# Patient Record
Sex: Female | Born: 1945 | Race: White | Hispanic: No | Smoking: Former smoker
Health system: Southern US, Community
[De-identification: ages and names within clinical notes are randomized; demographics above are authoritative.]

## PROBLEM LIST (undated history)

## (undated) DIAGNOSIS — I10 Essential (primary) hypertension: Secondary | ICD-10-CM

## (undated) DIAGNOSIS — E7800 Pure hypercholesterolemia, unspecified: Secondary | ICD-10-CM

## (undated) DIAGNOSIS — F329 Major depressive disorder, single episode, unspecified: Secondary | ICD-10-CM

## (undated) DIAGNOSIS — I482 Chronic atrial fibrillation, unspecified: Secondary | ICD-10-CM

## (undated) DIAGNOSIS — I639 Cerebral infarction, unspecified: Secondary | ICD-10-CM

## (undated) DIAGNOSIS — F32A Depression, unspecified: Secondary | ICD-10-CM

## (undated) HISTORY — PX: CHOLECYSTECTOMY: SHX55

## (undated) HISTORY — PX: OTHER SURGICAL HISTORY: SHX169

## (undated) HISTORY — PX: ABDOMINAL HYSTERECTOMY: SHX81

## (undated) HISTORY — PX: GASTRIC BYPASS: SHX52

---

## 2017-03-05 ENCOUNTER — Encounter (HOSPITAL_BASED_OUTPATIENT_CLINIC_OR_DEPARTMENT_OTHER): Payer: Self-pay | Admitting: *Deleted

## 2017-03-05 ENCOUNTER — Emergency Department (HOSPITAL_BASED_OUTPATIENT_CLINIC_OR_DEPARTMENT_OTHER): Payer: Medicare Other

## 2017-03-05 ENCOUNTER — Emergency Department (HOSPITAL_BASED_OUTPATIENT_CLINIC_OR_DEPARTMENT_OTHER)
Admission: EM | Admit: 2017-03-05 | Discharge: 2017-03-05 | Disposition: A | Discharge status: Home / Self Care | Payer: Medicare Other | Attending: Emergency Medicine | Admitting: Emergency Medicine

## 2017-03-05 DIAGNOSIS — S0232XA Fracture of orbital floor, left side, initial encounter for closed fracture: Secondary | ICD-10-CM | POA: Diagnosis not present

## 2017-03-05 DIAGNOSIS — W01198A Fall on same level from slipping, tripping and stumbling with subsequent striking against other object, initial encounter: Secondary | ICD-10-CM | POA: Diagnosis not present

## 2017-03-05 DIAGNOSIS — Y939 Activity, unspecified: Secondary | ICD-10-CM | POA: Diagnosis not present

## 2017-03-05 DIAGNOSIS — Y929 Unspecified place or not applicable: Secondary | ICD-10-CM | POA: Diagnosis not present

## 2017-03-05 DIAGNOSIS — I10 Essential (primary) hypertension: Secondary | ICD-10-CM | POA: Insufficient documentation

## 2017-03-05 DIAGNOSIS — Y999 Unspecified external cause status: Secondary | ICD-10-CM | POA: Diagnosis not present

## 2017-03-05 DIAGNOSIS — Z87891 Personal history of nicotine dependence: Secondary | ICD-10-CM | POA: Diagnosis not present

## 2017-03-05 DIAGNOSIS — Z79899 Other long term (current) drug therapy: Secondary | ICD-10-CM | POA: Diagnosis not present

## 2017-03-05 DIAGNOSIS — S0990XA Unspecified injury of head, initial encounter: Secondary | ICD-10-CM | POA: Diagnosis present

## 2017-03-05 HISTORY — DX: Pure hypercholesterolemia, unspecified: E78.00

## 2017-03-05 HISTORY — DX: Essential (primary) hypertension: I10

## 2017-03-05 HISTORY — DX: Major depressive disorder, single episode, unspecified: F32.9

## 2017-03-05 HISTORY — DX: Depression, unspecified: F32.A

## 2017-03-05 MED ORDER — CEPHALEXIN 500 MG PO CAPS: 500.0000 mg | ORAL_CAPSULE | Freq: Two times a day (BID) | ORAL | 0 refills | Status: DC

## 2017-03-05 NOTE — ED Notes (Signed)
CT called to have a disc ready for patient to follow up outside our MD's

## 2017-03-05 NOTE — ED Provider Notes (Signed)
[facility] Provider Note   CSN: [id number] Arrival date & time: 03/05/17  1840 By signing my name below, I, Elsie Stain, attest that this documentation has been prepared under the direction and in the presence of Gwyneth Sprout, MD . Electronically Signed: Elsie Stain, ED Scribe. 03/05/2017. 7:00 PM.  History   Chief Complaint Chief Complaint  Patient presents with  . Fall    HPI Adrienne Stone is a 71 y.o. female who presents to the Emergency Department complaining sudden onset, constant pain to left eye and left lateral forehead s/p mechanical fall day at 5 pm. Pt reports she fell face first 2 hours ago after tripping on threshold, striking her face on the linoleum floor with minimal resistance of her hands. She reports associated abrasion on L knee and swelling and ecchymosis to her L eye.  Pt treated swelling with frozen peas with no significant relief. Pt states she was able to ambulate after the fall without difficulty. Pt is not currently on anticoagulants. She dislocated her left shoulder four months ago but denies any pain in this area. Pt denies LOC vision changes, hip pain, ankle pain, neck pain, jaw pain or nose pain.   The history is provided by the patient. A language interpreter was used.   Past Medical History:  Diagnosis Date  . Depression   . Hypercholesteremia   . Hypertension     There are no active problems to display for this patient.   Past Surgical History:  Procedure Laterality Date  . ABDOMINAL HYSTERECTOMY    . CHOLECYSTECTOMY    . GASTRIC BYPASS    . thumb surgery      OB History    No data available       Home Medications    Prior to Admission medications   Not on File    Family History No family history on file.  Social History Social History  Substance Use Topics  . Smoking status: Former Games developer  . Smokeless tobacco: Never Used  . Alcohol use Yes     Comment: occ     Allergies   Demerol [meperidine];  Penicillins; Prozac [fluoxetine hcl]; Serzone [nefazodone]; and Sulfa antibiotics   Review of Systems Review of Systems All systems reviewed and are negative for acute change except as noted in the HPI.  Physical Exam Updated Vital Signs BP (!) 177/71 (BP Location: Right Arm)   Pulse 75   Temp 98.7 F (37.1 C) (Oral)   Resp 18   Ht 5\' 3"  (1.6 m)   Wt 257 lb (116.6 kg)   SpO2 98%   BMI 45.53 kg/m   Physical Exam  Constitutional: She is oriented to person, place, and time. She appears well-developed and well-nourished. No distress.  HENT:  Head: Normocephalic.  Large hematoma, ecchymosis and swelling over left forehead and supraorbital area. Pupils are 3mm and reactive bilaterally with extraoccular movements intact. No jaw pain. Bilateral TM's are normal.  Eyes: Conjunctivae are normal.  Cardiovascular: Normal rate, regular rhythm, normal heart sounds and intact distal pulses.   No murmur heard. Pulmonary/Chest: Effort normal and breath sounds normal. No respiratory distress. She has no wheezes. She has no rales.  Abdominal: She exhibits no distension.  Musculoskeletal:  No C-Spine tenderness. Pt is able to range bilateral knees and hips without pain. Ecchymosis on L knee.   Neurological: She is alert and oriented to person, place, and time.  Skin: Skin is warm and dry.  Nursing note and vitals reviewed.  ED Treatments / Results  DIAGNOSTIC STUDIES:  Oxygen Saturation is 98% on RA, normal by my interpretation.    COORDINATION OF CARE:  7:07 PM Will order CR head and CT maxillofacial. Discussed treatment plan with pt at bedside and pt agreed to plan.  Labs (all labs ordered are listed, but only abnormal results are displayed) Labs Reviewed - No data to display  EKG  EKG Interpretation None       Radiology No results found.  Procedures Procedures (including critical care time)  Medications Ordered in ED Medications - No data to display   Initial  Impression / Assessment and Plan / ED Course  I have reviewed the triage vital signs and the nursing notes.  Pertinent labs & imaging results that were available during my care of the patient were reviewed by me and considered in my medical decision making (see chart for details).    Patient is a 71 year old female with a mechanical fall today and significant swelling and bruising over the left side of the face and eye. She has no extraocular entrapment in vision is normal bilaterally. Imaging is consistent with a left orbital floor fracture with inferior displacement but no retrobulbar hematoma.  She has a negative head CT and no C-spine tenderness. Patient was given Keflex as her fracture extends into the maxillary sinus. She was given ENT follow-up.  No other injuries at this time.  Final Clinical Impressions(s) / ED Diagnoses   Final diagnoses:  Closed fracture of left orbital floor, initial encounter [facility](HCC)    New Prescriptions New Prescriptions   CEPHALEXIN (KEFLEX) 500 MG CAPSULE    Take 1 capsule (500 mg total) by mouth 2 (two) times daily.   I personally performed the services described in this documentation, which was scribed in my presence.  The recorded information has been reviewed and considered.     Gwyneth SproutPlunkett, Boen Sterbenz, MD 03/05/17 2056

## 2017-03-05 NOTE — ED Notes (Signed)
Patient returned from CT

## 2017-03-05 NOTE — ED Triage Notes (Signed)
Pt reports she tripped on the threshold of her doorway and fell onto her face. States incident occurred around 1700. Pt has hematoma to L eye and forehead and minimal abrasions to bil knees. Denies LOC, n/v. Denies taking blood thinner.

## 2017-03-05 NOTE — ED Notes (Signed)
Discharge instructions and prescription reviewed - voiced understanding.  Patient received disc of CT's

## 2017-03-05 NOTE — ED Notes (Signed)
ED Provider at bedside. 

## 2018-01-26 ENCOUNTER — Other Ambulatory Visit: Payer: Self-pay

## 2018-01-26 ENCOUNTER — Encounter (HOSPITAL_BASED_OUTPATIENT_CLINIC_OR_DEPARTMENT_OTHER): Payer: Self-pay | Admitting: *Deleted

## 2018-01-26 ENCOUNTER — Emergency Department (HOSPITAL_BASED_OUTPATIENT_CLINIC_OR_DEPARTMENT_OTHER)
Admission: EM | Admit: 2018-01-26 | Discharge: 2018-01-26 | Disposition: A | Discharge status: Home / Self Care | Payer: Medicare HMO | Attending: Emergency Medicine | Admitting: Emergency Medicine

## 2018-01-26 DIAGNOSIS — Z87891 Personal history of nicotine dependence: Secondary | ICD-10-CM | POA: Insufficient documentation

## 2018-01-26 DIAGNOSIS — Z79899 Other long term (current) drug therapy: Secondary | ICD-10-CM | POA: Diagnosis not present

## 2018-01-26 DIAGNOSIS — I10 Essential (primary) hypertension: Secondary | ICD-10-CM | POA: Diagnosis present

## 2018-01-26 DIAGNOSIS — I160 Hypertensive urgency: Secondary | ICD-10-CM | POA: Insufficient documentation

## 2018-01-26 NOTE — Discharge Instructions (Addendum)
Return to the ER or call 911 immediately if you develop severe headache, weakness or numbness on one side your body, chest pain, shortness of breath, vomiting, dizziness, blurry vision, or any other new/concerning symptoms.  Otherwise call your doctor tomorrow and follow-up as soon as possible in the next 1-2 days.  Go home and take your evening medicines immediately

## 2018-01-26 NOTE — ED Notes (Signed)
Pt. Reports she went to the dentist today at 1330 and had a tooth removed.  Pt. Reports her b/p was high before her tooth was removed and then after the extraction her B/P was even higher then it went down again and then the B/P was up and down.

## 2018-01-26 NOTE — ED Provider Notes (Signed)
[facility] EMERGENCY DEPARTMENT Provider Note   CSN: [id number] Arrival date & time: 01/26/18  1942     History   Chief Complaint Chief Complaint  Patient presents with  . Hypertension    HPI Adrienne Stone is a 72 y.o. female.  HPI  72 year old female with a history of hypertension, hypercholesterolemia and depression presents with hypertension.  She denies any symptoms including no headache, chest pain, blurry vision, dizziness, shortness of breath, weakness or numbness, or vomiting.  She states that she had a dental extraction today.  When she went to the dentist office her blood pressure was fluctuating between 180 and 210 systolic.  They went ahead with the procedure and afterwards her blood pressure was in the 170s and they sent her home.  However she checked it at home and it was still fluctuating between 170 and 210 systolic and ultimately was 192/92 so she came to the ER for evaluation.  She took her morning meds but has not taken her evening meds so far.  She states normally her blood pressure at her clinic visits are between 130-140 systolic.  She otherwise has not been ill. No bleeding or pain from the extraction.  Past Medical History:  Diagnosis Date  . Depression   . Hypercholesteremia   . Hypertension     There are no active problems to display for this patient.   Past Surgical History:  Procedure Laterality Date  . ABDOMINAL HYSTERECTOMY    . CHOLECYSTECTOMY    . GASTRIC BYPASS    . thumb surgery       OB History   None      Home Medications    Prior to Admission medications   Medication Sig Start Date End Date Taking? Authorizing Provider  venlafaxine (EFFEXOR) 50 MG tablet Take 50 mg by mouth 2 (two) times daily.   Yes [provider]  Ascorbic Acid (VITAMIN C) 1000 MG tablet Take 1,000 mg by mouth daily.    [provider]  Calcium Carbonate-Vit D-Min (CALCIUM 1200 PO) Take by mouth.    [provider]    cephALEXin (KEFLEX) 500 MG capsule Take 1 capsule (500 mg total) by mouth 2 (two) times daily. 03/05/17   Gwyneth SproutPlunkett, Whitney, MD  escitalopram (LEXAPRO) 10 MG tablet Take 10 mg by mouth daily.    [provider]  ferrous sulfate 325 (65 FE) MG tablet Take 325 mg by mouth daily with breakfast.    [provider]  hydrochlorothiazide (MICROZIDE) 12.5 MG capsule Take 12.5 mg by mouth daily.    [provider]  lisinopril (PRINIVIL,ZESTRIL) 40 MG tablet Take 40 mg by mouth daily.    [provider]  loratadine (CLARITIN) 10 MG tablet Take 10 mg by mouth daily.    [provider]  Magnesium 250 MG TABS Take by mouth.    [provider]  metoprolol tartrate (LOPRESSOR) 25 MG tablet Take 25 mg by mouth 2 (two) times daily.    [provider]  mirtazapine (REMERON) 45 MG tablet Take 45 mg by mouth at bedtime.    [provider]  Multiple Vitamin (MULTIVITAMIN) capsule Take 1 capsule by mouth daily.    [provider]  pantoprazole (PROTONIX) 40 MG tablet Take 40 mg by mouth daily.    [provider]  temazepam (RESTORIL) 30 MG capsule Take 30 mg by mouth at bedtime as needed for sleep.    [provider]    Family History  No family history on file.  Social History Social History   Tobacco Use  . Smoking status: Former Games developer  . Smokeless tobacco: Never Used  Substance Use Topics  . Alcohol use: Yes    Comment: occ  . Drug use: No     Allergies   Demerol [meperidine]; Penicillins; Prozac [fluoxetine hcl]; Serzone [nefazodone]; and Sulfa antibiotics   Review of Systems Review of Systems  HENT: Positive for dental problem.   Eyes: Negative for visual disturbance.  Respiratory: Negative for shortness of breath.   Cardiovascular: Negative for chest pain.  Gastrointestinal: Negative for abdominal pain, nausea and vomiting.  Genitourinary: Negative for decreased urine volume and difficulty  urinating.  Neurological: Negative for dizziness, weakness, numbness and headaches.  All other systems reviewed and are negative.    Physical Exam Updated Vital Signs BP (!) 212/99   Pulse 83   Temp 98.2 F (36.8 C) (Oral)   Resp 13   Ht 5\' 3"  (1.6 m)   Wt 118.4 kg (261 lb)   SpO2 96%   BMI 46.23 kg/m   Physical Exam  Constitutional: She is oriented to person, place, and time. She appears well-developed and well-nourished. No distress.  obese  HENT:  Head: Normocephalic and atraumatic.  Right Ear: External ear normal.  Left Ear: External ear normal.  Nose: Nose normal.  Mouth/Throat:    Eyes: Pupils are equal, round, and reactive to light. EOM are normal. Right eye exhibits no discharge. Left eye exhibits no discharge.  Neck: Neck supple.  Cardiovascular: Normal rate, regular rhythm and normal heart sounds.  Pulmonary/Chest: Effort normal and breath sounds normal.  Abdominal: Soft. There is no tenderness.  Neurological: She is alert and oriented to person, place, and time.  CN 3-12 grossly intact. 5/5 strength in all 4 extremities. LUE limited by prior shoulder injury. Grossly normal sensation. Normal finger to nose.   Skin: Skin is warm and dry.  Nursing note and vitals reviewed.    ED Treatments / Results  Labs (all labs ordered are listed, but only abnormal results are displayed) Labs Reviewed - No data to display  EKG EKG Interpretation  Date/Time:  Monday January 26 2018 20:22:41 EDT Ventricular Rate:  81 PR Interval:    QRS Duration: 93 QT Interval:  380 QTC Calculation: 442 R Axis:   25 Text Interpretation:  Sinus rhythm Anteroseptal infarct, old no acute ST/T changes No old tracing to compare Confirmed by Pricilla Loveless [phone]) on 01/26/2018 8:59:33 PM   Radiology No results found.  Procedures Procedures (including critical care time)  Medications Ordered in ED Medications - No data to display   Initial Impression / Assessment and Plan / ED  Course  I have reviewed the triage vital signs and the nursing notes.  Pertinent labs & imaging results that were available during my care of the patient were reviewed by me and considered in my medical decision making (see chart for details).     Patient presents with asymptomatic hypertensive urgency.  At this point, her blood pressure is significantly elevated but since she is asymptomatic I do not think dramatic lowering would be helpful.  I have encouraged her to take her home BP meds that she has not taken so far tonight.  She states she will wait at home instead of getting her meds here.  I have discussed strict return precautions but at this point she needs outpatient follow-up with better long-term blood pressure control.  We discussed potential downsides of immediate  blood pressure lowering and agreed to hold off and see PCP in the next day or 2.  Otherwise we discussed strict return precautions.  Final Clinical Impressions(s) / ED Diagnoses   Final diagnoses:  Hypertensive urgency    ED Discharge Orders    None       Pricilla Loveless, MD 01/27/18 0020

## 2018-01-26 NOTE — ED Triage Notes (Signed)
She was seen by her dentist today and had a tooth pulled 5 hours ago. She was HTN prior to having the tooth pulled and HTN afterward. She was told to come here. Asymptomatic.

## 2018-11-04 ENCOUNTER — Other Ambulatory Visit: Payer: Self-pay

## 2018-11-04 ENCOUNTER — Inpatient Hospital Stay (HOSPITAL_BASED_OUTPATIENT_CLINIC_OR_DEPARTMENT_OTHER)
Admission: EM | Admit: 2018-11-04 | Discharge: 2018-11-19 | DRG: 871 | Disposition: A | Discharge status: Skilled Nursing Facility | Payer: Medicare HMO | Attending: Internal Medicine | Admitting: Internal Medicine

## 2018-11-04 ENCOUNTER — Emergency Department (HOSPITAL_BASED_OUTPATIENT_CLINIC_OR_DEPARTMENT_OTHER): Payer: Medicare HMO

## 2018-11-04 ENCOUNTER — Other Ambulatory Visit (HOSPITAL_BASED_OUTPATIENT_CLINIC_OR_DEPARTMENT_OTHER): Payer: Self-pay

## 2018-11-04 ENCOUNTER — Encounter (HOSPITAL_BASED_OUTPATIENT_CLINIC_OR_DEPARTMENT_OTHER): Payer: Self-pay

## 2018-11-04 DIAGNOSIS — F419 Anxiety disorder, unspecified: Secondary | ICD-10-CM | POA: Diagnosis present

## 2018-11-04 DIAGNOSIS — E871 Hypo-osmolality and hyponatremia: Secondary | ICD-10-CM | POA: Diagnosis not present

## 2018-11-04 DIAGNOSIS — R0682 Tachypnea, not elsewhere classified: Secondary | ICD-10-CM | POA: Diagnosis not present

## 2018-11-04 DIAGNOSIS — F329 Major depressive disorder, single episode, unspecified: Secondary | ICD-10-CM | POA: Diagnosis not present

## 2018-11-04 DIAGNOSIS — A4189 Other specified sepsis: Principal | ICD-10-CM | POA: Diagnosis present

## 2018-11-04 DIAGNOSIS — R42 Dizziness and giddiness: Secondary | ICD-10-CM | POA: Diagnosis not present

## 2018-11-04 DIAGNOSIS — J101 Influenza due to other identified influenza virus with other respiratory manifestations: Secondary | ICD-10-CM | POA: Diagnosis present

## 2018-11-04 DIAGNOSIS — N289 Disorder of kidney and ureter, unspecified: Secondary | ICD-10-CM

## 2018-11-04 DIAGNOSIS — R0603 Acute respiratory distress: Secondary | ICD-10-CM

## 2018-11-04 DIAGNOSIS — I160 Hypertensive urgency: Secondary | ICD-10-CM | POA: Diagnosis not present

## 2018-11-04 DIAGNOSIS — E876 Hypokalemia: Secondary | ICD-10-CM | POA: Diagnosis not present

## 2018-11-04 DIAGNOSIS — Z6841 Body Mass Index (BMI) 40.0 and over, adult: Secondary | ICD-10-CM | POA: Diagnosis not present

## 2018-11-04 DIAGNOSIS — J9601 Acute respiratory failure with hypoxia: Secondary | ICD-10-CM | POA: Diagnosis present

## 2018-11-04 DIAGNOSIS — Z9884 Bariatric surgery status: Secondary | ICD-10-CM | POA: Diagnosis not present

## 2018-11-04 DIAGNOSIS — I10 Essential (primary) hypertension: Secondary | ICD-10-CM | POA: Diagnosis not present

## 2018-11-04 DIAGNOSIS — I480 Paroxysmal atrial fibrillation: Secondary | ICD-10-CM | POA: Diagnosis not present

## 2018-11-04 DIAGNOSIS — R69 Illness, unspecified: Secondary | ICD-10-CM

## 2018-11-04 DIAGNOSIS — Z888 Allergy status to other drugs, medicaments and biological substances status: Secondary | ICD-10-CM

## 2018-11-04 DIAGNOSIS — Z882 Allergy status to sulfonamides status: Secondary | ICD-10-CM

## 2018-11-04 DIAGNOSIS — E7800 Pure hypercholesterolemia, unspecified: Secondary | ICD-10-CM | POA: Diagnosis not present

## 2018-11-04 DIAGNOSIS — Z87891 Personal history of nicotine dependence: Secondary | ICD-10-CM | POA: Diagnosis not present

## 2018-11-04 DIAGNOSIS — E785 Hyperlipidemia, unspecified: Secondary | ICD-10-CM | POA: Diagnosis not present

## 2018-11-04 DIAGNOSIS — K254 Chronic or unspecified gastric ulcer with hemorrhage: Secondary | ICD-10-CM | POA: Diagnosis not present

## 2018-11-04 DIAGNOSIS — Z66 Do not resuscitate: Secondary | ICD-10-CM | POA: Diagnosis present

## 2018-11-04 DIAGNOSIS — B9789 Other viral agents as the cause of diseases classified elsewhere: Secondary | ICD-10-CM | POA: Diagnosis present

## 2018-11-04 DIAGNOSIS — Z880 Allergy status to penicillin: Secondary | ICD-10-CM

## 2018-11-04 DIAGNOSIS — Z79899 Other long term (current) drug therapy: Secondary | ICD-10-CM

## 2018-11-04 DIAGNOSIS — K921 Melena: Secondary | ICD-10-CM

## 2018-11-04 DIAGNOSIS — R059 Cough, unspecified: Secondary | ICD-10-CM

## 2018-11-04 DIAGNOSIS — D62 Acute posthemorrhagic anemia: Secondary | ICD-10-CM | POA: Diagnosis not present

## 2018-11-04 DIAGNOSIS — D649 Anemia, unspecified: Secondary | ICD-10-CM

## 2018-11-04 DIAGNOSIS — I4891 Unspecified atrial fibrillation: Secondary | ICD-10-CM

## 2018-11-04 DIAGNOSIS — R05 Cough: Secondary | ICD-10-CM

## 2018-11-04 DIAGNOSIS — J111 Influenza due to unidentified influenza virus with other respiratory manifestations: Secondary | ICD-10-CM

## 2018-11-04 DIAGNOSIS — J09X2 Influenza due to identified novel influenza A virus with other respiratory manifestations: Secondary | ICD-10-CM | POA: Diagnosis not present

## 2018-11-04 LAB — CBC WITH DIFFERENTIAL/PLATELET
ABS IMMATURE GRANULOCYTES: 0.01 10*3/uL (ref 0.00–0.07)
Basophils Absolute: 0 10*3/uL (ref 0.0–0.1)
Basophils Relative: 0 %
Eosinophils Absolute: 0.1 10*3/uL (ref 0.0–0.5)
Eosinophils Relative: 2 %
HCT: 34.2 % — ABNORMAL LOW (ref 36.0–46.0)
Hemoglobin: 10.6 g/dL — ABNORMAL LOW (ref 12.0–15.0)
IMMATURE GRANULOCYTES: 0 %
Lymphocytes Relative: 13 %
Lymphs Abs: 0.7 10*3/uL (ref 0.7–4.0)
MCH: 29.2 pg (ref 26.0–34.0)
MCHC: 31 g/dL (ref 30.0–36.0)
MCV: 94.2 fL (ref 80.0–100.0)
Monocytes Absolute: 0.7 10*3/uL (ref 0.1–1.0)
Monocytes Relative: 14 %
NEUTROS ABS: 3.7 10*3/uL (ref 1.7–7.7)
NEUTROS PCT: 71 %
NRBC: 0 % (ref 0.0–0.2)
PLATELETS: 200 10*3/uL (ref 150–400)
RBC: 3.63 MIL/uL — AB (ref 3.87–5.11)
RDW: 13.5 % (ref 11.5–15.5)
WBC: 5.2 10*3/uL (ref 4.0–10.5)

## 2018-11-04 LAB — COMPREHENSIVE METABOLIC PANEL
ALT: 19 U/L (ref 0–44)
AST: 30 U/L (ref 15–41)
Albumin: 4 g/dL (ref 3.5–5.0)
Alkaline Phosphatase: 60 U/L (ref 38–126)
Anion gap: 11 (ref 5–15)
BUN: 22 mg/dL (ref 8–23)
CHLORIDE: 107 mmol/L (ref 98–111)
CO2: 19 mmol/L — AB (ref 22–32)
Calcium: 8.8 mg/dL — ABNORMAL LOW (ref 8.9–10.3)
Creatinine, Ser: 1.03 mg/dL — ABNORMAL HIGH (ref 0.44–1.00)
GFR calc non Af Amer: 54 mL/min — ABNORMAL LOW (ref >60)
Glucose, Bld: 115 mg/dL — ABNORMAL HIGH (ref 70–99)
Potassium: 3.6 mmol/L (ref 3.5–5.1)
SODIUM: 137 mmol/L (ref 135–145)
Total Bilirubin: 0.5 mg/dL (ref 0.3–1.2)
Total Protein: 7.2 g/dL (ref 6.5–8.1)

## 2018-11-04 LAB — POCT I-STAT 7, (LYTES, BLD GAS, ICA,H+H)
Acid-base deficit: 3 mmol/L — ABNORMAL HIGH (ref 0.0–2.0)
Bicarbonate: 21.9 mmol/L (ref 20.0–28.0)
Calcium, Ion: 1.25 mmol/L (ref 1.15–1.40)
HCT: 30 % — ABNORMAL LOW (ref 36.0–46.0)
Hemoglobin: 10.2 g/dL — ABNORMAL LOW (ref 12.0–15.0)
O2 Saturation: 99 %
Patient temperature: 99.4
Potassium: 4.2 mmol/L (ref 3.5–5.1)
Sodium: 139 mmol/L (ref 135–145)
TCO2: 23 mmol/L (ref 22–32)
pCO2 arterial: 39.4 mmHg (ref 32.0–48.0)
pH, Arterial: 7.355 (ref 7.350–7.450)
pO2, Arterial: 141 mmHg — ABNORMAL HIGH (ref 83.0–108.0)

## 2018-11-04 LAB — URINALYSIS, ROUTINE W REFLEX MICROSCOPIC
Bilirubin Urine: NEGATIVE
Glucose, UA: NEGATIVE mg/dL
Hgb urine dipstick: NEGATIVE
Ketones, ur: NEGATIVE mg/dL
Leukocytes, UA: NEGATIVE
Nitrite: NEGATIVE
PH: 5 (ref 5.0–8.0)
Protein, ur: NEGATIVE mg/dL
Specific Gravity, Urine: 1.015 (ref 1.005–1.030)

## 2018-11-04 LAB — POCT I-STAT EG7
Acid-base deficit: 6 mmol/L — ABNORMAL HIGH (ref 0.0–2.0)
Bicarbonate: 20.5 mmol/L (ref 20.0–28.0)
Calcium, Ion: 1.24 mmol/L (ref 1.15–1.40)
HCT: 33 % — ABNORMAL LOW (ref 36.0–46.0)
Hemoglobin: 11.2 g/dL — ABNORMAL LOW (ref 12.0–15.0)
O2 Saturation: 71 %
Patient temperature: 100
Potassium: 3.9 mmol/L (ref 3.5–5.1)
SODIUM: 141 mmol/L (ref 135–145)
TCO2: 22 mmol/L (ref 22–32)
pCO2, Ven: 43.2 mmHg — ABNORMAL LOW (ref 44.0–60.0)
pH, Ven: 7.287 (ref 7.250–7.430)
pO2, Ven: 43 mmHg (ref 32.0–45.0)

## 2018-11-04 LAB — INFLUENZA PANEL BY PCR (TYPE A & B)
Influenza A By PCR: POSITIVE — AB
Influenza B By PCR: NEGATIVE

## 2018-11-04 LAB — LACTIC ACID, PLASMA: Lactic Acid, Venous: 1.5 mmol/L (ref 0.5–1.9)

## 2018-11-04 LAB — TROPONIN I: Troponin I: <0.03 ng/mL (ref <0.03)

## 2018-11-04 MED ORDER — IPRATROPIUM-ALBUTEROL 0.5-2.5 (3) MG/3ML IN SOLN
RESPIRATORY_TRACT | Status: AC
  Filled 2018-11-04: qty 3

## 2018-11-04 MED ORDER — IPRATROPIUM-ALBUTEROL 0.5-2.5 (3) MG/3ML IN SOLN
3.0000 mL | Freq: Once | RESPIRATORY_TRACT | Status: AC
  Administered 2018-11-04: 3 mL via RESPIRATORY_TRACT

## 2018-11-04 MED ORDER — LIDOCAINE VISCOUS HCL 2 % MT SOLN
15.0000 mL | Freq: Once | OROMUCOSAL | Status: AC
  Administered 2018-11-04: 15 mL via OROMUCOSAL
  Filled 2018-11-04: qty 15

## 2018-11-04 MED ORDER — PREDNISONE 50 MG PO TABS
60.0000 mg | ORAL_TABLET | Freq: Once | ORAL | Status: AC
  Administered 2018-11-04: 60 mg via ORAL
  Filled 2018-11-04: qty 1

## 2018-11-04 MED ORDER — IOPAMIDOL (ISOVUE-370) INJECTION 76%
100.0000 mL | Freq: Once | INTRAVENOUS | Status: AC | PRN
  Administered 2018-11-04: 100 mL via INTRAVENOUS

## 2018-11-04 MED ORDER — ALBUTEROL SULFATE (2.5 MG/3ML) 0.083% IN NEBU
5.0000 mg | INHALATION_SOLUTION | Freq: Once | RESPIRATORY_TRACT | Status: AC
  Administered 2018-11-04: 5 mg via RESPIRATORY_TRACT
  Filled 2018-11-04: qty 6

## 2018-11-04 MED ORDER — CEFTRIAXONE SODIUM 1 G IJ SOLR: 50.0000 mg/kg | Freq: Once | INTRAMUSCULAR | Status: DC

## 2018-11-04 MED ORDER — SODIUM CHLORIDE 0.9 % IV BOLUS (SEPSIS)
500.0000 mL | Freq: Once | INTRAVENOUS | Status: AC
  Administered 2018-11-04: 500 mL via INTRAVENOUS

## 2018-11-04 MED ORDER — AZITHROMYCIN 250 MG PO TABS
500.0000 mg | ORAL_TABLET | Freq: Once | ORAL | Status: AC
  Administered 2018-11-04: 500 mg via ORAL
  Filled 2018-11-04: qty 2

## 2018-11-04 MED ORDER — ACETAMINOPHEN 325 MG PO TABS
650.0000 mg | ORAL_TABLET | Freq: Once | ORAL | Status: AC
  Administered 2018-11-04: 650 mg via ORAL
  Filled 2018-11-04: qty 2

## 2018-11-04 MED ORDER — SODIUM CHLORIDE 0.9 % IV SOLN
1.0000 g | Freq: Once | INTRAVENOUS | Status: AC
  Administered 2018-11-04: 1 g via INTRAVENOUS
  Filled 2018-11-04: qty 10

## 2018-11-04 MED ORDER — ALBUTEROL SULFATE (2.5 MG/3ML) 0.083% IN NEBU
INHALATION_SOLUTION | RESPIRATORY_TRACT | Status: AC
  Administered 2018-11-04: 2.5 mg
  Filled 2018-11-04: qty 3

## 2018-11-04 MED ORDER — METOPROLOL TARTRATE 5 MG/5ML IV SOLN
10.0000 mg | Freq: Once | INTRAVENOUS | Status: AC
  Administered 2018-11-04: 10 mg via INTRAVENOUS
  Filled 2018-11-04: qty 10

## 2018-11-04 NOTE — ED Provider Notes (Addendum)
[facility] EMERGENCY DEPARTMENT Provider Note   CSN: [id number] Arrival date & time: 11/04/18  1504   History   Chief Complaint Chief Complaint  Patient presents with  . Cough    HPI Adrienne Stone is a 73 y.o. female.  Patient reports that last night she started having cough with trouble breathing.  States that she began to be extremely short of breath.  Her sister brought her in today because she seemed to be working hard to breathe and was coughing a lot more.  Patient denies any fevers, but does report some chills.  Patient does not have any history of asthma/ COPD or CHF.  Patient reports that she at baseline has some leg swelling as she is immobile in her home.  Patient does endorse that she has had a headache today.  Denies any body aches or dizziness.  States she noticed that her back started hurting but thought this was due to her cough.    Past Medical History:  Diagnosis Date  . Depression   . Hypercholesteremia   . Hypertension     There are no active problems to display for this patient.   Past Surgical History:  Procedure Laterality Date  . ABDOMINAL HYSTERECTOMY    . CHOLECYSTECTOMY    . GASTRIC BYPASS    . thumb surgery       OB History   No obstetric history on file.     Home Medications    Prior to Admission medications   Medication Sig Start Date End Date Taking? Authorizing Provider  Ascorbic Acid (VITAMIN C) 1000 MG tablet Take 1,000 mg by mouth daily.    [provider]  Calcium Carbonate-Vit D-Min (CALCIUM 1200 PO) Take by mouth.    [provider]  cephALEXin (KEFLEX) 500 MG capsule Take 1 capsule (500 mg total) by mouth 2 (two) times daily. 03/05/17   Gwyneth Sprout, MD  escitalopram (LEXAPRO) 10 MG tablet Take 10 mg by mouth daily.    [provider]  ferrous sulfate 325 (65 FE) MG tablet Take 325 mg by mouth daily with breakfast.    [provider]  hydrochlorothiazide (MICROZIDE) 12.5  MG capsule Take 12.5 mg by mouth daily.    [provider]  lisinopril (PRINIVIL,ZESTRIL) 40 MG tablet Take 40 mg by mouth daily.    [provider]  loratadine (CLARITIN) 10 MG tablet Take 10 mg by mouth daily.    [provider]  Magnesium 250 MG TABS Take by mouth.    [provider]  metoprolol tartrate (LOPRESSOR) 25 MG tablet Take 25 mg by mouth 2 (two) times daily.    [provider]  mirtazapine (REMERON) 45 MG tablet Take 45 mg by mouth at bedtime.    [provider]  Multiple Vitamin (MULTIVITAMIN) capsule Take 1 capsule by mouth daily.    [provider]  pantoprazole (PROTONIX) 40 MG tablet Take 40 mg by mouth daily.    [provider]  temazepam (RESTORIL) 30 MG capsule Take 30 mg by mouth at bedtime as needed for sleep.    [provider]  venlafaxine (EFFEXOR) 50 MG tablet Take 50 mg by mouth 2 (two) times daily.    [provider]    Family History No family history on file.  Social History Social History   Tobacco Use  . Smoking status: Former Games developer  . Smokeless tobacco: Never Used  Substance Use Topics  . Alcohol use: Yes  Comment: daily  . Drug use: No    Allergies   Demerol [meperidine]; Penicillins; Prozac [fluoxetine hcl]; Serzone [nefazodone]; and Sulfa antibiotics   Review of Systems Review of Systems  Constitutional: Positive for chills. Negative for fever.  HENT: Positive for rhinorrhea. Negative for congestion.   Respiratory: Positive for cough and shortness of breath. Negative for chest tightness and wheezing.   Cardiovascular: Positive for leg swelling. Negative for chest pain.  Gastrointestinal: Negative for diarrhea, rectal pain and vomiting.  Musculoskeletal: Positive for back pain. Negative for arthralgias.  Neurological: Positive for headaches. Negative for dizziness.  All other systems reviewed and are negative.   Physical Exam Updated Vital  Signs BP (!) 188/87   Pulse 100   Temp 100 F (37.8 C) (Rectal)   Resp (!) 26   SpO2 100%   Physical Exam Vitals signs and nursing note reviewed.  Constitutional:      Appearance: Normal appearance. She is obese. She is ill-appearing.  HENT:     Head: Normocephalic and atraumatic.     Nose: Nose normal. No rhinorrhea.     Mouth/Throat:     Mouth: Mucous membranes are moist.     Pharynx: Oropharynx is clear. No posterior oropharyngeal erythema.  Eyes:     Extraocular Movements: Extraocular movements intact.     Conjunctiva/sclera: Conjunctivae normal.     Pupils: Pupils are equal, round, and reactive to light.  Neck:     Musculoskeletal: Normal range of motion and neck supple.  Cardiovascular:     Rate and Rhythm: Normal rate and regular rhythm.     Heart sounds: Normal heart sounds.  Pulmonary:     Effort: Tachypnea, accessory muscle usage and prolonged expiration present.     Breath sounds: Decreased air movement present. Wheezing present.  Abdominal:     General: Bowel sounds are normal. There is no distension.     Palpations: Abdomen is soft.     Tenderness: There is no abdominal tenderness.  Musculoskeletal:     Right lower leg: 1+ Edema present.     Left lower leg: 2+ Edema present.  Lymphadenopathy:     Cervical: No cervical adenopathy.  Skin:    General: Skin is warm.     Capillary Refill: Capillary refill takes 2 to 3 seconds.  Neurological:     General: No focal deficit present.     Mental Status: She is alert. Mental status is at baseline.  Psychiatric:        Mood and Affect: Mood normal.        Behavior: Behavior normal.        Thought Content: Thought content normal.        Judgment: Judgment normal.    ED Treatments / Results  Labs (all labs ordered are listed, but only abnormal results are displayed) Labs Reviewed  CBC WITH DIFFERENTIAL/PLATELET - Abnormal; Notable for the following components:      Result Value   RBC 3.63 (*)    Hemoglobin  10.6 (*)    HCT 34.2 (*)    All other components within normal limits  COMPREHENSIVE METABOLIC PANEL - Abnormal; Notable for the following components:   CO2 19 (*)    Glucose, Bld 115 (*)    Creatinine, Ser 1.03 (*)    Calcium 8.8 (*)    GFR calc non Af Amer 54 (*)    All other components within normal limits  POCT I-STAT EG7 - Abnormal; Notable for the following components:  pCO2, Ven 43.2 (*)    Acid-base deficit 6.0 (*)    HCT 33.0 (*)    Hemoglobin 11.2 (*)    All other components within normal limits  POCT I-STAT 7, (LYTES, BLD GAS, ICA,H+H) - Abnormal; Notable for the following components:   pO2, Arterial 141.0 (*)    Acid-base deficit 3.0 (*)    HCT 30.0 (*)    Hemoglobin 10.2 (*)    All other components within normal limits  CULTURE, BLOOD (ROUTINE X 2)  CULTURE, BLOOD (ROUTINE X 2)  TROPONIN I  LACTIC ACID, PLASMA  URINALYSIS, ROUTINE W REFLEX MICROSCOPIC  LACTIC ACID, PLASMA  INFLUENZA PANEL BY PCR (TYPE A & B)  I-STAT VENOUS BLOOD GAS, ED  I-STAT ARTERIAL BLOOD GAS, ED    EKG None  Radiology Dg Chest 2 View  Result Date: 11/04/2018 CLINICAL DATA:  Site cough and flu-like symptoms for 24 hours. EXAM: CHEST - 2 VIEW COMPARISON:  None. FINDINGS: The heart size and mediastinal contours are within normal limits. Coarse prominence of interstitial lung markings is consistent with chronic interstitial disease. No evidence of acute infiltrate or pleural effusion. Eventration of the anterior right hemidiaphragm is noted. Thoracic spine degenerative changes are noted as well as wedge compression fracture deformity of the L1 vertebral body. IMPRESSION: No active cardiopulmonary disease. Electronically Signed   By: Myles RosenthalJohn  Stahl M.D.   On: 11/04/2018 15:41   Ct Angio Chest Pe W/cm &/or Wo Cm  Result Date: 11/04/2018 CLINICAL DATA:  Shortness of breath EXAM: CT ANGIOGRAPHY CHEST WITH CONTRAST TECHNIQUE: Multidetector CT imaging of the chest was performed using the standard  protocol during bolus administration of intravenous contrast. Multiplanar CT image reconstructions and MIPs were obtained to evaluate the vascular anatomy. CONTRAST:  100mL ISOVUE-370 IOPAMIDOL (ISOVUE-370) INJECTION 76% COMPARISON:  None. FINDINGS: Cardiovascular: Heart is borderline in size. Aorta is normal caliber. No filling defects in the pulmonary arteries to suggest pulmonary emboli. Scattered coronary artery and aortic calcifications. Mediastinum/Nodes: Borderline sized mediastinal lymph nodes. No hilar or axillary adenopathy. Lungs/Pleura: Scattered ground-glass airspace opacities within the lungs. Favor inflammatory or infectious pneumonitis. No effusions. Upper Abdomen: Imaging into the upper abdomen shows no acute findings. Musculoskeletal: There are collateral venous channels throughout the left chest wall suggesting central venous stenosis, not visible by CT. Chest wall soft tissues otherwise unremarkable. No acute bony abnormality. Review of the MIP images confirms the above findings. IMPRESSION: No evidence of pulmonary embolus. Patchy bilateral ground-glass opacities in the lungs, most pronounced in the upper lobes. Favor infectious or inflammatory pneumonitis. Borderline heart size. Borderline scattered mediastinal lymph nodes, likely reactive. Coronary artery disease. Aortic Atherosclerosis (ICD10-I70.0). Electronically Signed   By: Charlett NoseKevin  Dover M.D.   On: 11/04/2018 21:37    Procedures Procedures (including critical care time)  Medications Ordered in ED Medications  albuterol (PROVENTIL) (2.5 MG/3ML) 0.083% nebulizer solution 5 mg (5 mg Nebulization Given 11/04/18 1622)  sodium chloride 0.9 % bolus 500 mL (0 mLs Intravenous Stopped 11/04/18 1947)  acetaminophen (TYLENOL) tablet 650 mg (650 mg Oral Given 11/04/18 1729)  lidocaine (XYLOCAINE) 2 % viscous mouth solution 15 mL (15 mLs Mouth/Throat Given 11/04/18 1804)  ipratropium-albuterol (DUONEB) 0.5-2.5 (3) MG/3ML nebulizer solution 3 mL  (3 mLs Nebulization Given 11/04/18 1832)  predniSONE (DELTASONE) tablet 60 mg (60 mg Oral Given 11/04/18 1834)  albuterol (PROVENTIL) (2.5 MG/3ML) 0.083% nebulizer solution (2.5 mg  Given 11/04/18 1832)  azithromycin (ZITHROMAX) tablet 500 mg (500 mg Oral Given 11/04/18 1905)  cefTRIAXone (ROCEPHIN) 1  g in sodium chloride 0.9 % 100 mL IVPB (0 g Intravenous Stopped 11/04/18 1950)  iopamidol (ISOVUE-370) 76 % injection 100 mL (100 mLs Intravenous Contrast Given 11/04/18 2130)  metoprolol tartrate (LOPRESSOR) injection 10 mg (10 mg Intravenous Given 11/04/18 1947)    ED ECG REPORT  Date: 11/04/2018  Rate: 124  Rhythm: sinus tachycardia  QRS Axis: normal  Intervals: normal  ST/T Wave abnormalities: normal  Conduction Disutrbances:none  Narrative Interpretation:   Old EKG Reviewed: unchanged I have personally reviewed the EKG tracing and agree with the computerized printout as noted. Initial Impression / Assessment and Plan / ED Course  I have reviewed the triage vital signs and the nursing notes.  Pertinent labs & imaging results that were available during my care of the patient were reviewed by me and considered in my medical decision making (see chart for details).   73 year old female with PMH of hypertension, hyperlipidemia, anxiety and morbid obesity who is coming into the ED today for increased work of breathing and cough.  Could be secondary to pneumonia vs influenza vs new asthma exacerbation vs new CHF exacerbation. Initially on exam patient using accessory muscles and working very hard to breathe and is only able to speak in 2-3 word sentences.  Expiratory wheeze noted on exam with decreased air movement.  CXR revealed no signs of pneumonia.  Given albuterol neb x1 with no improvement.   Patient to receive DuoNeb and be placed on supplemental oxygen for increased work of breathing.  Attending in room and given patient's presentation code sepsis was called.  Patient only given 500 mL's of  fluid given that she has lower extremity edema and no recent echo on file.  Patient given Tylenol for comfort.  Lactic acid, blood culture x2, UA, CMP, CBC with differential, troponin all obtained. Patient may not benefit from BNP given body habitus and no previous BNP on file.   Lactic acid 1.5, UA with no signs of infection, CMP within normal limits, CBC with differential with no elevated white count and troponin within normal limits.  Patient does not meet sepsis criteria, but given how patient appears will start CTX and azithro.  Influenza PCR obtained given that patient symptoms acutely started yesterday it is likely related to viral illness such as influenza.  On re-evaluation patient continues to use accessory muscles and is only able to speak in 2-3 word sentences and is requiring to sit up in bed in order to help with breathing.  Patient now tachycardic but this is likely due to albuterol treatments. However patient has yet to be placed on supplemental oxygen or receive DuoNeb treatment.  Patient to receive DuoNeb treatment as well as prednisone 60 mg, but given patient's continued increased work of breathing, patient will need admission to hospital given comorbidities.  Have paged [facility] for admission to hospital.  CTA ordered to rule out PE given sudden onset of symptoms, however less likely given presentation and wheezing noted on exam.  Patient with continued increased work of breathing and started on BiPAP for work of breathing.  While in room noted that patient appears to now be in atrial fibrillation with RVR.  Patient reports that she has no history of this.  Will give Lopressor 10 mg IV. Patient states that she is DNR/DNI.   Patient no longer in atrial fibrillation after Lopressor and rate improved to 102 as well as blood pressure has improved.  CTA chest is negative for PE patchy bilateral ground-glass opacities in  upper lobes suspicious for infectious or inflammatory  pneumonitis.  Patient remains improved on BiPAP.  Spoke with Dr. Loney Loh who recommended ABG prior to admission at [facility] evaluate patient's status after being on BiPAP.  ABG shows 7.362/38.7/138/21.9.  Spoke with Dr. Loney Loh over the phone who will place bed request.  Patient remains stable on BiPAP and is safe for transport to stepdown at [facility].  [place] Declin Rajan, DO PGY-2, [facility]   Final Clinical Impressions(s) / ED Diagnoses   Final diagnoses:  Cough  Influenza-like illness  Tachypnea    ED Discharge Orders    None       Ruford Dudzinski, [place], DO 11/04/18 2211    Yossef Gilkison, [place], DO 11/04/18 2231    Gwyneth Sprout, MD 11/04/18 2330

## 2018-11-04 NOTE — ED Triage Notes (Signed)
C/o flu like sx x 24 hours-pt to triage in w/c-NAD

## 2018-11-04 NOTE — ED Notes (Signed)
ED Provider at bedside. 

## 2018-11-04 NOTE — Progress Notes (Signed)
Patient's WOB and SOB has decreased while on BiPAP.  Patient tolerating well.

## 2018-11-04 NOTE — Progress Notes (Signed)
ABG done while patient was on BiPAP.

## 2018-11-04 NOTE — ED Notes (Signed)
EDP made aware that patient has no antibiotic ordered for code sepsis.  EDP is waiting for all the specimen to be collected before she will order ABT.

## 2018-11-04 NOTE — ED Notes (Signed)
Patient transported to CT with RT and RN  

## 2018-11-04 NOTE — ED Notes (Signed)
[facility] notified (Kim) - hospitalist consult 

## 2018-11-05 ENCOUNTER — Other Ambulatory Visit: Payer: Self-pay

## 2018-11-05 ENCOUNTER — Encounter (HOSPITAL_COMMUNITY): Payer: Self-pay | Admitting: Family Medicine

## 2018-11-05 DIAGNOSIS — R0603 Acute respiratory distress: Secondary | ICD-10-CM | POA: Diagnosis present

## 2018-11-05 DIAGNOSIS — K921 Melena: Secondary | ICD-10-CM | POA: Diagnosis not present

## 2018-11-05 DIAGNOSIS — Z882 Allergy status to sulfonamides status: Secondary | ICD-10-CM | POA: Diagnosis not present

## 2018-11-05 DIAGNOSIS — R0682 Tachypnea, not elsewhere classified: Secondary | ICD-10-CM | POA: Diagnosis present

## 2018-11-05 DIAGNOSIS — E871 Hypo-osmolality and hyponatremia: Secondary | ICD-10-CM | POA: Diagnosis not present

## 2018-11-05 DIAGNOSIS — Z9884 Bariatric surgery status: Secondary | ICD-10-CM | POA: Diagnosis not present

## 2018-11-05 DIAGNOSIS — Z87891 Personal history of nicotine dependence: Secondary | ICD-10-CM | POA: Diagnosis not present

## 2018-11-05 DIAGNOSIS — Z880 Allergy status to penicillin: Secondary | ICD-10-CM | POA: Diagnosis not present

## 2018-11-05 DIAGNOSIS — E7800 Pure hypercholesterolemia, unspecified: Secondary | ICD-10-CM | POA: Diagnosis present

## 2018-11-05 DIAGNOSIS — D62 Acute posthemorrhagic anemia: Secondary | ICD-10-CM | POA: Diagnosis not present

## 2018-11-05 DIAGNOSIS — D649 Anemia, unspecified: Secondary | ICD-10-CM

## 2018-11-05 DIAGNOSIS — I160 Hypertensive urgency: Secondary | ICD-10-CM | POA: Diagnosis present

## 2018-11-05 DIAGNOSIS — J9601 Acute respiratory failure with hypoxia: Secondary | ICD-10-CM | POA: Diagnosis present

## 2018-11-05 DIAGNOSIS — F329 Major depressive disorder, single episode, unspecified: Secondary | ICD-10-CM | POA: Diagnosis present

## 2018-11-05 DIAGNOSIS — Z888 Allergy status to other drugs, medicaments and biological substances status: Secondary | ICD-10-CM | POA: Diagnosis not present

## 2018-11-05 DIAGNOSIS — A4189 Other specified sepsis: Secondary | ICD-10-CM | POA: Diagnosis present

## 2018-11-05 DIAGNOSIS — J101 Influenza due to other identified influenza virus with other respiratory manifestations: Secondary | ICD-10-CM | POA: Diagnosis present

## 2018-11-05 DIAGNOSIS — F419 Anxiety disorder, unspecified: Secondary | ICD-10-CM | POA: Diagnosis present

## 2018-11-05 DIAGNOSIS — N289 Disorder of kidney and ureter, unspecified: Secondary | ICD-10-CM | POA: Diagnosis not present

## 2018-11-05 DIAGNOSIS — E785 Hyperlipidemia, unspecified: Secondary | ICD-10-CM | POA: Diagnosis present

## 2018-11-05 DIAGNOSIS — J09X2 Influenza due to identified novel influenza A virus with other respiratory manifestations: Secondary | ICD-10-CM | POA: Diagnosis present

## 2018-11-05 DIAGNOSIS — Z66 Do not resuscitate: Secondary | ICD-10-CM | POA: Diagnosis present

## 2018-11-05 DIAGNOSIS — K254 Chronic or unspecified gastric ulcer with hemorrhage: Secondary | ICD-10-CM | POA: Diagnosis present

## 2018-11-05 DIAGNOSIS — I10 Essential (primary) hypertension: Secondary | ICD-10-CM | POA: Diagnosis present

## 2018-11-05 DIAGNOSIS — Z6841 Body Mass Index (BMI) 40.0 and over, adult: Secondary | ICD-10-CM | POA: Diagnosis not present

## 2018-11-05 DIAGNOSIS — I4891 Unspecified atrial fibrillation: Secondary | ICD-10-CM | POA: Diagnosis not present

## 2018-11-05 DIAGNOSIS — E876 Hypokalemia: Secondary | ICD-10-CM | POA: Diagnosis not present

## 2018-11-05 DIAGNOSIS — K250 Acute gastric ulcer with hemorrhage: Secondary | ICD-10-CM | POA: Diagnosis not present

## 2018-11-05 DIAGNOSIS — B9789 Other viral agents as the cause of diseases classified elsewhere: Secondary | ICD-10-CM | POA: Diagnosis present

## 2018-11-05 DIAGNOSIS — I480 Paroxysmal atrial fibrillation: Secondary | ICD-10-CM | POA: Diagnosis present

## 2018-11-05 DIAGNOSIS — Z79899 Other long term (current) drug therapy: Secondary | ICD-10-CM | POA: Diagnosis not present

## 2018-11-05 LAB — CBC WITH DIFFERENTIAL/PLATELET
ABS IMMATURE GRANULOCYTES: 0 10*3/uL (ref 0.00–0.07)
Basophils Absolute: 0 10*3/uL (ref 0.0–0.1)
Basophils Relative: 0 %
Eosinophils Absolute: 0 10*3/uL (ref 0.0–0.5)
Eosinophils Relative: 0 %
HCT: 32.9 % — ABNORMAL LOW (ref 36.0–46.0)
Hemoglobin: 10.5 g/dL — ABNORMAL LOW (ref 12.0–15.0)
Lymphocytes Relative: 2 %
Lymphs Abs: 0.1 10*3/uL — ABNORMAL LOW (ref 0.7–4.0)
MCH: 28.8 pg (ref 26.0–34.0)
MCHC: 31.9 g/dL (ref 30.0–36.0)
MCV: 90.4 fL (ref 80.0–100.0)
Monocytes Absolute: 0.1 10*3/uL (ref 0.1–1.0)
Monocytes Relative: 2 %
Neutro Abs: 5.5 10*3/uL (ref 1.7–7.7)
Neutrophils Relative %: 96 %
PLATELETS: 208 10*3/uL (ref 150–400)
RBC: 3.64 MIL/uL — ABNORMAL LOW (ref 3.87–5.11)
RDW: 13.3 % (ref 11.5–15.5)
WBC: 5.7 10*3/uL (ref 4.0–10.5)
nRBC: 0 % (ref 0.0–0.2)
nRBC: 0 /100 WBC

## 2018-11-05 LAB — PROCALCITONIN: Procalcitonin: 6.13 ng/mL

## 2018-11-05 LAB — BLOOD CULTURE ID PANEL (REFLEXED)
Acinetobacter baumannii: NOT DETECTED
CANDIDA TROPICALIS: NOT DETECTED
Candida albicans: NOT DETECTED
Candida glabrata: NOT DETECTED
Candida krusei: NOT DETECTED
Candida parapsilosis: NOT DETECTED
Enterobacter cloacae complex: NOT DETECTED
Enterobacteriaceae species: NOT DETECTED
Enterococcus species: NOT DETECTED
Escherichia coli: NOT DETECTED
Haemophilus influenzae: NOT DETECTED
KLEBSIELLA PNEUMONIAE: NOT DETECTED
Klebsiella oxytoca: NOT DETECTED
Listeria monocytogenes: NOT DETECTED
Methicillin resistance: NOT DETECTED
Neisseria meningitidis: NOT DETECTED
Proteus species: NOT DETECTED
Pseudomonas aeruginosa: NOT DETECTED
STAPHYLOCOCCUS SPECIES: DETECTED — AB
Serratia marcescens: NOT DETECTED
Staphylococcus aureus (BCID): NOT DETECTED
Streptococcus agalactiae: NOT DETECTED
Streptococcus pneumoniae: NOT DETECTED
Streptococcus pyogenes: NOT DETECTED
Streptococcus species: NOT DETECTED

## 2018-11-05 LAB — FERRITIN: Ferritin: 136 ng/mL (ref 11–307)

## 2018-11-05 LAB — BASIC METABOLIC PANEL
Anion gap: 10 (ref 5–15)
BUN: 14 mg/dL (ref 8–23)
CO2: 20 mmol/L — ABNORMAL LOW (ref 22–32)
Calcium: 8.8 mg/dL — ABNORMAL LOW (ref 8.9–10.3)
Chloride: 109 mmol/L (ref 98–111)
Creatinine, Ser: 1.01 mg/dL — ABNORMAL HIGH (ref 0.44–1.00)
GFR calc Af Amer: >60 mL/min (ref >60)
GFR calc non Af Amer: 55 mL/min — ABNORMAL LOW (ref >60)
GLUCOSE: 150 mg/dL — AB (ref 70–99)
Potassium: 4.4 mmol/L (ref 3.5–5.1)
Sodium: 139 mmol/L (ref 135–145)

## 2018-11-05 LAB — GLUCOSE, CAPILLARY
GLUCOSE-CAPILLARY: 97 mg/dL (ref 70–99)
Glucose-Capillary: 112 mg/dL — ABNORMAL HIGH (ref 70–99)
Glucose-Capillary: 101 mg/dL — ABNORMAL HIGH (ref 70–99)
Glucose-Capillary: 109 mg/dL — ABNORMAL HIGH (ref 70–99)

## 2018-11-05 LAB — FOLATE: Folate: 33.6 ng/mL (ref >5.9)

## 2018-11-05 LAB — VITAMIN B12: Vitamin B-12: 1953 pg/mL — ABNORMAL HIGH (ref 180–914)

## 2018-11-05 LAB — IRON AND TIBC
IRON: 12 ug/dL — AB (ref 28–170)
Saturation Ratios: 4 % — ABNORMAL LOW (ref 10.4–31.8)
TIBC: 311 ug/dL (ref 250–450)
UIBC: 299 ug/dL

## 2018-11-05 LAB — RETICULOCYTES
Immature Retic Fract: 4.7 % (ref 2.3–15.9)
RBC.: 3.63 MIL/uL — ABNORMAL LOW (ref 3.87–5.11)
RETIC COUNT ABSOLUTE: 32.7 10*3/uL (ref 19.0–186.0)
Retic Ct Pct: 0.9 % (ref 0.4–3.1)

## 2018-11-05 MED ORDER — ACETAMINOPHEN 650 MG RE SUPP: 650.0000 mg | Freq: Four times a day (QID) | RECTAL | Status: DC | PRN

## 2018-11-05 MED ORDER — GUAIFENESIN-DM 100-10 MG/5ML PO SYRP
5.0000 mL | ORAL_SOLUTION | ORAL | Status: DC | PRN
  Administered 2018-11-05: 5 mL via ORAL
  Filled 2018-11-05: qty 5

## 2018-11-05 MED ORDER — TRAMADOL HCL 50 MG PO TABS
50.0000 mg | ORAL_TABLET | Freq: Four times a day (QID) | ORAL | Status: DC | PRN
  Administered 2018-11-05 – 2018-11-18 (×24): 50 mg via ORAL
  Filled 2018-11-05 (×25): qty 1

## 2018-11-05 MED ORDER — SODIUM CHLORIDE 0.9% FLUSH
3.0000 mL | Freq: Two times a day (BID) | INTRAVENOUS | Status: DC
  Administered 2018-11-05 – 2018-11-19 (×16): 3 mL via INTRAVENOUS

## 2018-11-05 MED ORDER — HYDRALAZINE HCL 20 MG/ML IJ SOLN
10.0000 mg | INTRAMUSCULAR | Status: DC | PRN
  Administered 2018-11-06 – 2018-11-11 (×5): 10 mg via INTRAVENOUS
  Filled 2018-11-05 (×5): qty 1

## 2018-11-05 MED ORDER — SODIUM CHLORIDE 0.9 % IV SOLN
250.0000 mL | INTRAVENOUS | Status: DC | PRN
  Administered 2018-11-17: 09:00:00 via INTRAVENOUS

## 2018-11-05 MED ORDER — ONDANSETRON HCL 4 MG/2ML IJ SOLN
4.0000 mg | Freq: Four times a day (QID) | INTRAMUSCULAR | Status: DC | PRN
  Administered 2018-11-06: 4 mg via INTRAVENOUS
  Filled 2018-11-05: qty 2

## 2018-11-05 MED ORDER — ONDANSETRON HCL 4 MG PO TABS: 4.0000 mg | ORAL_TABLET | Freq: Four times a day (QID) | ORAL | Status: DC | PRN

## 2018-11-05 MED ORDER — IPRATROPIUM-ALBUTEROL 0.5-2.5 (3) MG/3ML IN SOLN
3.0000 mL | Freq: Four times a day (QID) | RESPIRATORY_TRACT | Status: DC
  Administered 2018-11-05 – 2018-11-06 (×3): 3 mL via RESPIRATORY_TRACT
  Filled 2018-11-05 (×3): qty 3

## 2018-11-05 MED ORDER — IPRATROPIUM-ALBUTEROL 0.5-2.5 (3) MG/3ML IN SOLN: 3.0000 mL | RESPIRATORY_TRACT | Status: DC | PRN

## 2018-11-05 MED ORDER — GUAIFENESIN-DM 100-10 MG/5ML PO SYRP
10.0000 mL | ORAL_SOLUTION | Freq: Four times a day (QID) | ORAL | Status: DC
  Administered 2018-11-05 – 2018-11-15 (×35): 10 mL via ORAL
  Filled 2018-11-05 (×36): qty 10

## 2018-11-05 MED ORDER — BENZONATATE 100 MG PO CAPS
200.0000 mg | ORAL_CAPSULE | Freq: Three times a day (TID) | ORAL | Status: DC | PRN
  Administered 2018-11-05 – 2018-11-15 (×10): 200 mg via ORAL
  Filled 2018-11-05 (×11): qty 2

## 2018-11-05 MED ORDER — POLYETHYLENE GLYCOL 3350 17 G PO PACK
17.0000 g | PACK | Freq: Every day | ORAL | Status: DC | PRN
  Administered 2018-11-08 – 2018-11-13 (×6): 17 g via ORAL
  Filled 2018-11-05 (×6): qty 1

## 2018-11-05 MED ORDER — HYDRALAZINE HCL 20 MG/ML IJ SOLN
10.0000 mg | Freq: Once | INTRAMUSCULAR | Status: AC
  Administered 2018-11-05: 10 mg via INTRAVENOUS
  Filled 2018-11-05: qty 1

## 2018-11-05 MED ORDER — ACETAMINOPHEN 325 MG PO TABS
650.0000 mg | ORAL_TABLET | Freq: Four times a day (QID) | ORAL | Status: DC | PRN
  Administered 2018-11-05 – 2018-11-17 (×10): 650 mg via ORAL
  Filled 2018-11-05 (×9): qty 2

## 2018-11-05 MED ORDER — METOPROLOL TARTRATE 25 MG PO TABS
25.0000 mg | ORAL_TABLET | Freq: Two times a day (BID) | ORAL | Status: DC
  Administered 2018-11-05 (×2): 25 mg via ORAL
  Filled 2018-11-05 (×2): qty 1

## 2018-11-05 MED ORDER — SODIUM CHLORIDE 0.9% FLUSH
3.0000 mL | Freq: Two times a day (BID) | INTRAVENOUS | Status: DC
  Administered 2018-11-05 – 2018-11-19 (×23): 3 mL via INTRAVENOUS

## 2018-11-05 MED ORDER — MORPHINE SULFATE (PF) 4 MG/ML IV SOLN: 3.0000 mg | INTRAVENOUS | Status: DC | PRN

## 2018-11-05 MED ORDER — ALBUTEROL SULFATE (2.5 MG/3ML) 0.083% IN NEBU: 2.5000 mg | INHALATION_SOLUTION | RESPIRATORY_TRACT | Status: DC | PRN

## 2018-11-05 MED ORDER — OSELTAMIVIR PHOSPHATE 30 MG PO CAPS
30.0000 mg | ORAL_CAPSULE | Freq: Two times a day (BID) | ORAL | Status: AC
  Administered 2018-11-05 – 2018-11-09 (×9): 30 mg via ORAL
  Filled 2018-11-05 (×10): qty 1

## 2018-11-05 MED ORDER — SODIUM CHLORIDE 0.9% FLUSH
3.0000 mL | INTRAVENOUS | Status: DC | PRN
  Administered 2018-11-16: 3 mL via INTRAVENOUS
  Filled 2018-11-05: qty 3

## 2018-11-05 NOTE — H&P (Signed)
History and Physical    Adrienne Stone CWC:[id number] DOB: May 21, 1946 DOA: 11/04/2018  PCP: [name], PA-C   Patient coming from: Home   Chief Complaint: flu-like illness with aches, chills, cough, headache, fatigue, nausea, vomiting  HPI: Adrienne Stone is a 73 y.o. female with medical history significant for obesity (BMI 44), hypertension, and depression, now presenting to the emergency department for evaluation of flulike illness.  The patient reports that she been in her usual state of health until the evening of 11/03/2018 when she developed acute onset of chills with fatigue, aches including headache, cough, nausea with a few episodes of nonbloody vomiting, and shortness of breath.  She received the influenza vaccine this year and does not know of any sick contacts.  She denies chest pain.  She denied any lower extremity tenderness.  Her chronic upper back and neck pain has worsened over the past day or so which the patient attributes to her frequent coughing.  She denies abdominal pain.  [facility] ED Course: Upon arrival to the ED, patient is found to be febrile to 38.1 C, saturating mid 90s on room air, tachypneic in the 30s, tachycardic in the 120s, and with blood pressure 237/100.  EKG features a sinus tachycardia with rate 124 and PACs.  Chest x-ray is negative for acute cardiopulmonary disease.  Chemistry panel is notable for a bicarbonate of 19 and creatinine of 1.03, up from 0.79 in November.  CBC features a normocytic anemia with hemoglobin 10.6, down from 12.7 in November.  Influenza a is positive.  Lactic acid was normal and troponin undetectable.  Patient underwent CTA chest that was negative for PE, but notable for patchy bilateral groundglass opacities in the upper lobes that likely reflects an infectious or inflammatory pneumonitis.  Patient was given 500 cc normal saline, blood cultures were collected, and she was treated with acetaminophen, albuterol,  Rocephin, azithromycin, hydralazine, prednisone, DuoNeb, and Lopressor.  She was placed on BiPAP.  Tachycardia resolved, blood pressure improved, work of breathing has improved, and she has been transferred to [facility] for further evaluation and management of influenza A with respiratory distress.  Review of Systems:  All other systems reviewed and apart from HPI, are negative.  Past Medical History:  Diagnosis Date  . Depression   . Hypercholesteremia   . Hypertension     Past Surgical History:  Procedure Laterality Date  . ABDOMINAL HYSTERECTOMY    . CHOLECYSTECTOMY    . GASTRIC BYPASS    . thumb surgery       reports that she has quit smoking. She has never used smokeless tobacco. She reports current alcohol use. She reports that she does not use drugs.  Allergies  Allergen Reactions  . Demerol [Meperidine] Rash  . Penicillins Rash  . Prozac [Fluoxetine Hcl] Palpitations  . Serzone [Nefazodone] Rash  . Sulfa Antibiotics Rash    History reviewed. No pertinent family history.   Prior to Admission medications   Medication Sig Start Date End Date Taking? Authorizing Provider  Ascorbic Acid (VITAMIN C) 1000 MG tablet Take 1,000 mg by mouth daily.    [provider]  Calcium Carbonate-Vit D-Min (CALCIUM 1200 PO) Take by mouth.    [provider]  escitalopram (LEXAPRO) 10 MG tablet Take 10 mg by mouth daily.    [provider]  ferrous sulfate 325 (65 FE) MG tablet Take 325 mg by mouth daily with breakfast.    [provider]  hydrochlorothiazide (MICROZIDE) 12.5  MG capsule Take 12.5 mg by mouth daily.    [provider]  lisinopril (PRINIVIL,ZESTRIL) 40 MG tablet Take 40 mg by mouth daily.    [provider]  loratadine (CLARITIN) 10 MG tablet Take 10 mg by mouth daily.    [provider]  Magnesium 250 MG TABS Take by mouth.    [provider]  metoprolol tartrate (LOPRESSOR) 25 MG tablet Take 25 mg by mouth  2 (two) times daily.    [provider]  mirtazapine (REMERON) 45 MG tablet Take 45 mg by mouth at bedtime.    [provider]  Multiple Vitamin (MULTIVITAMIN) capsule Take 1 capsule by mouth daily.    [provider]  pantoprazole (PROTONIX) 40 MG tablet Take 40 mg by mouth daily.    [provider]  temazepam (RESTORIL) 30 MG capsule Take 30 mg by mouth at bedtime as needed for sleep.    [provider]  venlafaxine (EFFEXOR) 50 MG tablet Take 50 mg by mouth 2 (two) times daily.    [provider]    Physical Exam: Vitals:   11/05/18 0013 11/05/18 0033 11/05/18 0139 11/05/18 0206  BP: (!) 186/98 (!) 182/81  (!) 191/91  Pulse: 96 99 (!) 101 95  Resp: (!) 30 (!) 29 (!) 32 (!) 24  Temp: 98.4 F (36.9 C)   (!) 100.6 F (38.1 C)  TempSrc: Axillary   Axillary  SpO2: 97% 99% 99% 100%  Weight:    112.9 kg  Height:    5\' 3"  (1.6 m)    Constitutional: Not in acute distress, calm  Eyes: PERTLA, lids and conjunctivae normal ENMT: Mucous membranes are moist. Posterior pharynx clear of any exudate or lesions.   Neck: normal, supple, no masses, no thyromegaly Respiratory: no wheezing, no crackles. Mild tachypnea. Speaking full sentences. No accessory muscle use.  Cardiovascular: S1 & S2 heard, regular rate and rhythm. No significant JVD. Abdomen: No distension, no tenderness, soft. Bowel sounds active.  Musculoskeletal: no clubbing / cyanosis. No joint deformity upper and lower extremities.   Skin: no significant rashes, lesions, ulcers. Warm, dry, well-perfused. Neurologic: no facial asymmetry. Sensation intact. Moving all extremities.  Psychiatric: Alert and oriented to person, place, and situation. Calm, cooperative.    Labs on Admission: I have personally reviewed following labs and imaging studies  CBC: Recent Labs  Lab 11/04/18 1701 11/04/18 1728 11/04/18 2148  WBC 5.2  --   --   NEUTROABS 3.7  --   --   HGB 10.6* 11.2*  10.2*  HCT 34.2* 33.0* 30.0*  MCV 94.2  --   --   PLT 200  --   --    Basic Metabolic Panel: Recent Labs  Lab 11/04/18 1701 11/04/18 1728 11/04/18 2148  NA 137 141 139  K 3.6 3.9 4.2  CL 107  --   --   CO2 19*  --   --   GLUCOSE 115*  --   --   BUN 22  --   --   CREATININE 1.03*  --   --   CALCIUM 8.8*  --   --    GFR: Estimated Creatinine Clearance: 57.9 mL/min (A) (by C-G formula based on SCr of 1.03 mg/dL (H)). Liver Function Tests: Recent Labs  Lab 11/04/18 1701  AST 30  ALT 19  ALKPHOS 60  BILITOT 0.5  PROT 7.2  ALBUMIN 4.0   No results for input(s): LIPASE, AMYLASE in the last 168 hours. No  results for input(s): AMMONIA in the last 168 hours. Coagulation Profile: No results for input(s): INR, PROTIME in the last 168 hours. Cardiac Enzymes: Recent Labs  Lab 11/04/18 1701  TROPONINI <0.03   BNP (last 3 results) No results for input(s): PROBNP in the last 8760 hours. HbA1C: No results for input(s): HGBA1C in the last 72 hours. CBG: No results for input(s): GLUCAP in the last 168 hours. Lipid Profile: No results for input(s): CHOL, HDL, LDLCALC, TRIG, CHOLHDL, LDLDIRECT in the last 72 hours. Thyroid Function Tests: No results for input(s): TSH, T4TOTAL, FREET4, T3FREE, THYROIDAB in the last 72 hours. Anemia Panel: No results for input(s): VITAMINB12, FOLATE, FERRITIN, TIBC, IRON, RETICCTPCT in the last 72 hours. Urine analysis:    Component Value Date/Time   COLORURINE YELLOW 11/04/2018 1701   APPEARANCEUR CLEAR 11/04/2018 1701   LABSPEC 1.015 11/04/2018 1701   PHURINE 5.0 11/04/2018 1701   GLUCOSEU NEGATIVE 11/04/2018 1701   HGBUR NEGATIVE 11/04/2018 1701   BILIRUBINUR NEGATIVE 11/04/2018 1701   KETONESUR NEGATIVE 11/04/2018 1701   PROTEINUR NEGATIVE 11/04/2018 1701   NITRITE NEGATIVE 11/04/2018 1701   LEUKOCYTESUR NEGATIVE 11/04/2018 1701   Sepsis Labs: @LABRCNTIP (procalcitonin:4,lacticidven:4) )No results found for this or any previous  visit (from the past 240 hour(s)).   Radiological Exams on Admission: Dg Chest 2 View  Result Date: 11/04/2018 CLINICAL DATA:  Site cough and flu-like symptoms for 24 hours. EXAM: CHEST - 2 VIEW COMPARISON:  None. FINDINGS: The heart size and mediastinal contours are within normal limits. Coarse prominence of interstitial lung markings is consistent with chronic interstitial disease. No evidence of acute infiltrate or pleural effusion. Eventration of the anterior right hemidiaphragm is noted. Thoracic spine degenerative changes are noted as well as wedge compression fracture deformity of the L1 vertebral body. IMPRESSION: No active cardiopulmonary disease. Electronically Signed   By: Myles RosenthalJohn  Stahl M.D.   On: 11/04/2018 15:41   Ct Angio Chest Pe W/cm &/or Wo Cm  Result Date: 11/04/2018 CLINICAL DATA:  Shortness of breath EXAM: CT ANGIOGRAPHY CHEST WITH CONTRAST TECHNIQUE: Multidetector CT imaging of the chest was performed using the standard protocol during bolus administration of intravenous contrast. Multiplanar CT image reconstructions and MIPs were obtained to evaluate the vascular anatomy. CONTRAST:  100mL ISOVUE-370 IOPAMIDOL (ISOVUE-370) INJECTION 76% COMPARISON:  None. FINDINGS: Cardiovascular: Heart is borderline in size. Aorta is normal caliber. No filling defects in the pulmonary arteries to suggest pulmonary emboli. Scattered coronary artery and aortic calcifications. Mediastinum/Nodes: Borderline sized mediastinal lymph nodes. No hilar or axillary adenopathy. Lungs/Pleura: Scattered ground-glass airspace opacities within the lungs. Favor inflammatory or infectious pneumonitis. No effusions. Upper Abdomen: Imaging into the upper abdomen shows no acute findings. Musculoskeletal: There are collateral venous channels throughout the left chest wall suggesting central venous stenosis, not visible by CT. Chest wall soft tissues otherwise unremarkable. No acute bony abnormality. Review of the MIP images  confirms the above findings. IMPRESSION: No evidence of pulmonary embolus. Patchy bilateral ground-glass opacities in the lungs, most pronounced in the upper lobes. Favor infectious or inflammatory pneumonitis. Borderline heart size. Borderline scattered mediastinal lymph nodes, likely reactive. Coronary artery disease. Aortic Atherosclerosis (ICD10-I70.0). Electronically Signed   By: Charlett NoseKevin  Dover M.D.   On: 11/04/2018 21:37    EKG: Independently reviewed. Sinus tachycardia (rate 124), PAC's.   Assessment/Plan   1. Influenza A  - Presents with 24-hours of aches, fatigue, cough, SOB, N/V, and chills  - She was in respiratory distress on arrival and found to be febrile, tachycardic, and  tachypneic with clear CXR and patchy ground-glass opacities in upper lobes on CTA chest  - Influenza A is positive  - Blood cultures were collected, she was treated with IVF, nebs, prednisone, and empiric abx, and was placed on BiPAP  - Start Tamiflu, continue supplemental O2 as-needed, continue as-needed albuterol nebs    2. Hypertension with hypertensive urgency  - SBP as high as 237 in ED  - She was treated with IV hydralazine and IV Lopressor in ED  - Continue metoprolol, hold lisinopril and HCTZ in light of increased creatinine   3. Depression  - Stable  - Pharmacy medication reconcilliation pending  4. Normocytic anemia  - Hgb is 10.6 on admission, down from 12.7 in November 2019  - No bleeding, she has hx of IDA previously on iron, will check anemia panel     DVT prophylaxis: SCD's  Code Status: DNR  Family Communication: Discussed with patient  Consults called: None Admission status: Observation     Briscoe Deutscherimothy S Kyrsten Deleeuw, MD [facility] Pager [phone]  If 7PM-7AM, please contact night-coverage [email or web address] Password [email or web address]  11/05/2018, 3:17 AM

## 2018-11-05 NOTE — Progress Notes (Signed)
PROGRESS NOTE    Adrienne Stone  BUY:[id number] DOB: 05-27-1946 DOA: 11/04/2018 PCP: [name], PA-C    Brief Narrative:  73 year old female who presented with chills, cough, fatigue, nausea vomiting.  She does have significant past medical history for hypertension, depression and obesity.  Reported about 48 hours of fevers, fatigue, headaches, cough, nausea and vomiting.  She was placed on non-invasive mechanical ventilation and on her initial physical examination she was febrile 38.1 C, oxygen saturation was 90% on room air, respiratory rate was 30 breaths/min and heart rate 120 bpm, with a blood pressure of 237/100.  Moist mucous membranes, lungs with no wheezing, rales or rhonchi, no accessory muscle use, heart S1-S2 present rhythmic, abdomen soft nontender, no extremity edema.  Her chest x-ray had a small right pleural effusion with signs of hiatal hernia.  Chest CT negative for pleural embolism, positive bilateral groundglass opacities.  Her initial EKG shows sinus tachycardia, follow-up EKG atrial fibrillation.  Patient was admitted to the hospital with working diagnosis of acute hypoxic respiratory failure due to influenza pneumonia, complicated by sepsis, and atrial fibrillation.    Assessment & Plan:   Principal Problem:   Influenza A Active Problems:   Acute respiratory failure with hypoxia (HCC)   Acute respiratory distress   Mild renal insufficiency   Hypertensive urgency   Normocytic anemia   1. Acute hypoxic respiratory failure due to Influenza A pneumonia. Present on admission. Will continue bronchodilator therapy, oxymetry monitoring and supplemental 02 per Sigurd to keep 02 saturation above 92%. Continue oseltamivir. Added scheduled cough suppressive therapy. Out of bed as tolerated and physical therapy evaluation. Patient has been of bipap today.   2. Uncontrolled HTN, urgency. Will continue blood pressure control with metoprolol and as needed hydralazine, blood  pressure systolic 157 to 160 mmHg.   3. Depression. Continue   4. Chronic multifactorial anemia. Stable hb and hct, no indication for prbc transfusion.      DVT prophylaxis: enoxaparin   Code Status:  full Family Communication: I spoke with patient's sister at the bedside and all questions were addressed.  Disposition Plan/ discharge barriers:  Pending clinical improvement.   Body mass index is 44.11 kg/m. Malnutrition Type:      Malnutrition Characteristics:      Nutrition Interventions:     RN Pressure Injury Documentation:     Consultants:     Procedures:     Antimicrobials:   Oseltamivir    Subjective: Patient continue to have dyspnea, cough and wheezing, no nausea or vomiting, no chest pain. Has not used bipap during the day today. At home not on cpap. Remote history of tobacco abuse.   Objective: Vitals:   11/05/18 0456 11/05/18 0734 11/05/18 0835 11/05/18 0927  BP: (!) 200/84  (!) 188/85 (!) 160/77  Pulse: 95 89 (!) 102 86  Resp: (!) 29 (!) 23  (!) 26  Temp: 98.7 F (37.1 C)   98.7 F (37.1 C)  TempSrc: Axillary   Oral  SpO2: 100% 98% 97% 96%  Weight:      Height:        Intake/Output Summary (Last 24 hours) at 11/05/2018 1155 Last data filed at 11/04/2018 1950 Gross per 24 hour  Intake 599.26 ml  Output -  Net 599.26 ml   Filed Weights   11/05/18 0206  Weight: 112.9 kg    Examination:   General: deconditioned and ill looking appearing  Neurology: Awake and alert, non focal  E ENT: positive pallor, no icterus,  oral mucosa moist Cardiovascular: No JVD. S1-S2 present, rhythmic, no gallops, rubs, or murmurs. No lower extremity edema. Pulmonary: decreased breath sounds bilaterally, poor air movement, positive expiratory wheezing,with scattered  rhonchi and rales. Gastrointestinal. Abdomen protuberant with no organomegaly, non tender, no rebound or guarding Skin. No rashes Musculoskeletal: no joint deformities     Data  Reviewed: I have personally reviewed following labs and imaging studies  CBC: Recent Labs  Lab 11/04/18 1701 11/04/18 1728 11/04/18 2148 11/05/18 0337  WBC 5.2  --   --  5.7  NEUTROABS 3.7  --   --  5.5  HGB 10.6* 11.2* 10.2* 10.5*  HCT 34.2* 33.0* 30.0* 32.9*  MCV 94.2  --   --  90.4  PLT 200  --   --  208   Basic Metabolic Panel: Recent Labs  Lab 11/04/18 1701 11/04/18 1728 11/04/18 2148 11/05/18 0337  NA 137 141 139 139  K 3.6 3.9 4.2 4.4  CL 107  --   --  109  CO2 19*  --   --  20*  GLUCOSE 115*  --   --  150*  BUN 22  --   --  14  CREATININE 1.03*  --   --  1.01*  CALCIUM 8.8*  --   --  8.8*   GFR: Estimated Creatinine Clearance: 59.1 mL/min (A) (by C-G formula based on SCr of 1.01 mg/dL (H)). Liver Function Tests: Recent Labs  Lab 11/04/18 1701  AST 30  ALT 19  ALKPHOS 60  BILITOT 0.5  PROT 7.2  ALBUMIN 4.0   No results for input(s): LIPASE, AMYLASE in the last 168 hours. No results for input(s): AMMONIA in the last 168 hours. Coagulation Profile: No results for input(s): INR, PROTIME in the last 168 hours. Cardiac Enzymes: Recent Labs  Lab 11/04/18 1701  TROPONINI <0.03   BNP (last 3 results) No results for input(s): PROBNP in the last 8760 hours. HbA1C: No results for input(s): HGBA1C in the last 72 hours. CBG: Recent Labs  Lab 11/05/18 0808 11/05/18 1136  GLUCAP 112* 101*   Lipid Profile: No results for input(s): CHOL, HDL, LDLCALC, TRIG, CHOLHDL, LDLDIRECT in the last 72 hours. Thyroid Function Tests: No results for input(s): TSH, T4TOTAL, FREET4, T3FREE, THYROIDAB in the last 72 hours. Anemia Panel: Recent Labs    11/05/18 0400  VITAMINB12 1,953*  FOLATE 33.6  FERRITIN 136  TIBC 311  IRON 12*  RETICCTPCT 0.9      Radiology Studies: I have reviewed all of the imaging during this hospital visit personally     Scheduled Meds: . metoprolol tartrate  25 mg Oral BID  . oseltamivir  30 mg Oral BID  . sodium chloride  flush  3 mL Intravenous Q12H  . sodium chloride flush  3 mL Intravenous Q12H   Continuous Infusions: . sodium chloride       LOS: 0 days        Mosie Angus Annett Gulaaniel Juna Caban, MD [facility] Pager [phone]

## 2018-11-05 NOTE — Plan of Care (Signed)
  Problem: Education: Goal: Knowledge of General Education information will improve Description Including pain rating scale, medication(s)/side effects and non-pharmacologic comfort measures Outcome: Progressing   

## 2018-11-05 NOTE — Progress Notes (Signed)
RT note: Removed patient from BIPAP and placed on 2L nasal canula. Patients vital signs stable at this time.

## 2018-11-05 NOTE — Plan of Care (Signed)
  Problem: Clinical Measurements: Goal: Ability to maintain clinical measurements within normal limits will improve Outcome: Progressing   Problem: Clinical Measurements: Goal: Respiratory complications will improve Outcome: Progressing   

## 2018-11-05 NOTE — Progress Notes (Signed)
PHARMACY - PHYSICIAN COMMUNICATION CRITICAL VALUE ALERT - BLOOD CULTURE IDENTIFICATION (BCID)  KEALOHILANI HERMANNS is an 73 y.o. female who presented to [facility] on 11/04/2018 with a chief complaint of flu-like illness with aches, chills, cough, headache, fatigue, nausea, vomiting.  Assessment: Patient +influenza A on admit. Anaerobic bottle of 1 set of BCx drawn at [facility] resulted as CONS (MecA negative).  Name of physician (or Provider) Contacted: Dr. Ella Jubilee  Current antibiotics: Currently on Tamiflu. BCx likely contaminant - no changes needed.  Changes to prescribed antibiotics recommended:  none  Results for orders placed or performed during the hospital encounter of 11/04/18  Blood Culture ID Panel (Reflexed) (Collected: 11/04/2018  4:58 PM)  Result Value Ref Range   Enterococcus species NOT DETECTED NOT DETECTED   Listeria monocytogenes NOT DETECTED NOT DETECTED   Staphylococcus species DETECTED (A) NOT DETECTED   Staphylococcus aureus (BCID) NOT DETECTED NOT DETECTED   Methicillin resistance NOT DETECTED NOT DETECTED   Streptococcus species NOT DETECTED NOT DETECTED   Streptococcus agalactiae NOT DETECTED NOT DETECTED   Streptococcus pneumoniae NOT DETECTED NOT DETECTED   Streptococcus pyogenes NOT DETECTED NOT DETECTED   Acinetobacter baumannii NOT DETECTED NOT DETECTED   Enterobacteriaceae species NOT DETECTED NOT DETECTED   Enterobacter cloacae complex NOT DETECTED NOT DETECTED   Escherichia coli NOT DETECTED NOT DETECTED   Klebsiella oxytoca NOT DETECTED NOT DETECTED   Klebsiella pneumoniae NOT DETECTED NOT DETECTED   Proteus species NOT DETECTED NOT DETECTED   Serratia marcescens NOT DETECTED NOT DETECTED   Haemophilus influenzae NOT DETECTED NOT DETECTED   Neisseria meningitidis NOT DETECTED NOT DETECTED   Pseudomonas aeruginosa NOT DETECTED NOT DETECTED   Candida albicans NOT DETECTED NOT DETECTED   Candida glabrata NOT DETECTED NOT DETECTED   Candida krusei NOT  DETECTED NOT DETECTED   Candida parapsilosis NOT DETECTED NOT DETECTED   Candida tropicalis NOT DETECTED NOT DETECTED   Babs Bertin, PharmD, BCPS Please check AMION for all [facility] contact numbers Clinical Pharmacist 11/05/2018 4:58 PM

## 2018-11-06 DIAGNOSIS — J9601 Acute respiratory failure with hypoxia: Secondary | ICD-10-CM

## 2018-11-06 LAB — CBC WITH DIFFERENTIAL/PLATELET
Abs Immature Granulocytes: 0.03 10*3/uL (ref 0.00–0.07)
Basophils Absolute: 0 10*3/uL (ref 0.0–0.1)
Basophils Relative: 0 %
EOS PCT: 0 %
Eosinophils Absolute: 0 10*3/uL (ref 0.0–0.5)
HCT: 38.1 % (ref 36.0–46.0)
Hemoglobin: 12.1 g/dL (ref 12.0–15.0)
Immature Granulocytes: 0 %
Lymphocytes Relative: 10 %
Lymphs Abs: 0.8 10*3/uL (ref 0.7–4.0)
MCH: 28.9 pg (ref 26.0–34.0)
MCHC: 31.8 g/dL (ref 30.0–36.0)
MCV: 91.1 fL (ref 80.0–100.0)
Monocytes Absolute: 1 10*3/uL (ref 0.1–1.0)
Monocytes Relative: 12 %
Neutro Abs: 6.3 10*3/uL (ref 1.7–7.7)
Neutrophils Relative %: 78 %
Platelets: 213 10*3/uL (ref 150–400)
RBC: 4.18 MIL/uL (ref 3.87–5.11)
RDW: 13.7 % (ref 11.5–15.5)
WBC: 8 10*3/uL (ref 4.0–10.5)
nRBC: 0 % (ref 0.0–0.2)

## 2018-11-06 LAB — BASIC METABOLIC PANEL
Anion gap: 12 (ref 5–15)
BUN: 13 mg/dL (ref 8–23)
CO2: 21 mmol/L — ABNORMAL LOW (ref 22–32)
Calcium: 9.1 mg/dL (ref 8.9–10.3)
Chloride: 106 mmol/L (ref 98–111)
Creatinine, Ser: 0.98 mg/dL (ref 0.44–1.00)
GFR calc Af Amer: >60 mL/min (ref >60)
GFR calc non Af Amer: 57 mL/min — ABNORMAL LOW (ref >60)
Glucose, Bld: 108 mg/dL — ABNORMAL HIGH (ref 70–99)
Potassium: 4.4 mmol/L (ref 3.5–5.1)
Sodium: 139 mmol/L (ref 135–145)

## 2018-11-06 LAB — GLUCOSE, CAPILLARY
Glucose-Capillary: 123 mg/dL — ABNORMAL HIGH (ref 70–99)
Glucose-Capillary: 123 mg/dL — ABNORMAL HIGH (ref 70–99)

## 2018-11-06 LAB — PROCALCITONIN: Procalcitonin: 3.84 ng/mL

## 2018-11-06 MED ORDER — BISACODYL 5 MG PO TBEC
5.0000 mg | DELAYED_RELEASE_TABLET | Freq: Every day | ORAL | Status: DC | PRN
  Administered 2018-11-11 – 2018-11-13 (×3): 5 mg via ORAL
  Filled 2018-11-06 (×3): qty 1

## 2018-11-06 MED ORDER — VENLAFAXINE HCL 50 MG PO TABS
50.0000 mg | ORAL_TABLET | Freq: Two times a day (BID) | ORAL | Status: DC
  Administered 2018-11-06 – 2018-11-19 (×27): 50 mg via ORAL
  Filled 2018-11-06 (×28): qty 1

## 2018-11-06 MED ORDER — DILTIAZEM HCL 25 MG/5ML IV SOLN
10.0000 mg | Freq: Once | INTRAVENOUS | Status: AC
  Administered 2018-11-06: 10 mg via INTRAVENOUS
  Filled 2018-11-06: qty 5

## 2018-11-06 MED ORDER — MIRTAZAPINE 7.5 MG PO TABS
45.0000 mg | ORAL_TABLET | Freq: Every day | ORAL | Status: DC
  Administered 2018-11-06 – 2018-11-18 (×13): 45 mg via ORAL
  Filled 2018-11-06 (×14): qty 6

## 2018-11-06 MED ORDER — METOPROLOL TARTRATE 25 MG PO TABS: 25.0000 mg | ORAL_TABLET | Freq: Three times a day (TID) | ORAL | Status: DC

## 2018-11-06 MED ORDER — ONDANSETRON HCL 4 MG/2ML IJ SOLN
4.0000 mg | INTRAMUSCULAR | Status: DC | PRN
  Administered 2018-11-06 – 2018-11-14 (×8): 4 mg via INTRAVENOUS
  Filled 2018-11-06 (×8): qty 2

## 2018-11-06 MED ORDER — HYDROCHLOROTHIAZIDE 25 MG PO TABS
25.0000 mg | ORAL_TABLET | Freq: Every day | ORAL | Status: DC
  Administered 2018-11-06 – 2018-11-10 (×5): 25 mg via ORAL
  Filled 2018-11-06 (×5): qty 1

## 2018-11-06 MED ORDER — DILTIAZEM HCL-DEXTROSE 100-5 MG/100ML-% IV SOLN (PREMIX)
5.0000 mg/h | INTRAVENOUS | Status: DC
  Administered 2018-11-06: 12.5 mg/h via INTRAVENOUS
  Administered 2018-11-06: 5 mg/h via INTRAVENOUS
  Administered 2018-11-07: 12.5 mg/h via INTRAVENOUS
  Filled 2018-11-06 (×4): qty 100

## 2018-11-06 MED ORDER — TEMAZEPAM 7.5 MG PO CAPS
30.0000 mg | ORAL_CAPSULE | Freq: Once | ORAL | Status: AC
  Administered 2018-11-06: 30 mg via ORAL
  Filled 2018-11-06: qty 4

## 2018-11-06 MED ORDER — IPRATROPIUM-ALBUTEROL 0.5-2.5 (3) MG/3ML IN SOLN
3.0000 mL | RESPIRATORY_TRACT | Status: DC | PRN
  Administered 2018-11-06 – 2018-11-07 (×2): 3 mL via RESPIRATORY_TRACT
  Filled 2018-11-06 (×2): qty 3

## 2018-11-06 MED ORDER — METOPROLOL TARTRATE 5 MG/5ML IV SOLN
5.0000 mg | INTRAVENOUS | Status: AC | PRN
  Administered 2018-11-06 (×2): 5 mg via INTRAVENOUS
  Filled 2018-11-06 (×2): qty 5

## 2018-11-06 MED ORDER — APIXABAN 5 MG PO TABS
5.0000 mg | ORAL_TABLET | Freq: Two times a day (BID) | ORAL | Status: DC
  Administered 2018-11-06: 5 mg via ORAL
  Filled 2018-11-06: qty 1

## 2018-11-06 MED ORDER — METOPROLOL TARTRATE 50 MG PO TABS
50.0000 mg | ORAL_TABLET | Freq: Two times a day (BID) | ORAL | Status: DC
  Administered 2018-11-06 – 2018-11-10 (×9): 50 mg via ORAL
  Filled 2018-11-06 (×9): qty 1

## 2018-11-06 MED ORDER — ESCITALOPRAM OXALATE 10 MG PO TABS: 10.0000 mg | ORAL_TABLET | Freq: Every day | ORAL | Status: DC

## 2018-11-06 NOTE — Progress Notes (Signed)
Patient is currently on [facility] with sats of 96% and is resting comfortably. All vitals are stable. BIPAP is not needed at this time. Will continue to monitor.

## 2018-11-06 NOTE — Progress Notes (Signed)
Patient HR rate sustaining in the140's-150's. On call Linton Flemings, NP text paged. Metoprolol 5 mg ordered and given x 2 at 0058 and 0144. HR remained high, still sustaining in the 140's -150's.  On call Linton Flemings, NP notified again. Cardizem 10 mg IV ordered x 1, given at 0429. HR dropped briefly to 115-125 then went up to 160's-170's sustained. Linton Flemings, NP text paged. No response yet. HR still sustaining in the 160's. Will continue to monitor.

## 2018-11-06 NOTE — Care Management (Signed)
#    4.   S /W     SHAQOYA   @  [facility] # [phone]  ELIQUIS  5 MG BID COVER- YES CO-PAY- $ 47.00 TIER- NO PRIOR APPROVAL- NO  PREFERRED PHARMACY: YES   -  [facility]

## 2018-11-06 NOTE — Evaluation (Signed)
Physical Therapy Evaluation Patient Details [name] MRN: [id number] DOB: Nov 03, 1945 Today's Date: 11/06/2018   History of Present Illness  Patient is a 73 y/o female who presents with cough, sore throat, SOB. Admitted with acute respiratory failure due to + flu A, PNA complicated by sepsis and A-fib. CTA chest- patchy bil opacities suspicious for infectious or inflammatory PNA. PMH includes HTN, depression, hypercholesteremia.  Clinical Impression  Patient presents with generalized weakness, impaired balance, decreased cardiovascular endurance and impaired mobility s/p above. Tolerated short distance ambulation with Min A for balance/safety. HR up to 156 bpm during activity and dizziness reported. Pt incontinent of urine upon standing. Pt lives alone and Mod I PTA, using SPC for community ambulation and furniture walker for household ambulation. 2/4 DOE. Would likely benefit from RW next session to help with endurance/balance. Will follow acutely to maximize independence and mobility prior to return home.    Follow Up Recommendations Home health PT;Supervision for mobility/OOB    Equipment Recommendations  3in1 (PT)    Recommendations for Other Services       Precautions / Restrictions Precautions Precautions: Fall Precaution Comments: watch HR Restrictions Weight Bearing Restrictions: No      Mobility  Bed Mobility Overal bed mobility: Needs Assistance Bed Mobility: Rolling;Sidelying to Sit Rolling: Min guard Sidelying to sit: Min guard;HOB elevated       General bed mobility comments: Use of rail but no assist needed.   Transfers Overall transfer level: Needs assistance Equipment used: None Transfers: Sit to/from Stand Sit to Stand: Min assist         General transfer comment: Min A to steady in standing; reaching for counter/IV pole for support. Transferred to chair post ambulation.  Ambulation/Gait   Gait Distance (Feet): 20 Feet Assistive  device: IV Pole(furniture) Gait Pattern/deviations: Step-through pattern;Wide base of support;Decreased stride length Gait velocity: decreased   General Gait Details: Slow, mildly unsteady gait with pt reaching for furniture for support and ultimately IV pole (furniture walker at home) HR up to 150s bpm A-fib.  Stairs            Wheelchair Mobility    Modified Rankin (Stroke Patients Only)       Balance Overall balance assessment: Needs assistance Sitting-balance support: Feet supported;No upper extremity supported Sitting balance-Leahy Scale: Fair     Standing balance support: During functional activity Standing balance-Leahy Scale: Fair Standing balance comment: Able to stand statically without UE support, requires UE support for dynamic tasks                             Pertinent Vitals/Pain Pain Assessment: Faces Faces Pain Scale: Hurts little more Pain Location: back, arthritis Pain Descriptors / Indicators: Aching;Discomfort Pain Intervention(s): Monitored during session    Home Living Family/patient expects to be discharged to:: Private residence Living Arrangements: Alone Available Help at Discharge: Family Type of Home: House Home Access: Level entry     Home Layout: One level Home Equipment: Tub bench;Cane - single point;Walker - 2 wheels;Grab bars - toilet;Grab bars - tub/shower      Prior Function Level of Independence: Independent with assistive device(s)   Gait / Transfers Assistance Needed: Uses SPC for ambulation in community. Furniture walker for household ambulation.  ADL's / Homemaking Assistance Needed: Does her own ADLs. Drives, goes to grocery store  Comments: Sleeps in recliner     Hand Dominance        Extremity/Trunk Assessment  Upper Extremity Assessment Upper Extremity Assessment: Defer to OT evaluation    Lower Extremity Assessment Lower Extremity Assessment: Generalized weakness(but functional)        Communication   Communication: No difficulties  Cognition Arousal/Alertness: Awake/alert Behavior During Therapy: WFL for tasks assessed/performed Overall Cognitive Status: Within Functional Limits for tasks assessed                                        General Comments General comments (skin integrity, edema, etc.): Sister present during session.    Exercises     Assessment/Plan    PT Assessment Patient needs continued PT services  PT Problem List Decreased strength;Decreased balance;Cardiopulmonary status limiting activity;Decreased activity tolerance;Obesity;Pain       PT Treatment Interventions Functional mobility training;Balance training;Patient/family education;Gait training;Therapeutic exercise;Therapeutic activities    PT Goals (Current goals can be found in the Care Plan section)  Acute Rehab PT Goals Patient Stated Goal: to get better and go home PT Goal Formulation: With patient Time For Goal Achievement: 11/20/18 Potential to Achieve Goals: Good    Frequency Min 3X/week   Barriers to discharge Decreased caregiver support lives alone    Co-evaluation               AM-PAC PT "6 Clicks" Mobility  Outcome Measure Help needed turning from your back to your side while in a flat bed without using bedrails?: A Little Help needed moving from lying on your back to sitting on the side of a flat bed without using bedrails?: A Little Help needed moving to and from a bed to a chair (including a wheelchair)?: A Little Help needed standing up from a chair using your arms (e.g., wheelchair or bedside chair)?: A Little Help needed to walk in hospital room?: A Little Help needed climbing 3-5 steps with a railing? : A Little 6 Click Score: 18    End of Session Equipment Utilized During Treatment: Gait belt;Oxygen Activity Tolerance: Treatment limited secondary to medical complications (Comment)(tachycardia) Patient left: in chair;with call  bell/phone within reach;with family/visitor present Nurse Communication: Mobility status PT Visit Diagnosis: Difficulty in walking, not elsewhere classified (R26.2)    Time: [id number] PT Time Calculation (min) (ACUTE ONLY): 21 min   Charges:   PT Evaluation $PT Eval Moderate Complexity: 1 Mod          Mylo RedShauna Lilas Diefendorf, PT, DPT Acute Rehabilitation Services Pager [phone] Office [phone]      Blake DivineShauna A Lanier EnsignHartshorne 11/06/2018, 2:53 PM

## 2018-11-06 NOTE — Progress Notes (Signed)
PT Cancellation Note  Patient Details [name] MRN: [id number] DOB: Nov 16, 1945   Cancelled Treatment:    Reason Eval/Treat Not Completed: Medical issues which prohibited therapy Holding PT eval per RN as pt sustaining HR in 140-150s in AM. Started on Cardizem drip. Will follow.   Blake Divine A Harshaan Whang 11/06/2018, 9:37 AM Mylo Red, PT, DPT Acute Rehabilitation Services Pager [phone] Office [phone]

## 2018-11-06 NOTE — Progress Notes (Addendum)
PROGRESS NOTE    Adrienne Stone  ZOX:[id number] DOB: 1946/05/29 DOA: 11/04/2018 PCP: Darryl Lentaylor, Amanda, PA-C    Brief Narrative:  73 year old female who presented with chills, cough, fatigue, nausea vomiting.  She does have significant past medical history for hypertension, depression and obesity.  Reported about 48 hours of fevers, fatigue, headaches, cough, nausea and vomiting.  She was placed on non-invasive mechanical ventilation and on her initial physical examination she was febrile 38.1 C, oxygen saturation was 90% on room air, respiratory rate was 30 breaths/min and heart rate 120 bpm, with a blood pressure of 237/100.  Moist mucous membranes, lungs with no wheezing, rales or rhonchi, no accessory muscle use, heart S1-S2 present rhythmic, abdomen soft nontender, no extremity edema.  Her chest x-ray had a small right pleural effusion with signs of hiatal hernia.  Chest CT negative for pleural embolism, positive bilateral groundglass opacities.  Her initial EKG shows sinus tachycardia, follow-up EKG atrial fibrillation.  Patient was admitted to the hospital with working diagnosis of acute hypoxic respiratory failure due to influenza pneumonia, complicated by sepsis, and atrial fibrillation.    Assessment & Plan:   Principal Problem:   Influenza A Active Problems:   Acute respiratory failure with hypoxia (HCC)   Acute respiratory distress   Mild renal insufficiency   Hypertensive urgency   Normocytic anemia   1. New onset atrial fibrillation with rapid ventricular response. Patient with HR in the 180, had not responded to metoprolol IV pushes, will start patient on continuous infusion of diltiazem and will increase metoprolol to 50 mg po bid, continue telemetry monitoring. Patient has hx of significant upper GI bleed in 2014, will hold on anticoagulation for now. Will check echocardiogram once rate is better controlled. Change B2 agonist only as needed.   1. Acute hypoxic  respiratory failure due to Influenza A pneumonia, complicated with sepsis (viral sepsis). Present on admission. Continue oxymetry monitoring and supplemental 02 per [place]. Oxymetry today at 97% on 2 LPM per [place]. Continue osetlamivir and as needed bronchodilator. Continue scheduled guaifenesin DM.  2. Uncontrolled HTN, urgency. Metoprolol increased to 50 mg po bid and now on diltiazem infusion. Continue blood pressure monitoring for now. At home on lisinopril and hctz. Will resume hctz for now.   3. Depression. Continue mirtazapine, escitalopram, venlafaxine, temazepam. No confusion or agitation.   4. Chronic multifactorial anemia. Hb and hct stable, hold on anticoagulation due to hx of upper GI bleeding.     DVT prophylaxis: enoxaparin   Code Status:  full Family Communication: no family at the bedside   Disposition Plan/ discharge barriers:  Pending clinical improvement.   Body mass index is 44.71 kg/m. Malnutrition Type:      Malnutrition Characteristics:      Nutrition Interventions:     RN Pressure Injury Documentation:     Consultants:     Procedures:     Antimicrobials:       Subjective: Patient not feeling well, positive weakness and palpitations, associated with dyspnea and diaphoresis, no nausea or vomiting.   Objective: Vitals:   11/06/18 0000 11/06/18 0127 11/06/18 0301 11/06/18 0923  BP:   (!) 170/117   Pulse: (!) 118  (!) 107 (!) 134  Resp: 14  (!) 27 18  Temp:   99.4 F (37.4 C)   TempSrc:   Oral   SpO2: 99% 97% 97% 97%  Weight:   114.5 kg   Height:        Intake/Output Summary (Last 24 hours)  at 11/06/2018 0931 Last data filed at 11/06/2018 0442 Gross per 24 hour  Intake 243 ml  Output 1000 ml  Net -757 ml   Filed Weights   11/05/18 0206 11/06/18 0301  Weight: 112.9 kg 114.5 kg    Examination:   General: deconditioned and ill looking appearing  Neurology: Awake and alert, non focal  E ENT: positive pallor, no icterus,  oral mucosa moist Cardiovascular: No JVD. S1-S2 present, rhythmic, no gallops, rubs, or murmurs. ++ bilateral lower extremity edema. Pulmonary: decreased breath sounds bilaterally, poor air movement, positive expiratory wheezing, with scattered rhonchi and rales. Gastrointestinal. Abdomen protuberant with no organomegaly, non tender, no rebound or guarding Skin. No rashes Musculoskeletal: no joint deformities     Data Reviewed: I have personally reviewed following labs and imaging studies  CBC: Recent Labs  Lab 11/04/18 1701 11/04/18 1728 11/04/18 2148 11/05/18 0337 11/06/18 0530  WBC 5.2  --   --  5.7 8.0  NEUTROABS 3.7  --   --  5.5 6.3  HGB 10.6* 11.2* 10.2* 10.5* 12.1  HCT 34.2* 33.0* 30.0* 32.9* 38.1  MCV 94.2  --   --  90.4 91.1  PLT 200  --   --  208 213   Basic Metabolic Panel: Recent Labs  Lab 11/04/18 1701 11/04/18 1728 11/04/18 2148 11/05/18 0337 11/06/18 0530  NA 137 141 139 139 139  K 3.6 3.9 4.2 4.4 4.4  CL 107  --   --  109 106  CO2 19*  --   --  20* 21*  GLUCOSE 115*  --   --  150* 108*  BUN 22  --   --  14 13  CREATININE 1.03*  --   --  1.01* 0.98  CALCIUM 8.8*  --   --  8.8* 9.1   GFR: Estimated Creatinine Clearance: 61.4 mL/min (by C-G formula based on SCr of 0.98 mg/dL). Liver Function Tests: Recent Labs  Lab 11/04/18 1701  AST 30  ALT 19  ALKPHOS 60  BILITOT 0.5  PROT 7.2  ALBUMIN 4.0   No results for input(s): LIPASE, AMYLASE in the last 168 hours. No results for input(s): AMMONIA in the last 168 hours. Coagulation Profile: No results for input(s): INR, PROTIME in the last 168 hours. Cardiac Enzymes: Recent Labs  Lab 11/04/18 1701  TROPONINI <0.03   BNP (last 3 results) No results for input(s): PROBNP in the last 8760 hours. HbA1C: No results for input(s): HGBA1C in the last 72 hours. CBG: Recent Labs  Lab 11/05/18 0808 11/05/18 1136 11/05/18 1623 11/05/18 2157 11/06/18 0754  GLUCAP 112* 101* 97 109* 123*   Lipid  Profile: No results for input(s): CHOL, HDL, LDLCALC, TRIG, CHOLHDL, LDLDIRECT in the last 72 hours. Thyroid Function Tests: No results for input(s): TSH, T4TOTAL, FREET4, T3FREE, THYROIDAB in the last 72 hours. Anemia Panel: Recent Labs    11/05/18 0400  VITAMINB12 1,953*  FOLATE 33.6  FERRITIN 136  TIBC 311  IRON 12*  RETICCTPCT 0.9      Radiology Studies: I have reviewed all of the imaging during this hospital visit personally     Scheduled Meds: . guaiFENesin-dextromethorphan  10 mL Oral Q6H  . metoprolol tartrate  50 mg Oral BID  . oseltamivir  30 mg Oral BID  . sodium chloride flush  3 mL Intravenous Q12H  . sodium chloride flush  3 mL Intravenous Q12H   Continuous Infusions: . sodium chloride    . diltiazem (CARDIZEM) infusion 5 mg/hr (11/06/18  [id number])     LOS: 1 day        Coralie KeensMauricio Daniel Taahir Grisby, MD [facility] Pager [phone]

## 2018-11-07 LAB — CBC WITH DIFFERENTIAL/PLATELET
Abs Immature Granulocytes: 0.05 10*3/uL (ref 0.00–0.07)
Basophils Absolute: 0 10*3/uL (ref 0.0–0.1)
Basophils Relative: 0 %
Eosinophils Absolute: 0 10*3/uL (ref 0.0–0.5)
Eosinophils Relative: 0 %
HCT: 36.4 % (ref 36.0–46.0)
Hemoglobin: 11.4 g/dL — ABNORMAL LOW (ref 12.0–15.0)
Immature Granulocytes: 1 %
Lymphocytes Relative: 10 %
Lymphs Abs: 1 10*3/uL (ref 0.7–4.0)
MCH: 28.6 pg (ref 26.0–34.0)
MCHC: 31.3 g/dL (ref 30.0–36.0)
MCV: 91.5 fL (ref 80.0–100.0)
Monocytes Absolute: 1.3 10*3/uL — ABNORMAL HIGH (ref 0.1–1.0)
Monocytes Relative: 14 %
Neutro Abs: 7 10*3/uL (ref 1.7–7.7)
Neutrophils Relative %: 75 %
Platelets: 221 10*3/uL (ref 150–400)
RBC: 3.98 MIL/uL (ref 3.87–5.11)
RDW: 13.4 % (ref 11.5–15.5)
WBC: 9.3 10*3/uL (ref 4.0–10.5)
nRBC: 0 % (ref 0.0–0.2)

## 2018-11-07 LAB — BASIC METABOLIC PANEL
Anion gap: 11 (ref 5–15)
BUN: 13 mg/dL (ref 8–23)
CO2: 25 mmol/L (ref 22–32)
Calcium: 9.2 mg/dL (ref 8.9–10.3)
Chloride: 100 mmol/L (ref 98–111)
Creatinine, Ser: 0.94 mg/dL (ref 0.44–1.00)
GFR calc Af Amer: >60 mL/min (ref >60)
GFR calc non Af Amer: 60 mL/min — ABNORMAL LOW (ref >60)
Glucose, Bld: 137 mg/dL — ABNORMAL HIGH (ref 70–99)
POTASSIUM: 3.6 mmol/L (ref 3.5–5.1)
Sodium: 136 mmol/L (ref 135–145)

## 2018-11-07 LAB — CULTURE, BLOOD (ROUTINE X 2): Special Requests: ADEQUATE

## 2018-11-07 LAB — GLUCOSE, CAPILLARY: Glucose-Capillary: 132 mg/dL — ABNORMAL HIGH (ref 70–99)

## 2018-11-07 MED ORDER — POTASSIUM CHLORIDE CRYS ER 20 MEQ PO TBCR
40.0000 meq | EXTENDED_RELEASE_TABLET | Freq: Once | ORAL | Status: AC
  Administered 2018-11-07: 40 meq via ORAL
  Filled 2018-11-07: qty 2

## 2018-11-07 MED ORDER — LISINOPRIL 5 MG PO TABS
5.0000 mg | ORAL_TABLET | Freq: Every day | ORAL | Status: DC
  Administered 2018-11-07 – 2018-11-19 (×13): 5 mg via ORAL
  Filled 2018-11-07 (×13): qty 1

## 2018-11-07 MED ORDER — DILTIAZEM HCL 60 MG PO TABS
60.0000 mg | ORAL_TABLET | Freq: Four times a day (QID) | ORAL | Status: DC
  Administered 2018-11-07 – 2018-11-08 (×3): 60 mg via ORAL
  Filled 2018-11-07 (×3): qty 1

## 2018-11-07 NOTE — Progress Notes (Signed)
PROGRESS NOTE    Adrienne Stone  ZOX:[id number] DOB: [birth year] DOA: 11/04/2018 PCP: Darryl Lentaylor, Amanda, PA-C    Brief Narrative:  73 year old female who presented with chills, cough, fatigue, nausea vomiting. She does have significant past medical history for hypertension, depression and obesity. Reported about 48 hours of fevers, fatigue, headaches, cough, nausea and vomiting. She was placed on non-invasive mechanical ventilation and onher initial physical examination she was febrile 38.1 C, oxygen saturation was 90% on room air, respiratory rate was 30 breaths/min and heart rate 120 bpm, with a blood pressure of 237/100. Moist mucous membranes, lungs with no wheezing, rales or rhonchi, no accessory muscle use, heart S1-S2 present rhythmic, abdomen soft nontender, no extremity edema. Her chest x-ray had a small right pleural effusion with signs of hiatal hernia. Chest CT negative for pleural embolism, positive bilateral groundglass opacities. Her initial EKG shows sinus tachycardia, follow-up EKG atrial fibrillation.  Patient was admitted to the hospital with working diagnosis of acute hypoxic respiratory failure due to influenza pneumonia, complicated by sepsis, and atrial fibrillation.    Assessment & Plan:   Principal Problem:   Influenza A Active Problems:   Acute respiratory failure with hypoxia (HCC)   Acute respiratory distress   Mild renal insufficiency   Hypertensive urgency   Normocytic anemia   1. New onset atrial fibrillation with rapid ventricular response. Improved rate control, patient has remained in atrial fibrillation rhythm, will continue av blockade with metoprolol and diltiazem, will change drip to po diltiazem 60 mg q 6 H, patient has required about 12.5 mg per H of diltiazem per IV drip. No anticoagulation due to hx of significant gi bleeding.   2. Acute hypoxic respiratory failure due to Influenza A pneumonia, complicated with sepsis (viral sepsis).  Present on admission. Improved dyspnea and oxygenation, will continue  oxymetry monitoring and supplemental 02 per [place]. As needed bronchodilators and scheduled cough suppressive therapy. Continue oseltamivir.   3. Uncontrolled HTN, urgency. Continue with metoprolol increased to 50 mg po bid. Will continue HCTZ and will resume low dose of lisinopril.  4. Depression. On  Mirtazapine and venlafaxine, per home regimen, medication reconciliation per pharmacy.  5. Chronic multifactorial anemia. Continue close follow up of cell count, no indication for transfusion.   6. Hypokalemia. Will target K of 4, will correct with Kcl, today K is 3,6 with preserved renal function with serum cr at 0.94  DVT prophylaxis:enoxaparin Code Status:full Family Communication:no family at the bedside   Disposition Plan/ discharge barriers:Pending clinical improvement.  Body mass index is 44.06 kg/m. Malnutrition Type:      Malnutrition Characteristics:      Nutrition Interventions:     RN Pressure Injury Documentation:     Consultants:     Procedures:     Antimicrobials:   oseltamivir    Subjective: Patient is feeling better but not back to baseline, continue on diltiazem drip. No chest pain, continue to be very weak and deconditioned. Dyspnea not completely resolved, improved with oxygen and nebs.   Objective: Vitals:   11/06/18 1847 11/06/18 2003 11/06/18 2145 11/07/18 0433  BP: (!) 190/102 (!) 173/93  (!) 179/98  Pulse: 76 (!) 133 (!) 128 95  Resp: 18 20  20   Temp: 98.2 F (36.8 C) 98.6 F (37 C)  98.4 F (36.9 C)  TempSrc: Oral Oral  Oral  SpO2: 95% 91%    Weight:    112.8 kg  Height:        Intake/Output Summary (Last 24  hours) at 11/07/2018 1037 Last data filed at 11/07/2018 0656 Gross per 24 hour  Intake 901.64 ml  Output 550 ml  Net 351.64 ml   Filed Weights   11/05/18 0206 11/06/18 0301 11/07/18 0433  Weight: 112.9 kg 114.5 kg 112.8 kg     Examination:   General: deconditioned and ill looking appearing  Neurology: Awake and alert, non focal  E ENT: positive pallor, no icterus, oral mucosa moist Cardiovascular: No JVD. S1-S2 present, irregularly irregular, no gallops, rubs, or murmurs. Trace non pitting lower extremity edema. Pulmonary: decreased breath sounds bilaterally, poor air movement, positive expiratory wheezing, with scattered rhonchi and rales. Gastrointestinal. Abdomen protuberant with no organomegaly, non tender, no rebound or guarding Skin. No rashes Musculoskeletal: no joint deformities     Data Reviewed: I have personally reviewed following labs and imaging studies  CBC: Recent Labs  Lab 11/04/18 1701 11/04/18 1728 11/04/18 2148 11/05/18 0337 11/06/18 0530 11/07/18 0441  WBC 5.2  --   --  5.7 8.0 9.3  NEUTROABS 3.7  --   --  5.5 6.3 7.0  HGB 10.6* 11.2* 10.2* 10.5* 12.1 11.4*  HCT 34.2* 33.0* 30.0* 32.9* 38.1 36.4  MCV 94.2  --   --  90.4 91.1 91.5  PLT 200  --   --  208 213 221   Basic Metabolic Panel: Recent Labs  Lab 11/04/18 1701 11/04/18 1728 11/04/18 2148 11/05/18 0337 11/06/18 0530 11/07/18 0441  NA 137 141 139 139 139 136  K 3.6 3.9 4.2 4.4 4.4 3.6  CL 107  --   --  109 106 100  CO2 19*  --   --  20* 21* 25  GLUCOSE 115*  --   --  150* 108* 137*  BUN 22  --   --  14 13 13   CREATININE 1.03*  --   --  1.01* 0.98 0.94  CALCIUM 8.8*  --   --  8.8* 9.1 9.2   GFR: Estimated Creatinine Clearance: 63.5 mL/min (by C-G formula based on SCr of 0.94 mg/dL). Liver Function Tests: Recent Labs  Lab 11/04/18 1701  AST 30  ALT 19  ALKPHOS 60  BILITOT 0.5  PROT 7.2  ALBUMIN 4.0   No results for input(s): LIPASE, AMYLASE in the last 168 hours. No results for input(s): AMMONIA in the last 168 hours. Coagulation Profile: No results for input(s): INR, PROTIME in the last 168 hours. Cardiac Enzymes: Recent Labs  Lab 11/04/18 1701  TROPONINI <0.03   BNP (last 3 results) No  results for input(s): PROBNP in the last 8760 hours. HbA1C: No results for input(s): HGBA1C in the last 72 hours. CBG: Recent Labs  Lab 11/05/18 1623 11/05/18 2157 11/06/18 0754 11/06/18 1107 11/07/18 0757  GLUCAP 97 109* 123* 123* 132*   Lipid Profile: No results for input(s): CHOL, HDL, LDLCALC, TRIG, CHOLHDL, LDLDIRECT in the last 72 hours. Thyroid Function Tests: No results for input(s): TSH, T4TOTAL, FREET4, T3FREE, THYROIDAB in the last 72 hours. Anemia Panel: Recent Labs    11/05/18 0400  VITAMINB12 1,953*  FOLATE 33.6  FERRITIN 136  TIBC 311  IRON 12*  RETICCTPCT 0.9      Radiology Studies: I have reviewed all of the imaging during this hospital visit personally     Scheduled Meds: . guaiFENesin-dextromethorphan  10 mL Oral Q6H  . hydrochlorothiazide  25 mg Oral Daily  . metoprolol tartrate  50 mg Oral BID  . mirtazapine  45 mg Oral QHS  . oseltamivir  30 mg Oral BID  . sodium chloride flush  3 mL Intravenous Q12H  . sodium chloride flush  3 mL Intravenous Q12H  . venlafaxine  50 mg Oral BID WC   Continuous Infusions: . sodium chloride    . diltiazem (CARDIZEM) infusion 12.5 mg/hr (11/07/18 0252)     LOS: 2 days         Annett Gula, MD

## 2018-11-08 DIAGNOSIS — J189 Pneumonia, unspecified organism: Secondary | ICD-10-CM

## 2018-11-08 DIAGNOSIS — A419 Sepsis, unspecified organism: Secondary | ICD-10-CM

## 2018-11-08 LAB — GLUCOSE, CAPILLARY: Glucose-Capillary: 111 mg/dL — ABNORMAL HIGH (ref 70–99)

## 2018-11-08 LAB — BASIC METABOLIC PANEL
Anion gap: 8 (ref 5–15)
BUN: 11 mg/dL (ref 8–23)
CO2: 30 mmol/L (ref 22–32)
Calcium: 9.1 mg/dL (ref 8.9–10.3)
Chloride: 97 mmol/L — ABNORMAL LOW (ref 98–111)
Creatinine, Ser: 0.73 mg/dL (ref 0.44–1.00)
GFR calc Af Amer: >60 mL/min (ref >60)
GFR calc non Af Amer: >60 mL/min (ref >60)
Glucose, Bld: 138 mg/dL — ABNORMAL HIGH (ref 70–99)
Potassium: 4 mmol/L (ref 3.5–5.1)
Sodium: 135 mmol/L (ref 135–145)

## 2018-11-08 LAB — MRSA PCR SCREENING: MRSA BY PCR: NEGATIVE

## 2018-11-08 MED ORDER — DILTIAZEM HCL ER COATED BEADS 240 MG PO CP24
240.0000 mg | ORAL_CAPSULE | Freq: Every day | ORAL | Status: DC
  Administered 2018-11-08 – 2018-11-10 (×3): 240 mg via ORAL
  Filled 2018-11-08 (×3): qty 1

## 2018-11-08 NOTE — Progress Notes (Signed)
PROGRESS NOTE    Adrienne Stone  UJW:[id number] DOB: Jun 28, 1946 DOA: 11/04/2018 PCP: Darryl Lentaylor, Amanda, PA-C    Brief Narrative:  73 year old female who presented with chills, cough, fatigue, nausea vomiting. She does have significant past medical history for hypertension, depression and obesity. Reported about 48 hours of fevers, fatigue, headaches, cough, nausea and vomiting. She was placed on non-invasive mechanical ventilation and onher initial physical examination she was febrile 38.1 C, oxygen saturation was 90% on room air, respiratory rate was 30 breaths/min and heart rate 120 bpm, with a blood pressure of 237/100. Moist mucous membranes, lungs with no wheezing, rales or rhonchi, no accessory muscle use, heart S1-S2 present rhythmic, abdomen soft nontender, no extremity edema. Her chest x-ray had a small right pleural effusion with signs of hiatal hernia. Chest CT negative for pleural embolism, positive bilateral groundglass opacities. Her initial EKG shows sinus tachycardia, follow-up EKG atrial fibrillation.  Patient was admitted to the hospital with working diagnosis of acute hypoxic respiratory failure due to influenza pneumonia, complicated by sepsis, and atrial fibrillation.    Assessment & Plan:   Principal Problem:   Influenza A Active Problems:   Acute respiratory failure with hypoxia (HCC)   Acute respiratory distress   Mild renal insufficiency   Hypertensive urgency   Normocytic anemia   1. New onset atrial fibrillation with rapid ventricular response. Telemetry personally reviewed, patient has remained in atrial fibrillation with HR in the low 100 upper 90's, will continue AV blockade with metoprolol and will change diltiazem to long acting. Will continue telemetry monitoring, no signs of volume overload. Out of bed as tolerated. Will check echocardiogram. No anticoagulation due to history of severe GI bleeding in the past.   2. Acute hypoxic respiratory  failure due to Influenza A pneumonia, complicated with sepsis (viral sepsis). Present on admission. Continue oseltamivir for now. Patient had fever spike last night, will continue to wean off supplemental 02, this am at 4 LPM. Target 02 saturation greater then 92%. No clinical signs of bacterial over infection. Continue as needed bronchodilator therapy.   3. Uncontrolled HTN, urgency. This am with improved blood pressure, 152/75, will continue metoprolol, diltiazem, lisinopril and hydrochlorothiazide. No clinical signs of volume overload.   4. Depression. No confusion or agitation continue with Mirtazapine and venlafaxine.  5. Chronic multifactorial anemia.HB and Hct have remained stable with hb at 11,4 and Hct at 36.   6. Hypokalemia. K today at 4, will continue to follow on renal panel in 48 H, patient is tolerating po well, off iv fluids.   DVT prophylaxis:enoxaparin Code Status:full Family Communication:no family at the bedside  Body mass index is 44.09 kg/m. Malnutrition Type:      Malnutrition Characteristics:      Nutrition Interventions:     RN Pressure Injury Documentation:     Consultants:     Procedures:     Antimicrobials:   Oseltamivir     Subjective: Patient feeling better this am, but not back to baseline, continue to have cough and dyspnea. Positive fever last night, has not been out of the bed.   Objective: Vitals:   11/07/18 1820 11/07/18 2014 11/08/18 0016 11/08/18 0516  BP:  (!) 192/102 (!) 182/97 (!) 194/104  Pulse:  99 (!) 108 (!) 105  Resp:  20 20 20   Temp:  (!) 100.6 F (38.1 C) 97.6 F (36.4 C) 98.4 F (36.9 C)  TempSrc:  Oral Oral Oral  SpO2: 99% 98% 97% 99%  Weight:  112.9 kg  Height:        Intake/Output Summary (Last 24 hours) at 11/08/2018 0823 Last data filed at 11/08/2018 0810 Gross per 24 hour  Intake -  Output 1950 ml  Net -1950 ml   Filed Weights   11/06/18 0301 11/07/18 0433 11/08/18 0516    Weight: 114.5 kg 112.8 kg 112.9 kg    Examination:   General: deconditioned.  Neurology: Awake and alert, non focal  E ENT: mild pallor, no icterus, oral mucosa moist Cardiovascular: No JVD. S1-S2 present, irregularly irregular, no gallops, rubs, or murmurs. Trace lower extremity edema. Pulmonary: positive breath sounds bilaterally, decreased air movement, no wheezing, bilateral rhonchi and rales, diffuse. Gastrointestinal. Abdomen protuberant, no organomegaly, non tender, no rebound or guarding Skin. No rashes Musculoskeletal: no joint deformities     Data Reviewed: I have personally reviewed following labs and imaging studies  CBC: Recent Labs  Lab 11/04/18 1701 11/04/18 1728 11/04/18 2148 11/05/18 0337 11/06/18 0530 11/07/18 0441  WBC 5.2  --   --  5.7 8.0 9.3  NEUTROABS 3.7  --   --  5.5 6.3 7.0  HGB 10.6* 11.2* 10.2* 10.5* 12.1 11.4*  HCT 34.2* 33.0* 30.0* 32.9* 38.1 36.4  MCV 94.2  --   --  90.4 91.1 91.5  PLT 200  --   --  208 213 221   Basic Metabolic Panel: Recent Labs  Lab 11/04/18 1701  11/04/18 2148 11/05/18 0337 11/06/18 0530 11/07/18 0441 11/08/18 0346  NA 137   < > 139 139 139 136 135  K 3.6   < > 4.2 4.4 4.4 3.6 4.0  CL 107  --   --  109 106 100 97*  CO2 19*  --   --  20* 21* 25 30  GLUCOSE 115*  --   --  150* 108* 137* 138*  BUN 22  --   --  14 13 13 11   CREATININE 1.03*  --   --  1.01* 0.98 0.94 0.73  CALCIUM 8.8*  --   --  8.8* 9.1 9.2 9.1   < > = values in this interval not displayed.   GFR: Estimated Creatinine Clearance: 74.6 mL/min (by C-G formula based on SCr of 0.73 mg/dL). Liver Function Tests: Recent Labs  Lab 11/04/18 1701  AST 30  ALT 19  ALKPHOS 60  BILITOT 0.5  PROT 7.2  ALBUMIN 4.0   No results for input(s): LIPASE, AMYLASE in the last 168 hours. No results for input(s): AMMONIA in the last 168 hours. Coagulation Profile: No results for input(s): INR, PROTIME in the last 168 hours. Cardiac Enzymes: Recent Labs   Lab 11/04/18 1701  TROPONINI <0.03   BNP (last 3 results) No results for input(s): PROBNP in the last 8760 hours. HbA1C: No results for input(s): HGBA1C in the last 72 hours. CBG: Recent Labs  Lab 11/05/18 2157 11/06/18 0754 11/06/18 1107 11/07/18 0757 11/08/18 0758  GLUCAP 109* 123* 123* 132* 111*   Lipid Profile: No results for input(s): CHOL, HDL, LDLCALC, TRIG, CHOLHDL, LDLDIRECT in the last 72 hours. Thyroid Function Tests: No results for input(s): TSH, T4TOTAL, FREET4, T3FREE, THYROIDAB in the last 72 hours. Anemia Panel: No results for input(s): VITAMINB12, FOLATE, FERRITIN, TIBC, IRON, RETICCTPCT in the last 72 hours.    Radiology Studies: I have reviewed all of the imaging during this hospital visit personally     Scheduled Meds: . diltiazem  60 mg Oral Q6H  . guaiFENesin-dextromethorphan  10 mL Oral Q6H  .  hydrochlorothiazide  25 mg Oral Daily  . lisinopril  5 mg Oral Daily  . metoprolol tartrate  50 mg Oral BID  . mirtazapine  45 mg Oral QHS  . oseltamivir  30 mg Oral BID  . sodium chloride flush  3 mL Intravenous Q12H  . sodium chloride flush  3 mL Intravenous Q12H  . venlafaxine  50 mg Oral BID WC   Continuous Infusions: . sodium chloride    . diltiazem (CARDIZEM) infusion Stopped (11/07/18 1121)     LOS: 3 days         Annett Gulaaniel , MD

## 2018-11-09 ENCOUNTER — Inpatient Hospital Stay (HOSPITAL_COMMUNITY): Payer: Medicare HMO

## 2018-11-09 DIAGNOSIS — I4891 Unspecified atrial fibrillation: Secondary | ICD-10-CM

## 2018-11-09 LAB — CULTURE, BLOOD (ROUTINE X 2)
Culture: NO GROWTH
Special Requests: ADEQUATE

## 2018-11-09 LAB — ECHOCARDIOGRAM COMPLETE
Height: 63 in
Weight: 3955.2 oz

## 2018-11-09 LAB — GLUCOSE, CAPILLARY: GLUCOSE-CAPILLARY: 228 mg/dL — AB (ref 70–99)

## 2018-11-09 MED ORDER — LORAZEPAM 1 MG PO TABS
1.0000 mg | ORAL_TABLET | Freq: Three times a day (TID) | ORAL | Status: DC | PRN
  Administered 2018-11-09 – 2018-11-19 (×5): 1 mg via ORAL
  Filled 2018-11-09 (×6): qty 1

## 2018-11-09 NOTE — Progress Notes (Signed)
Physical Therapy Treatment Patient Details [name] MRN: [id number] DOB: 1946-06-15 Today's Date: 11/09/2018    History of Present Illness Patient is a 73 y/o female who presents with cough, sore throat, SOB. Admitted with acute respiratory failure due to + flu A, PNA complicated by sepsis and A-fib. CTA chest- patchy bil opacities suspicious for infectious or inflammatory PNA. PMH includes HTN, depression, hypercholesteremia.    PT Comments    Patient progressing slowly towards PT goals. Pt with + dizziness and nausea upon standing. Reports feeling nervous and fearful of falling. Tolerated short bouts of ambulation within the room with longer seated rest breaks. Fatigues very quickly. HR up to 121 bpm; Sp02 remained >92% on RA throughout. Discussed concerns about going home alone and pt reports her sister can come stay with her at d/c. Encouraged increasing activity. Will follow and progress.     Follow Up Recommendations  Home health PT;Supervision for mobility/OOB     Equipment Recommendations  3in1 (PT)    Recommendations for Other Services       Precautions / Restrictions Precautions Precautions: Fall Precaution Comments: watch HR Restrictions Weight Bearing Restrictions: No    Mobility  Bed Mobility Overal bed mobility: Needs Assistance Bed Mobility: Rolling;Sidelying to Sit Rolling: Min guard Sidelying to sit: Min guard;HOB elevated       General bed mobility comments: Use of rail but no assist needed.   Transfers Overall transfer level: Needs assistance Equipment used: Rolling walker (2 wheeled) Transfers: Sit to/from Stand Sit to Stand: Min guard         General transfer comment: Min guard for safety. Stood from EOB x1 with LOB onto bed due to dizziness. Stood from EOB again, SPT to chair, stood from chair x3  Ambulation/Gait Ambulation/Gait assistance: Min guard Gait Distance (Feet): 10 Feet(x2 bouts) Assistive device: Rolling walker (2  wheeled) Gait Pattern/deviations: Step-through pattern;Wide base of support;Decreased stride length Gait velocity: decreased   General Gait Details: Slow, mildly unsteady gait with RW for support, HR up to 120 bpm A-fib. Sp02 remained >92% on RA throughout. + nausea upon standing.   Stairs             Wheelchair Mobility    Modified Rankin (Stroke Patients Only)       Balance Overall balance assessment: Needs assistance Sitting-balance support: Feet supported;No upper extremity supported Sitting balance-Leahy Scale: Fair     Standing balance support: During functional activity;Bilateral upper extremity supported Standing balance-Leahy Scale: Poor Standing balance comment: Reliant on BUEs for support in standing. Attempted to get BP but not reading.                            Cognition Arousal/Alertness: Awake/alert Behavior During Therapy: Anxious Overall Cognitive Status: Within Functional Limits for tasks assessed                                 General Comments: Reports being fearful of falling      Exercises      General Comments        Pertinent Vitals/Pain Pain Assessment: Faces Faces Pain Scale: Hurts little more Pain Location: chronic back pain Pain Descriptors / Indicators: Discomfort;Constant Pain Intervention(s): Monitored during session;Repositioned    Home Living                      Prior Function  PT Goals (current goals can now be found in the care plan section) Progress towards PT goals: Progressing toward goals(slowly)    Frequency    Min 3X/week      PT Plan Current plan remains appropriate    Co-evaluation              AM-PAC PT "6 Clicks" Mobility   Outcome Measure  Help needed turning from your back to your side while in a flat bed without using bedrails?: A Little Help needed moving from lying on your back to sitting on the side of a flat bed without using  bedrails?: A Little Help needed moving to and from a bed to a chair (including a wheelchair)?: A Little Help needed standing up from a chair using your arms (e.g., wheelchair or bedside chair)?: A Little Help needed to walk in hospital room?: A Little Help needed climbing 3-5 steps with a railing? : A Lot 6 Click Score: 17    End of Session Equipment Utilized During Treatment: Gait belt;Oxygen Activity Tolerance: Patient limited by fatigue Patient left: in chair;with call bell/phone within reach Nurse Communication: Mobility status;Other (comment)(leaving pt on room air) PT Visit Diagnosis: Difficulty in walking, not elsewhere classified (R26.2)     Time: [id number] PT Time Calculation (min) (ACUTE ONLY): 34 min  Charges:  $Gait Training: 23-37 mins                     Mylo RedShauna Dasha Kawabata, PT, DPT Acute Rehabilitation Services Pager [phone] Office [phone]       Marcy PanningShauna A Janiya Millirons 11/09/2018, 12:37 PM

## 2018-11-09 NOTE — Progress Notes (Signed)
PT Cancellation Note  Patient Details [name] MRN: [id number] DOB: 11-Feb-1946   Cancelled Treatment:    Reason Eval/Treat Not Completed: Patient at procedure or test/unavailable Pt getting ECHO. Will follow up as time allows.   Blake Divine A Autumn Pruitt 11/09/2018, 10:42 AM Mylo Red, PT, DPT Acute Rehabilitation Services Pager [phone] Office [phone]

## 2018-11-09 NOTE — Progress Notes (Signed)
  Echocardiogram 2D Echocardiogram has been performed.  Janalyn Harder 11/09/2018, 11:08 AM

## 2018-11-09 NOTE — Progress Notes (Signed)
PROGRESS NOTE    Adrienne Stone  [id number] DOB: Jul 27, 1946 DOA: 11/04/2018 PCP: Darryl Lentaylor, Amanda, PA-C    Brief Narrative:  73 year old female who presented with chills, cough, fatigue, nausea vomiting. She does have significant past medical history for hypertension, depression and obesity. Reported about 48 hours of fevers, fatigue, headaches, cough, nausea and vomiting. She was placed on non-invasive mechanical ventilation and onher initial physical examination she was febrile 38.1 C, oxygen saturation was 90% on room air, respiratory rate was 30 breaths/min and heart rate 120 bpm, with a blood pressure of 237/100. Moist mucous membranes, lungs with no wheezing, rales or rhonchi, no accessory muscle use, heart S1-S2 present rhythmic, abdomen soft nontender, no extremity edema. Her chest x-ray had a small right pleural effusion with signs of hiatal hernia. Chest CT negative for pleural embolism, positive bilateral groundglass opacities. Her initial EKG shows sinus tachycardia, follow-up EKG atrial fibrillation.  Patient was admitted to the hospital with working diagnosis of acute hypoxic respiratory failure due to influenza pneumonia, complicated by sepsis, and atrial fibrillation.   Assessment & Plan:   Principal Problem:   Influenza A Active Problems:   Acute respiratory failure with hypoxia (HCC)   Acute respiratory distress   Hypertensive urgency   Normocytic anemia   1. New onset atrial fibrillation with rapid ventricular response.Patient today back on rapid ventricular response (RVR), will add lorazepam and continue telemetry monitor on current dose of metoprolol and diltiazem, with plan to increase metoprolol to 75 mg if continue RVR. Clinically no signs of volume overload, will follow on echocardiography today. No anticoagulation due to hx of significant gi bleeding.   2. Acute hypoxic respiratory failure due to Influenza A pneumonia, complicated with sepsis  (viral sepsis). Present on admission. Improved oxygen requirements to 2 LPM, will continue oxymetry monitoring and wean off supplemental 02. Patient has remained afebrile for the last 24 H. Completed course with oseltamivir.   3. Uncontrolled HTN, urgency. Blood pressure systolic today 160 to 190, will continue hctz, lisinopril, diltiazem and metoprolol.   4. Depression.Positive anxiety, at home patient on as needed lorazepam, that will be resumed, continue withMirtazapine andvenlafaxine.  5. Chronic multifactorial anemia.Will follow cell count in am.   6. Hypokalemia. Follow on renal panel in am, patient with very poor appetite.    DVT prophylaxis:enoxaparin Code Status:full Family Communication:no family at the bedside   Body mass index is 43.79 kg/m. Malnutrition Type:      Malnutrition Characteristics:      Nutrition Interventions:     RN Pressure Injury Documentation:     Consultants:     Procedures:     Antimicrobials:   Oseltamivir completed    Subjective: Patient is not feeling well, last night had palpitations and RVR, continue to be very weak and deconditioned, no nausea or vomiting, improved dyspnea but not back to baseline, continue to use supplemental 02 per [place].   Objective: Vitals:   11/08/18 2240 11/08/18 2300 11/09/18 0445 11/09/18 0500  BP:  (!) 193/83 (!) 160/86   Pulse: (!) 110 (!) 102 (!) 117   Resp:  18 18   Temp:  98.1 F (36.7 C) 98.8 F (37.1 C)   TempSrc:  Oral Oral   SpO2:  93% 96%   Weight:    112.1 kg  Height:        Intake/Output Summary (Last 24 hours) at 11/09/2018 0936 Last data filed at 11/09/2018 0500 Gross per 24 hour  Intake 120 ml  Output 1200 ml  Net -1080 ml   Filed Weights   11/07/18 0433 11/08/18 0516 11/09/18 0500  Weight: 112.8 kg 112.9 kg 112.1 kg    Examination:   General: deconditioned  Neurology: Awake and alert, non focal  E ENT: mild pallor, no icterus, oral mucosa  moist Cardiovascular: No JVD. S1-S2 present, tachycardic, irregularly irregular with no gallops, rubs, or murmurs. Trace lower extremity edema. Pulmonary:  Positive breath sounds bilaterally, decreased air movement, no wheezing, but bilateral scattered rhonchi and rales. Gastrointestinal. Abdomen protuberant no organomegaly, non tender, no rebound or guarding Skin. No rashes Musculoskeletal: no joint deformities     Data Reviewed: I have personally reviewed following labs and imaging studies  CBC: Recent Labs  Lab 11/04/18 1701 11/04/18 1728 11/04/18 2148 11/05/18 0337 11/06/18 0530 11/07/18 0441  WBC 5.2  --   --  5.7 8.0 9.3  NEUTROABS 3.7  --   --  5.5 6.3 7.0  HGB 10.6* 11.2* 10.2* 10.5* 12.1 11.4*  HCT 34.2* 33.0* 30.0* 32.9* 38.1 36.4  MCV 94.2  --   --  90.4 91.1 91.5  PLT 200  --   --  208 213 221   Basic Metabolic Panel: Recent Labs  Lab 11/04/18 1701  11/04/18 2148 11/05/18 0337 11/06/18 0530 11/07/18 0441 11/08/18 0346  NA 137   < > 139 139 139 136 135  K 3.6   < > 4.2 4.4 4.4 3.6 4.0  CL 107  --   --  109 106 100 97*  CO2 19*  --   --  20* 21* 25 30  GLUCOSE 115*  --   --  150* 108* 137* 138*  BUN 22  --   --  14 13 13 11   CREATININE 1.03*  --   --  1.01* 0.98 0.94 0.73  CALCIUM 8.8*  --   --  8.8* 9.1 9.2 9.1   < > = values in this interval not displayed.   GFR: Estimated Creatinine Clearance: 74.3 mL/min (by C-G formula based on SCr of 0.73 mg/dL). Liver Function Tests: Recent Labs  Lab 11/04/18 1701  AST 30  ALT 19  ALKPHOS 60  BILITOT 0.5  PROT 7.2  ALBUMIN 4.0   No results for input(s): LIPASE, AMYLASE in the last 168 hours. No results for input(s): AMMONIA in the last 168 hours. Coagulation Profile: No results for input(s): INR, PROTIME in the last 168 hours. Cardiac Enzymes: Recent Labs  Lab 11/04/18 1701  TROPONINI <0.03   BNP (last 3 results) No results for input(s): PROBNP in the last 8760 hours. HbA1C: No results for  input(s): HGBA1C in the last 72 hours. CBG: Recent Labs  Lab 11/06/18 0754 11/06/18 1107 11/07/18 0757 11/08/18 0758 11/09/18 0728  GLUCAP 123* 123* 132* 111* 228*   Lipid Profile: No results for input(s): CHOL, HDL, LDLCALC, TRIG, CHOLHDL, LDLDIRECT in the last 72 hours. Thyroid Function Tests: No results for input(s): TSH, T4TOTAL, FREET4, T3FREE, THYROIDAB in the last 72 hours. Anemia Panel: No results for input(s): VITAMINB12, FOLATE, FERRITIN, TIBC, IRON, RETICCTPCT in the last 72 hours.    Radiology Studies: I have reviewed all of the imaging during this hospital visit personally     Scheduled Meds: . diltiazem  240 mg Oral Daily  . guaiFENesin-dextromethorphan  10 mL Oral Q6H  . hydrochlorothiazide  25 mg Oral Daily  . lisinopril  5 mg Oral Daily  . metoprolol tartrate  50 mg Oral BID  . mirtazapine  45 mg Oral QHS  .  oseltamivir  30 mg Oral BID  . sodium chloride flush  3 mL Intravenous Q12H  . sodium chloride flush  3 mL Intravenous Q12H  . venlafaxine  50 mg Oral BID WC   Continuous Infusions: . sodium chloride       LOS: 4 days        Taletha Twiford Annett Gulaaniel Denean Pavon, MD

## 2018-11-10 DIAGNOSIS — I4891 Unspecified atrial fibrillation: Secondary | ICD-10-CM

## 2018-11-10 DIAGNOSIS — I160 Hypertensive urgency: Secondary | ICD-10-CM

## 2018-11-10 DIAGNOSIS — J101 Influenza due to other identified influenza virus with other respiratory manifestations: Secondary | ICD-10-CM

## 2018-11-10 LAB — CBC WITH DIFFERENTIAL/PLATELET
Abs Immature Granulocytes: 0.04 10*3/uL (ref 0.00–0.07)
BASOS PCT: 0 %
Basophils Absolute: 0 10*3/uL (ref 0.0–0.1)
Eosinophils Absolute: 0.1 10*3/uL (ref 0.0–0.5)
Eosinophils Relative: 1 %
HCT: 36.9 % (ref 36.0–46.0)
Hemoglobin: 11.9 g/dL — ABNORMAL LOW (ref 12.0–15.0)
Immature Granulocytes: 1 %
Lymphocytes Relative: 13 %
Lymphs Abs: 1 10*3/uL (ref 0.7–4.0)
MCH: 28.5 pg (ref 26.0–34.0)
MCHC: 32.2 g/dL (ref 30.0–36.0)
MCV: 88.3 fL (ref 80.0–100.0)
Monocytes Absolute: 0.7 10*3/uL (ref 0.1–1.0)
Monocytes Relative: 10 %
Neutro Abs: 5.9 10*3/uL (ref 1.7–7.7)
Neutrophils Relative %: 75 %
PLATELETS: 369 10*3/uL (ref 150–400)
RBC: 4.18 MIL/uL (ref 3.87–5.11)
RDW: 12.7 % (ref 11.5–15.5)
WBC: 7.7 10*3/uL (ref 4.0–10.5)
nRBC: 0 % (ref 0.0–0.2)

## 2018-11-10 LAB — BASIC METABOLIC PANEL
Anion gap: 12 (ref 5–15)
BUN: 17 mg/dL (ref 8–23)
CO2: 28 mmol/L (ref 22–32)
Calcium: 9.3 mg/dL (ref 8.9–10.3)
Chloride: 90 mmol/L — ABNORMAL LOW (ref 98–111)
Creatinine, Ser: 0.77 mg/dL (ref 0.44–1.00)
GFR calc Af Amer: >60 mL/min (ref >60)
Glucose, Bld: 140 mg/dL — ABNORMAL HIGH (ref 70–99)
Potassium: 4 mmol/L (ref 3.5–5.1)
Sodium: 130 mmol/L — ABNORMAL LOW (ref 135–145)

## 2018-11-10 LAB — GLUCOSE, CAPILLARY: Glucose-Capillary: 130 mg/dL — ABNORMAL HIGH (ref 70–99)

## 2018-11-10 MED ORDER — METOPROLOL TARTRATE 25 MG PO TABS
25.0000 mg | ORAL_TABLET | Freq: Once | ORAL | Status: AC
  Administered 2018-11-10: 25 mg via ORAL
  Filled 2018-11-10: qty 1

## 2018-11-10 MED ORDER — METOPROLOL TARTRATE 50 MG PO TABS
75.0000 mg | ORAL_TABLET | Freq: Two times a day (BID) | ORAL | Status: DC
  Administered 2018-11-10 – 2018-11-19 (×17): 75 mg via ORAL
  Filled 2018-11-10 (×18): qty 1

## 2018-11-10 MED ORDER — TEMAZEPAM 7.5 MG PO CAPS
30.0000 mg | ORAL_CAPSULE | Freq: Every evening | ORAL | Status: DC | PRN
  Administered 2018-11-10 – 2018-11-18 (×9): 30 mg via ORAL
  Filled 2018-11-10 (×9): qty 4

## 2018-11-10 NOTE — Consult Note (Addendum)
Cardiology Consultation:   Patient ID: Adrienne Stone MRN: [id number]; DOB: 08-31-1946  Admit date: 11/04/2018 Date of Consult: 11/10/2018  Primary Care Provider: [name], PA-C Primary Cardiologist: [facility]  Patient Profile:   Adrienne Stone is a 73 y.o. female with a hx of hypertension, hyperlipidemia, morbid obesity, status post gastric bypass and prior significant GI bleed who is being seen today for the evaluation of atrial fibrillation at the request of Dr. Ella Jubilee.  Patient is followed by cardiologist at [facility] for elevated blood pressure.  Echocardiogram 6 months ago was reassuring with normal LV function.  Otherwise, denies any cardiac history.  History of Present Illness:   [name] presented 1/29 with few days history of chills, cough, fatigue and vomiting.  She was admitted with positive influenza and pneumonia.  Treated with Tamiflu and broad-spectrum antibiotic.  Hospital course complicated by viral sepsis.  Her blood pressure continue to be elevated.  She was also diagnosed with new onset atrial fibrillation this admission.  Rate fairly controlled between 90-120s with intermittent elevation.  She is not anticoagulated due to prior GI bleed.  Her metoprolol increased to 75 twice daily and diltiazem 240 mg daily.  Rate currently stable in the 80s.  Patient denies palpitation, chest pain, orthopnea, PND, syncope or melena. Breathing has been improving.  Past Medical History:  Diagnosis Date  . Depression   . Hypercholesteremia   . Hypertension     Past Surgical History:  Procedure Laterality Date  . ABDOMINAL HYSTERECTOMY    . CHOLECYSTECTOMY    . GASTRIC BYPASS    . thumb surgery      Inpatient Medications: Scheduled Meds: . diltiazem  240 mg Oral Daily  . guaiFENesin-dextromethorphan  10 mL Oral Q6H  . hydrochlorothiazide  25 mg Oral Daily  . lisinopril  5 mg Oral Daily  . metoprolol tartrate  75 mg Oral BID  .  mirtazapine  45 mg Oral QHS  . sodium chloride flush  3 mL Intravenous Q12H  . sodium chloride flush  3 mL Intravenous Q12H  . venlafaxine  50 mg Oral BID WC   Continuous Infusions: . sodium chloride     PRN Meds: sodium chloride, acetaminophen **OR** acetaminophen, benzonatate, bisacodyl, hydrALAZINE, ipratropium-albuterol, LORazepam, morphine injection, ondansetron (ZOFRAN) IV, polyethylene glycol, sodium chloride flush, temazepam, traMADol  Allergies:    Allergies  Allergen Reactions  . Demerol [Meperidine] Rash  . Penicillins Rash  . Prozac [Fluoxetine Hcl] Palpitations  . Serzone [Nefazodone] Rash  . Sulfa Antibiotics Rash    Social History:   Social History   Socioeconomic History  . Marital status: Divorced    Spouse name: Not on file  . Number of children: Not on file  . Years of education: Not on file  . Highest education level: Not on file  Occupational History  . Not on file  Social Needs  . Financial resource strain: Not on file  . Food insecurity:    Worry: Not on file    Inability: Not on file  . Transportation needs:    Medical: Not on file    Non-medical: Not on file  Tobacco Use  . Smoking status: Former Games developer  . Smokeless tobacco: Never Used  Substance and Sexual Activity  . Alcohol use: Yes    Comment: daily  . Drug use: No  . Sexual activity: Not on file  Lifestyle  . Physical activity:    Days per week: Not on file  Minutes per session: Not on file  . Stress: Not on file  Relationships  . Social connections:    Talks on phone: Not on file    Gets together: Not on file    Attends religious service: Not on file    Active member of club or organization: Not on file    Attends meetings of clubs or organizations: Not on file    Relationship status: Not on file  . Intimate partner violence:    Fear of current or ex partner: Not on file    Emotionally abused: Not on file    Physically abused: Not on file    Forced sexual activity: Not  on file  Other Topics Concern  . Not on file  Social History Narrative  . Not on file    Family History:   Denies family history of CAD. Sister has high blood pressure  ROS:  Please see the history of present illness.  All other ROS reviewed and negative.     Physical Exam/Data:   Vitals:   11/10/18 0407 11/10/18 0411 11/10/18 0421 11/10/18 1308  BP:  (!) 157/108 111/66 (!) 185/104  Pulse:  (!) 121 73 81  Resp:  (!) 21  18  Temp:  98.9 F (37.2 C) 98 F (36.7 C) 98 F (36.7 C)  TempSrc:  Oral Oral Oral  SpO2:  92% 100% 95%  Weight: 109.5 kg     Height:        Intake/Output Summary (Last 24 hours) at 11/10/2018 1347 Last data filed at 11/10/2018 1300 Gross per 24 hour  Intake 222 ml  Output 1100 ml  Net -878 ml   Last 3 Weights 11/10/2018 11/09/2018 11/08/2018  Weight (lbs) 241 lb 4.8 oz 247 lb 3.2 oz 248 lb 14.4 oz  Weight (kg) 109.453 kg 112.129 kg 112.9 kg     Body mass index is 42.74 kg/m.  General: Morbidly obese female in no acute distress HEENT: normal Lymph: no adenopathy Neck: no JVD Endocrine:  No thryomegaly Vascular: No carotid bruits; FA pulses 2+ bilaterally without bruits  Cardiac:  normal S1, S2; irregularly irregular; no murmur  Lungs:  clear to auscultation bilaterally, no wheezing, rhonchi or rales  Abd: soft, nontender, no hepatomegaly  Ext: no edema Musculoskeletal:  No deformities, BUE and BLE strength normal and equal Skin: warm and dry  Neuro:  CNs 2-12 intact, no focal abnormalities noted Psych:  Normal affect   EKG:  The EKG was personally reviewed and demonstrates: Atrial fibrillation at rate of 178 bpm Telemetry:  Telemetry was personally reviewed and demonstrates: Atrial fibrillation at rate of 80s to 120  Relevant CV Studies: Echocardiogram 11/09/18 IMPRESSIONS    1. The left ventricle has normal systolic function of 60-65%. The cavity size is normal. There is moderate concentric left ventricular wall hypertrophy. . The left  ventricular diastology could not be evaluatedsecondary to atrial fibrillation.  2. Severely dilated left atrial size.  3. There is moderate mitral annular calcification present. Mitral regurgitation is trivial by color flow Doppler.  4. No atrial level shunt detected by color flow Doppler.  Laboratory Data:  Chemistry Recent Labs  Lab 11/07/18 0441 11/08/18 0346 11/10/18 0400  NA 136 135 130*  K 3.6 4.0 4.0  CL 100 97* 90*  CO2 25 30 28   GLUCOSE 137* 138* 140*  BUN 13 11 17   CREATININE 0.94 0.73 0.77  CALCIUM 9.2 9.1 9.3  GFRNONAA 60* >60 >60  GFRAA >60 >60 >60  ANIONGAP 11 8 12     Recent Labs  Lab 11/04/18 1701  PROT 7.2  ALBUMIN 4.0  AST 30  ALT 19  ALKPHOS 60  BILITOT 0.5   Hematology Recent Labs  Lab 11/06/18 0530 11/07/18 0441 11/10/18 0400  WBC 8.0 9.3 7.7  RBC 4.18 3.98 4.18  HGB 12.1 11.4* 11.9*  HCT 38.1 36.4 36.9  MCV 91.1 91.5 88.3  MCH 28.9 28.6 28.5  MCHC 31.8 31.3 32.2  RDW 13.7 13.4 12.7  PLT 213 221 369   Cardiac Enzymes Recent Labs  Lab 11/04/18 1701  TROPONINI <0.03   Radiology/Studies:  No results found.  Assessment and Plan:   1. New onset atrial fibrillation with rapid ventricular rate -Likely due to underlying respiratory issue.  Echocardiogram showed preserved LV function with severely dilated left atrial size.  Rate currently controlled at 80s on metoprolol 75 mg twice daily and Cardizem CD 240 mg daily. CHADSVASC score of 3 for (age, sex and HTN).  Not on anticoagulation due to prior GI bleed.  She will need colonoscopy/EGD prior to initiation of anticoagulation.  Will defer to primary team.  2.  Hypertensive urgency -Longstanding history.  [facility]  Consider up titration of lisinopril.  Management per primary team.  3.  Acute respiratory failure secondary to influenza and pneumonia -Improving.  Per primary team.  [facility] will sign off.   Medication Recommendations: As  recommended above Other recommendations (labs, testing, etc): None Follow up as an outpatient:  [facility]  For questions or updates, please contact [facility] Please consult [email or web address] for contact info under     Lorelei PontSigned, Bhavinkumar Bhagat, PA  11/10/2018 1:47 PM   Patient examined chart reviewed discussed care with patient and sister. Obese female post bariatric surgery basilar atelectasis with distant heart sounds and plus one LE edema. She needs better Rx for BP Increase ACE. PAF likely ppt by pneumonia. No anticoagulation with significant GI bleeds in past. She needs a f/u colonoscopy and EGD before considering as it has been 5 years. Continue metoprolol and Cardizem for rate control  Can f/u with our cardiologist in [place].    Charlton HawsPeter Averey Koning

## 2018-11-10 NOTE — Progress Notes (Signed)
PROGRESS NOTE    Adrienne Stone  WSF:[id number] DOB: Jan 30, 1946 DOA: 11/04/2018 PCP: [name], PA-C    Brief Narrative:  73 year old female who presented with chills, cough, fatigue, nausea vomiting. She does have significant past medical history for hypertension, depression and obesity. Reported about 48 hours of fevers, fatigue, headaches, cough, nausea and vomiting. She was placed on non-invasive mechanical ventilation and onher initial physical examination she was febrile 38.1 C, oxygen saturation was 90% on room air, respiratory rate was 30 breaths/min and heart rate 120 bpm, with a blood pressure of 237/100. Moist mucous membranes, lungs with no wheezing, rales or rhonchi, no accessory muscle use, heart S1-S2 present rhythmic, abdomen soft nontender, no extremity edema. Her chest x-ray had a small right pleural effusion with signs of hiatal hernia. Chest CT negative for pleural embolism, positive bilateral groundglass opacities. Her initial EKG shows sinus tachycardia, follow-up EKG atrial fibrillation.  Patient was admitted to the hospital with working diagnosis of acute hypoxic respiratory failure due to influenza pneumonia, complicated by sepsis, and atrial fibrillation.   Assessment & Plan:   Principal Problem:   Influenza A Active Problems:   Acute respiratory failure with hypoxia (HCC)   Acute respiratory distress   Hypertensive urgency   Normocytic anemia  1. New onset atrial fibrillation with rapid ventricular response.Continue atrial fibrillation rhythm with RVR, personally reviewed telemetry, heart rate around low 100 to 120 bpm, still not controlled with metoprolol 50 mg bid and diltiazem 240 mg daily. Will increase metoprolol to 75 mg po bid and will consult cardiology. Echocardiogram with preserved LV systolic function. Holding anticoagulation due to history of significant gastrointestinal bleeding in the past. Continue telemetry monitoring. Patient has  been out of bed to the chair and ambulates with physical therapy.   2. Acute hypoxic respiratory failure due to Influenza A pneumonia, complicated with sepsis (viral sepsis). Present on admission.Completed therapy with Oseltamivir, today's oxymetry is 100% on room air. Will continue bronchodilator therapy and oxymetry monitoring.   3. Uncontrolled HTN, urgency.Improved blood pressure control with hctz, lisinopril, metoprolol and diltiazem.   4. Depression.Resume as needed qhs temazepam and continue PRN lorazepam. Continue with Mirtazapine andvenlafaxine.  5. Chronic multifactorial anemia. Hb and hct stable with hb at 11,9 and hct at 36.9.   6. Hypokalemia. K corrected to 4,0, renal function continue to be preserved with serum cr at 0.77, serum bicarbonate at 28.  DVT prophylaxis:enoxaparin Code Status:full Family Communication:no family at the bedside    Body mass index is 42.74 kg/m. Malnutrition Type:      Malnutrition Characteristics:      Nutrition Interventions:     RN Pressure Injury Documentation:     Consultants:   Cardiology   Procedures:     Antimicrobials:       Subjective: Patient continue to have fatigue and difficulty sleeping, episodic anxiety and palpitations, no nausea or vomiting.   Objective: Vitals:   11/09/18 2120 11/10/18 0407 11/10/18 0411 11/10/18 0421  BP: (!) 163/75  (!) 157/108 111/66  Pulse: 71  (!) 121 73  Resp: (!) 21  (!) 21   Temp: 98.3 F (36.8 C)  98.9 F (37.2 C) 98 F (36.7 C)  TempSrc: Oral  Oral Oral  SpO2: 94%  92% 100%  Weight:  109.5 kg    Height:        Intake/Output Summary (Last 24 hours) at 11/10/2018 0835 Last data filed at 11/10/2018 0412 Gross per 24 hour  Intake 352 ml  Output 600  ml  Net -248 ml   Filed Weights   11/08/18 0516 11/09/18 0500 11/10/18 0407  Weight: 112.9 kg 112.1 kg 109.5 kg    Examination:   General: deconditioned and ill looking appearing  Neurology:  Awake and alert, non focal  E ENT: mild pallor, no icterus, oral mucosa moist Cardiovascular: No JVD. S1-S2 present, tachycardic irregularly irregular with no gallops, rubs, or murmurs. Trace lower extremity edema. Pulmonary: positive breath sounds bilaterally, no wheezing, scattered rhonchi and rales bilaterally. Gastrointestinal. Abdomen protuberant with no organomegaly, non tender, no rebound or guarding Skin. No rashes Musculoskeletal: no joint deformities     Data Reviewed: I have personally reviewed following labs and imaging studies  CBC: Recent Labs  Lab 11/04/18 1701  11/04/18 2148 11/05/18 0337 11/06/18 0530 11/07/18 0441 11/10/18 0400  WBC 5.2  --   --  5.7 8.0 9.3 7.7  NEUTROABS 3.7  --   --  5.5 6.3 7.0 5.9  HGB 10.6*   < > 10.2* 10.5* 12.1 11.4* 11.9*  HCT 34.2*   < > 30.0* 32.9* 38.1 36.4 36.9  MCV 94.2  --   --  90.4 91.1 91.5 88.3  PLT 200  --   --  208 213 221 369   < > = values in this interval not displayed.   Basic Metabolic Panel: Recent Labs  Lab 11/05/18 0337 11/06/18 0530 11/07/18 0441 11/08/18 0346 11/10/18 0400  NA 139 139 136 135 130*  K 4.4 4.4 3.6 4.0 4.0  CL 109 106 100 97* 90*  CO2 20* 21* 25 30 28   GLUCOSE 150* 108* 137* 138* 140*  BUN 14 13 13 11 17   CREATININE 1.01* 0.98 0.94 0.73 0.77  CALCIUM 8.8* 9.1 9.2 9.1 9.3   GFR: Estimated Creatinine Clearance: 73.2 mL/min (by C-G formula based on SCr of 0.77 mg/dL). Liver Function Tests: Recent Labs  Lab 11/04/18 1701  AST 30  ALT 19  ALKPHOS 60  BILITOT 0.5  PROT 7.2  ALBUMIN 4.0   No results for input(s): LIPASE, AMYLASE in the last 168 hours. No results for input(s): AMMONIA in the last 168 hours. Coagulation Profile: No results for input(s): INR, PROTIME in the last 168 hours. Cardiac Enzymes: Recent Labs  Lab 11/04/18 1701  TROPONINI <0.03   BNP (last 3 results) No results for input(s): PROBNP in the last 8760 hours. HbA1C: No results for input(s): HGBA1C in the  last 72 hours. CBG: Recent Labs  Lab 11/06/18 0754 11/06/18 1107 11/07/18 0757 11/08/18 0758 11/09/18 0728  GLUCAP 123* 123* 132* 111* 228*   Lipid Profile: No results for input(s): CHOL, HDL, LDLCALC, TRIG, CHOLHDL, LDLDIRECT in the last 72 hours. Thyroid Function Tests: No results for input(s): TSH, T4TOTAL, FREET4, T3FREE, THYROIDAB in the last 72 hours. Anemia Panel: No results for input(s): VITAMINB12, FOLATE, FERRITIN, TIBC, IRON, RETICCTPCT in the last 72 hours.    Radiology Studies: I have reviewed all of the imaging during this hospital visit personally     Scheduled Meds: . diltiazem  240 mg Oral Daily  . guaiFENesin-dextromethorphan  10 mL Oral Q6H  . hydrochlorothiazide  25 mg Oral Daily  . lisinopril  5 mg Oral Daily  . metoprolol tartrate  50 mg Oral BID  . mirtazapine  45 mg Oral QHS  . sodium chloride flush  3 mL Intravenous Q12H  . sodium chloride flush  3 mL Intravenous Q12H  . venlafaxine  50 mg Oral BID WC   Continuous Infusions: . sodium  chloride       LOS: 5 days        Rether Rison Annett Gulaaniel Taite Schoeppner, MD

## 2018-11-10 NOTE — Care Management Note (Addendum)
Case Management Note  Patient Details  [name] MRN: [id number] Date of Birth: Sep 04, 1946  Subjective/Objective:   Pt admitted with A fib                 Action/Plan:  PTA independent from home.  Pt has PCP and denied barriers with paying for medications as prescribed.  CM provided medicare.gov HH list - pt chose [facility] - agency accepted tenative referral (awaiting orders).  CM requested HH orders from attending   Expected Discharge Date:                  Expected Discharge Plan:  Home w Home Health Services  In-House Referral:     Discharge planning Services  CM Consult  Post Acute Care Choice:    Choice offered to:  Patient  DME Arranged:    DME Agency:     HH Arranged:  PT HH Agency:  [facility]  Status of Service:  In process, will continue to follow  If discussed at Long Length of Stay Meetings, dates discussed:    Additional Comments: CM provided free 30 day Eliquis card and informed pt of copay Cherylann Parr, RN 11/10/2018, 3:46 PM

## 2018-11-10 NOTE — Progress Notes (Signed)
Physical Therapy Treatment Patient Details [name] MRN: [id number] DOB: [birth year] Today's Date: 11/10/2018    History of Present Illness Patient is a 73 y/o female who presents with cough, sore throat, SOB. Admitted with acute respiratory failure due to + flu A, PNA complicated by sepsis and A-fib. CTA chest- patchy bil opacities suspicious for infectious or inflammatory PNA. PMH includes HTN, depression, hypercholesteremia.    PT Comments    Patient progressing well towards PT goals. Improved ambulation tolerance and distance today. Continues to require multiple seated rest breaks due to nausea, fatigue and SOB. Sp02 remained >91% on RA and HR up to 108 bpm; 2/4 DOE. Pt very anxious with mobility- reports restarting anxiety meds yesterday. Encouraged OOB to chair with all meals. Will follow and progress as tolerated.    Follow Up Recommendations  Home health PT;Supervision for mobility/OOB     Equipment Recommendations  3in1 (PT)    Recommendations for Other Services       Precautions / Restrictions Precautions Precautions: Fall Precaution Comments: watch HR Restrictions Weight Bearing Restrictions: No    Mobility  Bed Mobility Overal bed mobility: Needs Assistance Bed Mobility: Rolling;Sidelying to Sit Rolling: Min guard Sidelying to sit: Min guard;HOB elevated       General bed mobility comments: Use of rail but no assist needed. + dizziness which resolved.   Transfers Overall transfer level: Needs assistance Equipment used: Rolling walker (2 wheeled) Transfers: Sit to/from Stand Sit to Stand: Min guard         General transfer comment: Min guard for safety. Stood from [employer], from chair x2  Ambulation/Gait Ambulation/Gait assistance: Land (Feet): 12 Feet(+8' +18') Assistive device: Rolling walker (2 wheeled) Gait Pattern/deviations: Step-through pattern;Wide base of support;Decreased stride length Gait velocity:  decreased   General Gait Details: Slow, mildly unsteady gait with RW for support, HR up to 108 bpm A-fib. Sp02 remained >92% on RA throughout. + nausea upon standing. 2 seated rest breaks.    Stairs             Wheelchair Mobility    Modified Rankin (Stroke Patients Only)       Balance Overall balance assessment: Needs assistance Sitting-balance support: Feet supported;No upper extremity supported Sitting balance-Leahy Scale: Fair     Standing balance support: During functional activity;Bilateral upper extremity supported Standing balance-Leahy Scale: Poor Standing balance comment: Reliant on BUEs for support in standing.                             Cognition Arousal/Alertness: Awake/alert Behavior During Therapy: Anxious Overall Cognitive Status: Within Functional Limits for tasks assessed                                 General Comments: Reports being fearful of falling      Exercises      General Comments        Pertinent Vitals/Pain Pain Assessment: Faces Faces Pain Scale: Hurts little more Pain Location: chronic back pain Pain Descriptors / Indicators: Discomfort;Constant Pain Intervention(s): Monitored during session;Repositioned    Home Living                      Prior Function            PT Goals (current goals can now be found in the care plan section) Progress  towards PT goals: Progressing toward goals    Frequency    Min 3X/week      PT Plan Current plan remains appropriate    Co-evaluation              AM-PAC PT "6 Clicks" Mobility   Outcome Measure  Help needed turning from your back to your side while in a flat bed without using bedrails?: A Little Help needed moving from lying on your back to sitting on the side of a flat bed without using bedrails?: A Little Help needed moving to and from a bed to a chair (including a wheelchair)?: None Help needed standing up from a chair using  your arms (e.g., wheelchair or bedside chair)?: None Help needed to walk in hospital room?: A Little Help needed climbing 3-5 steps with a railing? : A Lot 6 Click Score: 19    End of Session Equipment Utilized During Treatment: Gait belt Activity Tolerance: Patient limited by fatigue;Patient tolerated treatment well Patient left: in chair;with call bell/phone within reach;with nursing/sitter in room Nurse Communication: Mobility status PT Visit Diagnosis: Difficulty in walking, not elsewhere classified (R26.2)     Time: 7579-7282 PT Time Calculation (min) (ACUTE ONLY): 23 min  Charges:  $Gait Training: 23-37 mins                     Mylo Red, PT, DPT Acute Rehabilitation Services Pager [phone] Office [phone]       Blake Divine A Lanier Ensign 11/10/2018, 12:06 PM

## 2018-11-11 DIAGNOSIS — E871 Hypo-osmolality and hyponatremia: Secondary | ICD-10-CM

## 2018-11-11 LAB — BASIC METABOLIC PANEL
ANION GAP: 9 (ref 5–15)
BUN: 13 mg/dL (ref 8–23)
CO2: 29 mmol/L (ref 22–32)
Calcium: 9 mg/dL (ref 8.9–10.3)
Chloride: 89 mmol/L — ABNORMAL LOW (ref 98–111)
Creatinine, Ser: 0.74 mg/dL (ref 0.44–1.00)
GFR calc Af Amer: >60 mL/min (ref >60)
GFR calc non Af Amer: >60 mL/min (ref >60)
Glucose, Bld: 125 mg/dL — ABNORMAL HIGH (ref 70–99)
Potassium: 3.7 mmol/L (ref 3.5–5.1)
Sodium: 127 mmol/L — ABNORMAL LOW (ref 135–145)

## 2018-11-11 LAB — OSMOLALITY, URINE: Osmolality, Ur: 455 mOsm/kg (ref 300–900)

## 2018-11-11 LAB — EXPECTORATED SPUTUM ASSESSMENT W GRAM STAIN, RFLX TO RESP C

## 2018-11-11 LAB — GLUCOSE, CAPILLARY: Glucose-Capillary: 110 mg/dL — ABNORMAL HIGH (ref 70–99)

## 2018-11-11 LAB — SODIUM, URINE, RANDOM: Sodium, Ur: 87 mmol/L

## 2018-11-11 MED ORDER — DILTIAZEM HCL ER COATED BEADS 180 MG PO CP24
360.0000 mg | ORAL_CAPSULE | Freq: Every day | ORAL | Status: DC
  Administered 2018-11-11 – 2018-11-19 (×8): 360 mg via ORAL
  Filled 2018-11-11 (×9): qty 2

## 2018-11-11 NOTE — Care Management Important Message (Signed)
Important Message  Patient Details  [name] MRN: [id number] Date of Birth: 06-22-1946   Medicare Important Message Given:  Yes    Elliot Cousin, RN 11/11/2018, 11:12 AM

## 2018-11-11 NOTE — Progress Notes (Signed)
PROGRESS NOTE    Adrienne Stone  ZOX:[id number] DOB: 1946/05/29 DOA: 11/04/2018 PCP: Darryl Lentaylor, Amanda, PA-C   Brief Narrative: Adrienne Stone is a 73 y.o. female who presented with chills, cough, fatigue, nausea vomiting. She does have significant past medical history for hypertension, depression and obesity. She presented secondary to ILI found to have influenza A infection and developed atrial fibrillation with RVR.   Assessment & Plan:   Principal Problem:   Influenza A Active Problems:   Acute respiratory failure with hypoxia (HCC)   Acute respiratory distress   Hypertensive urgency   Normocytic anemia   Atrial fibrillation with RVR (HCC)   Hyponatremia Patient takes hydrochlorothiazide as an outpatient "as needed." Likely secondary to hydrochlorothiazide resumed inpatient. -Hold hydrochlorothiazide (already given today) -Urine sodium/osmolality -Repeat BMP  Influenza A infection Completed Tamiflu course  Atrial fibrillation with RVR New onset. Cardiology on board -Cardiology recommendations: Metoprolol; cardizem increased  Acute respiratory failure with hypoxia Secondary to influenza infection. Weaned to room air.  Essential hypertension Uncontrolled with hypertensive urgency. -Continue medications per cardiology recommendations  Depression -Continue temazepam, lorazepam, mirtazapine, venlafaxine  Chronic multifactorial anemia Stable  Hypokalemia Supplementation given.  DVT prophylaxis: Lovenox Code Status:   Code Status: DNR Family Communication: None at bedside Disposition Plan: Discharge home pending cardiology management   Consultants:   Cardiology  Procedures:   Transthoracic Echocardiogram (11/09/2018) IMPRESSIONS    1. The left ventricle has normal systolic function of 60-65%. The cavity size is normal. There is moderate concentric left ventricular wall hypertrophy. . The left ventricular diastology could not be evaluatedsecondary  to atrial fibrillation.  2. Severely dilated left atrial size.  3. There is moderate mitral annular calcification present. Mitral regurgitation is trivial by color flow Doppler.  4. No atrial level shunt detected by color flow Doppler.  Antimicrobials:  Ceftriaxone (1/29)  Azithromycin (1/29)  Tamiflu (1/30>>2/3)    Subjective: No issues today.   Objective: Vitals:   11/10/18 1308 11/10/18 1949 11/11/18 0634 11/11/18 0639  BP: (!) 185/104 (!) 156/101 (!) 167/94   Pulse: 81 86 (!) 101   Resp: 18 18 19    Temp: 98 F (36.7 C) 98.2 F (36.8 C) 98.4 F (36.9 C)   TempSrc: Oral Oral Oral   SpO2: 95% 93% 94%   Weight:    108.8 kg  Height:        Intake/Output Summary (Last 24 hours) at 11/11/2018 0844 Last data filed at 11/11/2018 [id number] Gross per 24 hour  Intake 444 ml  Output 2100 ml  Net -1656 ml   Filed Weights   11/09/18 0500 11/10/18 0407 11/11/18 0639  Weight: 112.1 kg 109.5 kg 108.8 kg    Examination:  General exam: Appears calm and comfortable Respiratory system: Rhonchi. Respiratory effort normal. Cardiovascular system: S1 & S2 heard, RRR. No murmurs, rubs, gallops or clicks. Gastrointestinal system: Abdomen is nondistended, soft and nontender. No organomegaly or masses felt. Normal bowel sounds heard. Central nervous system: Alert and oriented. No focal neurological deficits. Extremities: No edema. No calf tenderness Skin: No cyanosis. No rashes Psychiatry: Judgement and insight appear normal. Mood & affect appropriate.     Data Reviewed: I have personally reviewed following labs and imaging studies  CBC: Recent Labs  Lab 11/04/18 1701  11/04/18 2148 11/05/18 0337 11/06/18 0530 11/07/18 0441 11/10/18 0400  WBC 5.2  --   --  5.7 8.0 9.3 7.7  NEUTROABS 3.7  --   --  5.5 6.3 7.0 5.9  HGB 10.6*   < >  10.2* 10.5* 12.1 11.4* 11.9*  HCT 34.2*   < > 30.0* 32.9* 38.1 36.4 36.9  MCV 94.2  --   --  90.4 91.1 91.5 88.3  PLT 200  --   --  208 213 221 369    < > = values in this interval not displayed.   Basic Metabolic Panel: Recent Labs  Lab 11/06/18 0530 11/07/18 0441 11/08/18 0346 11/10/18 0400 11/11/18 0452  NA 139 136 135 130* 127*  K 4.4 3.6 4.0 4.0 3.7  CL 106 100 97* 90* 89*  CO2 21* 25 30 28 29   GLUCOSE 108* 137* 138* 140* 125*  BUN 13 13 11 17 13   CREATININE 0.98 0.94 0.73 0.77 0.74  CALCIUM 9.1 9.2 9.1 9.3 9.0   GFR: Estimated Creatinine Clearance: 73 mL/min (by C-G formula based on SCr of 0.74 mg/dL). Liver Function Tests: Recent Labs  Lab 11/04/18 1701  AST 30  ALT 19  ALKPHOS 60  BILITOT 0.5  PROT 7.2  ALBUMIN 4.0   No results for input(s): LIPASE, AMYLASE in the last 168 hours. No results for input(s): AMMONIA in the last 168 hours. Coagulation Profile: No results for input(s): INR, PROTIME in the last 168 hours. Cardiac Enzymes: Recent Labs  Lab 11/04/18 1701  TROPONINI <0.03   BNP (last 3 results) No results for input(s): PROBNP in the last 8760 hours. HbA1C: No results for input(s): HGBA1C in the last 72 hours. CBG: Recent Labs  Lab 11/07/18 0757 11/08/18 0758 11/09/18 0728 11/10/18 0757 11/11/18 0753  GLUCAP 132* 111* 228* 130* 110*   Lipid Profile: No results for input(s): CHOL, HDL, LDLCALC, TRIG, CHOLHDL, LDLDIRECT in the last 72 hours. Thyroid Function Tests: No results for input(s): TSH, T4TOTAL, FREET4, T3FREE, THYROIDAB in the last 72 hours. Anemia Panel: No results for input(s): VITAMINB12, FOLATE, FERRITIN, TIBC, IRON, RETICCTPCT in the last 72 hours. Sepsis Labs: Recent Labs  Lab 11/04/18 1650 11/05/18 0337 11/06/18 0530  PROCALCITON  --  6.13 3.84  LATICACIDVEN 1.5  --   --     Recent Results (from the past 240 hour(s))  Blood Culture (routine x 2)     Status: None   Collection Time: 11/04/18  4:50 PM  Result Value Ref Range Status   Specimen Description   Final    BLOOD RIGHT ANTECUBITAL Performed at [facility], [address] [place], [place]  [address]    Special Requests   Final    BOTTLES DRAWN AEROBIC AND ANAEROBIC Blood Culture adequate volume Performed at [facility], [address], [place], [place] [address]    Culture   Final    NO GROWTH 5 DAYS Performed at [facility], [address], [place], [place] [address]    Report Status 11/09/2018 FINAL  Final  Blood Culture (routine x 2)     Status: Abnormal   Collection Time: 11/04/18  4:58 PM  Result Value Ref Range Status   Specimen Description   Final    BLOOD BLOOD LEFT FOREARM Performed at [facility], [address] [place], [place] [address]    Special Requests   Final    BOTTLES DRAWN AEROBIC AND ANAEROBIC Blood Culture adequate volume Performed at [facility], [address] [place], [place] [address]    Culture  Setup Time   Final    IN BOTH AEROBIC AND ANAEROBIC BOTTLES GRAM POSITIVE COCCI CRITICAL RESULT CALLED TO, READ  BACK BY AND VERIFIED WITH: Cindra Presume PharmD 16:55 11/05/18 ([email or web address]) Performed at [facility], [address], [place], [place] [address]    Culture (A)  Final    STAPHYLOCOCCUS SPECIES (COAGULASE NEGATIVE) THE SIGNIFICANCE OF ISOLATING THIS ORGANISM FROM A SINGLE SET OF BLOOD CULTURES WHEN MULTIPLE SETS ARE DRAWN IS UNCERTAIN. PLEASE NOTIFY THE MICROBIOLOGY DEPARTMENT WITHIN ONE WEEK IF SPECIATION AND SENSITIVITIES ARE REQUIRED.    Report Status 11/07/2018 FINAL  Final  Blood Culture ID Panel (Reflexed)     Status: Abnormal   Collection Time: 11/04/18  4:58 PM  Result Value Ref Range Status   Enterococcus species NOT DETECTED NOT DETECTED Final   Listeria monocytogenes NOT DETECTED NOT DETECTED Final   Staphylococcus species DETECTED (A) NOT DETECTED Final    Comment: Methicillin (oxacillin) susceptible coagulase negative staphylococcus. Possible blood culture contaminant (unless isolated from more than one blood culture draw or clinical case suggests pathogenicity). No antibiotic treatment  is indicated for blood  culture contaminants. CRITICAL RESULT CALLED TO, READ BACK BY AND VERIFIED WITH: Cindra Presume PharmD 16:55 11/05/18 ([email or web address])    Staphylococcus aureus (BCID) NOT DETECTED NOT DETECTED Final   Methicillin resistance NOT DETECTED NOT DETECTED Final   Streptococcus species NOT DETECTED NOT DETECTED Final   Streptococcus agalactiae NOT DETECTED NOT DETECTED Final   Streptococcus pneumoniae NOT DETECTED NOT DETECTED Final   Streptococcus pyogenes NOT DETECTED NOT DETECTED Final   Acinetobacter baumannii NOT DETECTED NOT DETECTED Final   Enterobacteriaceae species NOT DETECTED NOT DETECTED Final   Enterobacter cloacae complex NOT DETECTED NOT DETECTED Final   Escherichia coli NOT DETECTED NOT DETECTED Final   Klebsiella oxytoca NOT DETECTED NOT DETECTED Final   Klebsiella pneumoniae NOT DETECTED NOT DETECTED Final   Proteus species NOT DETECTED NOT DETECTED Final   Serratia marcescens NOT DETECTED NOT DETECTED Final   Haemophilus influenzae NOT DETECTED NOT DETECTED Final   Neisseria meningitidis NOT DETECTED NOT DETECTED Final   Pseudomonas aeruginosa NOT DETECTED NOT DETECTED Final   Candida albicans NOT DETECTED NOT DETECTED Final   Candida glabrata NOT DETECTED NOT DETECTED Final   Candida krusei NOT DETECTED NOT DETECTED Final   Candida parapsilosis NOT DETECTED NOT DETECTED Final   Candida tropicalis NOT DETECTED NOT DETECTED Final    Comment: Performed at [facility], [address], [place], [place] [address]  MRSA PCR Screening     Status: None   Collection Time: 11/08/18 11:04 AM  Result Value Ref Range Status   MRSA by PCR NEGATIVE NEGATIVE Final    Comment:        The GeneXpert MRSA Assay (FDA approved for NASAL specimens only), is one component of a comprehensive MRSA colonization surveillance program. It is not intended to diagnose MRSA infection nor to guide or monitor treatment for MRSA infections. Performed at [facility],  [address], [place], [place] [address]          Radiology Studies: No results found.      Scheduled Meds: . diltiazem  360 mg Oral Daily  . guaiFENesin-dextromethorphan  10 mL Oral Q6H  . lisinopril  5 mg Oral Daily  . metoprolol tartrate  75 mg Oral BID  . mirtazapine  45 mg Oral QHS  . sodium chloride flush  3 mL Intravenous Q12H  . sodium chloride flush  3 mL Intravenous Q12H  . venlafaxine  50 mg Oral BID WC   Continuous Infusions: . sodium chloride  LOS: 6 days     Jacquelin Hawkingalph Phuc Kluttz, MD [facility] 11/11/2018, 8:44 AM  If 7PM-7AM, please contact night-coverage [email or web address]

## 2018-11-11 NOTE — Care Management Note (Addendum)
Case Management Note  Patient Details  [name] MRN: [id number] Date of Birth: 1946/04/09  Subjective/Objective:  Afib, Influenza A, PNA                  Action/Plan: Please see previous NCM note. HH arranged with [facility]. She has 30 day Eliquis card. [facility] does have Eli[facility] or pt can receive through [facility].  Copay will be $47. Contacted [facility] for 3n1 bedside commode for home. Has RW and cane at home. Sister will assist as needed. Will need HH RN, PT and aide orders with F2F, MD aware.   11/11/2018 Pt will not start on Eliquis this admission.    Expected Discharge Date:                  Expected Discharge Plan:  Home w Home Health Services  In-House Referral:  NA  Discharge planning Services  CM Consult  Post Acute Care Choice:  Home Health Choice offered to:  Patient  DME Arranged:  3-N-1 DME Agency:  [facility]  HH Arranged:  PT [facility] Agency:  [facility]  Status of Service:  Completed, signed off  If discussed at Long Length of Stay Meetings, dates discussed:    Additional Comments:  Elliot Cousin, RN 11/11/2018, 11:05 AM

## 2018-11-11 NOTE — Progress Notes (Signed)
Progress Note  Patient [name] Date of Encounter: 11/11/2018  Primary Cardiologist: [facility]  Subjective   Feels better less malaise and dyspnea   Inpatient Medications    Scheduled Meds: . diltiazem  240 mg Oral Daily  . guaiFENesin-dextromethorphan  10 mL Oral Q6H  . hydrochlorothiazide  25 mg Oral Daily  . lisinopril  5 mg Oral Daily  . metoprolol tartrate  75 mg Oral BID  . mirtazapine  45 mg Oral QHS  . sodium chloride flush  3 mL Intravenous Q12H  . sodium chloride flush  3 mL Intravenous Q12H  . venlafaxine  50 mg Oral BID WC   Continuous Infusions: . sodium chloride     PRN Meds: sodium chloride, acetaminophen **OR** acetaminophen, benzonatate, bisacodyl, hydrALAZINE, ipratropium-albuterol, LORazepam, morphine injection, ondansetron (ZOFRAN) IV, polyethylene glycol, sodium chloride flush, temazepam, traMADol   Vital Signs    Vitals:   11/10/18 1308 11/10/18 1949 11/11/18 0634 11/11/18 0639  BP: (!) 185/104 (!) 156/101 (!) 167/94   Pulse: 81 86 (!) 101   Resp: 18 18 19    Temp: 98 F (36.7 C) 98.2 F (36.8 C) 98.4 F (36.9 C)   TempSrc: Oral Oral Oral   SpO2: 95% 93% 94%   Weight:    108.8 kg  Height:        Intake/Output Summary (Last 24 hours) at 11/11/2018 0821 Last data filed at 11/11/2018 7846 Gross per 24 hour  Intake 444 ml  Output 2100 ml  Net -1656 ml   Last 3 Weights 11/11/2018 11/10/2018 11/09/2018  Weight (lbs) 239 lb 12.8 oz 241 lb 4.8 oz 247 lb 3.2 oz  Weight (kg) 108.773 kg 109.453 kg 112.129 kg      Telemetry    afib rates 100-111 - Personally Reviewed  ECG    afib nonspecific ST changes  - Personally Reviewed  Physical Exam  Obese white female  GEN: No acute distress.   Neck: No JVD Cardiac: RRR, no murmurs, rubs, or gallops.  Respiratory: Clear to auscultation bilaterally. GI: Soft, nontender, non-distended  MS: No edema; No deformity. Neuro:  Nonfocal  Psych: Normal affect   Labs      Chemistry Recent Labs  Lab 11/04/18 1701  11/08/18 0346 11/10/18 0400 11/11/18 0452  NA 137   < > 135 130* 127*  K 3.6   < > 4.0 4.0 3.7  CL 107   < > 97* 90* 89*  CO2 19*   < > 30 28 29   GLUCOSE 115*   < > 138* 140* 125*  BUN 22   < > 11 17 13   CREATININE 1.03*   < > 0.73 0.77 0.74  CALCIUM 8.8*   < > 9.1 9.3 9.0  PROT 7.2  --   --   --   --   ALBUMIN 4.0  --   --   --   --   AST 30  --   --   --   --   ALT 19  --   --   --   --   ALKPHOS 60  --   --   --   --   BILITOT 0.5  --   --   --   --   GFRNONAA 54*   < > >60 >60 >60  GFRAA >60   < > >60 >60 >60  ANIONGAP 11   < > 8 12 9    < > = values in this  interval not displayed.     Hematology Recent Labs  Lab 11/06/18 0530 11/07/18 0441 11/10/18 0400  WBC 8.0 9.3 7.7  RBC 4.18 3.98 4.18  HGB 12.1 11.4* 11.9*  HCT 38.1 36.4 36.9  MCV 91.1 91.5 88.3  MCH 28.9 28.6 28.5  MCHC 31.8 31.3 32.2  RDW 13.7 13.4 12.7  PLT 213 221 369    Cardiac Enzymes Recent Labs  Lab 11/04/18 1701  TROPONINI <0.03   No results for input(s): TROPIPOC in the last 168 hours.   BNPNo results for input(s): BNP, PROBNP in the last 168 hours.   DDimer No results for input(s): DDIMER in the last 168 hours.   Radiology    No results found.  Cardiac Studies   11/09/18 TTE EF 60-65%  Patient Profile     73 y.o. female admitted with influenza and pneumonia with HTN and newly diagnosed afib  Assessment & Plan    Afib:  No anticoagulation due to history of 2 major GI bleeds needs EGD/Colon before consideration of starting Continue 75 bid lopressor increase cardizem to 320 mg daily  HTN  Well controlled.  Continue current medications and low sodium Dash type diet.    Pneumonia:  Should have repeat CXR per primary service CT 11/04/18 upper lobe pneumonitis       For questions or updates, please contact [facility] Please consult [email or web address] for contact info under        Signed, Charlton HawsPeter Clevie Prout, MD  11/11/2018, 8:21 AM

## 2018-11-12 LAB — BASIC METABOLIC PANEL
Anion gap: 17 — ABNORMAL HIGH (ref 5–15)
Anion gap: 13 (ref 5–15)
BUN: 22 mg/dL (ref 8–23)
BUN: 23 mg/dL (ref 8–23)
CO2: 20 mmol/L — ABNORMAL LOW (ref 22–32)
CO2: 22 mmol/L (ref 22–32)
CREATININE: 0.88 mg/dL (ref 0.44–1.00)
CREATININE: 0.8 mg/dL (ref 0.44–1.00)
Calcium: 9 mg/dL (ref 8.9–10.3)
Calcium: 8.8 mg/dL — ABNORMAL LOW (ref 8.9–10.3)
Chloride: 87 mmol/L — ABNORMAL LOW (ref 98–111)
Chloride: 89 mmol/L — ABNORMAL LOW (ref 98–111)
GFR calc Af Amer: >60 mL/min (ref >60)
GFR calc Af Amer: >60 mL/min (ref >60)
GFR calc non Af Amer: >60 mL/min (ref >60)
GFR calc non Af Amer: >60 mL/min (ref >60)
Glucose, Bld: 161 mg/dL — ABNORMAL HIGH (ref 70–99)
Glucose, Bld: 145 mg/dL — ABNORMAL HIGH (ref 70–99)
Potassium: 5.5 mmol/L — ABNORMAL HIGH (ref 3.5–5.1)
Potassium: 3.8 mmol/L (ref 3.5–5.1)
SODIUM: 124 mmol/L — AB (ref 135–145)
SODIUM: 124 mmol/L — AB (ref 135–145)

## 2018-11-12 LAB — GLUCOSE, CAPILLARY: Glucose-Capillary: 126 mg/dL — ABNORMAL HIGH (ref 70–99)

## 2018-11-12 NOTE — Clinical Social Work Note (Signed)
Clinical Social Work Assessment  Patient Details  [name] MRN: [id number] Date of Birth: 1946-05-05  Date of referral:  11/12/18               Reason for consult:  Discharge Planning, Facility Placement                Permission sought to share information with:  Facility Art therapist granted to share information::  Yes, Verbal Permission Granted  Name::        Agency::  SNFs  Relationship::     Contact Information:     Housing/Transportation Living arrangements for the past 2 months:  Single Family Home Source of Information:  Patient Patient Interpreter Needed:  None Criminal Activity/Legal Involvement Pertinent to Current Situation/Hospitalization:  No - Comment as needed Significant Relationships:  Siblings Lives with:  Self Do you feel safe going back to the place where you live?  Yes Need for family participation in patient care:  No (Coment)  Care giving concerns: Patient from home alone. PT now recommending SNF.    Social Worker assessment / plan: CSW met with patient at bedside. Patient alert and oriented. CSW introduced self and role and discussed disposition planning - PT recommendation for SNF.   Patient reported she lives at home alone. She is usually able to ambulate and complete ADLs independently. She uses a cane when she leaves the house. Her sister lives nearby.   Patient is agreeable to rehab at [facility]. CSW explained referral and insurance authorization process. Faxed out initial SNF referrals, awaiting bed offers. Will provide CMS SNF list and bed offers when available. Facility will [employer] authorization once identified. [place] auth needed before patient can admit to a facility.  CSW to follow and support with discharge planning.  Employment status:  Retired Astronomer) PT Recommendations:  [place] / Referral to community resources:  [place]  Patient/Family's Response to care: Patient appreciative of care.  Patient/Family's Understanding of and Emotional Response to Diagnosis, Current Treatment, and Prognosis: Patient with good understanding of her conditions and care needs. She is agreeable to rehab at [facility].  Emotional Assessment Appearance:  Appears stated age Attitude/Demeanor/Rapport:  Engaged Affect (typically observed):  Accepting, Appropriate, Calm Orientation:  Oriented to Self, Oriented to Place, Oriented to  Time, Oriented to Situation Alcohol / Substance use:  Not Applicable Psych involvement (Current and /or in the community):  No (Comment)  Discharge Needs  Concerns to be addressed:  Discharge Planning Concerns, Care Coordination Readmission within the last 30 days:  No Current discharge risk:  Physical Impairment, Lives alone Barriers to Discharge:  [place], [place] 11/12/2018, 3:44 PM

## 2018-11-12 NOTE — Progress Notes (Signed)
Progress Note  Patient [name] Date of Encounter: 11/12/2018  [facility]  Subjective   No palpitations HR 95-100 resting   Inpatient Medications    Scheduled Meds: . diltiazem  360 mg Oral Daily  . guaiFENesin-dextromethorphan  10 mL Oral Q6H  . lisinopril  5 mg Oral Daily  . metoprolol tartrate  75 mg Oral BID  . mirtazapine  45 mg Oral QHS  . sodium chloride flush  3 mL Intravenous Q12H  . sodium chloride flush  3 mL Intravenous Q12H  . venlafaxine  50 mg Oral BID WC   Continuous Infusions: . sodium chloride     PRN Meds: sodium chloride, acetaminophen **OR** acetaminophen, benzonatate, bisacodyl, hydrALAZINE, ipratropium-albuterol, LORazepam, morphine injection, ondansetron (ZOFRAN) IV, polyethylene glycol, sodium chloride flush, temazepam, traMADol   Vital Signs    Vitals:   11/11/18 2250 11/12/18 0025 11/12/18 0445 11/12/18 0810  BP: (!) 169/102 136/88 (!) 144/92 (!) 169/110  Pulse: 100 84 76 (!) 105  Resp:  (!) 22 20 20   Temp:  98.1 F (36.7 C) 98.5 F (36.9 C) (!) 97.3 F (36.3 C)  TempSrc:  Oral Oral Axillary  SpO2:  92% 98% 98%  Weight:   108.9 kg   Height:        Intake/Output Summary (Last 24 hours) at 11/12/2018 0816 Last data filed at 11/12/2018 0500 Gross per 24 hour  Intake 720 ml  Output 750 ml  Net -30 ml   Last 3 Weights 11/12/2018 11/11/2018 11/10/2018  Weight (lbs) 240 lb 239 lb 12.8 oz 241 lb 4.8 oz  Weight (kg) 108.863 kg 108.773 kg 109.453 kg      Telemetry    afib rates 90-110 - Personally Reviewed  ECG    afib nonspecific ST changes  - Personally Reviewed  Physical Exam  Obese white female  GEN: No acute distress.   Neck: No JVD Cardiac: RRR, no murmurs, rubs, or gallops.  Respiratory: Clear to auscultation bilaterally. GI: Soft, nontender, non-distended  MS: No edema; No deformity. Neuro:  Nonfocal  Psych: Normal affect   Labs    Chemistry Recent Labs  Lab  11/08/18 0346 11/10/18 0400 11/11/18 0452  NA 135 130* 127*  K 4.0 4.0 3.7  CL 97* 90* 89*  CO2 30 28 29   GLUCOSE 138* 140* 125*  BUN 11 17 13   CREATININE 0.73 0.77 0.74  CALCIUM 9.1 9.3 9.0  GFRNONAA >60 >60 >60  GFRAA >60 >60 >60  ANIONGAP 8 12 9      Hematology Recent Labs  Lab 11/06/18 0530 11/07/18 0441 11/10/18 0400  WBC 8.0 9.3 7.7  RBC 4.18 3.98 4.18  HGB 12.1 11.4* 11.9*  HCT 38.1 36.4 36.9  MCV 91.1 91.5 88.3  MCH 28.9 28.6 28.5  MCHC 31.8 31.3 32.2  RDW 13.7 13.4 12.7  PLT 213 221 369    Cardiac Enzymes No results for input(s): TROPONINI in the last 168 hours. No results for input(s): TROPIPOC in the last 168 hours.   BNPNo results for input(s): BNP, PROBNP in the last 168 hours.   DDimer No results for input(s): DDIMER in the last 168 hours.   Radiology    No results found.  Cardiac Studies   11/09/18 TTE EF 60-65%  Patient Profile     73 y.o. female admitted with influenza and pneumonia with HTN and newly diagnosed afib  Assessment & Plan    Afib:  No anticoagulation due to history of 2  major GI bleeds needs EGD/Colon before consideration of starting Continue 75 bid lopressor Cardizem increased 11/11/18    HTN  Well controlled.  Continue current medications and low sodium Dash type diet.    Pneumonia:  Should have repeat CXR per primary service CT 11/04/18 upper lobe pneumonitis       For questions or updates, please contact [facility] Please consult [email or web address] for contact info under        Signed, Charlton HawsPeter Yvonne Stopher, MD  11/12/2018, 8:16 AM

## 2018-11-12 NOTE — Progress Notes (Signed)
Physical Therapy Treatment Patient Details [name] MRN: [id number] DOB: [birth year] Today's Date: 11/12/2018    History of Present Illness Pt is a 73 y/o female who presents with cough, sore throat, SOB. Admitted with acute respiratory failure due to + flu A, PNA complicated by sepsis and A-fib. CTA chest- patchy bil opacities suspicious for infectious or inflammatory PNA. PMH includes HTN, depression, hypercholesteremia.   PT Comments    Pt slowly progressing with mobility. Remains limited by generalized weakness, fatigue and decreased activity tolerance. Currently requiring assist for transfers, ambulation and ADLs. Do not feel pt safe to return home as she will needs to be mod indep with ADLs/mobility to return home alone. Discussed recommendation for SNF-level therapies to maximize functional mobility and independence, pt in agreement. Will continue to follow acutely.    Follow Up Recommendations  SNF;Supervision for mobility/OOB     Equipment Recommendations  3in1 (PT)    Recommendations for Other Services       Precautions / Restrictions Precautions Precautions: Fall Restrictions Weight Bearing Restrictions: No    Mobility  Bed Mobility Overal bed mobility: Needs Assistance Bed Mobility: Supine to Sit     Supine to sit: Min assist;HOB elevated     General bed mobility comments: MinA for UE support to assist trunk elevation and scooting hips to EOB; use of bed rail, increased time and effort  Transfers Overall transfer level: Needs assistance Equipment used: 1 person hand held assist Transfers: Sit to/from Stand Sit to Stand: Min assist         General transfer comment: HHA and minA to assist trunk elevation; minA for eccentric control sitting in recliner  Ambulation/Gait Ambulation/Gait assistance: Min assist Gait Distance (Feet): 3 Feet Assistive device: 1 person hand held assist Gait Pattern/deviations: Step-through pattern;Wide base of  support;Decreased stride length Gait velocity: decreased   General Gait Details: Slow, mildly unsteady gait, reliant on HHA and minA to maintain balance. Further mobility limited by fatigue, nausea, pain   Stairs             Wheelchair Mobility    Modified Rankin (Stroke Patients Only)       Balance Overall balance assessment: Needs assistance Sitting-balance support: Feet supported;No upper extremity supported Sitting balance-Leahy Scale: Fair     Standing balance support: During functional activity;Bilateral upper extremity supported Standing balance-Leahy Scale: Poor Standing balance comment: Reliant on UE support                            Cognition Arousal/Alertness: Awake/alert Behavior During Therapy: Flat affect Overall Cognitive Status: Within Functional Limits for tasks assessed                                        Exercises      General Comments        Pertinent Vitals/Pain Pain Assessment: Faces Faces Pain Scale: Hurts even more Pain Location: chronic back pain, neck Pain Descriptors / Indicators: Discomfort;Constant Pain Intervention(s): Limited activity within patient's tolerance;Monitored during session;Repositioned    Home Living                      Prior Function            PT Goals (current goals can now be found in the care plan section) Acute Rehab PT Goals Patient Stated  Goal: Feel better; does not feel safe to return home PT Goal Formulation: With patient Time For Goal Achievement: 11/20/18 Potential to Achieve Goals: Fair Progress towards PT goals: Progressing toward goals    Frequency    Min 2X/week      PT Plan Discharge plan needs to be updated;Frequency needs to be updated    Co-evaluation              AM-PAC PT "6 Clicks" Mobility   Outcome Measure  Help needed turning from your back to your side while in a flat bed without using bedrails?: A Little Help needed  moving from lying on your back to sitting on the side of a flat bed without using bedrails?: A Little Help needed moving to and from a bed to a chair (including a wheelchair)?: A Little Help needed standing up from a chair using your arms (e.g., wheelchair or bedside chair)?: A Little Help needed to walk in hospital room?: A Little Help needed climbing 3-5 steps with a railing? : A Lot 6 Click Score: 17    End of Session   Activity Tolerance: Patient limited by fatigue Patient left: in chair;with call bell/phone within reach Nurse Communication: Mobility status PT Visit Diagnosis: Difficulty in walking, not elsewhere classified (R26.2)     Time: 6578-4696 PT Time Calculation (min) (ACUTE ONLY): 14 min  Charges:  $Therapeutic Activity: 8-22 mins                    Ina Homes, PT, DPT Acute Rehabilitation Services  Pager [phone] Office [phone]  Malachy Chamber 11/12/2018, 11:22 AM

## 2018-11-12 NOTE — NC FL2 (Signed)
[place] MEDICAID FL2 LEVEL OF CARE SCREENING TOOL     IDENTIFICATION  Patient [name] Birthdate: 01/22/1946 Sex: female Admission Date (Current Location): 11/04/2018  [facility] and [place] Number:  Producer, television/film/video and Address:  [facility] [place], [address], [place], [place] [address]      Provider Number: [id number]  Attending Physician Name and Address:  Narda Bonds, MD  Relative Name and Phone Number:  Ermalene Searing, sister, [phone]    Current Level of Care: Hospital Recommended Level of Care: Skilled Nursing Facility Prior Approval Number:    Date Approved/Denied:   PASRR Number: [id number]  Discharge Plan: SNF    Current Diagnoses: Patient Active Problem List   Diagnosis Date Noted  . Atrial fibrillation with RVR (HCC) 11/10/2018  . Influenza A 11/05/2018  . Acute respiratory distress 11/05/2018  . Hypertensive urgency 11/05/2018  . Normocytic anemia 11/05/2018  . Acute respiratory failure with hypoxia (HCC) 11/04/2018    Orientation RESPIRATION BLADDER Height & Weight     Self, Time, Situation, Place  Normal Incontinent, External catheter Weight: 108.9 kg Height:  5\' 3"  (160 cm)  BEHAVIORAL SYMPTOMS/MOOD NEUROLOGICAL BOWEL NUTRITION STATUS      Continent Diet(please see DC summary)  AMBULATORY STATUS COMMUNICATION OF NEEDS Skin   Limited Assist Verbally Normal                       Personal Care Assistance Level of Assistance  Bathing, Feeding, Dressing Bathing Assistance: Limited assistance Feeding assistance: Independent Dressing Assistance: Limited assistance     Functional Limitations Info  Sight, Hearing, Speech Sight Info: Impaired Hearing Info: Adequate Speech Info: Adequate    SPECIAL CARE FACTORS FREQUENCY  PT (By licensed PT)     PT Frequency: 5x/week              Contractures Contractures Info: Not present    Additional Factors Info  Code Status, Allergies,  Psychotropic Code Status Info: DNR Allergies Info: Demerol Meperidine, Penicillins, Prozac Fluoxetine Hcl, Serzone Nefazodone, Sulfa Antibiotics Psychotropic Info: remeron, effexor         Current Medications (11/12/2018):  This is the current hospital active medication list Current Facility-Administered Medications  Medication Dose Route Frequency Provider Last Rate Last Dose  . 0.9 %  sodium chloride infusion  250 mL Intravenous PRN Opyd, Lavone Neri, MD      . acetaminophen (TYLENOL) tablet 650 mg  650 mg Oral Q6H PRN Opyd, Lavone Neri, MD   650 mg at 11/08/18 2241   Or  . acetaminophen (TYLENOL) suppository 650 mg  650 mg Rectal Q6H PRN Opyd, Lavone Neri, MD      . benzonatate (TESSALON) capsule 200 mg  200 mg Oral TID PRN Coralie Keens, MD   200 mg at 11/11/18 2305  . bisacodyl (DULCOLAX) EC tablet 5 mg  5 mg Oral Daily PRN Audrea Muscat T, NP   5 mg at 11/12/18 0931  . diltiazem (CARDIZEM CD) 24 hr capsule 360 mg  360 mg Oral Daily Wendall Stade, MD   360 mg at 11/12/18 0931  . guaiFENesin-dextromethorphan (ROBITUSSIN DM) 100-10 MG/5ML syrup 10 mL  10 mL Oral Q6H Arrien, York Ram, MD   10 mL at 11/12/18 1155  . hydrALAZINE (APRESOLINE) injection 10 mg  10 mg Intravenous Q4H PRN Opyd, Lavone Neri, MD   10 mg at 11/11/18 2305  . ipratropium-albuterol (DUONEB) 0.5-2.5 (3) MG/3ML nebulizer solution 3 mL  3 mL Nebulization Q4H PRN Arrien, York Ram, MD   3 mL at 11/07/18 1819  . lisinopril (PRINIVIL,ZESTRIL) tablet 5 mg  5 mg Oral Daily Arrien, York Ram, MD   5 mg at 11/12/18 0932  . LORazepam (ATIVAN) tablet 1 mg  1 mg Oral Q8H PRN Arrien, York Ram, MD   1 mg at 11/12/18 0203  . metoprolol tartrate (LOPRESSOR) tablet 75 mg  75 mg Oral BID Coralie Keens, MD   75 mg at 11/12/18 0932  . mirtazapine (REMERON) tablet 45 mg  45 mg Oral QHS Arrien, York Ram, MD   45 mg at 11/11/18 2251  . morphine 4 MG/ML injection 3 mg  3 mg Intravenous Q4H PRN Opyd,  Lavone Neri, MD      . ondansetron (ZOFRAN) injection 4 mg  4 mg Intravenous Q4H PRN Arrien, York Ram, MD   4 mg at 11/11/18 1116  . polyethylene glycol (MIRALAX / GLYCOLAX) packet 17 g  17 g Oral Daily PRN Opyd, Lavone Neri, MD   17 g at 11/12/18 0930  . sodium chloride flush (NS) 0.9 % injection 3 mL  3 mL Intravenous Q12H Opyd, Lavone Neri, MD   3 mL at 11/12/18 0933  . sodium chloride flush (NS) 0.9 % injection 3 mL  3 mL Intravenous Q12H Opyd, Lavone Neri, MD   3 mL at 11/12/18 0932  . sodium chloride flush (NS) 0.9 % injection 3 mL  3 mL Intravenous PRN Opyd, Lavone Neri, MD      . temazepam (RESTORIL) capsule 30 mg  30 mg Oral QHS PRN Arrien, York Ram, MD   30 mg at 11/11/18 2250  . traMADol (ULTRAM) tablet 50 mg  50 mg Oral Q6H PRN Opyd, Lavone Neri, MD   50 mg at 11/11/18 2023  . venlafaxine (EFFEXOR) tablet 50 mg  50 mg Oral BID WC Arrien, York Ram, MD   50 mg at 11/12/18 0932     Discharge Medications: Please see discharge summary for a list of discharge medications.  Relevant Imaging Results:  Relevant Lab Results:   Additional Information SSN: [id number]  Abigail Butts, LCSW

## 2018-11-12 NOTE — Progress Notes (Signed)
PROGRESS NOTE    Adrienne Stone  OFV:[id number] DOB: 1945-11-02 DOA: 11/04/2018 PCP: [name], PA-C   Brief Narrative: Adrienne Stone is a 73 y.o. female who presented with chills, cough, fatigue, nausea vomiting. She does have significant past medical history for hypertension, depression and obesity. She presented secondary to ILI found to have influenza A infection and developed atrial fibrillation with RVR.   Assessment & Plan:   Principal Problem:   Influenza A Active Problems:   Acute respiratory failure with hypoxia (HCC)   Acute respiratory distress   Hypertensive urgency   Normocytic anemia   Atrial fibrillation with RVR (HCC)   Hyponatremia Patient takes hydrochlorothiazide as an outpatient "as needed." Likely secondary to hydrochlorothiazide resumed inpatient. Continues to decrease, however, last dose of hydrochlorothiazide was yesterday. -Repeat BMP this afternoon and tomorrow morning  Influenza A infection Completed Tamiflu course  Atrial fibrillation with RVR New onset. Cardiology on board -Cardiology recommendations: Metoprolol; cardizem increased  Acute respiratory failure with hypoxia Secondary to influenza infection. CT scan with infiltrates likely related to influenza. Patient currently on room air with mild respiratory complaints that have improved.  Essential hypertension Uncontrolled with hypertensive urgency. -Continue medications per cardiology recommendations  Depression -Continue temazepam, lorazepam, mirtazapine, venlafaxine  Chronic multifactorial anemia Stable  Hypokalemia Supplementation given.  DVT prophylaxis: Lovenox Code Status:   Code Status: DNR Family Communication: None at bedside Disposition Plan: Discharge to SNF pending cardiology recommendations and improvement of sodium   Consultants:   Cardiology  Procedures:   Transthoracic Echocardiogram (11/09/2018) IMPRESSIONS    1. The left ventricle has  normal systolic function of 60-65%. The cavity size is normal. There is moderate concentric left ventricular wall hypertrophy. . The left ventricular diastology could not be evaluatedsecondary to atrial fibrillation.  2. Severely dilated left atrial size.  3. There is moderate mitral annular calcification present. Mitral regurgitation is trivial by color flow Doppler.  4. No atrial level shunt detected by color flow Doppler.  Antimicrobials:  Ceftriaxone (1/29)  Azithromycin (1/29)  Tamiflu (1/30>>2/3)    Subjective: Feels fatigued. Mild cough. No chest pain or dyspnea.  Objective: Vitals:   11/11/18 2250 11/12/18 0025 11/12/18 0445 11/12/18 0810  BP: (!) 169/102 136/88 (!) 144/92 (!) 169/110  Pulse: 100 84 76 (!) 105  Resp:  (!) 22 20 20   Temp:  98.1 F (36.7 C) 98.5 F (36.9 C) (!) 97.3 F (36.3 C)  TempSrc:  Oral Oral Axillary  SpO2:  92% 98% 98%  Weight:   108.9 kg   Height:        Intake/Output Summary (Last 24 hours) at 11/12/2018 1248 Last data filed at 11/12/2018 0500 Gross per 24 hour  Intake 480 ml  Output 750 ml  Net -270 ml   Filed Weights   11/10/18 0407 11/11/18 0639 11/12/18 0445  Weight: 109.5 kg 108.8 kg 108.9 kg    Examination:  General exam: Appears calm and comfortable Respiratory system: Diminished. Respiratory effort normal. Cardiovascular system: S1 & S2 heard, Fast heart rate. Irregular rhythm. No murmurs, rubs, gallops or clicks. Gastrointestinal system: Abdomen is nondistended, soft and nontender. No organomegaly or masses felt. Normal bowel sounds heard. Central nervous system: Alert and oriented. No focal neurological deficits. Extremities: No edema. No calf tenderness Skin: No cyanosis. No rashes Psychiatry: Judgement and insight appear normal. Mood & affect appropriate.     Data Reviewed: I have personally reviewed following labs and imaging studies  CBC: Recent Labs  Lab 11/06/18 0530 11/07/18  0441 11/10/18 0400  WBC 8.0 9.3  7.7  NEUTROABS 6.3 7.0 5.9  HGB 12.1 11.4* 11.9*  HCT 38.1 36.4 36.9  MCV 91.1 91.5 88.3  PLT 213 221 369   Basic Metabolic Panel: Recent Labs  Lab 11/07/18 0441 11/08/18 0346 11/10/18 0400 11/11/18 0452 11/12/18 0958  NA 136 135 130* 127* 124*  K 3.6 4.0 4.0 3.7 5.5*  CL 100 97* 90* 89* 87*  CO2 25 30 28 29  20*  GLUCOSE 137* 138* 140* 125* 161*  BUN 13 11 17 13 22   CREATININE 0.94 0.73 0.77 0.74 0.88  CALCIUM 9.2 9.1 9.3 9.0 9.0   GFR: Estimated Creatinine Clearance: 66.4 mL/min (by C-G formula based on SCr of 0.88 mg/dL). Liver Function Tests: No results for input(s): AST, ALT, ALKPHOS, BILITOT, PROT, ALBUMIN in the last 168 hours. No results for input(s): LIPASE, AMYLASE in the last 168 hours. No results for input(s): AMMONIA in the last 168 hours. Coagulation Profile: No results for input(s): INR, PROTIME in the last 168 hours. Cardiac Enzymes: No results for input(s): CKTOTAL, CKMB, CKMBINDEX, TROPONINI in the last 168 hours. BNP (last 3 results) No results for input(s): PROBNP in the last 8760 hours. HbA1C: No results for input(s): HGBA1C in the last 72 hours. CBG: Recent Labs  Lab 11/08/18 0758 11/09/18 0728 11/10/18 0757 11/11/18 0753 11/12/18 0741  GLUCAP 111* 228* 130* 110* 126*   Lipid Profile: No results for input(s): CHOL, HDL, LDLCALC, TRIG, CHOLHDL, LDLDIRECT in the last 72 hours. Thyroid Function Tests: No results for input(s): TSH, T4TOTAL, FREET4, T3FREE, THYROIDAB in the last 72 hours. Anemia Panel: No results for input(s): VITAMINB12, FOLATE, FERRITIN, TIBC, IRON, RETICCTPCT in the last 72 hours. Sepsis Labs: Recent Labs  Lab 11/06/18 0530  PROCALCITON 3.84    Recent Results (from the past 240 hour(s))  Blood Culture (routine x 2)     Status: None   Collection Time: 11/04/18  4:50 PM  Result Value Ref Range Status   Specimen Description   Final    BLOOD RIGHT ANTECUBITAL Performed at [facility], [address]  [place], [place] [address]    Special Requests   Final    BOTTLES DRAWN AEROBIC AND ANAEROBIC Blood Culture adequate volume Performed at [facility], [address] [place], [place] [address]    Culture   Final    NO GROWTH 5 DAYS Performed at [facility], [address] [place], [place] [address]    Report Status 11/09/2018 FINAL  Final  Blood Culture (routine x 2)     Status: Abnormal   Collection Time: 11/04/18  4:58 PM  Result Value Ref Range Status   Specimen Description   Final    BLOOD BLOOD LEFT FOREARM Performed at [facility], [address] [place], [place] [address]    Special Requests   Final    BOTTLES DRAWN AEROBIC AND ANAEROBIC Blood Culture adequate volume Performed at [facility], [address] [place], [place] [address]    Culture  Setup Time   Final    IN BOTH AEROBIC AND ANAEROBIC BOTTLES GRAM POSITIVE COCCI CRITICAL RESULT CALLED TO, READ BACK BY AND VERIFIED WITH: Cindra Presume PharmD 16:55 11/05/18 ([email or web address]) Performed at [facility], [address] [place], [place] [address]    Culture (A)  Final    STAPHYLOCOCCUS SPECIES (COAGULASE NEGATIVE) THE SIGNIFICANCE OF ISOLATING THIS ORGANISM FROM A SINGLE  SET OF BLOOD CULTURES WHEN MULTIPLE SETS ARE DRAWN IS UNCERTAIN. PLEASE NOTIFY THE MICROBIOLOGY DEPARTMENT WITHIN ONE WEEK IF SPECIATION AND SENSITIVITIES ARE REQUIRED.    Report Status 11/07/2018 FINAL  Final  Blood Culture ID Panel (Reflexed)     Status: Abnormal   Collection Time: 11/04/18  4:58 PM  Result Value Ref Range Status   Enterococcus species NOT DETECTED NOT DETECTED Final   Listeria monocytogenes NOT DETECTED NOT DETECTED Final   Staphylococcus species DETECTED (A) NOT DETECTED Final    Comment: Methicillin (oxacillin) susceptible coagulase negative staphylococcus. Possible blood culture contaminant (unless isolated from more than one blood culture draw or clinical case suggests pathogenicity). No  antibiotic treatment is indicated for blood  culture contaminants. CRITICAL RESULT CALLED TO, READ BACK BY AND VERIFIED WITH: Cindra PresumeH. Baird PharmD 16:55 11/05/18 ([email or web address])    Staphylococcus aureus (BCID) NOT DETECTED NOT DETECTED Final   Methicillin resistance NOT DETECTED NOT DETECTED Final   Streptococcus species NOT DETECTED NOT DETECTED Final   Streptococcus agalactiae NOT DETECTED NOT DETECTED Final   Streptococcus pneumoniae NOT DETECTED NOT DETECTED Final   Streptococcus pyogenes NOT DETECTED NOT DETECTED Final   Acinetobacter baumannii NOT DETECTED NOT DETECTED Final   Enterobacteriaceae species NOT DETECTED NOT DETECTED Final   Enterobacter cloacae complex NOT DETECTED NOT DETECTED Final   Escherichia coli NOT DETECTED NOT DETECTED Final   Klebsiella oxytoca NOT DETECTED NOT DETECTED Final   Klebsiella pneumoniae NOT DETECTED NOT DETECTED Final   Proteus species NOT DETECTED NOT DETECTED Final   Serratia marcescens NOT DETECTED NOT DETECTED Final   Haemophilus influenzae NOT DETECTED NOT DETECTED Final   Neisseria meningitidis NOT DETECTED NOT DETECTED Final   Pseudomonas aeruginosa NOT DETECTED NOT DETECTED Final   Candida albicans NOT DETECTED NOT DETECTED Final   Candida glabrata NOT DETECTED NOT DETECTED Final   Candida krusei NOT DETECTED NOT DETECTED Final   Candida parapsilosis NOT DETECTED NOT DETECTED Final   Candida tropicalis NOT DETECTED NOT DETECTED Final    Comment: Performed at [facility], [address] [place], [place] [address]  Culture, sputum-assessment     Status: None   Collection Time: 11/05/18  8:18 PM  Result Value Ref Range Status   Specimen Description EXPECTORATED SPUTUM  Final   Special Requests NONE  Final   Sputum evaluation   Final    THIS SPECIMEN IS ACCEPTABLE FOR SPUTUM CULTURE Performed at [facility], [address] [place], [place] [address]    Report Status 11/11/2018 FINAL  Final  Culture, respiratory     Status: None  (Preliminary result)   Collection Time: 11/05/18  8:18 PM  Result Value Ref Range Status   Specimen Description EXPECTORATED SPUTUM  Final   Special Requests NONE Reflexed from [id number]  Final   Gram Stain   Final    MODERATE WBC PRESENT,BOTH PMN AND MONONUCLEAR ABUNDANT GRAM VARIABLE ROD ABUNDANT GRAM POSITIVE COCCI RARE YEAST RARE SQUAMOUS EPITHELIAL CELLS PRESENT    Culture   Final    CULTURE REINCUBATED FOR BETTER GROWTH Performed at [facility], [address] [place], [place] [address]    Report Status PENDING  Incomplete  MRSA PCR Screening     Status: None   Collection Time: 11/08/18 11:04 AM  Result Value Ref Range Status   MRSA by PCR NEGATIVE NEGATIVE Final    Comment:        The GeneXpert MRSA Assay (FDA approved for NASAL specimens only),  is one component of a comprehensive MRSA colonization surveillance program. It is not intended to diagnose MRSA infection nor to guide or monitor treatment for MRSA infections. Performed at [facility], [address] [place], [place] [address]          Radiology Studies: No results found.      Scheduled Meds: . diltiazem  360 mg Oral Daily  . guaiFENesin-dextromethorphan  10 mL Oral Q6H  . lisinopril  5 mg Oral Daily  . metoprolol tartrate  75 mg Oral BID  . mirtazapine  45 mg Oral QHS  . sodium chloride flush  3 mL Intravenous Q12H  . sodium chloride flush  3 mL Intravenous Q12H  . venlafaxine  50 mg Oral BID WC   Continuous Infusions: . sodium chloride       LOS: 7 days     Jacquelin Hawkingalph Deshanda Molitor, MD [facility] 11/12/2018, 12:48 PM  If 7PM-7AM, please contact night-coverage [email or web address]

## 2018-11-13 LAB — BASIC METABOLIC PANEL
Anion gap: 15 (ref 5–15)
BUN: 23 mg/dL (ref 8–23)
CALCIUM: 9 mg/dL (ref 8.9–10.3)
CO2: 25 mmol/L (ref 22–32)
Chloride: 86 mmol/L — ABNORMAL LOW (ref 98–111)
Creatinine, Ser: 0.99 mg/dL (ref 0.44–1.00)
GFR calc Af Amer: >60 mL/min (ref >60)
GFR calc non Af Amer: 56 mL/min — ABNORMAL LOW (ref >60)
Glucose, Bld: 125 mg/dL — ABNORMAL HIGH (ref 70–99)
Potassium: 3.8 mmol/L (ref 3.5–5.1)
Sodium: 126 mmol/L — ABNORMAL LOW (ref 135–145)

## 2018-11-13 LAB — GLUCOSE, CAPILLARY: Glucose-Capillary: 198 mg/dL — ABNORMAL HIGH (ref 70–99)

## 2018-11-13 MED ORDER — BISACODYL 10 MG RE SUPP
10.0000 mg | Freq: Once | RECTAL | Status: DC
  Filled 2018-11-13 (×2): qty 1

## 2018-11-13 NOTE — Progress Notes (Signed)
Progress Note  Patient [name] Date of Encounter: 11/13/2018  Primary Cardiologist: [facility]  Subjective   Eating breakfast Still with congested cough   Inpatient Medications    Scheduled Meds: . diltiazem  360 mg Oral Daily  . guaiFENesin-dextromethorphan  10 mL Oral Q6H  . lisinopril  5 mg Oral Daily  . metoprolol tartrate  75 mg Oral BID  . mirtazapine  45 mg Oral QHS  . sodium chloride flush  3 mL Intravenous Q12H  . sodium chloride flush  3 mL Intravenous Q12H  . venlafaxine  50 mg Oral BID WC   Continuous Infusions: . sodium chloride     PRN Meds: sodium chloride, acetaminophen **OR** acetaminophen, benzonatate, bisacodyl, hydrALAZINE, ipratropium-albuterol, LORazepam, morphine injection, ondansetron (ZOFRAN) IV, polyethylene glycol, sodium chloride flush, temazepam, traMADol   Vital Signs    Vitals:   11/12/18 2024 11/12/18 2249 11/13/18 0500 11/13/18 0814  BP: (!) 150/79  (!) 157/84 (!) 158/80  Pulse: 88 87 68   Resp: 20  (!) 22   Temp: 97.6 F (36.4 C)  98.5 F (36.9 C)   TempSrc: Oral  Oral   SpO2: 93%  94%   Weight:   108.6 kg   Height:        Intake/Output Summary (Last 24 hours) at 11/13/2018 0835 Last data filed at 11/13/2018 0500 Gross per 24 hour  Intake 1027 ml  Output 552 ml  Net 475 ml   Last 3 Weights 11/13/2018 11/12/2018 11/11/2018  Weight (lbs) 239 lb 8 oz 240 lb 239 lb 12.8 oz  Weight (kg) 108.636 kg 108.863 kg 108.773 kg      Telemetry    afib rates 80-90  - Personally Reviewed  ECG    afib nonspecific ST changes  - Personally Reviewed  Physical Exam  Obese white female  GEN: No acute distress.   Neck: No JVD Cardiac: RRR, no murmurs, rubs, or gallops.  Respiratory: Clear to auscultation bilaterally. GI: Soft, nontender, non-distended  MS: No edema; No deformity. Neuro:  Nonfocal  Psych: Normal affect   Labs    Chemistry Recent Labs  Lab 11/12/18 0958 11/12/18 1817 11/13/18 0434  NA  124* 124* 126*  K 5.5* 3.8 3.8  CL 87* 89* 86*  CO2 20* 22 25  GLUCOSE 161* 145* 125*  BUN 22 23 23   CREATININE 0.88 0.80 0.99  CALCIUM 9.0 8.8* 9.0  GFRNONAA >60 >60 56*  GFRAA >60 >60 >60  ANIONGAP 17* 13 15     Hematology Recent Labs  Lab 11/07/18 0441 11/10/18 0400  WBC 9.3 7.7  RBC 3.98 4.18  HGB 11.4* 11.9*  HCT 36.4 36.9  MCV 91.5 88.3  MCH 28.6 28.5  MCHC 31.3 32.2  RDW 13.4 12.7  PLT 221 369    Cardiac Enzymes No results for input(s): TROPONINI in the last 168 hours. No results for input(s): TROPIPOC in the last 168 hours.   BNPNo results for input(s): BNP, PROBNP in the last 168 hours.   DDimer No results for input(s): DDIMER in the last 168 hours.   Radiology    No results found.  Cardiac Studies   11/09/18 TTE EF 60-65%  Patient Profile     73 y.o. female admitted with influenza and pneumonia with HTN and newly diagnosed afib  Assessment & Plan    Afib:  No anticoagulation due to history of 2 major GI bleeds needs EGD/Colon before consideration of starting Continue 75 bid lopressor Cardizem  increased 11/11/18  Rates very well controlled 80-90 at rest   HTN  Well controlled.  Continue current medications and low sodium Dash type diet.    Pneumonia:  Should have repeat CXR per primary service CT 11/04/18 upper lobe pneumonitis  Will sign off  Outpatient f/u [facility]       For questions or updates, please contact [facility] Please consult [email or web address] for contact info under        Signed, Charlton Haws, MD  11/13/2018, 8:35 AM

## 2018-11-13 NOTE — Progress Notes (Signed)
PROGRESS NOTE    Adrienne Stone  XBM:[id number] DOB: Jun 07, 1946 DOA: 11/04/2018 PCP: Darryl Lentaylor, Amanda, PA-C   Brief Narrative: Adrienne Stone is a 73 y.o. female who presented with chills, cough, fatigue, nausea vomiting. She does have significant past medical history for hypertension, depression and obesity. She presented secondary to ILI found to have influenza A infection and developed atrial fibrillation with RVR. Rate stable.   Assessment & Plan:   Principal Problem:   Influenza A Active Problems:   Acute respiratory failure with hypoxia (HCC)   Acute respiratory distress   Hypertensive urgency   Normocytic anemia   Atrial fibrillation with RVR (HCC)   Hyponatremia Patient takes hydrochlorothiazide as an outpatient "as needed." Likely secondary to hydrochlorothiazide resumed inpatient. Continues to decrease, however, last dose of hydrochlorothiazide was 11/11/2018. Improving slowly. Patient with some mild lightheadedness. -Encourage oral intake. Will hold off on IV fluids for now  Influenza A infection Completed Tamiflu course  Atrial fibrillation with RVR New onset. Cardiology on board -Cardiology recommendations: Metoprolol, Cardizem, outpatient follow-up  Acute respiratory failure with hypoxia Secondary to influenza infection. CT scan with infiltrates likely related to influenza. Patient currently on room air with mild respiratory complaints that have improved.  Essential hypertension Uncontrolled with hypertensive urgency. -Continue medications per cardiology recommendations  Depression -Continue temazepam, lorazepam, mirtazapine, venlafaxine  Chronic multifactorial anemia Stable  Hypokalemia Supplementation given.  DVT prophylaxis: Lovenox Code Status:   Code Status: DNR Family Communication: None at bedside Disposition Plan: Discharge to SNF pending cardiology recommendations and improvement of sodium   Consultants:   Cardiology  Procedures:    Transthoracic Echocardiogram (11/09/2018) IMPRESSIONS    1. The left ventricle has normal systolic function of 60-65%. The cavity size is normal. There is moderate concentric left ventricular wall hypertrophy. . The left ventricular diastology could not be evaluatedsecondary to atrial fibrillation.  2. Severely dilated left atrial size.  3. There is moderate mitral annular calcification present. Mitral regurgitation is trivial by color flow Doppler.  4. No atrial level shunt detected by color flow Doppler.  Antimicrobials:  Ceftriaxone (1/29)  Azithromycin (1/29)  Tamiflu (1/30>>2/3)    Subjective: Some lightheadedness today. No dizziness.  Objective: Vitals:   11/12/18 2249 11/13/18 0500 11/13/18 0814 11/13/18 1145  BP:  (!) 157/84 (!) 158/80 (!) 150/85  Pulse: 87 68  71  Resp:  (!) 22  18  Temp:  98.5 F (36.9 C)  99 F (37.2 C)  TempSrc:  Oral  Axillary  SpO2:  94%  93%  Weight:  108.6 kg    Height:        Intake/Output Summary (Last 24 hours) at 11/13/2018 1242 Last data filed at 11/13/2018 1135 Gross per 24 hour  Intake 1149 ml  Output 551 ml  Net 598 ml   Filed Weights   11/11/18 0639 11/12/18 0445 11/13/18 0500  Weight: 108.8 kg 108.9 kg 108.6 kg    Examination:  General exam: Appears calm and comfortable Respiratory system: Clear to auscultation. Respiratory effort normal. Cardiovascular system: S1 & S2 heard, RRR. No murmurs, rubs, gallops or clicks. Gastrointestinal system: Abdomen is nondistended, soft and nontender. No organomegaly or masses felt. Normal bowel sounds heard. Central nervous system: Alert and oriented. No focal neurological deficits. Extremities: No edema. No calf tenderness Skin: No cyanosis. No rashes Psychiatry: Judgement and insight appear normal. Flat affect     Data Reviewed: I have personally reviewed following labs and imaging studies  CBC: Recent Labs  Lab 11/07/18 0441  11/10/18 0400  WBC 9.3 7.7  NEUTROABS 7.0  5.9  HGB 11.4* 11.9*  HCT 36.4 36.9  MCV 91.5 88.3  PLT 221 369   Basic Metabolic Panel: Recent Labs  Lab 11/10/18 0400 11/11/18 0452 11/12/18 0958 11/12/18 1817 11/13/18 0434  NA 130* 127* 124* 124* 126*  K 4.0 3.7 5.5* 3.8 3.8  CL 90* 89* 87* 89* 86*  CO2 28 29 20* 22 25  GLUCOSE 140* 125* 161* 145* 125*  BUN 17 13 22 23 23   CREATININE 0.77 0.74 0.88 0.80 0.99  CALCIUM 9.3 9.0 9.0 8.8* 9.0   GFR: Estimated Creatinine Clearance: 58.9 mL/min (by C-G formula based on SCr of 0.99 mg/dL). Liver Function Tests: No results for input(s): AST, ALT, ALKPHOS, BILITOT, PROT, ALBUMIN in the last 168 hours. No results for input(s): LIPASE, AMYLASE in the last 168 hours. No results for input(s): AMMONIA in the last 168 hours. Coagulation Profile: No results for input(s): INR, PROTIME in the last 168 hours. Cardiac Enzymes: No results for input(s): CKTOTAL, CKMB, CKMBINDEX, TROPONINI in the last 168 hours. BNP (last 3 results) No results for input(s): PROBNP in the last 8760 hours. HbA1C: No results for input(s): HGBA1C in the last 72 hours. CBG: Recent Labs  Lab 11/09/18 0728 11/10/18 0757 11/11/18 0753 11/12/18 0741 11/13/18 0837  GLUCAP 228* 130* 110* 126* 198*   Lipid Profile: No results for input(s): CHOL, HDL, LDLCALC, TRIG, CHOLHDL, LDLDIRECT in the last 72 hours. Thyroid Function Tests: No results for input(s): TSH, T4TOTAL, FREET4, T3FREE, THYROIDAB in the last 72 hours. Anemia Panel: No results for input(s): VITAMINB12, FOLATE, FERRITIN, TIBC, IRON, RETICCTPCT in the last 72 hours. Sepsis Labs: No results for input(s): PROCALCITON, LATICACIDVEN in the last 168 hours.  Recent Results (from the past 240 hour(s))  Blood Culture (routine x 2)     Status: None   Collection Time: 11/04/18  4:50 PM  Result Value Ref Range Status   Specimen Description   Final    BLOOD RIGHT ANTECUBITAL Performed at [facility] [place], [address] [place], [place]  [address]    Special Requests   Final    BOTTLES DRAWN AEROBIC AND ANAEROBIC Blood Culture adequate volume Performed at [facility] [place], [address] [place], [place] [address]    Culture   Final    NO GROWTH 5 DAYS Performed at [facility], [address] [place], [place] [address]    Report Status 11/09/2018 FINAL  Final  Blood Culture (routine x 2)     Status: Abnormal   Collection Time: 11/04/18  4:58 PM  Result Value Ref Range Status   Specimen Description   Final    BLOOD BLOOD LEFT FOREARM Performed at [facility] [place], [address] [place], [place] [address]    Special Requests   Final    BOTTLES DRAWN AEROBIC AND ANAEROBIC Blood Culture adequate volume Performed at [facility] [place], [address] [place], [place] [address]    Culture  Setup Time   Final    IN BOTH AEROBIC AND ANAEROBIC BOTTLES GRAM POSITIVE COCCI CRITICAL RESULT CALLED TO, READ BACK BY AND VERIFIED WITH: Cindra PresumeH. Baird PharmD 16:55 11/05/18 ([email or web address]) Performed at [facility], [address] [place], [place] [address]    Culture (A)  Final    STAPHYLOCOCCUS SPECIES (COAGULASE NEGATIVE) THE SIGNIFICANCE OF ISOLATING THIS ORGANISM FROM A SINGLE SET OF BLOOD CULTURES WHEN MULTIPLE SETS  ARE DRAWN IS UNCERTAIN. PLEASE NOTIFY THE MICROBIOLOGY DEPARTMENT WITHIN ONE WEEK IF SPECIATION AND SENSITIVITIES ARE REQUIRED.    Report Status 11/07/2018 FINAL  Final  Blood Culture ID Panel (Reflexed)     Status: Abnormal   Collection Time: 11/04/18  4:58 PM  Result Value Ref Range Status   Enterococcus species NOT DETECTED NOT DETECTED Final   Listeria monocytogenes NOT DETECTED NOT DETECTED Final   Staphylococcus species DETECTED (A) NOT DETECTED Final    Comment: Methicillin (oxacillin) susceptible coagulase negative staphylococcus. Possible blood culture contaminant (unless isolated from more than one blood culture draw or clinical case suggests pathogenicity). No antibiotic treatment  is indicated for blood  culture contaminants. CRITICAL RESULT CALLED TO, READ BACK BY AND VERIFIED WITH: Cindra Presume PharmD 16:55 11/05/18 ([email or web address])    Staphylococcus aureus (BCID) NOT DETECTED NOT DETECTED Final   Methicillin resistance NOT DETECTED NOT DETECTED Final   Streptococcus species NOT DETECTED NOT DETECTED Final   Streptococcus agalactiae NOT DETECTED NOT DETECTED Final   Streptococcus pneumoniae NOT DETECTED NOT DETECTED Final   Streptococcus pyogenes NOT DETECTED NOT DETECTED Final   Acinetobacter baumannii NOT DETECTED NOT DETECTED Final   Enterobacteriaceae species NOT DETECTED NOT DETECTED Final   Enterobacter cloacae complex NOT DETECTED NOT DETECTED Final   Escherichia coli NOT DETECTED NOT DETECTED Final   Klebsiella oxytoca NOT DETECTED NOT DETECTED Final   Klebsiella pneumoniae NOT DETECTED NOT DETECTED Final   Proteus species NOT DETECTED NOT DETECTED Final   Serratia marcescens NOT DETECTED NOT DETECTED Final   Haemophilus influenzae NOT DETECTED NOT DETECTED Final   Neisseria meningitidis NOT DETECTED NOT DETECTED Final   Pseudomonas aeruginosa NOT DETECTED NOT DETECTED Final   Candida albicans NOT DETECTED NOT DETECTED Final   Candida glabrata NOT DETECTED NOT DETECTED Final   Candida krusei NOT DETECTED NOT DETECTED Final   Candida parapsilosis NOT DETECTED NOT DETECTED Final   Candida tropicalis NOT DETECTED NOT DETECTED Final    Comment: Performed at [facility], [address] [place], [place] [address]  Culture, sputum-assessment     Status: None   Collection Time: 11/05/18  8:18 PM  Result Value Ref Range Status   Specimen Description EXPECTORATED SPUTUM  Final   Special Requests NONE  Final   Sputum evaluation   Final    THIS SPECIMEN IS ACCEPTABLE FOR SPUTUM CULTURE Performed at [facility], [address], [place], [place] [address]    Report Status 11/11/2018 FINAL  Final  Culture, respiratory     Status: None (Preliminary result)    Collection Time: 11/05/18  8:18 PM  Result Value Ref Range Status   Specimen Description EXPECTORATED SPUTUM  Final   Special Requests NONE Reflexed from [id number]  Final   Gram Stain   Final    MODERATE WBC PRESENT,BOTH PMN AND MONONUCLEAR ABUNDANT GRAM VARIABLE ROD ABUNDANT GRAM POSITIVE COCCI RARE YEAST RARE SQUAMOUS EPITHELIAL CELLS PRESENT    Culture   Final    MODERATE YEAST IDENTIFICATION TO FOLLOW Performed at [facility], [address], [place], [place] [address]    Report Status PENDING  Incomplete  MRSA PCR Screening     Status: None   Collection Time: 11/08/18 11:04 AM  Result Value Ref Range Status   MRSA by PCR NEGATIVE NEGATIVE Final    Comment:        The GeneXpert MRSA Assay (FDA approved for NASAL specimens only), is one component of a comprehensive MRSA  colonization surveillance program. It is not intended to diagnose MRSA infection nor to guide or monitor treatment for MRSA infections. Performed at [facility], [address], [place], [place] [address]          Radiology Studies: No results found.      Scheduled Meds: . bisacodyl  10 mg Rectal Once  . diltiazem  360 mg Oral Daily  . guaiFENesin-dextromethorphan  10 mL Oral Q6H  . lisinopril  5 mg Oral Daily  . metoprolol tartrate  75 mg Oral BID  . mirtazapine  45 mg Oral QHS  . sodium chloride flush  3 mL Intravenous Q12H  . sodium chloride flush  3 mL Intravenous Q12H  . venlafaxine  50 mg Oral BID WC   Continuous Infusions: . sodium chloride       LOS: 8 days     Jacquelin Hawking, MD [facility] 11/13/2018, 12:42 PM  If 7PM-7AM, please contact night-coverage [email or web address]

## 2018-11-13 NOTE — Social Work (Signed)
CSW met with patient at bedside and provided CMS SNF list and bed offers. Patient reviewed and chose [facility] in [place] [facility]. [facility] starting patient's [employer] authorization today. Patient can admit to the facility once Holli Humbles is received. CSW to follow and support with discharge planning.  Estanislado Emms, LCSW [phone]

## 2018-11-14 ENCOUNTER — Inpatient Hospital Stay (HOSPITAL_COMMUNITY): Payer: Medicare HMO

## 2018-11-14 DIAGNOSIS — R0682 Tachypnea, not elsewhere classified: Secondary | ICD-10-CM

## 2018-11-14 LAB — BASIC METABOLIC PANEL
ANION GAP: 14 (ref 5–15)
Anion gap: 15 (ref 5–15)
Anion gap: 9 (ref 5–15)
BUN: 26 mg/dL — ABNORMAL HIGH (ref 8–23)
BUN: 39 mg/dL — ABNORMAL HIGH (ref 8–23)
BUN: 45 mg/dL — ABNORMAL HIGH (ref 8–23)
CO2: 23 mmol/L (ref 22–32)
CO2: 25 mmol/L (ref 22–32)
CO2: 24 mmol/L (ref 22–32)
Calcium: 8.9 mg/dL (ref 8.9–10.3)
Calcium: 8.8 mg/dL — ABNORMAL LOW (ref 8.9–10.3)
Calcium: 8.3 mg/dL — ABNORMAL LOW (ref 8.9–10.3)
Chloride: 84 mmol/L — ABNORMAL LOW (ref 98–111)
Chloride: 87 mmol/L — ABNORMAL LOW (ref 98–111)
Chloride: 85 mmol/L — ABNORMAL LOW (ref 98–111)
Creatinine, Ser: 0.9 mg/dL (ref 0.44–1.00)
Creatinine, Ser: 0.98 mg/dL (ref 0.44–1.00)
Creatinine, Ser: 1.13 mg/dL — ABNORMAL HIGH (ref 0.44–1.00)
GFR calc Af Amer: >60 mL/min (ref >60)
GFR calc Af Amer: >60 mL/min (ref >60)
GFR calc Af Amer: 55 mL/min — ABNORMAL LOW (ref >60)
GFR calc non Af Amer: >60 mL/min (ref >60)
GFR calc non Af Amer: 57 mL/min — ABNORMAL LOW (ref >60)
GFR calc non Af Amer: 48 mL/min — ABNORMAL LOW (ref >60)
Glucose, Bld: 113 mg/dL — ABNORMAL HIGH (ref 70–99)
Glucose, Bld: 114 mg/dL — ABNORMAL HIGH (ref 70–99)
Glucose, Bld: 133 mg/dL — ABNORMAL HIGH (ref 70–99)
Potassium: 4.1 mmol/L (ref 3.5–5.1)
Potassium: 4.6 mmol/L (ref 3.5–5.1)
Potassium: 4 mmol/L (ref 3.5–5.1)
Sodium: 122 mmol/L — ABNORMAL LOW (ref 135–145)
Sodium: 121 mmol/L — ABNORMAL LOW (ref 135–145)
Sodium: 123 mmol/L — ABNORMAL LOW (ref 135–145)

## 2018-11-14 LAB — CULTURE, RESPIRATORY W GRAM STAIN

## 2018-11-14 LAB — GLUCOSE, CAPILLARY: Glucose-Capillary: 130 mg/dL — ABNORMAL HIGH (ref 70–99)

## 2018-11-14 LAB — SODIUM, URINE, RANDOM: Sodium, Ur: 19 mmol/L

## 2018-11-14 LAB — OSMOLALITY, URINE: Osmolality, Ur: 728 mOsm/kg (ref 300–900)

## 2018-11-14 MED ORDER — SODIUM CHLORIDE 0.9 % IV SOLN
INTRAVENOUS | Status: DC
  Administered 2018-11-14 – 2018-11-17 (×6): via INTRAVENOUS

## 2018-11-14 NOTE — Progress Notes (Signed)
PROGRESS NOTE    Adrienne Stone  ZOX:[id number] DOB: 11/16/1945 DOA: 11/04/2018 PCP: Darryl Lentaylor, Amanda, PA-C   Brief Narrative: Adrienne Stone is a 73 y.o. female who presented with chills, cough, fatigue, nausea vomiting. She does have significant past medical history for hypertension, depression and obesity. She presented secondary to ILI found to have influenza A infection and developed atrial fibrillation with RVR. Rate stable.   Assessment & Plan:   Principal Problem:   Influenza A Active Problems:   Acute respiratory failure with hypoxia (HCC)   Acute respiratory distress   Hypertensive urgency   Normocytic anemia   Atrial fibrillation with RVR (HCC)   Hyponatremia Patient takes hydrochlorothiazide as an outpatient "as needed." Likely secondary to hydrochlorothiazide resumed inpatient. Initial improvement now drop overnight. Patient feels thirsty. Euvolemic on exam. -Repeat urine sodium/osmolality -Will start IV fluids; will wait for urine studies -Repeat BMP q6 hours after starting intervention  Influenza A infection Completed Tamiflu course  Atrial fibrillation with RVR New onset. Cardiology on board -Cardiology recommendations: Metoprolol, Cardizem, outpatient follow-up  Acute respiratory failure with hypoxia Secondary to influenza infection. CT scan with infiltrates likely related to influenza. Patient currently on room air with mild respiratory complaints that have improved.  Essential hypertension Uncontrolled with hypertensive urgency. -Continue medications per cardiology recommendations  Depression -Continue temazepam, lorazepam, mirtazapine, venlafaxine  Chronic multifactorial anemia Stable  Hypokalemia Supplementation given.  Tachypnea Asymptomatic. -Chest x-ray  DVT prophylaxis: Lovenox Code Status:   Code Status: DNR Family Communication: None at bedside Disposition Plan: Discharge to SNF pending cardiology recommendations and  improvement of sodium   Consultants:   Cardiology  Procedures:   Transthoracic Echocardiogram (11/09/2018) IMPRESSIONS    1. The left ventricle has normal systolic function of 60-65%. The cavity size is normal. There is moderate concentric left ventricular wall hypertrophy. . The left ventricular diastology could not be evaluatedsecondary to atrial fibrillation.  2. Severely dilated left atrial size.  3. There is moderate mitral annular calcification present. Mitral regurgitation is trivial by color flow Doppler.  4. No atrial level shunt detected by color flow Doppler.  Antimicrobials:  Ceftriaxone (1/29)  Azithromycin (1/29)  Tamiflu (1/30>>2/3)    Subjective: Still with lightheadedness today. Feels fatigued.  Objective: Vitals:   11/13/18 1145 11/13/18 2137 11/13/18 2250 11/14/18 0525  BP: (!) 150/85 (!) 153/90 (!) 158/77 (!) 155/90  Pulse: 71 93 83 81  Resp: 18 18  18   Temp: 99 F (37.2 C) 98.6 F (37 C)  97.7 F (36.5 C)  TempSrc: Axillary Oral  Oral  SpO2: 93% 96%  92%  Weight:    109 kg  Height:        Intake/Output Summary (Last 24 hours) at 11/14/2018 1026 Last data filed at 11/14/2018 0747 Gross per 24 hour  Intake 567 ml  Output 700 ml  Net -133 ml   Filed Weights   11/12/18 0445 11/13/18 0500 11/14/18 0525  Weight: 108.9 kg 108.6 kg 109 kg    Examination:  General exam: Appears calm and comfortable  Respiratory system: Diminished, rhonchi, tachypnea Cardiovascular system: S1 & S2 heard, RRR. No murmurs, rubs, gallops or clicks. Gastrointestinal system: Abdomen is nondistended, soft and nontender. No organomegaly or masses felt. Normal bowel sounds heard. Central nervous system: Alert and oriented. No focal neurological deficits. Extremities: No edema. No calf tenderness Skin: No cyanosis. No rashes Psychiatry: Judgement and insight appear normal. Mood & affect appropriate.     Data Reviewed: I have personally reviewed following labs  and  imaging studies  CBC: Recent Labs  Lab 11/10/18 0400  WBC 7.7  NEUTROABS 5.9  HGB 11.9*  HCT 36.9  MCV 88.3  PLT 369   Basic Metabolic Panel: Recent Labs  Lab 11/11/18 0452 11/12/18 0958 11/12/18 1817 11/13/18 0434 11/14/18 0446  NA 127* 124* 124* 126* 122*  K 3.7 5.5* 3.8 3.8 4.1  CL 89* 87* 89* 86* 84*  CO2 29 20* 22 25 23   GLUCOSE 125* 161* 145* 125* 113*  BUN 13 22 23 23  26*  CREATININE 0.74 0.88 0.80 0.99 0.90  CALCIUM 9.0 9.0 8.8* 9.0 8.9   GFR: Estimated Creatinine Clearance: 64.9 mL/min (by C-G formula based on SCr of 0.9 mg/dL). Liver Function Tests: No results for input(s): AST, ALT, ALKPHOS, BILITOT, PROT, ALBUMIN in the last 168 hours. No results for input(s): LIPASE, AMYLASE in the last 168 hours. No results for input(s): AMMONIA in the last 168 hours. Coagulation Profile: No results for input(s): INR, PROTIME in the last 168 hours. Cardiac Enzymes: No results for input(s): CKTOTAL, CKMB, CKMBINDEX, TROPONINI in the last 168 hours. BNP (last 3 results) No results for input(s): PROBNP in the last 8760 hours. HbA1C: No results for input(s): HGBA1C in the last 72 hours. CBG: Recent Labs  Lab 11/10/18 0757 11/11/18 0753 11/12/18 0741 11/13/18 0837 11/14/18 0737  GLUCAP 130* 110* 126* 198* 130*   Lipid Profile: No results for input(s): CHOL, HDL, LDLCALC, TRIG, CHOLHDL, LDLDIRECT in the last 72 hours. Thyroid Function Tests: No results for input(s): TSH, T4TOTAL, FREET4, T3FREE, THYROIDAB in the last 72 hours. Anemia Panel: No results for input(s): VITAMINB12, FOLATE, FERRITIN, TIBC, IRON, RETICCTPCT in the last 72 hours. Sepsis Labs: No results for input(s): PROCALCITON, LATICACIDVEN in the last 168 hours.  Recent Results (from the past 240 hour(s))  Blood Culture (routine x 2)     Status: None   Collection Time: 11/04/18  4:50 PM  Result Value Ref Range Status   Specimen Description   Final    BLOOD RIGHT ANTECUBITAL Performed at [facility] [place], [address] [place], [place] [address]    Special Requests   Final    BOTTLES DRAWN AEROBIC AND ANAEROBIC Blood Culture adequate volume Performed at [facility] [place], [address] [place], [place] [address]    Culture   Final    NO GROWTH 5 DAYS Performed at [facility], [address] [place], [place] [address]    Report Status 11/09/2018 FINAL  Final  Blood Culture (routine x 2)     Status: Abnormal   Collection Time: 11/04/18  4:58 PM  Result Value Ref Range Status   Specimen Description   Final    BLOOD BLOOD LEFT FOREARM Performed at [facility] [place], [address] [place], [place] [address]    Special Requests   Final    BOTTLES DRAWN AEROBIC AND ANAEROBIC Blood Culture adequate volume Performed at [facility] [place], [address] [place], [place] [address]    Culture  Setup Time   Final    IN BOTH AEROBIC AND ANAEROBIC BOTTLES GRAM POSITIVE COCCI CRITICAL RESULT CALLED TO, READ BACK BY AND VERIFIED WITH: Cindra PresumeH. Baird PharmD 16:55 11/05/18 ([email or web address]) Performed at [facility], [address] [place], [place] [address]    Culture (A)  Final    STAPHYLOCOCCUS SPECIES (COAGULASE NEGATIVE) THE SIGNIFICANCE OF ISOLATING THIS ORGANISM FROM A SINGLE SET OF BLOOD CULTURES  WHEN MULTIPLE SETS ARE DRAWN IS UNCERTAIN. PLEASE NOTIFY THE MICROBIOLOGY DEPARTMENT WITHIN ONE WEEK IF SPECIATION AND SENSITIVITIES ARE REQUIRED.    Report Status 11/07/2018 FINAL  Final  Blood Culture ID Panel (Reflexed)     Status: Abnormal   Collection Time: 11/04/18  4:58 PM  Result Value Ref Range Status   Enterococcus species NOT DETECTED NOT DETECTED Final   Listeria monocytogenes NOT DETECTED NOT DETECTED Final   Staphylococcus species DETECTED (A) NOT DETECTED Final    Comment: Methicillin (oxacillin) susceptible coagulase negative staphylococcus. Possible blood culture contaminant (unless isolated from more than one blood culture draw or  clinical case suggests pathogenicity). No antibiotic treatment is indicated for blood  culture contaminants. CRITICAL RESULT CALLED TO, READ BACK BY AND VERIFIED WITH: Cindra Presume PharmD 16:55 11/05/18 ([email or web address])    Staphylococcus aureus (BCID) NOT DETECTED NOT DETECTED Final   Methicillin resistance NOT DETECTED NOT DETECTED Final   Streptococcus species NOT DETECTED NOT DETECTED Final   Streptococcus agalactiae NOT DETECTED NOT DETECTED Final   Streptococcus pneumoniae NOT DETECTED NOT DETECTED Final   Streptococcus pyogenes NOT DETECTED NOT DETECTED Final   Acinetobacter baumannii NOT DETECTED NOT DETECTED Final   Enterobacteriaceae species NOT DETECTED NOT DETECTED Final   Enterobacter cloacae complex NOT DETECTED NOT DETECTED Final   Escherichia coli NOT DETECTED NOT DETECTED Final   Klebsiella oxytoca NOT DETECTED NOT DETECTED Final   Klebsiella pneumoniae NOT DETECTED NOT DETECTED Final   Proteus species NOT DETECTED NOT DETECTED Final   Serratia marcescens NOT DETECTED NOT DETECTED Final   Haemophilus influenzae NOT DETECTED NOT DETECTED Final   Neisseria meningitidis NOT DETECTED NOT DETECTED Final   Pseudomonas aeruginosa NOT DETECTED NOT DETECTED Final   Candida albicans NOT DETECTED NOT DETECTED Final   Candida glabrata NOT DETECTED NOT DETECTED Final   Candida krusei NOT DETECTED NOT DETECTED Final   Candida parapsilosis NOT DETECTED NOT DETECTED Final   Candida tropicalis NOT DETECTED NOT DETECTED Final    Comment: Performed at [facility], [address] [place], [place] [address]  Culture, sputum-assessment     Status: None   Collection Time: 11/05/18  8:18 PM  Result Value Ref Range Status   Specimen Description EXPECTORATED SPUTUM  Final   Special Requests NONE  Final   Sputum evaluation   Final    THIS SPECIMEN IS ACCEPTABLE FOR SPUTUM CULTURE Performed at [facility], [address] [place], [place] [address]    Report Status 11/11/2018 FINAL  Final   Culture, respiratory     Status: None (Preliminary result)   Collection Time: 11/05/18  8:18 PM  Result Value Ref Range Status   Specimen Description EXPECTORATED SPUTUM  Final   Special Requests NONE Reflexed from [id number]  Final   Gram Stain   Final    MODERATE WBC PRESENT,BOTH PMN AND MONONUCLEAR ABUNDANT GRAM VARIABLE ROD ABUNDANT GRAM POSITIVE COCCI RARE YEAST RARE SQUAMOUS EPITHELIAL CELLS PRESENT    Culture   Final    MODERATE YEAST IDENTIFICATION TO FOLLOW Performed at [facility], [address], [place], [place] [address]    Report Status PENDING  Incomplete  MRSA PCR Screening     Status: None   Collection Time: 11/08/18 11:04 AM  Result Value Ref Range Status   MRSA by PCR NEGATIVE NEGATIVE Final    Comment:        The GeneXpert MRSA Assay (FDA approved for NASAL specimens only), is one component of  a comprehensive MRSA colonization surveillance program. It is not intended to diagnose MRSA infection nor to guide or monitor treatment for MRSA infections. Performed at [facility], [address], [place], [place] [address]          Radiology Studies: No results found.      Scheduled Meds: . bisacodyl  10 mg Rectal Once  . diltiazem  360 mg Oral Daily  . guaiFENesin-dextromethorphan  10 mL Oral Q6H  . lisinopril  5 mg Oral Daily  . metoprolol tartrate  75 mg Oral BID  . mirtazapine  45 mg Oral QHS  . sodium chloride flush  3 mL Intravenous Q12H  . sodium chloride flush  3 mL Intravenous Q12H  . venlafaxine  50 mg Oral BID WC   Continuous Infusions: . sodium chloride       LOS: 9 days     Jacquelin Hawking, MD [facility] 11/14/2018, 10:26 AM  If 7PM-7AM, please contact night-coverage [email or web address]

## 2018-11-15 DIAGNOSIS — I10 Essential (primary) hypertension: Secondary | ICD-10-CM

## 2018-11-15 LAB — BASIC METABOLIC PANEL
Anion gap: 13 (ref 5–15)
Anion gap: 11 (ref 5–15)
Anion gap: 13 (ref 5–15)
BUN: 41 mg/dL — AB (ref 8–23)
BUN: 42 mg/dL — ABNORMAL HIGH (ref 8–23)
BUN: 44 mg/dL — ABNORMAL HIGH (ref 8–23)
CALCIUM: 8 mg/dL — AB (ref 8.9–10.3)
CHLORIDE: 88 mmol/L — AB (ref 98–111)
CO2: 23 mmol/L (ref 22–32)
CO2: 22 mmol/L (ref 22–32)
CO2: 18 mmol/L — AB (ref 22–32)
CREATININE: 0.99 mg/dL (ref 0.44–1.00)
Calcium: 8.1 mg/dL — ABNORMAL LOW (ref 8.9–10.3)
Calcium: 8.3 mg/dL — ABNORMAL LOW (ref 8.9–10.3)
Chloride: 89 mmol/L — ABNORMAL LOW (ref 98–111)
Chloride: 92 mmol/L — ABNORMAL LOW (ref 98–111)
Creatinine, Ser: 1.1 mg/dL — ABNORMAL HIGH (ref 0.44–1.00)
Creatinine, Ser: 1.01 mg/dL — ABNORMAL HIGH (ref 0.44–1.00)
GFR calc Af Amer: >60 mL/min (ref >60)
GFR calc Af Amer: 57 mL/min — ABNORMAL LOW (ref >60)
GFR calc non Af Amer: 56 mL/min — ABNORMAL LOW (ref >60)
GFR calc non Af Amer: 49 mL/min — ABNORMAL LOW (ref >60)
GFR calc non Af Amer: 55 mL/min — ABNORMAL LOW (ref >60)
Glucose, Bld: 113 mg/dL — ABNORMAL HIGH (ref 70–99)
Glucose, Bld: 191 mg/dL — ABNORMAL HIGH (ref 70–99)
Glucose, Bld: 122 mg/dL — ABNORMAL HIGH (ref 70–99)
Potassium: 4 mmol/L (ref 3.5–5.1)
Potassium: 4.2 mmol/L (ref 3.5–5.1)
Potassium: 4.8 mmol/L (ref 3.5–5.1)
Sodium: 124 mmol/L — ABNORMAL LOW (ref 135–145)
Sodium: 122 mmol/L — ABNORMAL LOW (ref 135–145)
Sodium: 123 mmol/L — ABNORMAL LOW (ref 135–145)

## 2018-11-15 LAB — CBC
HCT: 26.8 % — ABNORMAL LOW (ref 36.0–46.0)
Hemoglobin: 9.2 g/dL — ABNORMAL LOW (ref 12.0–15.0)
MCH: 29.8 pg (ref 26.0–34.0)
MCHC: 34.3 g/dL (ref 30.0–36.0)
MCV: 86.7 fL (ref 80.0–100.0)
Platelets: 559 10*3/uL — ABNORMAL HIGH (ref 150–400)
RBC: 3.09 MIL/uL — ABNORMAL LOW (ref 3.87–5.11)
RDW: 12.6 % (ref 11.5–15.5)
WBC: 18.5 10*3/uL — ABNORMAL HIGH (ref 4.0–10.5)
nRBC: 0 % (ref 0.0–0.2)

## 2018-11-15 LAB — MRSA PCR SCREENING: MRSA by PCR: NEGATIVE

## 2018-11-15 LAB — OCCULT BLOOD X 1 CARD TO LAB, STOOL: Fecal Occult Bld: POSITIVE — AB

## 2018-11-15 LAB — OSMOLALITY, URINE: OSMOLALITY UR: 611 mosm/kg (ref 300–900)

## 2018-11-15 LAB — SODIUM, URINE, RANDOM: Sodium, Ur: 14 mmol/L

## 2018-11-15 LAB — PROCALCITONIN: Procalcitonin: 0.1 ng/mL

## 2018-11-15 MED ORDER — FUROSEMIDE 20 MG PO TABS
20.0000 mg | ORAL_TABLET | Freq: Every day | ORAL | Status: DC
  Administered 2018-11-15 – 2018-11-18 (×4): 20 mg via ORAL
  Filled 2018-11-15 (×4): qty 1

## 2018-11-15 MED ORDER — AZITHROMYCIN 250 MG PO TABS
500.0000 mg | ORAL_TABLET | ORAL | Status: DC
  Administered 2018-11-15 – 2018-11-18 (×4): 500 mg via ORAL
  Filled 2018-11-15 (×4): qty 2

## 2018-11-15 MED ORDER — SODIUM CHLORIDE 1 G PO TABS
2.0000 g | ORAL_TABLET | Freq: Two times a day (BID) | ORAL | Status: DC
  Administered 2018-11-15: 2 g via ORAL
  Administered 2018-11-16 (×2): 1 g via ORAL
  Administered 2018-11-16 – 2018-11-18 (×4): 2 g via ORAL
  Filled 2018-11-15 (×2): qty 2
  Filled 2018-11-15: qty 1
  Filled 2018-11-15 (×6): qty 2

## 2018-11-15 MED ORDER — SODIUM CHLORIDE 0.9 % IV SOLN
1.0000 g | INTRAVENOUS | Status: DC
  Administered 2018-11-15 – 2018-11-18 (×4): 1 g via INTRAVENOUS
  Filled 2018-11-15: qty 10
  Filled 2018-11-15: qty 1
  Filled 2018-11-15 (×2): qty 10

## 2018-11-15 NOTE — Consult Note (Signed)
[facility]    NEPHROLOGY CONSULTATION NOTE  PATIENT ID:  Adrienne Stone, DOB:  March 12, 1946  HPI: The patient is a 73 y.o. year old female patient with a past medical history significant for depression, hyperlipidemia, and hypertension who presented with chills, cough, fatigue, and nausea and vomiting.  She was noted to have influenza a and also was treated for atrial fibrillation with rapid ventricular response.  Her serum sodium on 11/08/18 was 135.  Since then, she had a progressive decrease in her sodium to 121 on 11/14/2018.  This morning, her sodium was 123, prompting renal consultation.  She was on hydrochlorothiazide as an outpatient as needed, however she was taking it daily as an inpatient.  Her last dose was 11/11/2018.   Past Medical History:  Diagnosis Date  . Depression   . Hypercholesteremia   . Hypertension     Past Surgical History:  Procedure Laterality Date  . ABDOMINAL HYSTERECTOMY    . CHOLECYSTECTOMY    . GASTRIC BYPASS    . thumb surgery      History reviewed. No pertinent family history.  Social History   Tobacco Use  . Smoking status: Former Games developer  . Smokeless tobacco: Never Used  Substance Use Topics  . Alcohol use: Yes    Comment: daily  . Drug use: No    REVIEW OF SYSTEMS: General:  no fatigue, no weakness Head:  no headaches Eyes:  no blurred vision ENT:  no sore throat Neck:  no masses CV:  no chest pain, no orthopnea Lungs:  no shortness of breath, positive cough GI: Positive nausea GU:  no dysuria or hematuria Skin:  no rashes or lesions Neuro:  no focal numbness or weakness Psych:  no depression or anxiety    PHYSICAL EXAM:  Vitals:   11/15/18 0940 11/15/18 1500  BP: 133/66 115/60  Pulse: 90 62  Resp:    Temp:  98.4 F (36.9 C)  SpO2:  96%   I/O last 3 completed shifts: In: 1071.2 [P.O.:460; I.V.:611.2] Out: 700 [Urine:700]   General:  AAOx3 NAD HEENT: MMM [place] AT anicteric sclera Neck:  No JVD, no  adenopathy CV:  Heart RRR  Lungs:  L/S with coarse breath sounds bilaterally. Abd:  abd SNT/ND with normal BS GU:  Bladder non-palpable Extremities: +1 bilateral upper and lower extremity edema. Skin:  No skin rash Psych:  normal mood and affect Neuro:  no focal deficits   CURRENT MEDICATIONS:  . azithromycin  500 mg Oral Q24H  . bisacodyl  10 mg Rectal Once  . diltiazem  360 mg Oral Daily  . guaiFENesin-dextromethorphan  10 mL Oral Q6H  . lisinopril  5 mg Oral Daily  . metoprolol tartrate  75 mg Oral BID  . mirtazapine  45 mg Oral QHS  . sodium chloride flush  3 mL Intravenous Q12H  . sodium chloride flush  3 mL Intravenous Q12H  . venlafaxine  50 mg Oral BID WC     HOME MEDICATIONS:  Prior to Admission medications   Medication Sig Start Date End Date Taking? Authorizing Provider  acetaminophen (TYLENOL) 650 MG CR tablet Take 1,300 mg by mouth every 8 (eight) hours as needed for pain.   Yes [provider]  acetaminophen-codeine (TYLENOL #3) 300-30 MG tablet Take 1 tablet by mouth at bedtime. 10/16/14  Yes [provider]  Ascorbic Acid (VITAMIN C) 1000 MG tablet Take 1,000 mg by mouth daily.   Yes [provider]  Calcium Carbonate-Vit  D-Min (CALCIUM 1200 PO) Take by mouth.   Yes [provider]  cloNIDine (CATAPRES) 0.1 MG tablet Take 0.1 mg by mouth 2 (two) times daily. 01/27/18  Yes [provider]  fenofibrate 160 MG tablet Take 160 mg by mouth daily. 07/02/16  Yes [provider]  ferrous sulfate 325 (65 FE) MG tablet Take 325 mg by mouth daily with breakfast.   Yes [provider]  hydrochlorothiazide (MICROZIDE) 12.5 MG capsule Take 12.5 mg by mouth daily as needed (edema).    Yes [provider]  lisinopril (PRINIVIL,ZESTRIL) 40 MG tablet Take 40 mg by mouth daily.   Yes [provider]  loratadine (CLARITIN) 10 MG tablet Take 10 mg by mouth daily.   Yes [provider]  LORazepam  (ATIVAN) 1 MG tablet Take 1 mg by mouth 2 (two) times daily as needed for anxiety. 05/21/17  Yes [provider]  Magnesium 250 MG TABS Take by mouth.   Yes [provider]  metoprolol tartrate (LOPRESSOR) 25 MG tablet Take 25 mg by mouth 2 (two) times daily.   Yes [provider]  Multiple Vitamin (MULTIVITAMIN) capsule Take 1 capsule by mouth daily.   Yes [provider]  multivitamin-lutein (OCUVITE-LUTEIN) CAPS capsule Take 1 capsule by mouth daily.   Yes [provider]  pantoprazole (PROTONIX) 40 MG tablet Take 40 mg by mouth 2 (two) times daily.    Yes [provider]  QUEtiapine (SEROQUEL) 25 MG tablet Take 50 mg by mouth at bedtime. Take 2 tablets (50mg ) at bedtime 11/02/18  Yes [provider]  temazepam (RESTORIL) 30 MG capsule Take 30 mg by mouth at bedtime as needed for sleep.   Yes [provider]  venlafaxine (EFFEXOR) 50 MG tablet Take 50 mg by mouth 2 (two) times daily.   Yes [provider]  escitalopram (LEXAPRO) 10 MG tablet Take 10 mg by mouth daily.    [provider]  mirtazapine (REMERON) 45 MG tablet Take 45 mg by mouth at bedtime.    [provider]       LABS:  CBC Latest Ref Rng & Units 11/15/2018 11/10/2018 11/07/2018  WBC 4.0 - 10.5 K/uL 18.5(H) 7.7 9.3  Hemoglobin 12.0 - 15.0 g/dL 1.6(X9.2(L) 11.9(L) 11.4(L)  Hematocrit 36.0 - 46.0 % 26.8(L) 36.9 36.4  Platelets 150 - 400 K/uL 559(H) 369 221    CMP Latest Ref Rng & Units 11/15/2018 11/15/2018 11/15/2018  Glucose 70 - 99 mg/dL 096(E122(H) 454(U191(H) 981(X113(H)  BUN 8 - 23 mg/dL 91(Y44(H) 78(G42(H) 95(A41(H)  Creatinine 0.44 - 1.00 mg/dL 2.13(Y1.01(H) 8.65(H1.10(H) 8.460.99  Sodium 135 - 145 mmol/L 123(L) 122(L) 124(L)  Potassium 3.5 - 5.1 mmol/L 4.8 4.2 4.0  Chloride 98 - 111 mmol/L 92(L) 89(L) 88(L)  CO2 22 - 32 mmol/L 18(L) 22 23  Calcium 8.9 - 10.3 mg/dL 8.0(L) 8.3(L) 8.1(L)  Total Protein 6.5 - 8.1 g/dL - - -  Total Bilirubin 0.3 - 1.2 mg/dL - - -  Alkaline  Phos 38 - 126 U/L - - -  AST 15 - 41 U/L - - -  ALT 0 - 44 U/L - - -    Lab Results  Component Value Date   CALCIUM 8.0 (L) 11/15/2018   CAION 1.25 11/04/2018       Component Value Date/Time   COLORURINE YELLOW 11/04/2018 1701   APPEARANCEUR CLEAR 11/04/2018 1701   LABSPEC 1.015 11/04/2018 1701   PHURINE 5.0 11/04/2018 1701   GLUCOSEU NEGATIVE 11/04/2018 1701  HGBUR NEGATIVE 11/04/2018 1701   BILIRUBINUR NEGATIVE 11/04/2018 1701   KETONESUR NEGATIVE 11/04/2018 1701   PROTEINUR NEGATIVE 11/04/2018 1701   NITRITE NEGATIVE 11/04/2018 1701   LEUKOCYTESUR NEGATIVE 11/04/2018 1701      Component Value Date/Time   PHART 7.355 11/04/2018 2148   PCO2ART 39.4 11/04/2018 2148   PO2ART 141.0 (H) 11/04/2018 2148   HCO3 21.9 11/04/2018 2148   TCO2 23 11/04/2018 2148   ACIDBASEDEF 3.0 (H) 11/04/2018 2148   O2SAT 99.0 11/04/2018 2148       Component Value Date/Time   IRON 12 (L) 11/05/2018 0400   TIBC 311 11/05/2018 0400   FERRITIN 136 11/05/2018 0400   IRONPCTSAT 4 (L) 11/05/2018 0400       ASSESSMENT/PLAN:     Problem List Items Addressed This Visit    None    Visit Diagnoses    Cough    -  Primary   Influenza-like illness       Tachypnea       Relevant Orders   DG CHEST PORT 1 VIEW (Completed)      1.  Hyponatremia.  Urine sodium 14 with a urine osmolality of 611, arguing against SIADH.  Urine awesome has continued to be elevated on 3 occasions.  We will add furosemide to help excrete additional free water.  We will also add salt tablets.  Suspect this could still be related to hydrochlorothiazide given its relatively long half-life.  2.  Influenza.  Per primary team.  3.  Atrial fibrillation.  Rate control HAS improved.   [address]Milta Croson MeromFinnigan, DO, [place]

## 2018-11-15 NOTE — Progress Notes (Signed)
PROGRESS NOTE    Adrienne Stone  MWN:[id number] DOB: 09/11/1946 DOA: 11/04/2018 PCP: [name], PA-C   Brief Narrative: Adrienne Stone is a 73 y.o. female who presented with chills, cough, fatigue, nausea vomiting. She does have significant past medical history for hypertension, depression and obesity. She presented secondary to ILI found to have influenza A infection and developed atrial fibrillation with RVR. Rate stable.   Assessment & Plan:   Principal Problem:   Influenza A Active Problems:   Acute respiratory failure with hypoxia (HCC)   Acute respiratory distress   Hypertensive urgency   Normocytic anemia   Atrial fibrillation with RVR (HCC)   Hyponatremia Patient takes hydrochlorothiazide as an outpatient "as needed." Likely secondary to hydrochlorothiazide resumed inpatient. Initial improvement then started worsening. NS IV fluids started with slow improvement -Repeat urine sodium/osmolality -Continue IV fluids, may increase rate to 100 ml/hr -Repeat BMP q6 hours after starting intervention  Influenza A infection Completed Tamiflu course  Atrial fibrillation with RVR New onset. Cardiology on board. Rate controlled. -Cardiology recommendations: Metoprolol, Cardizem, outpatient follow-up  Acute respiratory failure with hypoxia Secondary to influenza infection. CT scan with infiltrates likely related to influenza. Patient currently on room air.  Essential hypertension Controlled. -Continue medications per cardiology recommendations  Depression -Continue temazepam, lorazepam, mirtazapine, venlafaxine  Chronic multifactorial anemia Stable  Hypokalemia Supplementation given.  Tachypnea Some mild shortness of breath. Chest x-ray with mild worsening infiltrate vs scarring. No evidence of worsening infection clinically. Possibly related to anemia, if present. CBC is pending.  DVT prophylaxis: Lovenox Code Status:   Code Status: DNR Family  Communication: None at bedside Disposition Plan: Discharge to SNF pending cardiology recommendations and improvement of sodium   Consultants:   Cardiology  Procedures:   Transthoracic Echocardiogram (11/09/2018) IMPRESSIONS    1. The left ventricle has normal systolic function of 60-65%. The cavity size is normal. There is moderate concentric left ventricular wall hypertrophy. . The left ventricular diastology could not be evaluatedsecondary to atrial fibrillation.  2. Severely dilated left atrial size.  3. There is moderate mitral annular calcification present. Mitral regurgitation is trivial by color flow Doppler.  4. No atrial level shunt detected by color flow Doppler.  Antimicrobials:  Ceftriaxone (1/29)  Azithromycin (1/29)  Tamiflu (1/30>>2/3)    Subjective: Patient reports continued lightheadedness.  She has some mild shortness of breath as well.  Nausea has improved.  Objective: Vitals:   11/14/18 2026 11/14/18 2300 11/15/18 0606 11/15/18 0940  BP: 135/78  131/62 133/66  Pulse: 72 100 69 90  Resp: (!) 28  18   Temp: (!) 97.4 F (36.3 C)  98.3 F (36.8 C)   TempSrc: Oral  Oral   SpO2: 91%  94%   Weight:   110.9 kg   Height:   5\' 3"  (1.6 m)     Intake/Output Summary (Last 24 hours) at 11/15/2018 1104 Last data filed at 11/15/2018 0425 Gross per 24 hour  Intake 848.2 ml  Output 300 ml  Net 548.2 ml   Filed Weights   11/13/18 0500 11/14/18 0525 11/15/18 0606  Weight: 108.6 kg 109 kg 110.9 kg    Examination:  General exam: Appears calm and comfortable Respiratory system: Diminished breath sounds. Respiratory effort normal. Cardiovascular system: S1 & S2 heard, RRR. No murmurs, rubs, gallops or clicks. Gastrointestinal system: Abdomen is nondistended, soft and nontender. No organomegaly or masses felt. Normal bowel sounds heard. Central nervous system: Alert and oriented. No focal neurological deficits. Extremities: No edema.  No calf tenderness Skin:  No cyanosis. No rashes Psychiatry: Judgement and insight appear normal. Depressed mood. Flat affect     Data Reviewed: I have personally reviewed following labs and imaging studies  CBC: Recent Labs  Lab 11/10/18 0400  WBC 7.7  NEUTROABS 5.9  HGB 11.9*  HCT 36.9  MCV 88.3  PLT 369   Basic Metabolic Panel: Recent Labs  Lab 11/13/18 0434 11/14/18 0446 11/14/18 1606 11/14/18 2241 11/15/18 0422  NA 126* 122* 121* 123* 124*  K 3.8 4.1 4.6 4.0 4.0  CL 86* 84* 87* 85* 88*  CO2 25 23 25 24 23   GLUCOSE 125* 113* 114* 133* 113*  BUN 23 26* 39* 45* 41*  CREATININE 0.99 0.90 0.98 1.13* 0.99  CALCIUM 9.0 8.9 8.8* 8.3* 8.1*   GFR: Estimated Creatinine Clearance: 59.7 mL/min (by C-G formula based on SCr of 0.99 mg/dL). Liver Function Tests: No results for input(s): AST, ALT, ALKPHOS, BILITOT, PROT, ALBUMIN in the last 168 hours. No results for input(s): LIPASE, AMYLASE in the last 168 hours. No results for input(s): AMMONIA in the last 168 hours. Coagulation Profile: No results for input(s): INR, PROTIME in the last 168 hours. Cardiac Enzymes: No results for input(s): CKTOTAL, CKMB, CKMBINDEX, TROPONINI in the last 168 hours. BNP (last 3 results) No results for input(s): PROBNP in the last 8760 hours. HbA1C: No results for input(s): HGBA1C in the last 72 hours. CBG: Recent Labs  Lab 11/10/18 0757 11/11/18 0753 11/12/18 0741 11/13/18 0837 11/14/18 0737  GLUCAP 130* 110* 126* 198* 130*   Lipid Profile: No results for input(s): CHOL, HDL, LDLCALC, TRIG, CHOLHDL, LDLDIRECT in the last 72 hours. Thyroid Function Tests: No results for input(s): TSH, T4TOTAL, FREET4, T3FREE, THYROIDAB in the last 72 hours. Anemia Panel: No results for input(s): VITAMINB12, FOLATE, FERRITIN, TIBC, IRON, RETICCTPCT in the last 72 hours. Sepsis Labs: No results for input(s): PROCALCITON, LATICACIDVEN in the last 168 hours.  Recent Results (from the past 240 hour(s))  Culture,  sputum-assessment     Status: None   Collection Time: 11/05/18  8:18 PM  Result Value Ref Range Status   Specimen Description EXPECTORATED SPUTUM  Final   Special Requests NONE  Final   Sputum evaluation   Final    THIS SPECIMEN IS ACCEPTABLE FOR SPUTUM CULTURE Performed at [facility], [address] [place], [place] [address]    Report Status 11/11/2018 FINAL  Final  Culture, respiratory     Status: None   Collection Time: 11/05/18  8:18 PM  Result Value Ref Range Status   Specimen Description EXPECTORATED SPUTUM  Final   Special Requests NONE Reflexed from [id number]  Final   Gram Stain   Final    MODERATE WBC PRESENT,BOTH PMN AND MONONUCLEAR ABUNDANT GRAM VARIABLE ROD ABUNDANT GRAM POSITIVE COCCI RARE YEAST RARE SQUAMOUS EPITHELIAL CELLS PRESENT Performed at [facility], [address] [place], [place] [address]    Culture MODERATE CANDIDA ALBICANS  Final   Report Status 11/14/2018 FINAL  Final  MRSA PCR Screening     Status: None   Collection Time: 11/08/18 11:04 AM  Result Value Ref Range Status   MRSA by PCR NEGATIVE NEGATIVE Final    Comment:        The GeneXpert MRSA Assay (FDA approved for NASAL specimens only), is one component of a comprehensive MRSA colonization surveillance program. It is not intended to diagnose MRSA infection nor to guide or monitor treatment for MRSA infections. Performed  at [facility], [address] [place], [place] [address]          Radiology Studies: Dg Chest [place]  Result Date: 11/14/2018 CLINICAL DATA:  73 year old female with shortness of breath. Recently admitted for flu and pneumonia EXAM: PORTABLE CHEST 1 VIEW COMPARISON:  Prior chest x-ray 11/04/2018 FINDINGS: Cardiac and mediastinal contours are within normal limits. 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 Overall, inspiratory volumes are slightly lower than previously seen. There is increased patchy airspace  opacity in the bilateral lung apices worse on the right than the left. Chronic bronchitic changes and interstitial prominence are similar compared to prior. No acute osseous abnormality. IMPRESSION: 1. Slightly increased patchy airspace opacities in the right greater than left lung apices. There are areas of airspace infiltrate on the prior CT scan from 11/04/2018. This may represent progression of multifocal pneumonia versus residual scarring. 2. Slightly lower inspiratory volumes compared to prior imaging. 3. 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 Electronically Signed   By: Malachy MoanHeath  McCullough M.D.   On: 11/14/2018 11:39        Scheduled Meds: . bisacodyl  10 mg Rectal Once  . diltiazem  360 mg Oral Daily  . guaiFENesin-dextromethorphan  10 mL Oral Q6H  . lisinopril  5 mg Oral Daily  . metoprolol tartrate  75 mg Oral BID  . mirtazapine  45 mg Oral QHS  . sodium chloride flush  3 mL Intravenous Q12H  . sodium chloride flush  3 mL Intravenous Q12H  . venlafaxine  50 mg Oral BID WC   Continuous Infusions: . sodium chloride    . sodium chloride 75 mL/hr at 11/15/18 [id number]     LOS: 10 days     Jacquelin Hawkingalph Carl Bleecker, MD [facility] 11/15/2018, 11:04 AM  If 7PM-7AM, please contact night-coverage [email or web address]

## 2018-11-15 NOTE — Social Work (Signed)
This is a late entry note from meeting with patient and sister, [name], on 11/13/18. Patient and sister discussed SNF offers and have changed their choice to [facility]. CSW did confirm bed at [facility] and switched [place] process to [facility]. Awaiting [employer] approval and then will assist with discharge to [facility].  Abigail Butts, LCSW [phone]

## 2018-11-16 DIAGNOSIS — D62 Acute posthemorrhagic anemia: Secondary | ICD-10-CM

## 2018-11-16 DIAGNOSIS — K921 Melena: Secondary | ICD-10-CM

## 2018-11-16 LAB — CBC
HCT: 23.9 % — ABNORMAL LOW (ref 36.0–46.0)
HCT: 20.9 % — ABNORMAL LOW (ref 36.0–46.0)
HCT: 24.9 % — ABNORMAL LOW (ref 36.0–46.0)
HEMOGLOBIN: 7.8 g/dL — AB (ref 12.0–15.0)
Hemoglobin: 7.1 g/dL — ABNORMAL LOW (ref 12.0–15.0)
Hemoglobin: 8.4 g/dL — ABNORMAL LOW (ref 12.0–15.0)
MCH: 28.8 pg (ref 26.0–34.0)
MCH: 30.2 pg (ref 26.0–34.0)
MCH: 29.9 pg (ref 26.0–34.0)
MCHC: 32.6 g/dL (ref 30.0–36.0)
MCHC: 34 g/dL (ref 30.0–36.0)
MCHC: 33.7 g/dL (ref 30.0–36.0)
MCV: 88.2 fL (ref 80.0–100.0)
MCV: 88.9 fL (ref 80.0–100.0)
MCV: 88.6 fL (ref 80.0–100.0)
NRBC: 0 % (ref 0.0–0.2)
NRBC: 0 % (ref 0.0–0.2)
PLATELETS: 499 10*3/uL — AB (ref 150–400)
Platelets: 517 10*3/uL — ABNORMAL HIGH (ref 150–400)
Platelets: 529 10*3/uL — ABNORMAL HIGH (ref 150–400)
RBC: 2.71 MIL/uL — ABNORMAL LOW (ref 3.87–5.11)
RBC: 2.35 MIL/uL — ABNORMAL LOW (ref 3.87–5.11)
RBC: 2.81 MIL/uL — ABNORMAL LOW (ref 3.87–5.11)
RDW: 12.8 % (ref 11.5–15.5)
RDW: 13 % (ref 11.5–15.5)
RDW: 13.9 % (ref 11.5–15.5)
WBC: 15.7 10*3/uL — ABNORMAL HIGH (ref 4.0–10.5)
WBC: 15.2 10*3/uL — ABNORMAL HIGH (ref 4.0–10.5)
WBC: 15.3 10*3/uL — ABNORMAL HIGH (ref 4.0–10.5)
nRBC: 0 % (ref 0.0–0.2)

## 2018-11-16 LAB — BASIC METABOLIC PANEL
Anion gap: 12 (ref 5–15)
BUN: 39 mg/dL — ABNORMAL HIGH (ref 8–23)
CHLORIDE: 97 mmol/L — AB (ref 98–111)
CO2: 20 mmol/L — ABNORMAL LOW (ref 22–32)
CREATININE: 1.07 mg/dL — AB (ref 0.44–1.00)
Calcium: 8 mg/dL — ABNORMAL LOW (ref 8.9–10.3)
GFR calc Af Amer: 59 mL/min — ABNORMAL LOW (ref >60)
GFR calc non Af Amer: 51 mL/min — ABNORMAL LOW (ref >60)
Glucose, Bld: 116 mg/dL — ABNORMAL HIGH (ref 70–99)
Potassium: 4.8 mmol/L (ref 3.5–5.1)
Sodium: 129 mmol/L — ABNORMAL LOW (ref 135–145)

## 2018-11-16 LAB — PREPARE RBC (CROSSMATCH)

## 2018-11-16 LAB — GLUCOSE, CAPILLARY: Glucose-Capillary: 113 mg/dL — ABNORMAL HIGH (ref 70–99)

## 2018-11-16 LAB — ABO/RH: ABO/RH(D): O NEG

## 2018-11-16 LAB — PROCALCITONIN: Procalcitonin: 0.11 ng/mL

## 2018-11-16 MED ORDER — GUAIFENESIN ER 600 MG PO TB12
1200.0000 mg | ORAL_TABLET | Freq: Two times a day (BID) | ORAL | Status: DC
  Administered 2018-11-16 – 2018-11-19 (×7): 1200 mg via ORAL
  Filled 2018-11-16 (×7): qty 2

## 2018-11-16 MED ORDER — PANTOPRAZOLE SODIUM 40 MG PO TBEC
40.0000 mg | DELAYED_RELEASE_TABLET | Freq: Two times a day (BID) | ORAL | Status: DC
  Administered 2018-11-16: 40 mg via ORAL
  Filled 2018-11-16: qty 1

## 2018-11-16 MED ORDER — SODIUM CHLORIDE 0.9% IV SOLUTION
Freq: Once | INTRAVENOUS | Status: AC
  Administered 2018-11-16: 15:00:00 via INTRAVENOUS

## 2018-11-16 MED ORDER — SODIUM CHLORIDE 0.9 % IV SOLN: INTRAVENOUS | Status: DC

## 2018-11-16 MED ORDER — PANTOPRAZOLE SODIUM 40 MG IV SOLR
40.0000 mg | Freq: Two times a day (BID) | INTRAVENOUS | Status: DC
  Administered 2018-11-17 – 2018-11-19 (×5): 40 mg via INTRAVENOUS
  Filled 2018-11-16 (×6): qty 40

## 2018-11-16 NOTE — Social Work (Signed)
[facility] has received [employer] approval for patient to admit to their SNF. Auth valid 3 days. Will need to start new auth if patient does not discharge in time. CSW to follow for medical readiness and support with discharge planning.  Abigail Butts, LCSW [phone]

## 2018-11-16 NOTE — Consult Note (Signed)
Consultation  Referring Provider:   Dr. Caleb Popp Primary Care Physician:  [name], PA-C Primary Gastroenterologist: Gentry Fitz        Reason for Consultation: Anemia, GI bleed             HPI:   Adrienne Stone is a 73 y.o. female with a past medical history as listed below, who presented to the ER on 11/15/2018 with a "flulike illness".  We are consulted in regards to a hemoglobin of 10.6.    Today, the patient explains that she was feeling well until the evening of 11/03/2018 when she developed acute onset of chills with fatigue, aches including headache, cough, nausea with a few episodes of nonbloody vomiting and shortness of breath.  Tells me that she was starting to feel a little bit better, but Friday, 11/13/2018 she started with back tarry stool.  Describes 1 bowel movement that day, two the next and 2 more yesterday, the last was "very large and very smelly".  Describes she had a similar occurrence back in 2013/14 and eventually had a double balloon enteroscopy at [facility] which showed some "scar tissue that looked like it had been recently abraded" in the jejunum, per the patient.  She received 10 units of blood that stay and they "never really saw anything bleeding".  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  Tells me she had some complications from this with what sounds like dehiscence of the wound.  Associated symptoms include "terrible nausea" and generalized abdominal discomfort.    Denies weight loss or symptoms that awaken her from sleep.  ED course: Seen at [facility], febrile 38.1 Celsius, saturating mid 90s on room air, tachypneic in the 30s, tachycardic in the 120s and a blood pressure of 237/100, EKG was sinus tach and rate of 124 with PACs, chest x-ray negative for cardiopulmonary disease, can panel with a bicarbonate of 19, creatinine 1.03 (0.79 November), hemoglobin 10.6 (12.7 in November), influenza A was positive, lactic acid  normal and troponin undetectable; CTA chest was negative for PE but notable for patchy bilateral groundglass opacities in the upper lobes; received Rocephin, azithromycin, prednisone, DuoNeb and Lopressor, placed on BiPAP, tachycardia resolved, blood pressure improved  GI history: 07/05/2013 double-balloon enteroscopy-antegrade: Done for GI bleed after gastric bypass; 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 Depression   . Hypercholesteremia   . Hypertension     Past Surgical History:  Procedure Laterality Date  . ABDOMINAL HYSTERECTOMY    . CHOLECYSTECTOMY    . GASTRIC BYPASS    . thumb surgery      History reviewed. No pertinent family history.   Social History   Tobacco Use  . Smoking status: Former Games developer  . Smokeless tobacco: Never Used  Substance Use Topics  . Alcohol use: Yes    Comment: daily  . Drug use: No    Prior to Admission medications   Medication Sig Start Date End Date Taking? Authorizing Provider  acetaminophen (TYLENOL) 650 MG CR tablet Take 1,300 mg by mouth every 8 (eight) hours as needed for pain.   Yes [provider]  acetaminophen-codeine (TYLENOL #3) 300-30 MG tablet Take 1 tablet by mouth at bedtime. 10/16/14  Yes [provider]  Ascorbic Acid (VITAMIN C) 1000 MG tablet Take 1,000 mg by  mouth daily.   Yes [provider]  Calcium Carbonate-Vit D-Min (CALCIUM 1200 PO) Take by mouth.   Yes [provider]  cloNIDine (CATAPRES) 0.1 MG tablet Take 0.1 mg by mouth 2 (two) times daily. 01/27/18  Yes [provider]  fenofibrate 160 MG tablet Take 160 mg by mouth daily. 07/02/16  Yes [provider]  ferrous sulfate 325 (65 FE) MG tablet Take 325 mg by mouth daily with breakfast.   Yes [provider]  hydrochlorothiazide (MICROZIDE)  12.5 MG capsule Take 12.5 mg by mouth daily as needed (edema).    Yes [provider]  lisinopril (PRINIVIL,ZESTRIL) 40 MG tablet Take 40 mg by mouth daily.   Yes [provider]  loratadine (CLARITIN) 10 MG tablet Take 10 mg by mouth daily.   Yes [provider]  LORazepam (ATIVAN) 1 MG tablet Take 1 mg by mouth 2 (two) times daily as needed for anxiety. 05/21/17  Yes [provider]  Magnesium 250 MG TABS Take by mouth.   Yes [provider]  metoprolol tartrate (LOPRESSOR) 25 MG tablet Take 25 mg by mouth 2 (two) times daily.   Yes [provider]  Multiple Vitamin (MULTIVITAMIN) capsule Take 1 capsule by mouth daily.   Yes [provider]  multivitamin-lutein (OCUVITE-LUTEIN) CAPS capsule Take 1 capsule by mouth daily.   Yes [provider]  pantoprazole (PROTONIX) 40 MG tablet Take 40 mg by mouth 2 (two) times daily.    Yes [provider]  QUEtiapine (SEROQUEL) 25 MG tablet Take 50 mg by mouth at bedtime. Take 2 tablets (50mg ) at bedtime 11/02/18  Yes [provider]  temazepam (RESTORIL) 30 MG capsule Take 30 mg by mouth at bedtime as needed for sleep.   Yes [provider]  venlafaxine (EFFEXOR) 50 MG tablet Take 50 mg by mouth 2 (two) times daily.   Yes [provider]  escitalopram (LEXAPRO) 10 MG tablet Take 10 mg by mouth daily.    [provider]  mirtazapine (REMERON) 45 MG tablet Take 45 mg by mouth at bedtime.    [provider]    Current Facility-Administered Medications  Medication Dose Route Frequency Provider Last Rate Last Dose  . 0.9 %  sodium chloride infusion  250 mL Intravenous PRN Opyd, Lavone Neri, MD      . 0.9 %  sodium chloride infusion   Intravenous Continuous Narda Bonds, MD   Stopped at 11/16/18 1437  . acetaminophen (TYLENOL) tablet 650 mg  650 mg Oral Q6H PRN Opyd, Lavone Neri, MD   650 mg at 11/16/18 0840   Or  . acetaminophen (TYLENOL)  suppository 650 mg  650 mg Rectal Q6H PRN Opyd, Lavone Neri, MD      . azithromycin (ZITHROMAX) tablet 500 mg  500 mg Oral Q24H Narda Bonds, MD   500 mg at 11/15/18 1713  . benzonatate (TESSALON) capsule 200 mg  200 mg Oral TID PRN Coralie Keens, MD   200 mg at 11/15/18 2229  . bisacodyl (DULCOLAX) EC tablet 5 mg  5 mg Oral Daily PRN Audrea Muscat T, NP   5 mg at 11/13/18 0826  . bisacodyl (DULCOLAX) suppository 10 mg  10 mg Rectal Once Narda Bonds, MD      . cefTRIAXone (ROCEPHIN) 1 g in sodium chloride 0.9 % 100 mL IVPB  1 g Intravenous Q24H Narda Bonds, MD   Stopped at 11/15/18 1749  . diltiazem (CARDIZEM CD)  24 hr capsule 360 mg  360 mg Oral Daily Wendall Stade, MD   360 mg at 11/16/18 1128  . furosemide (LASIX) tablet 20 mg  20 mg Oral Daily Finnigan, Nancy A, DO   20 mg at 11/16/18 0841  . guaiFENesin (MUCINEX) 12 hr tablet 1,200 mg  1,200 mg Oral BID Narda Bonds, MD   1,200 mg at 11/16/18 1128  . hydrALAZINE (APRESOLINE) injection 10 mg  10 mg Intravenous Q4H PRN Opyd, Lavone Neri, MD   10 mg at 11/11/18 2305  . ipratropium-albuterol (DUONEB) 0.5-2.5 (3) MG/3ML nebulizer solution 3 mL  3 mL Nebulization Q4H PRN Arrien, York Ram, MD   3 mL at 11/07/18 1819  . lisinopril (PRINIVIL,ZESTRIL) tablet 5 mg  5 mg Oral Daily Arrien, York Ram, MD   5 mg at 11/16/18 0841  . LORazepam (ATIVAN) tablet 1 mg  1 mg Oral Q8H PRN Arrien, York Ram, MD   1 mg at 11/14/18 0121  . metoprolol tartrate (LOPRESSOR) tablet 75 mg  75 mg Oral BID Coralie Keens, MD   75 mg at 11/16/18 0840  . mirtazapine (REMERON) tablet 45 mg  45 mg Oral QHS Arrien, York Ram, MD   45 mg at 11/15/18 2220  . morphine 4 MG/ML injection 3 mg  3 mg Intravenous Q4H PRN Opyd, Lavone Neri, MD      . ondansetron (ZOFRAN) injection 4 mg  4 mg Intravenous Q4H PRN Arrien, York Ram, MD   4 mg at 11/14/18 2002  . pantoprazole (PROTONIX) EC tablet 40 mg  40 mg Oral BID Narda Bonds,  MD   40 mg at 11/16/18 1437  . polyethylene glycol (MIRALAX / GLYCOLAX) packet 17 g  17 g Oral Daily PRN Opyd, Lavone Neri, MD   17 g at 11/13/18 0826  . sodium chloride flush (NS) 0.9 % injection 3 mL  3 mL Intravenous Q12H Opyd, Lavone Neri, MD   3 mL at 11/15/18 0947  . sodium chloride flush (NS) 0.9 % injection 3 mL  3 mL Intravenous Q12H Opyd, Lavone Neri, MD   3 mL at 11/15/18 0425  . sodium chloride flush (NS) 0.9 % injection 3 mL  3 mL Intravenous PRN Opyd, Lavone Neri, MD      . sodium chloride tablet 2 g  2 g Oral BID WC Finnigan, Nancy A, DO   1 g at 11/16/18 1129  . temazepam (RESTORIL) capsule 30 mg  30 mg Oral QHS PRN Arrien, York Ram, MD   30 mg at 11/15/18 2221  . traMADol (ULTRAM) tablet 50 mg  50 mg Oral Q6H PRN Opyd, Lavone Neri, MD   50 mg at 11/16/18 1424  . venlafaxine (EFFEXOR) tablet 50 mg  50 mg Oral BID WC Arrien, York Ram, MD   50 mg at 11/16/18 0840    Allergies as of 11/04/2018 - Review Complete 11/04/2018  Allergen Reaction Noted  . Demerol [meperidine] Rash 03/05/2017  . Penicillins Rash 03/05/2017  . Prozac [fluoxetine hcl] Palpitations 03/05/2017  . Serzone [nefazodone] Rash 03/05/2017  . Sulfa antibiotics Rash 03/05/2017     Review of Systems:    Constitutional: No weight loss, fever or chills Skin: No rash  Cardiovascular: No chest pain Respiratory: No SOB Gastrointestinal: See HPI and otherwise negative Genitourinary: No dysuria Neurological: No headache Musculoskeletal: No new muscle or joint pain Hematologic: No bruising Psychiatric: No history of depression or anxiety    Physical Exam:  Vital signs in last 24  hours: Temp:  [97.8 F (36.6 C)-98.5 F (36.9 C)] 97.8 F (36.6 C) (02/10 1430) Pulse Rate:  [62-88] 76 (02/10 1430) Resp:  [18] 18 (02/10 1430) BP: (115-145)/(52-72) 120/57 (02/10 1430) SpO2:  [93 %-99 %] 99 % (02/10 1430) Weight:  [114.9 kg] 114.9 kg (02/10 0528) Last BM Date: 11/15/18 General:   Pleasant obese Caucasian  female appears to be in NAD, Well developed, Well nourished, alert and cooperative Head:  Normocephalic and atraumatic. Eyes:   PEERL, EOMI. No icterus. Conjunctiva pink. Ears:  Normal auditory acuity. Neck:  Supple Throat: Oral cavity and pharynx without inflammation, swelling or lesion.  Lungs: Respirations even and unlabored. Lungs clear to auscultation bilaterally.   No wheezes, crackles, or rhonchi.  Heart: Normal S1, S2. No MRG. Regular rate and rhythm. No peripheral edema, cyanosis or pallor.  Abdomen:  Soft, nondistended, mild generalized ttp. No rebound or guarding. Normal bowel sounds. No appreciable masses or hepatomegaly. Rectal:  Not performed.  Msk:  Symmetrical without gross deformities. Peripheral pulses intact.  Extremities:  Without edema, no deformity or joint abnormality.  Neurologic:  Alert and  oriented x4;  grossly normal neurologically.  Skin:   Dry and intact without significant lesions or rashes. Psychiatric: Demonstrates good judgement and reason without abnormal affect or behaviors.   LAB RESULTS: Recent Labs    11/15/18 1240 11/16/18 0621 11/16/18 1110  WBC 18.5* 15.7* 15.2*  HGB 9.2* 7.8* 7.1*  HCT 26.8* 23.9* 20.9*  PLT 559* 517* 529*   BMET Recent Labs    11/15/18 1016 11/15/18 1615 11/16/18 0706  NA 122* 123* 129*  K 4.2 4.8 4.8  CL 89* 92* 97*  CO2 22 18* 20*  GLUCOSE 191* 122* 116*  BUN 42* 44* 39*  CREATININE 1.10* 1.01* 1.07*  CALCIUM 8.3* 8.0* 8.0*    Impression / Plan:   Impression: 1. Anemia: Hemoglobin 10.6 on admission(down from 12.7 in November), now 7.1, melena with positive Hemoccult, 5 episodes of melena reported over the past 36 hours, last yesterday, history of similar symptoms in 2013 with lesion found of the jejunum per patient, also history of gastric bypass in 2000; consider ulcer versus other upper GI source 2.  Influenza A: With acute respiratory failure and hypoxia, stable now 3.  Hyponatremia 4.  A. fib with RVR:  New onset, cardiology following, rate controlled  Plan: 1.  Continue Protonix twice daily 2.  Continue to monitor hemoglobin with transfusion as needed less than 7 3.  We will plan for EGD tomorrow with Dr. Adela LankArmbruster.  Did discuss risks, benefits, limitations and alternatives and patient agrees to proceed. 4.  Patient will be on a clear liquid diet today and n.p.o. after midnight 5.  Please await any further recommendations from Dr. Adela LankArmbruster later today.  Thank you for your kind consultation, we will continue to follow.  Violet BaldyJennifer Lynne [facility]  11/16/2018, 2:46 PM

## 2018-11-16 NOTE — Progress Notes (Signed)
PROGRESS NOTE    Adrienne Stone  WUJ:[id number] DOB: 24-Jan-1946 DOA: 11/04/2018 PCP: [name], PA-C   Brief Narrative: Adrienne Stone is a 73 y.o. female who presented with chills, cough, fatigue, nausea vomiting. She does have significant past medical history for hypertension, depression and obesity. She presented secondary to ILI found to have influenza A infection and developed atrial fibrillation with RVR. Rate stable.   Assessment & Plan:   Principal Problem:   Influenza A Active Problems:   Acute respiratory failure with hypoxia (HCC)   Acute respiratory distress   Hypertensive urgency   Normocytic anemia   Atrial fibrillation with RVR (HCC)   Hyponatremia Patient takes hydrochlorothiazide as an outpatient "as needed." Likely secondary to hydrochlorothiazide resumed inpatient. Initial improvement then started worsening. NS IV fluids started with slow improvement. Nephrology consulted -Nephrology recommendations: IV fluids, salt tabs, lasix  Influenza A infection Completed Tamiflu course  Atrial fibrillation with RVR New onset. Cardiology on board. Rate controlled. -Cardiology recommendations: Metoprolol, Cardizem, outpatient follow-up  Acute respiratory failure with hypoxia Secondary to influenza infection. CT scan with infiltrates likely related to influenza. Patient currently on room air.  Essential hypertension Controlled. -Continue medications per cardiology recommendations  Depression -Continue temazepam, lorazepam, mirtazapine, venlafaxine  Hypokalemia Supplementation given.  Multifocal pneumonia Productive cough, elevated WBC, worsened chest x-ray. Concern for bacterial pneumonia. MRSA pcr negative. -Continue Ceftriaxone and azithromycin  Acute blood loss anemia Acute on chronic anemia Likely secondary to GI bleed. History in the past with negative workup. Associated melena with positive hemoccult -CBC q 6 hours -Protonix BID -GI  consult  DVT prophylaxis: Lovenox Code Status:   Code Status: DNR Family Communication: None at bedside Disposition Plan: Discharge to SNF pending cardiology recommendations and improvement of sodium in addition to anemia workup   Consultants:   Cardiology  Nephrology  Gastroenterology  Procedures:   Transthoracic Echocardiogram (11/09/2018) IMPRESSIONS    1. The left ventricle has normal systolic function of 60-65%. The cavity size is normal. There is moderate concentric left ventricular wall hypertrophy. . The left ventricular diastology could not be evaluatedsecondary to atrial fibrillation.  2. Severely dilated left atrial size.  3. There is moderate mitral annular calcification present. Mitral regurgitation is trivial by color flow Doppler.  4. No atrial level shunt detected by color flow Doppler.  Antimicrobials:  Ceftriaxone (1/29)  Azithromycin (1/29)  Tamiflu (1/30>>2/3)    Subjective: Lightheadedness. Dyspnea improved.  Objective: Vitals:   11/15/18 2222 11/16/18 0528 11/16/18 0840 11/16/18 1128  BP: (!) 128/52 (!) 143/72 (!) 143/53 (!) 145/52  Pulse: 80 80 88   Resp:  18    Temp:  98.5 F (36.9 C)    TempSrc:  Oral    SpO2:  96%    Weight:  114.9 kg    Height:        Intake/Output Summary (Last 24 hours) at 11/16/2018 1238 Last data filed at 11/16/2018 0908 Gross per 24 hour  Intake 2309.69 ml  Output -  Net 2309.69 ml   Filed Weights   11/14/18 0525 11/15/18 0606 11/16/18 0528  Weight: 109 kg 110.9 kg 114.9 kg    Examination:  General exam: Appears calm and comfortable  Respiratory system: Diminished breath sounds. Respiratory effort normal. Cardiovascular system: S1 & S2 heard, RRR. No murmurs, rubs, gallops or clicks. Gastrointestinal system: Abdomen is nondistended, soft and nontender. No organomegaly or masses felt. Normal bowel sounds heard. Central nervous system: Alert and oriented. No focal neurological deficits. Extremities:  No  edema. No calf tenderness Skin: No cyanosis. No rashes Psychiatry: Judgement and insight appear normal. Mood & affect appropriate.     Data Reviewed: I have personally reviewed following labs and imaging studies  CBC: Recent Labs  Lab 11/10/18 0400 11/15/18 1240 11/16/18 0621 11/16/18 1110  WBC 7.7 18.5* 15.7* 15.2*  NEUTROABS 5.9  --   --   --   HGB 11.9* 9.2* 7.8* 7.1*  HCT 36.9 26.8* 23.9* 20.9*  MCV 88.3 86.7 88.2 88.9  PLT 369 559* 517* 529*   Basic Metabolic Panel: Recent Labs  Lab 11/14/18 2241 11/15/18 0422 11/15/18 1016 11/15/18 1615 11/16/18 0706  NA 123* 124* 122* 123* 129*  K 4.0 4.0 4.2 4.8 4.8  CL 85* 88* 89* 92* 97*  CO2 24 23 22  18* 20*  GLUCOSE 133* 113* 191* 122* 116*  BUN 45* 41* 42* 44* 39*  CREATININE 1.13* 0.99 1.10* 1.01* 1.07*  CALCIUM 8.3* 8.1* 8.3* 8.0* 8.0*   GFR: Estimated Creatinine Clearance: 56.4 mL/min (A) (by C-G formula based on SCr of 1.07 mg/dL (H)). Liver Function Tests: No results for input(s): AST, ALT, ALKPHOS, BILITOT, PROT, ALBUMIN in the last 168 hours. No results for input(s): LIPASE, AMYLASE in the last 168 hours. No results for input(s): AMMONIA in the last 168 hours. Coagulation Profile: No results for input(s): INR, PROTIME in the last 168 hours. Cardiac Enzymes: No results for input(s): CKTOTAL, CKMB, CKMBINDEX, TROPONINI in the last 168 hours. BNP (last 3 results) No results for input(s): PROBNP in the last 8760 hours. HbA1C: No results for input(s): HGBA1C in the last 72 hours. CBG: Recent Labs  Lab 11/11/18 0753 11/12/18 0741 11/13/18 0837 11/14/18 0737 11/15/18 0748  GLUCAP 110* 126* 198* 130* 113*   Lipid Profile: No results for input(s): CHOL, HDL, LDLCALC, TRIG, CHOLHDL, LDLDIRECT in the last 72 hours. Thyroid Function Tests: No results for input(s): TSH, T4TOTAL, FREET4, T3FREE, THYROIDAB in the last 72 hours. Anemia Panel: No results for input(s): VITAMINB12, FOLATE, FERRITIN, TIBC, IRON,  RETICCTPCT in the last 72 hours. Sepsis Labs: Recent Labs  Lab 11/15/18 1016 11/16/18 0442  PROCALCITON 0.10 0.11    Recent Results (from the past 240 hour(s))  MRSA PCR Screening     Status: None   Collection Time: 11/08/18 11:04 AM  Result Value Ref Range Status   MRSA by PCR NEGATIVE NEGATIVE Final    Comment:        The GeneXpert MRSA Assay (FDA approved for NASAL specimens only), is one component of a comprehensive MRSA colonization surveillance program. It is not intended to diagnose MRSA infection nor to guide or monitor treatment for MRSA infections. Performed at [facility], [address] [place], [place] [address]   MRSA PCR Screening     Status: None   Collection Time: 11/15/18  5:23 PM  Result Value Ref Range Status   MRSA by PCR NEGATIVE NEGATIVE Final    Comment:        The GeneXpert MRSA Assay (FDA approved for NASAL specimens only), is one component of a comprehensive MRSA colonization surveillance program. It is not intended to diagnose MRSA infection nor to guide or monitor treatment for MRSA infections. Performed at [facility], [address] [place], [place] [address]          Radiology Studies: No results found.      Scheduled Meds: . azithromycin  500 mg Oral Q24H  . bisacodyl  10 mg Rectal Once  .  diltiazem  360 mg Oral Daily  . furosemide  20 mg Oral Daily  . guaiFENesin  1,200 mg Oral BID  . lisinopril  5 mg Oral Daily  . metoprolol tartrate  75 mg Oral BID  . mirtazapine  45 mg Oral QHS  . sodium chloride flush  3 mL Intravenous Q12H  . sodium chloride flush  3 mL Intravenous Q12H  . sodium chloride  2 g Oral BID WC  . venlafaxine  50 mg Oral BID WC   Continuous Infusions: . sodium chloride    . sodium chloride 75 mL/hr at 11/16/18 1110  . cefTRIAXone (ROCEPHIN)  IV Stopped (11/15/18 1749)     LOS: 11 days     Jacquelin Hawking, MD [facility] 11/16/2018, 12:38 PM  If 7PM-7AM, please  contact night-coverage [email or web address]

## 2018-11-16 NOTE — H&P (View-Only) (Signed)
Consultation  Referring Provider:   Dr. Caleb Popp Primary Care Physician:  [name], PA-C Primary Gastroenterologist: Gentry Fitz        Reason for Consultation: Anemia, GI bleed             HPI:   Adrienne Stone is a 73 y.o. female with a past medical history as listed below, who presented to the ER on 11/15/2018 with a "flulike illness".  We are consulted in regards to a hemoglobin of 10.6.    Today, the patient explains that she was feeling well until the evening of 11/03/2018 when she developed acute onset of chills with fatigue, aches including headache, cough, nausea with a few episodes of nonbloody vomiting and shortness of breath.  Tells me that she was starting to feel a little bit better, but Friday, 11/13/2018 she started with back tarry stool.  Describes 1 bowel movement that day, two the next and 2 more yesterday, the last was "very large and very smelly".  Describes she had a similar occurrence back in 2013/14 and eventually had a double balloon enteroscopy at [facility] which showed some "scar tissue that looked like it had been recently abraded" in the jejunum, per the patient.  She received 10 units of blood that stay and they "never really saw anything bleeding".  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  Tells me she had some complications from this with what sounds like dehiscence of the wound.  Associated symptoms include "terrible nausea" and generalized abdominal discomfort.    Denies weight loss or symptoms that awaken her from sleep.  ED course: Seen at [facility], febrile 38.1 Celsius, saturating mid 90s on room air, tachypneic in the 30s, tachycardic in the 120s and a blood pressure of 237/100, EKG was sinus tach and rate of 124 with PACs, chest x-ray negative for cardiopulmonary disease, can panel with a bicarbonate of 19, creatinine 1.03 (0.79 November), hemoglobin 10.6 (12.7 in November), influenza A was positive, lactic acid  normal and troponin undetectable; CTA chest was negative for PE but notable for patchy bilateral groundglass opacities in the upper lobes; received Rocephin, azithromycin, prednisone, DuoNeb and Lopressor, placed on BiPAP, tachycardia resolved, blood pressure improved  GI history: 07/05/2013 double-balloon enteroscopy-antegrade: Done for GI bleed after gastric bypass; 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 Depression   . Hypercholesteremia   . Hypertension     Past Surgical History:  Procedure Laterality Date  . ABDOMINAL HYSTERECTOMY    . CHOLECYSTECTOMY    . GASTRIC BYPASS    . thumb surgery      History reviewed. No pertinent family history.   Social History   Tobacco Use  . Smoking status: Former Games developer  . Smokeless tobacco: Never Used  Substance Use Topics  . Alcohol use: Yes    Comment: daily  . Drug use: No    Prior to Admission medications   Medication Sig Start Date End Date Taking? Authorizing Provider  acetaminophen (TYLENOL) 650 MG CR tablet Take 1,300 mg by mouth every 8 (eight) hours as needed for pain.   Yes [provider]  acetaminophen-codeine (TYLENOL #3) 300-30 MG tablet Take 1 tablet by mouth at bedtime. 10/16/14  Yes [provider]  Ascorbic Acid (VITAMIN C) 1000 MG tablet Take 1,000 mg by  mouth daily.   Yes [provider]  Calcium Carbonate-Vit D-Min (CALCIUM 1200 PO) Take by mouth.   Yes [provider]  cloNIDine (CATAPRES) 0.1 MG tablet Take 0.1 mg by mouth 2 (two) times daily. 01/27/18  Yes [provider]  fenofibrate 160 MG tablet Take 160 mg by mouth daily. 07/02/16  Yes [provider]  ferrous sulfate 325 (65 FE) MG tablet Take 325 mg by mouth daily with breakfast.   Yes [provider]  hydrochlorothiazide (MICROZIDE)  12.5 MG capsule Take 12.5 mg by mouth daily as needed (edema).    Yes [provider]  lisinopril (PRINIVIL,ZESTRIL) 40 MG tablet Take 40 mg by mouth daily.   Yes [provider]  loratadine (CLARITIN) 10 MG tablet Take 10 mg by mouth daily.   Yes [provider]  LORazepam (ATIVAN) 1 MG tablet Take 1 mg by mouth 2 (two) times daily as needed for anxiety. 05/21/17  Yes [provider]  Magnesium 250 MG TABS Take by mouth.   Yes [provider]  metoprolol tartrate (LOPRESSOR) 25 MG tablet Take 25 mg by mouth 2 (two) times daily.   Yes [provider]  Multiple Vitamin (MULTIVITAMIN) capsule Take 1 capsule by mouth daily.   Yes [provider]  multivitamin-lutein (OCUVITE-LUTEIN) CAPS capsule Take 1 capsule by mouth daily.   Yes [provider]  pantoprazole (PROTONIX) 40 MG tablet Take 40 mg by mouth 2 (two) times daily.    Yes [provider]  QUEtiapine (SEROQUEL) 25 MG tablet Take 50 mg by mouth at bedtime. Take 2 tablets (50mg ) at bedtime 11/02/18  Yes [provider]  temazepam (RESTORIL) 30 MG capsule Take 30 mg by mouth at bedtime as needed for sleep.   Yes [provider]  venlafaxine (EFFEXOR) 50 MG tablet Take 50 mg by mouth 2 (two) times daily.   Yes [provider]  escitalopram (LEXAPRO) 10 MG tablet Take 10 mg by mouth daily.    [provider]  mirtazapine (REMERON) 45 MG tablet Take 45 mg by mouth at bedtime.    [provider]    Current Facility-Administered Medications  Medication Dose Route Frequency Provider Last Rate Last Dose  . 0.9 %  sodium chloride infusion  250 mL Intravenous PRN Opyd, Lavone Neri, MD      . 0.9 %  sodium chloride infusion   Intravenous Continuous Narda Bonds, MD   Stopped at 11/16/18 1437  . acetaminophen (TYLENOL) tablet 650 mg  650 mg Oral Q6H PRN Opyd, Lavone Neri, MD   650 mg at 11/16/18 0840   Or  . acetaminophen (TYLENOL)  suppository 650 mg  650 mg Rectal Q6H PRN Opyd, Lavone Neri, MD      . azithromycin (ZITHROMAX) tablet 500 mg  500 mg Oral Q24H Narda Bonds, MD   500 mg at 11/15/18 1713  . benzonatate (TESSALON) capsule 200 mg  200 mg Oral TID PRN Coralie Keens, MD   200 mg at 11/15/18 2229  . bisacodyl (DULCOLAX) EC tablet 5 mg  5 mg Oral Daily PRN Audrea Muscat T, NP   5 mg at 11/13/18 0826  . bisacodyl (DULCOLAX) suppository 10 mg  10 mg Rectal Once Narda Bonds, MD      . cefTRIAXone (ROCEPHIN) 1 g in sodium chloride 0.9 % 100 mL IVPB  1 g Intravenous Q24H Narda Bonds, MD   Stopped at 11/15/18 1749  . diltiazem (CARDIZEM CD)  24 hr capsule 360 mg  360 mg Oral Daily Wendall Stade, MD   360 mg at 11/16/18 1128  . furosemide (LASIX) tablet 20 mg  20 mg Oral Daily Finnigan, Nancy A, DO   20 mg at 11/16/18 0841  . guaiFENesin (MUCINEX) 12 hr tablet 1,200 mg  1,200 mg Oral BID Narda Bonds, MD   1,200 mg at 11/16/18 1128  . hydrALAZINE (APRESOLINE) injection 10 mg  10 mg Intravenous Q4H PRN Opyd, Lavone Neri, MD   10 mg at 11/11/18 2305  . ipratropium-albuterol (DUONEB) 0.5-2.5 (3) MG/3ML nebulizer solution 3 mL  3 mL Nebulization Q4H PRN Arrien, York Ram, MD   3 mL at 11/07/18 1819  . lisinopril (PRINIVIL,ZESTRIL) tablet 5 mg  5 mg Oral Daily Arrien, York Ram, MD   5 mg at 11/16/18 0841  . LORazepam (ATIVAN) tablet 1 mg  1 mg Oral Q8H PRN Arrien, York Ram, MD   1 mg at 11/14/18 0121  . metoprolol tartrate (LOPRESSOR) tablet 75 mg  75 mg Oral BID Coralie Keens, MD   75 mg at 11/16/18 0840  . mirtazapine (REMERON) tablet 45 mg  45 mg Oral QHS Arrien, York Ram, MD   45 mg at 11/15/18 2220  . morphine 4 MG/ML injection 3 mg  3 mg Intravenous Q4H PRN Opyd, Lavone Neri, MD      . ondansetron (ZOFRAN) injection 4 mg  4 mg Intravenous Q4H PRN Arrien, York Ram, MD   4 mg at 11/14/18 2002  . pantoprazole (PROTONIX) EC tablet 40 mg  40 mg Oral BID Narda Bonds,  MD   40 mg at 11/16/18 1437  . polyethylene glycol (MIRALAX / GLYCOLAX) packet 17 g  17 g Oral Daily PRN Opyd, Lavone Neri, MD   17 g at 11/13/18 0826  . sodium chloride flush (NS) 0.9 % injection 3 mL  3 mL Intravenous Q12H Opyd, Lavone Neri, MD   3 mL at 11/15/18 0947  . sodium chloride flush (NS) 0.9 % injection 3 mL  3 mL Intravenous Q12H Opyd, Lavone Neri, MD   3 mL at 11/15/18 0425  . sodium chloride flush (NS) 0.9 % injection 3 mL  3 mL Intravenous PRN Opyd, Lavone Neri, MD      . sodium chloride tablet 2 g  2 g Oral BID WC Finnigan, Nancy A, DO   1 g at 11/16/18 1129  . temazepam (RESTORIL) capsule 30 mg  30 mg Oral QHS PRN Arrien, York Ram, MD   30 mg at 11/15/18 2221  . traMADol (ULTRAM) tablet 50 mg  50 mg Oral Q6H PRN Opyd, Lavone Neri, MD   50 mg at 11/16/18 1424  . venlafaxine (EFFEXOR) tablet 50 mg  50 mg Oral BID WC Arrien, York Ram, MD   50 mg at 11/16/18 0840    Allergies as of 11/04/2018 - Review Complete 11/04/2018  Allergen Reaction Noted  . Demerol [meperidine] Rash 03/05/2017  . Penicillins Rash 03/05/2017  . Prozac [fluoxetine hcl] Palpitations 03/05/2017  . Serzone [nefazodone] Rash 03/05/2017  . Sulfa antibiotics Rash 03/05/2017     Review of Systems:    Constitutional: No weight loss, fever or chills Skin: No rash  Cardiovascular: No chest pain Respiratory: No SOB Gastrointestinal: See HPI and otherwise negative Genitourinary: No dysuria Neurological: No headache Musculoskeletal: No new muscle or joint pain Hematologic: No bruising Psychiatric: No history of depression or anxiety    Physical Exam:  Vital signs in last 24  hours: Temp:  [97.8 F (36.6 C)-98.5 F (36.9 C)] 97.8 F (36.6 C) (02/10 1430) Pulse Rate:  [62-88] 76 (02/10 1430) Resp:  [18] 18 (02/10 1430) BP: (115-145)/(52-72) 120/57 (02/10 1430) SpO2:  [93 %-99 %] 99 % (02/10 1430) Weight:  [114.9 kg] 114.9 kg (02/10 0528) Last BM Date: 11/15/18 General:   Pleasant obese Caucasian  female appears to be in NAD, Well developed, Well nourished, alert and cooperative Head:  Normocephalic and atraumatic. Eyes:   PEERL, EOMI. No icterus. Conjunctiva pink. Ears:  Normal auditory acuity. Neck:  Supple Throat: Oral cavity and pharynx without inflammation, swelling or lesion.  Lungs: Respirations even and unlabored. Lungs clear to auscultation bilaterally.   No wheezes, crackles, or rhonchi.  Heart: Normal S1, S2. No MRG. Regular rate and rhythm. No peripheral edema, cyanosis or pallor.  Abdomen:  Soft, nondistended, mild generalized ttp. No rebound or guarding. Normal bowel sounds. No appreciable masses or hepatomegaly. Rectal:  Not performed.  Msk:  Symmetrical without gross deformities. Peripheral pulses intact.  Extremities:  Without edema, no deformity or joint abnormality.  Neurologic:  Alert and  oriented x4;  grossly normal neurologically.  Skin:   Dry and intact without significant lesions or rashes. Psychiatric: Demonstrates good judgement and reason without abnormal affect or behaviors.   LAB RESULTS: Recent Labs    11/15/18 1240 11/16/18 0621 11/16/18 1110  WBC 18.5* 15.7* 15.2*  HGB 9.2* 7.8* 7.1*  HCT 26.8* 23.9* 20.9*  PLT 559* 517* 529*   BMET Recent Labs    11/15/18 1016 11/15/18 1615 11/16/18 0706  NA 122* 123* 129*  K 4.2 4.8 4.8  CL 89* 92* 97*  CO2 22 18* 20*  GLUCOSE 191* 122* 116*  BUN 42* 44* 39*  CREATININE 1.10* 1.01* 1.07*  CALCIUM 8.3* 8.0* 8.0*    Impression / Plan:   Impression: 1. Anemia: Hemoglobin 10.6 on admission(down from 12.7 in November), now 7.1, melena with positive Hemoccult, 5 episodes of melena reported over the past 36 hours, last yesterday, history of similar symptoms in 2013 with lesion found of the jejunum per patient, also history of gastric bypass in 2000; consider ulcer versus other upper GI source 2.  Influenza A: With acute respiratory failure and hypoxia, stable now 3.  Hyponatremia 4.  A. fib with RVR:  New onset, cardiology following, rate controlled  Plan: 1.  Continue Protonix twice daily 2.  Continue to monitor hemoglobin with transfusion as needed less than 7 3.  We will plan for EGD tomorrow with Dr. Adela LankArmbruster.  Did discuss risks, benefits, limitations and alternatives and patient agrees to proceed. 4.  Patient will be on a clear liquid diet today and n.p.o. after midnight 5.  Please await any further recommendations from Dr. Adela LankArmbruster later today.  Thank you for your kind consultation, we will continue to follow.  Violet BaldyJennifer Lynne [facility]  11/16/2018, 2:46 PM

## 2018-11-16 NOTE — Progress Notes (Signed)
[facility]    NEPHROLOGY PROGRESS NOTE  SUBJECTIVE: Feeling somewhat better.  Nausea has subsided.  Denies chest pain, shortness of breath, dysuria, hematuria, arthralgias or myalgias.  Does complain of generalized weakness.  All other review of systems are negative.     OBJECTIVE:  Vitals:   11/16/18 1430 11/16/18 1500  BP: (!) 120/57 122/62  Pulse: 76 69  Resp: 18   Temp: 97.8 F (36.6 C) 97.7 F (36.5 C)  SpO2: 99% 96%    Intake/Output Summary (Last 24 hours) at 11/16/2018 1707 Last data filed at 11/16/2018 1500 Gross per 24 hour  Intake 2872.66 ml  Output 700 ml  Net 2172.66 ml      Genearl:  AAOx3 NAD HEENT: MMM [place] AT anicteric sclera Neck:  No JVD, no adenopathy CV:  Heart RRR  Lungs:  L/S CTA bilaterally Abd:  abd SNT/ND with normal BS GU:  Bladder non-palpable Extremities: 1 bilateral upper and lower extremity edema. Skin:  No skin rash  MEDICATIONS:  . azithromycin  500 mg Oral Q24H  . bisacodyl  10 mg Rectal Once  . diltiazem  360 mg Oral Daily  . furosemide  20 mg Oral Daily  . guaiFENesin  1,200 mg Oral BID  . lisinopril  5 mg Oral Daily  . metoprolol tartrate  75 mg Oral BID  . mirtazapine  45 mg Oral QHS  . pantoprazole (PROTONIX) IV  40 mg Intravenous Q12H  . sodium chloride flush  3 mL Intravenous Q12H  . sodium chloride flush  3 mL Intravenous Q12H  . sodium chloride  2 g Oral BID WC  . venlafaxine  50 mg Oral BID WC       LABS:   CBC Latest Ref Rng & Units 11/16/2018 11/16/2018 11/15/2018  WBC 4.0 - 10.5 K/uL 15.2(H) 15.7(H) 18.5(H)  Hemoglobin 12.0 - 15.0 g/dL 7.1(L) 7.8(L) 9.2(L)  Hematocrit 36.0 - 46.0 % 20.9(L) 23.9(L) 26.8(L)  Platelets 150 - 400 K/uL 529(H) 517(H) 559(H)    CMP Latest Ref Rng & Units 11/16/2018 11/15/2018 11/15/2018  Glucose 70 - 99 mg/dL 956(L) 875(I) 433(I)  BUN 8 - 23 mg/dL 95(J) 88(C) 16(S)  Creatinine 0.44 - 1.00 mg/dL 0.63(K) 1.60(F) 0.93(A)  Sodium 135 - 145 mmol/L 129(L) 123(L) 122(L)   Potassium 3.5 - 5.1 mmol/L 4.8 4.8 4.2  Chloride 98 - 111 mmol/L 97(L) 92(L) 89(L)  CO2 22 - 32 mmol/L 20(L) 18(L) 22  Calcium 8.9 - 10.3 mg/dL 8.0(L) 8.0(L) 8.3(L)  Total Protein 6.5 - 8.1 g/dL - - -  Total Bilirubin 0.3 - 1.2 mg/dL - - -  Alkaline Phos 38 - 126 U/L - - -  AST 15 - 41 U/L - - -  ALT 0 - 44 U/L - - -    Lab Results  Component Value Date   CALCIUM 8.0 (L) 11/16/2018   CAION 1.25 11/04/2018       Component Value Date/Time   COLORURINE YELLOW 11/04/2018 1701   APPEARANCEUR CLEAR 11/04/2018 1701   LABSPEC 1.015 11/04/2018 1701   PHURINE 5.0 11/04/2018 1701   GLUCOSEU NEGATIVE 11/04/2018 1701   HGBUR NEGATIVE 11/04/2018 1701   BILIRUBINUR NEGATIVE 11/04/2018 1701   KETONESUR NEGATIVE 11/04/2018 1701   PROTEINUR NEGATIVE 11/04/2018 1701   NITRITE NEGATIVE 11/04/2018 1701   LEUKOCYTESUR NEGATIVE 11/04/2018 1701      Component Value Date/Time   PHART 7.355 11/04/2018 2148   PCO2ART 39.4 11/04/2018 2148   PO2ART 141.0 (H) 11/04/2018 2148   HCO3  21.9 11/04/2018 2148   TCO2 23 11/04/2018 2148   ACIDBASEDEF 3.0 (H) 11/04/2018 2148   O2SAT 99.0 11/04/2018 2148       Component Value Date/Time   IRON 12 (L) 11/05/2018 0400   TIBC 311 11/05/2018 0400   FERRITIN 136 11/05/2018 0400   IRONPCTSAT 4 (L) 11/05/2018 0400       ASSESSMENT/PLAN:     1.  Hyponatremia.  Urine sodium 14 with a urine osmolality of 611, arguing against SIADH.  Urine osmolality has continued to be elevated on 3 occasions.  We will continue furosemide to help excrete additional free water.  We will also continue salt tablets.  Suspect this could still be related to hydrochlorothiazide given its relatively long half-life.  Sodium improving  2.  Influenza.  Per primary team.  3.  Atrial fibrillation.  Rate control HAS improved.    [address] StreetNancy GenevaFinnigan, DO, [place]

## 2018-11-16 NOTE — Progress Notes (Signed)
Physical Therapy Treatment Patient Details [name] MRN: [id number] DOB: 10/18/1945 Today's Date: 11/16/2018    History of Present Illness Pt is a 73 y/o female who presents with cough, sore throat, SOB. Admitted with acute respiratory failure due to + flu A, PNA complicated by sepsis and A-fib. CTA chest- patchy bil opacities suspicious for infectious or inflammatory PNA. PMH includes HTN, depression, hypercholesteremia.    PT Comments    Pt admitted with above diagnosis. Pt currently with functional limitations due to endurance and strength deficits. Pt was only able to exercise in bed.  Nurse was getting ready to hang blood.  Pt will benefit from skilled PT to increase their independence and safety with mobility to allow discharge to the venue listed below.     Follow Up Recommendations  SNF;Supervision for mobility/OOB     Equipment Recommendations  3in1 (PT)    Recommendations for Other Services       Precautions / Restrictions Precautions Precautions: Fall Precaution Comments: watch HR Restrictions Weight Bearing Restrictions: No    Mobility  Bed Mobility               General bed mobility comments: Nurse getting ready to give pt blood.  Bed level exercises on ly.   Transfers                    Ambulation/Gait                 Stairs             Wheelchair Mobility    Modified Rankin (Stroke Patients Only)       Balance                                            Cognition Arousal/Alertness: Awake/alert Behavior During Therapy: Flat affect Overall Cognitive Status: Within Functional Limits for tasks assessed                                 General Comments: Reports being fearful of falling      Exercises General Exercises - Upper Extremity Shoulder Flexion: AROM;Both;10 reps;Supine Shoulder Horizontal ADduction: AROM;Both;10 reps;Supine Elbow Flexion: AROM;Both;10  reps;Supine General Exercises - Lower Extremity Ankle Circles/Pumps: AROM;Both;10 reps;Supine Quad Sets: AROM;Both;10 reps;Supine Heel Slides: AROM;Both;10 reps;Supine    General Comments        Pertinent Vitals/Pain Pain Assessment: Faces Faces Pain Scale: Hurts even more Pain Location: chronic back pain, neck Pain Descriptors / Indicators: Discomfort;Constant Pain Intervention(s): Limited activity within patient's tolerance;Monitored during session;Premedicated before session;Repositioned    Home Living                      Prior Function            PT Goals (current goals can now be found in the care plan section) Acute Rehab PT Goals Patient Stated Goal: Feel better; does not feel safe to return home Progress towards PT goals: Progressing toward goals    Frequency    Min 2X/week      PT Plan Current plan remains appropriate    Co-evaluation              AM-PAC PT "6 Clicks" Mobility   Outcome Measure  Help needed turning from your back  to your side while in a flat bed without using bedrails?: A Little Help needed moving from lying on your back to sitting on the side of a flat bed without using bedrails?: A Little Help needed moving to and from a bed to a chair (including a wheelchair)?: A Little Help needed standing up from a chair using your arms (e.g., wheelchair or bedside chair)?: A Little Help needed to walk in hospital room?: A Little Help needed climbing 3-5 steps with a railing? : A Lot 6 Click Score: 17    End of Session Equipment Utilized During Treatment: Gait belt Activity Tolerance: Patient limited by fatigue Patient left: in bed;with call bell/phone within reach Nurse Communication: Mobility status PT Visit Diagnosis: Difficulty in walking, not elsewhere classified (R26.2)     Time: 2263-3354 PT Time Calculation (min) (ACUTE ONLY): 13 min  Charges:  $Therapeutic Exercise: 8-22 mins                     Taran Hable,PT Acute Rehabilitation Services Pager:  [phone]  Office:  [phone]     Berline Lopes 11/16/2018, 3:52 PM

## 2018-11-17 ENCOUNTER — Encounter (HOSPITAL_COMMUNITY): Payer: Self-pay | Admitting: *Deleted

## 2018-11-17 ENCOUNTER — Inpatient Hospital Stay (HOSPITAL_COMMUNITY): Payer: Medicare HMO | Admitting: Anesthesiology

## 2018-11-17 ENCOUNTER — Encounter (HOSPITAL_COMMUNITY)
Admission: EM | Disposition: A | Discharge status: Skilled Nursing Facility | Payer: Self-pay | Source: Home / Self Care | Attending: Family Medicine

## 2018-11-17 DIAGNOSIS — K250 Acute gastric ulcer with hemorrhage: Secondary | ICD-10-CM

## 2018-11-17 DIAGNOSIS — K254 Chronic or unspecified gastric ulcer with hemorrhage: Secondary | ICD-10-CM

## 2018-11-17 HISTORY — PX: ESOPHAGOGASTRODUODENOSCOPY (EGD) WITH PROPOFOL: SHX5813

## 2018-11-17 HISTORY — PX: BIOPSY: SHX5522

## 2018-11-17 LAB — BPAM RBC
Blood Product Expiration Date: 202002192359
Blood Product Expiration Date: 202002242359
ISSUE DATE / TIME: 202002101435
ISSUE DATE / TIME: 202002102032
Unit Type and Rh: 9500
Unit Type and Rh: 9500

## 2018-11-17 LAB — TYPE AND SCREEN
ABO/RH(D): O NEG
ANTIBODY SCREEN: NEGATIVE
Unit division: 0
Unit division: 0

## 2018-11-17 LAB — CBC
HCT: 31 % — ABNORMAL LOW (ref 36.0–46.0)
HCT: 30.8 % — ABNORMAL LOW (ref 36.0–46.0)
HCT: 28.2 % — ABNORMAL LOW (ref 36.0–46.0)
Hemoglobin: 10.4 g/dL — ABNORMAL LOW (ref 12.0–15.0)
Hemoglobin: 10 g/dL — ABNORMAL LOW (ref 12.0–15.0)
Hemoglobin: 9.4 g/dL — ABNORMAL LOW (ref 12.0–15.0)
MCH: 29.5 pg (ref 26.0–34.0)
MCH: 28.7 pg (ref 26.0–34.0)
MCH: 29.5 pg (ref 26.0–34.0)
MCHC: 33.5 g/dL (ref 30.0–36.0)
MCHC: 32.5 g/dL (ref 30.0–36.0)
MCHC: 33.3 g/dL (ref 30.0–36.0)
MCV: 87.8 fL (ref 80.0–100.0)
MCV: 88.3 fL (ref 80.0–100.0)
MCV: 88.4 fL (ref 80.0–100.0)
NRBC: 0 % (ref 0.0–0.2)
NRBC: 0 % (ref 0.0–0.2)
PLATELETS: 421 10*3/uL — AB (ref 150–400)
Platelets: 432 10*3/uL — ABNORMAL HIGH (ref 150–400)
Platelets: 454 10*3/uL — ABNORMAL HIGH (ref 150–400)
RBC: 3.53 MIL/uL — ABNORMAL LOW (ref 3.87–5.11)
RBC: 3.49 MIL/uL — ABNORMAL LOW (ref 3.87–5.11)
RBC: 3.19 MIL/uL — ABNORMAL LOW (ref 3.87–5.11)
RDW: 14.1 % (ref 11.5–15.5)
RDW: 14.6 % (ref 11.5–15.5)
RDW: 14.9 % (ref 11.5–15.5)
WBC: 16.8 10*3/uL — AB (ref 4.0–10.5)
WBC: 14.6 10*3/uL — ABNORMAL HIGH (ref 4.0–10.5)
WBC: 14.6 10*3/uL — AB (ref 4.0–10.5)
nRBC: 0 % (ref 0.0–0.2)

## 2018-11-17 LAB — BASIC METABOLIC PANEL
Anion gap: 7 (ref 5–15)
BUN: 28 mg/dL — ABNORMAL HIGH (ref 8–23)
CO2: 21 mmol/L — ABNORMAL LOW (ref 22–32)
Calcium: 8.3 mg/dL — ABNORMAL LOW (ref 8.9–10.3)
Chloride: 110 mmol/L (ref 98–111)
Creatinine, Ser: 0.8 mg/dL (ref 0.44–1.00)
GFR calc Af Amer: >60 mL/min (ref >60)
Glucose, Bld: 110 mg/dL — ABNORMAL HIGH (ref 70–99)
POTASSIUM: 4.2 mmol/L (ref 3.5–5.1)
SODIUM: 138 mmol/L (ref 135–145)

## 2018-11-17 LAB — PROCALCITONIN: Procalcitonin: <0.1 ng/mL

## 2018-11-17 LAB — GLUCOSE, CAPILLARY: GLUCOSE-CAPILLARY: 98 mg/dL (ref 70–99)

## 2018-11-17 SURGERY — ESOPHAGOGASTRODUODENOSCOPY (EGD) WITH PROPOFOL
Anesthesia: Monitor Anesthesia Care

## 2018-11-17 MED ORDER — BUTAMBEN-TETRACAINE-BENZOCAINE 2-2-14 % EX AERO
INHALATION_SPRAY | CUTANEOUS | Status: DC | PRN
  Administered 2018-11-17: 2 via TOPICAL

## 2018-11-17 MED ORDER — PROPOFOL 500 MG/50ML IV EMUL
INTRAVENOUS | Status: DC | PRN
  Administered 2018-11-17: 50 ug/kg/min via INTRAVENOUS

## 2018-11-17 MED ORDER — LIDOCAINE HCL (CARDIAC) PF 100 MG/5ML IV SOSY
PREFILLED_SYRINGE | INTRAVENOUS | Status: DC | PRN
  Administered 2018-11-17: 50 mg via INTRAVENOUS

## 2018-11-17 SURGICAL SUPPLY — 15 items

## 2018-11-17 NOTE — Progress Notes (Signed)
[facility]    NEPHROLOGY PROGRESS NOTE  SUBJECTIVE: EGD today and c/o worse chronic back pain today following positioning.  Continues to feel weak.  She did not have BMP this AM.  I/Os yesterday 2.6 / 1.6.    OBJECTIVE:  Vitals:   11/17/18 1320 11/17/18 1330  BP: (!) 92/40 (!) 111/51  Pulse: 80 76  Resp: 20   Temp: 97.7 F (36.5 C)   SpO2: 97% 97%    Intake/Output Summary (Last 24 hours) at 11/17/2018 1444 Last data filed at 11/17/2018 1420 Gross per 24 hour  Intake 1821.69 ml  Output 1750 ml  Net 71.69 ml      Genearl:  AAOx3 NAD HEENT: MMM Coleman AT anicteric sclera Neck:  No JVD CV:  Heart RRR  Lungs:  L/S CTA bilaterally Abd:  abd SNT/ND with normal BS GU:  Bladder non-palpable Extremities: trace edema Skin:  No skin rash  MEDICATIONS:  . azithromycin  500 mg Oral Q24H  . bisacodyl  10 mg Rectal Once  . diltiazem  360 mg Oral Daily  . furosemide  20 mg Oral Daily  . guaiFENesin  1,200 mg Oral BID  . lisinopril  5 mg Oral Daily  . metoprolol tartrate  75 mg Oral BID  . mirtazapine  45 mg Oral QHS  . pantoprazole (PROTONIX) IV  40 mg Intravenous Q12H  . sodium chloride flush  3 mL Intravenous Q12H  . sodium chloride flush  3 mL Intravenous Q12H  . sodium chloride  2 g Oral BID WC  . venlafaxine  50 mg Oral BID WC       LABS:   CBC Latest Ref Rng & Units 11/17/2018 11/17/2018 11/16/2018  WBC 4.0 - 10.5 K/uL 14.6(H) 16.8(H) 15.3(H)  Hemoglobin 12.0 - 15.0 g/dL 10.0(L) 10.4(L) 8.4(L)  Hematocrit 36.0 - 46.0 % 30.8(L) 31.0(L) 24.9(L)  Platelets 150 - 400 K/uL 421(H) 432(H) 499(H)    CMP Latest Ref Rng & Units 11/16/2018 11/15/2018 11/15/2018  Glucose 70 - 99 mg/dL 814(G) 818(H) 631(S)  BUN 8 - 23 mg/dL 97(W) 26(V) 78(H)  Creatinine 0.44 - 1.00 mg/dL 8.85(O) 2.77(A) 1.28(N)  Sodium 135 - 145 mmol/L 129(L) 123(L) 122(L)  Potassium 3.5 - 5.1 mmol/L 4.8 4.8 4.2  Chloride 98 - 111 mmol/L 97(L) 92(L) 89(L)  CO2 22 - 32 mmol/L 20(L) 18(L) 22  Calcium  8.9 - 10.3 mg/dL 8.0(L) 8.0(L) 8.3(L)  Total Protein 6.5 - 8.1 g/dL - - -  Total Bilirubin 0.3 - 1.2 mg/dL - - -  Alkaline Phos 38 - 126 U/L - - -  AST 15 - 41 U/L - - -  ALT 0 - 44 U/L - - -    Lab Results  Component Value Date   CALCIUM 8.0 (L) 11/16/2018   CAION 1.25 11/04/2018       Component Value Date/Time   COLORURINE YELLOW 11/04/2018 1701   APPEARANCEUR CLEAR 11/04/2018 1701   LABSPEC 1.015 11/04/2018 1701   PHURINE 5.0 11/04/2018 1701   GLUCOSEU NEGATIVE 11/04/2018 1701   HGBUR NEGATIVE 11/04/2018 1701   BILIRUBINUR NEGATIVE 11/04/2018 1701   KETONESUR NEGATIVE 11/04/2018 1701   PROTEINUR NEGATIVE 11/04/2018 1701   NITRITE NEGATIVE 11/04/2018 1701   LEUKOCYTESUR NEGATIVE 11/04/2018 1701      Component Value Date/Time   PHART 7.355 11/04/2018 2148   PCO2ART 39.4 11/04/2018 2148   PO2ART 141.0 (H) 11/04/2018 2148   HCO3 21.9 11/04/2018 2148   TCO2 23 11/04/2018 2148   ACIDBASEDEF 3.0 (  H) 11/04/2018 2148   O2SAT 99.0 11/04/2018 2148       Component Value Date/Time   IRON 12 (L) 11/05/2018 0400   TIBC 311 11/05/2018 0400   FERRITIN 136 11/05/2018 0400   IRONPCTSAT 4 (L) 11/05/2018 0400       ASSESSMENT/PLAN:     1.  Hyponatremia.  Urine sodium 14 with a urine osmolality of 611, arguing against SIADH.  Urine osmolality has continued to be elevated on 3 occasions.  We will continue furosemide to help excrete additional free water.  We will also continue salt tablets.  Suspect this could still be related to hydrochlorothiazide given its relatively long half-life.  Sodium improving - given such, did not order labs for today.  Will check tomorrow.   2.  Influenza.  Per primary team.  3.  Atrial fibrillation.  Rate control has improved.  OK to discharge from nephrology perspective.  If goes should have labs in 2-3 days.  These can be sent to my office for review. [phone] fax.   Estill BakesLindsay  MD [facility] Pager [phone]

## 2018-11-17 NOTE — Anesthesia Postprocedure Evaluation (Signed)
Anesthesia Post Note  Patient: Adrienne Stone  Procedure(s) Performed: ESOPHAGOGASTRODUODENOSCOPY (EGD) WITH PROPOFOL (N/A ) BIOPSY     Patient location during evaluation: PACU Anesthesia Type: MAC Level of consciousness: awake and alert Pain management: pain level controlled Vital Signs Assessment: post-procedure vital signs reviewed and stable Respiratory status: spontaneous breathing, nonlabored ventilation and respiratory function stable Cardiovascular status: stable and blood pressure returned to baseline Anesthetic complications: no    Last Vitals:  Vitals:   11/17/18 0950 11/17/18 1006  BP: 101/82 (!) 101/36  Pulse:  97  Resp: (!) 28 (!) 22  Temp: 36.6 C   SpO2: 96% 99%    Last Pain:  Vitals:   11/17/18 1006  TempSrc:   PainSc: 0-No pain                 Audry Pili

## 2018-11-17 NOTE — Progress Notes (Signed)
PROGRESS NOTE    Adrienne Stone  ION:[id number] DOB: [birth year] DOA: 11/04/2018 PCP: Darryl Lentaylor, Amanda, PA-C   Brief Narrative: Adrienne Stone is a 73 y.o. female who presented with chills, cough, fatigue, nausea vomiting. She does have significant past medical history for hypertension, depression and obesity. She presented secondary to ILI found to have influenza A infection and developed atrial fibrillation with RVR. Rate stable.   Assessment & Plan:   Principal Problem:   Influenza A Active Problems:   Acute respiratory failure with hypoxia (HCC)   Acute respiratory distress   Hypertensive urgency   Anemia   Atrial fibrillation with RVR (HCC)   Melena   Acute gastric ulcer with hemorrhage   Hyponatremia Patient takes hydrochlorothiazide as an outpatient "as needed." Likely secondary to hydrochlorothiazide resumed inpatient. Initial improvement then started worsening. NS IV fluids started with slow improvement. Nephrology consulted. Improving with nephrology management -Nephrology recommendations: IV fluids, salt tabs, lasix  Influenza A infection Completed Tamiflu course  Atrial fibrillation with RVR New onset. Cardiology on board. Rate controlled. -Cardiology recommendations: Metoprolol, Cardizem, outpatient follow-up  Acute respiratory failure with hypoxia Secondary to influenza infection. CT scan with infiltrates likely related to influenza. Patient currently on room air.  Essential hypertension Controlled. -Continue medications per cardiology recommendations  Depression -Continue temazepam, lorazepam, mirtazapine, venlafaxine  Hypokalemia Supplementation given.  Multifocal pneumonia Productive cough, elevated WBC, worsened chest x-ray. Concern for bacterial pneumonia. MRSA pcr negative. Procalcitonin undetectable. Patient is improved clinically. WBC still slightly elevated. -Continue Ceftriaxone and azithromycin  Acute blood loss anemia Acute on  chronic anemia Upper GI bleed. 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 Biopsies obtained. -CBC q 6 hours -Protonix BID -GI recommendations: full liquid diet and advance as tolerated, biopsies pending  DVT prophylaxis: Lovenox Code Status:   Code Status: DNR Family Communication: None at bedside Disposition Plan: Discharge to SNF pending improvement of sodium in addition to anemia workup/management   Consultants:   Cardiology  Nephrology  Gastroenterology  Procedures:   Transthoracic Echocardiogram (11/09/2018) IMPRESSIONS    1. The left ventricle has normal systolic function of 60-65%. The cavity size is normal. There is moderate concentric left ventricular wall hypertrophy. . The left ventricular diastology could not be evaluatedsecondary to atrial fibrillation.  2. Severely dilated left atrial size.  3. There is moderate mitral annular calcification present. Mitral regurgitation is trivial by color flow Doppler.  4. No atrial level shunt detected by color flow Doppler.  EGD (11/17/2018) Impression:               - Esophagogastric landmarks identified.                           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                           - Erythematous mucosa in the gastric pouch.                            Biopsied to rule out H pylori.                           - Normal examined small bowel limb Recommendation:           - Return patient to hospital ward for ongoing care.                           - Full liquid diet now and then advance as                            tolerated.                           - Continue present medications - IV protonix 40mg                             BID while  inpatient                           - Once discharged, transition to oral omeprazole                            40mg  capsules - the capsules should be broken open                            and contents swallowed with something soft to                            maximize absorption                           - Await pathology results, treat H pylori if                            positive  Antimicrobials:  Ceftriaxone (1/29)  Azithromycin (1/29)  Tamiflu (1/30>>2/3)    Subjective: Sleepy after anaesthesia for EGD. No other issues at this time.  Objective: Vitals:   11/17/18 1105 11/17/18 1120 11/17/18 1320 11/17/18 1330  BP: (!) 118/37 (!) 123/59 (!) 92/40 (!) 111/51  Pulse: (!) 101 94 80 76  Resp:   20   Temp:   97.7 F (36.5 C)   TempSrc:   Oral   SpO2: 98% 99% 97% 97%  Weight:      Height:        Intake/Output Summary (Last 24 hours) at 11/17/2018 1536 Last data filed at 11/17/2018 1420 Gross per 24 hour  Intake 1668.84 ml  Output 1750 ml  Net -81.16 ml   Filed Weights   11/16/18 0528 11/17/18 0532 11/17/18 0914  Weight: 114.9 kg 114.9 kg 114.9 kg    Examination:  General exam: Appears calm and  comfortable  Respiratory system: Diminished. Respiratory effort normal. Cardiovascular system: S1 & S2 heard, Irregular rhythm, normal rate. Faint murmur. Gastrointestinal system: Abdomen is nondistended, soft and nontender. No organomegaly or masses felt. Normal bowel sounds heard. Central nervous system: Alert and oriented. No focal neurological deficits. Extremities: No calf tenderness Skin: No cyanosis. No rashes Psychiatry: Judgement and insight appear normal. Mood & affect appropriate.     Data Reviewed: I have personally reviewed following labs and imaging studies  CBC: Recent Labs  Lab 11/16/18 1110 11/16/18 2010 11/17/18 0257 11/17/18 0831 11/17/18 1430  WBC 15.2* 15.3* 16.8* 14.6* 14.6*  HGB 7.1* 8.4* 10.4* 10.0* 9.4*  HCT 20.9* 24.9* 31.0* 30.8*  28.2*  MCV 88.9 88.6 87.8 88.3 88.4  PLT 529* 499* 432* 421* 454*   Basic Metabolic Panel: Recent Labs  Lab 11/15/18 0422 11/15/18 1016 11/15/18 1615 11/16/18 0706 11/17/18 1430  NA 124* 122* 123* 129* 138  K 4.0 4.2 4.8 4.8 4.2  CL 88* 89* 92* 97* 110  CO2 23 22 18* 20* 21*  GLUCOSE 113* 191* 122* 116* 110*  BUN 41* 42* 44* 39* 28*  CREATININE 0.99 1.10* 1.01* 1.07* 0.80  CALCIUM 8.1* 8.3* 8.0* 8.0* 8.3*   GFR: Estimated Creatinine Clearance: 75.4 mL/min (by C-G formula based on SCr of 0.8 mg/dL). Liver Function Tests: No results for input(s): AST, ALT, ALKPHOS, BILITOT, PROT, ALBUMIN in the last 168 hours. No results for input(s): LIPASE, AMYLASE in the last 168 hours. No results for input(s): AMMONIA in the last 168 hours. Coagulation Profile: No results for input(s): INR, PROTIME in the last 168 hours. Cardiac Enzymes: No results for input(s): CKTOTAL, CKMB, CKMBINDEX, TROPONINI in the last 168 hours. BNP (last 3 results) No results for input(s): PROBNP in the last 8760 hours. HbA1C: No results for input(s): HGBA1C in the last 72 hours. CBG: Recent Labs  Lab 11/12/18 0741 11/13/18 0837 11/14/18 0737 11/15/18 0748 11/17/18 0741  GLUCAP 126* 198* 130* 113* 98   Lipid Profile: No results for input(s): CHOL, HDL, LDLCALC, TRIG, CHOLHDL, LDLDIRECT in the last 72 hours. Thyroid Function Tests: No results for input(s): TSH, T4TOTAL, FREET4, T3FREE, THYROIDAB in the last 72 hours. Anemia Panel: No results for input(s): VITAMINB12, FOLATE, FERRITIN, TIBC, IRON, RETICCTPCT in the last 72 hours. Sepsis Labs: Recent Labs  Lab 11/15/18 1016 11/16/18 0442 11/17/18 0831  PROCALCITON 0.10 0.11 <0.10    Recent Results (from the past 240 hour(s))  MRSA PCR Screening     Status: None   Collection Time: 11/08/18 11:04 AM  Result Value Ref Range Status   MRSA by PCR NEGATIVE NEGATIVE Final    Comment:        The GeneXpert MRSA Assay (FDA approved for NASAL  specimens only), is one component of a comprehensive MRSA colonization surveillance program. It is not intended to diagnose MRSA infection nor to guide or monitor treatment for MRSA infections. Performed at [facility], [address] [place], [place] [address]   Culture, blood (routine x 2) Call MD if unable to obtain prior to antibiotics being given     Status: None (Preliminary result)   Collection Time: 11/15/18  4:33 PM  Result Value Ref Range Status   Specimen Description BLOOD RIGHT HAND  Final   Special Requests   Final    BOTTLES DRAWN AEROBIC ONLY Blood Culture results may not be optimal due to an inadequate volume of blood received in culture bottles   Culture   Final  NO GROWTH 2 DAYS Performed at [facility], [address], [place], [place] [address]    Report Status PENDING  Incomplete  Culture, blood (routine x 2) Call MD if unable to obtain prior to antibiotics being given     Status: None (Preliminary result)   Collection Time: 11/15/18  4:40 PM  Result Value Ref Range Status   Specimen Description BLOOD LEFT ARM  Final   Special Requests   Final    BOTTLES DRAWN AEROBIC ONLY Blood Culture results may not be optimal due to an inadequate volume of blood received in culture bottles   Culture   Final    NO GROWTH 2 DAYS Performed at [facility], [address] [place], [place] [address]    Report Status PENDING  Incomplete  MRSA PCR Screening     Status: None   Collection Time: 11/15/18  5:23 PM  Result Value Ref Range Status   MRSA by PCR NEGATIVE NEGATIVE Final    Comment:        The GeneXpert MRSA Assay (FDA approved for NASAL specimens only), is one component of a comprehensive MRSA colonization surveillance program. It is not intended to diagnose MRSA infection nor to guide or monitor treatment for MRSA infections. Performed at [facility], [address], [place], [place] [address]          Radiology  Studies: No results found.      Scheduled Meds: . azithromycin  500 mg Oral Q24H  . bisacodyl  10 mg Rectal Once  . diltiazem  360 mg Oral Daily  . furosemide  20 mg Oral Daily  . guaiFENesin  1,200 mg Oral BID  . lisinopril  5 mg Oral Daily  . metoprolol tartrate  75 mg Oral BID  . mirtazapine  45 mg Oral QHS  . pantoprazole (PROTONIX) IV  40 mg Intravenous Q12H  . sodium chloride flush  3 mL Intravenous Q12H  . sodium chloride flush  3 mL Intravenous Q12H  . sodium chloride  2 g Oral BID WC  . venlafaxine  50 mg Oral BID WC   Continuous Infusions: . sodium chloride    . sodium chloride 75 mL/hr at 11/17/18 1146  . cefTRIAXone (ROCEPHIN)  IV Stopped (11/16/18 1847)     LOS: 12 days     Jacquelin Hawking, MD [facility] 11/17/2018, 3:36 PM  If 7PM-7AM, please contact night-coverage [email or web address]

## 2018-11-17 NOTE — Progress Notes (Signed)
PT Cancellation Note  Patient Details [name] MRN: [id number] DOB: Jul 28, 1946   Cancelled Treatment:    Reason Eval/Treat Not Completed: Patient at procedure or test/unavailable (EGD with propofol). Will follow-up for PT treatment as schedule permits.  Ina HomesJaclyn Trayton Szabo, PT, DPT Acute Rehabilitation Services  Pager [phone] Office [phone]  Malachy ChamberJaclyn L Kori Goins 11/17/2018, 9:33 AM

## 2018-11-17 NOTE — Interval H&P Note (Signed)
History and Physical Interval Note:  11/17/2018 9:12 AM  Adrienne Stone  has presented today for surgery, with the diagnosis of melena, anemia  The various methods of treatment have been discussed with the patient and family. After consideration of risks, benefits and other options for treatment, the patient has consented to  Procedure(s): ESOPHAGOGASTRODUODENOSCOPY (EGD) WITH PROPOFOL (N/A) as a surgical intervention .  The patient's history has been reviewed, patient examined, no change in status, stable for surgery.  I have reviewed the patient's chart and labs.  Questions were answered to the patient's satisfaction.     Viviann Spare P Keirstan Iannello

## 2018-11-17 NOTE — Social Work (Signed)
[place] will need to obtain new auth for patient, as initial Berkley Harvey will expire tomorrow. Will need updated PT notes tomorrow. Noted PT attempt to see patient today. CSW to follow and support with discharge planning.  Abigail Butts, LCSW [phone]

## 2018-11-17 NOTE — Transfer of Care (Signed)
Immediate Anesthesia Transfer of Care Note  Patient: Adrienne Stone  Procedure(s) Performed: ESOPHAGOGASTRODUODENOSCOPY (EGD) WITH PROPOFOL (N/A ) BIOPSY  Patient Location: Endoscopy Unit  Anesthesia Type:MAC  Level of Consciousness: awake and alert   Airway & Oxygen Therapy: Patient Spontanous Breathing and Patient connected to nasal cannula oxygen  Post-op Assessment: Report given to RN and Post -op Vital signs reviewed and stable  Post vital signs: Reviewed and stable  Last Vitals:  Vitals Value Taken Time  BP    Temp    Pulse    Resp    SpO2      Last Pain:  Vitals:   11/17/18 0914  TempSrc: Oral  PainSc: 0-No pain      Patients Stated Pain Goal: 5 (10/93/23 5573)  Complications: No apparent anesthesia complications

## 2018-11-17 NOTE — Anesthesia Preprocedure Evaluation (Addendum)
Anesthesia Evaluation  Patient identified by MRN, date of birth, ID band Patient awake    Reviewed: Allergy & Precautions, NPO status , Patient's Chart, lab work & pertinent test results  History of Anesthesia Complications Negative for: history of anesthetic complications  Airway Mallampati: III  TM Distance: >3 FB Neck ROM: Full    Dental  (+) Dental Advisory Given, Chipped,    Pulmonary former smoker,    breath sounds clear to auscultation       Cardiovascular hypertension, Pt. on medications  Rhythm:Regular Rate:Normal     Neuro/Psych PSYCHIATRIC DISORDERS Depression negative neurological ROS     GI/Hepatic Neg liver ROS,  S/p gastric bypass    Endo/Other  Morbid obesity Hyponatremia Hypocalcemia Hypochloremia   Renal/GU Renal InsufficiencyRenal disease     Musculoskeletal negative musculoskeletal ROS (+)   Abdominal (+) + obese,   Peds  Hematology  (+) anemia ,   Anesthesia Other Findings   Reproductive/Obstetrics                            Anesthesia Physical Anesthesia Plan  ASA: III  Anesthesia Plan: MAC   Post-op Pain Management:    Induction: Intravenous  PONV Risk Score and Plan: 2 and Propofol infusion and Treatment may vary due to age or medical condition  Airway Management Planned: Nasal Cannula and Natural Airway  Additional Equipment: None  Intra-op Plan:   Post-operative Plan:   Informed Consent: I have reviewed the patients History and Physical, chart, labs and discussed the procedure including the risks, benefits and alternatives for the proposed anesthesia with the patient or authorized representative who has indicated his/her understanding and acceptance.   Patient has DNR.  Discussed DNR with patient and Suspend DNR.     Plan Discussed with: CRNA and Anesthesiologist  Anesthesia Plan Comments:        Anesthesia Quick Evaluation

## 2018-11-17 NOTE — Op Note (Addendum)
[facility] Patient [name] Procedure Date : 11/17/2018 MRN: [id number] Attending MD: Willaim Rayas. Armbruster , MD Date of Birth: 02/16/1946 CSN: [id number] Age: 73 Admit Type: Inpatient Procedure:                Upper GI endoscopy Indications:              Suspected upper gastrointestinal bleeding - history                            of GI bleeding in 2013 with negative EGD, colon,                            and double balloon enteroscopy, now with suspected                            melena, history of Roux-en-Y - Enteroscopy done                            with pediatric colonoscopy Providers:                Viviann Spare P. Adela Lank, MD, April Holding, RN, Harrington Challenger, Technician Referring MD:              Medicines:                Monitored Anesthesia Care Complications:            No immediate complications. Estimated blood loss:                            Minimal. Estimated Blood Loss:     Estimated blood loss was minimal. Procedure:                Pre-Anesthesia Assessment:                           - Prior to the procedure, a History and Physical                            was performed, and patient medications and                            allergies were reviewed. The patient's tolerance of                            previous anesthesia was also reviewed. The risks                            and benefits of the procedure and the sedation                            options and risks were discussed with the patient.  All questions were answered, and informed consent                            was obtained. Prior Anticoagulants: The patient has                            taken no previous anticoagulant or antiplatelet                            agents. ASA Grade Assessment: III - A patient with                            severe systemic disease. After reviewing the risks                            and  benefits, the patient was deemed in                            satisfactory condition to undergo the procedure.                           After obtaining informed consent, the endoscope was                            passed under direct vision. Throughout the                            procedure, the patient's blood pressure, pulse, and                            oxygen saturations were monitored continuously. The                            PCF-H190DL ([id number] Olympus pediatric colonoscope                            was introduced through the mouth, and advanced to                            the jejunum. The upper GI endoscopy was                            accomplished without difficulty. The patient                            tolerated the procedure well. Scope In: Scope Out: Findings:      Esophagogastric landmarks were identified: the Z-line was found at 40       cm, the gastroesophageal junction was found at 40 cm and the upper       extent of the gastric folds was found at 40 cm from the incisors.      The exam of the esophagus was otherwise normal.      Evidence of a Roux-en-Y gastrojejunostomy was found. The gastrojejunal       anastomosis  was characterized by a linear ulcer, roughly 5mm in length,       with a small pigmented area but no visible vessel or high risk stigmata       for bleeding. There was no blood noted anywhere during the exam. The       gastric pouch was small.      Mildly erythematous mucosa was found in the gastric poouch, which was       small. Biopsies were taken with a cold forceps for Helicobacter pylori       testing.      The examined small bowel limb was deeply intubated and was normal. Impression:               - Esophagogastric landmarks identified.                           - Normal esophagus                           - Roux-en-Y gastrojejunostomy with gastrojejunal                            anastomosis characterized by ulceration as                             outlined, I suspect this is the cause of the                            patient's symptoms.                           - Erythematous mucosa in the gastric pouch.                            Biopsied to rule out H pylori.                           - Normal examined small bowel limb Recommendation:           - Return patient to hospital ward for ongoing care.                           - Full liquid diet now and then advance as                            tolerated.                           - Continue present medications - IV protonix 40mg                             BID while inpatient                           - Once discharged, transition to oral omeprazole                            40mg  capsules - the capsules  should be broken open                            and contents swallowed with something soft to                            maximize absorption                           - Await pathology results, treat H pylori if                            positive                           - GI service will reassess the patient tomorrow. Procedure Code(s):        --- Professional ---                           [phone], Esophagogastroduodenoscopy, flexible,                            transoral; with biopsy, single or multiple Diagnosis Code(s):        --- Professional ---                           Z98.0, Intestinal bypass and anastomosis status                           K31.89, Other diseases of stomach and duodenum CPT copyright 2018 American [employer] Association. All rights reserved. The codes documented in this report are preliminary and upon coder review may  be revised to meet current compliance requirements. Viviann Spare P. Armbruster, MD 11/17/2018 9:49:11 AM This report has been signed electronically. Number of Addenda: 0

## 2018-11-18 ENCOUNTER — Encounter (HOSPITAL_COMMUNITY): Payer: Self-pay | Admitting: Gastroenterology

## 2018-11-18 DIAGNOSIS — K254 Chronic or unspecified gastric ulcer with hemorrhage: Secondary | ICD-10-CM

## 2018-11-18 LAB — CBC
HEMATOCRIT: 26.4 % — AB (ref 36.0–46.0)
Hemoglobin: 8.7 g/dL — ABNORMAL LOW (ref 12.0–15.0)
MCH: 29.4 pg (ref 26.0–34.0)
MCHC: 33 g/dL (ref 30.0–36.0)
MCV: 89.2 fL (ref 80.0–100.0)
NRBC: 0 % (ref 0.0–0.2)
PLATELETS: 447 10*3/uL — AB (ref 150–400)
RBC: 2.96 MIL/uL — ABNORMAL LOW (ref 3.87–5.11)
RDW: 15.4 % (ref 11.5–15.5)
WBC: 13.4 10*3/uL — ABNORMAL HIGH (ref 4.0–10.5)

## 2018-11-18 LAB — BASIC METABOLIC PANEL
Anion gap: 10 (ref 5–15)
BUN: 21 mg/dL (ref 8–23)
CHLORIDE: 107 mmol/L (ref 98–111)
CO2: 19 mmol/L — AB (ref 22–32)
CREATININE: 0.9 mg/dL (ref 0.44–1.00)
Calcium: 8.2 mg/dL — ABNORMAL LOW (ref 8.9–10.3)
GFR calc Af Amer: >60 mL/min (ref >60)
GFR calc non Af Amer: >60 mL/min (ref >60)
Glucose, Bld: 106 mg/dL — ABNORMAL HIGH (ref 70–99)
Potassium: 4.2 mmol/L (ref 3.5–5.1)
Sodium: 136 mmol/L (ref 135–145)

## 2018-11-18 NOTE — Progress Notes (Signed)
[facility]    NEPHROLOGY PROGRESS NOTE  SUBJECTIVE: EGD yesterday with anastamotic (roux en y) ulcer.  She is feeling stronger and thinks D/C soon - looks like pending SNF insurance auth.  I/Os yesterday 2.6 / 1.6.    OBJECTIVE:  Vitals:   11/17/18 2211 11/18/18 0426  BP: 135/68 (!) 145/65  Pulse: (!) 102 93  Resp:    Temp:  97.6 F (36.4 C)  SpO2:  96%    Intake/Output Summary (Last 24 hours) at 11/18/2018 1212 Last data filed at 11/18/2018 0500 Gross per 24 hour  Intake 1825.66 ml  Output 1600 ml  Net 225.66 ml      Genearl:  AAOx3 NAD HEENT: MMM [place] AT anicteric sclera Neck:  No JVD CV:  Heart RRR  Lungs:  L/S CTA bilaterally Abd:  abd SNT/ND with normal BS GU:  Bladder non-palpable Extremities: no edema Skin:  No skin rash  MEDICATIONS:  . azithromycin  500 mg Oral Q24H  . bisacodyl  10 mg Rectal Once  . diltiazem  360 mg Oral Daily  . furosemide  20 mg Oral Daily  . guaiFENesin  1,200 mg Oral BID  . lisinopril  5 mg Oral Daily  . metoprolol tartrate  75 mg Oral BID  . mirtazapine  45 mg Oral QHS  . pantoprazole (PROTONIX) IV  40 mg Intravenous Q12H  . sodium chloride flush  3 mL Intravenous Q12H  . sodium chloride flush  3 mL Intravenous Q12H  . sodium chloride  2 g Oral BID WC  . venlafaxine  50 mg Oral BID WC       LABS:   CBC Latest Ref Rng & Units 11/18/2018 11/17/2018 11/17/2018  WBC 4.0 - 10.5 K/uL 13.4(H) 14.6(H) 14.6(H)  Hemoglobin 12.0 - 15.0 g/dL 1.6(X) 0.9(U) 10.0(L)  Hematocrit 36.0 - 46.0 % 26.4(L) 28.2(L) 30.8(L)  Platelets 150 - 400 K/uL 447(H) 454(H) 421(H)    CMP Latest Ref Rng & Units 11/18/2018 11/17/2018 11/16/2018  Glucose 70 - 99 mg/dL 045(W) 098(J) 191(Y)  BUN 8 - 23 mg/dL 21 78(G) 95(A)  Creatinine 0.44 - 1.00 mg/dL 2.13 0.86 5.78(I)  Sodium 135 - 145 mmol/L 136 138 129(L)  Potassium 3.5 - 5.1 mmol/L 4.2 4.2 4.8  Chloride 98 - 111 mmol/L 107 110 97(L)  CO2 22 - 32 mmol/L 19(L) 21(L) 20(L)  Calcium 8.9 - 10.3  mg/dL 8.2(L) 8.3(L) 8.0(L)  Total Protein 6.5 - 8.1 g/dL - - -  Total Bilirubin 0.3 - 1.2 mg/dL - - -  Alkaline Phos 38 - 126 U/L - - -  AST 15 - 41 U/L - - -  ALT 0 - 44 U/L - - -    Lab Results  Component Value Date   CALCIUM 8.2 (L) 11/18/2018   CAION 1.25 11/04/2018       Component Value Date/Time   COLORURINE YELLOW 11/04/2018 1701   APPEARANCEUR CLEAR 11/04/2018 1701   LABSPEC 1.015 11/04/2018 1701   PHURINE 5.0 11/04/2018 1701   GLUCOSEU NEGATIVE 11/04/2018 1701   HGBUR NEGATIVE 11/04/2018 1701   BILIRUBINUR NEGATIVE 11/04/2018 1701   KETONESUR NEGATIVE 11/04/2018 1701   PROTEINUR NEGATIVE 11/04/2018 1701   NITRITE NEGATIVE 11/04/2018 1701   LEUKOCYTESUR NEGATIVE 11/04/2018 1701      Component Value Date/Time   PHART 7.355 11/04/2018 2148   PCO2ART 39.4 11/04/2018 2148   PO2ART 141.0 (H) 11/04/2018 2148   HCO3 21.9 11/04/2018 2148   TCO2 23 11/04/2018 2148   ACIDBASEDEF 3.0 (  H) 11/04/2018 2148   O2SAT 99.0 11/04/2018 2148       Component Value Date/Time   IRON 12 (L) 11/05/2018 0400   TIBC 311 11/05/2018 0400   FERRITIN 136 11/05/2018 0400   IRONPCTSAT 4 (L) 11/05/2018 0400       ASSESSMENT/PLAN:     1.  Hyponatremia.  Urine sodium 14 with a urine osmolality of 611, arguing against SIADH.  Urine osmolality has continued to be elevated on 3 occasions.  Suspect this was hydrochlorothiazide given its relatively long half-life in combination with low oral intake.  Serum sodium normal and discharge pending.  Discontinue NS, salt tabs and standing lasix.    Rec if discharged today or tomorrow lasix 20mg  PRN (She was taking HCTZ prn).   Check labs in 1 week.   2.  Influenza.  Per primary team.  3.  Atrial fibrillation.  Rate control has improved.  4.  GIB. anastamotic ulcer per report, on BID PPI  OK to discharge from nephrology perspective. Will sign off please call with questions or concerns.   Primary can follow up sodium after discharge and refer to  nephrology if problematic.  I expect this was a transient issue from thiazide, flu, poor po intake and will not recur.   Estill Bakes MD [facility] Pager [phone]

## 2018-11-18 NOTE — Social Work (Signed)
[facility] working on updating [employer] authorization. Original auth lapsed, as patient did not discharge when expected. Will need updated therapy notes today. CSW to follow.  Abigail Butts, LCSW [phone]

## 2018-11-18 NOTE — Progress Notes (Addendum)
Physical Therapy Treatment Patient Details [name] MRN: [id number] DOB: 10/23/1945 Today's Date: 11/18/2018    History of Present Illness Pt is a 73 y/o female who presents with cough, sore throat, SOB. Admitted with acute respiratory failure due to + flu A, PNA complicated by sepsis and A-fib. CTA chest- patchy bil opacities suspicious for infectious or inflammatory PNA. Pt with melena; s/p EGD 2/11. PMH includes HTN, depression, hypercholesteremia.   PT Comments    Pt progressing with mobility. Today's session focused on core and LE strengthening with prolonged sitting/standing and repeated sit<>stands; min guard to minA for mobility with RW. Pt requiring multiple, prolonged seated rest breaks during activity. DOE up to 3/4; SpO2 98% on RA. Reinforced incentive spirometry; pt able to demonstrate correct technique. Pt remains motivated for SNF-level therapies; continue to recommend SNF to maximize functional mobility and strength prior to return home.   Follow Up Recommendations  SNF;Supervision for mobility/OOB     Equipment Recommendations  3in1 (PT)    Recommendations for Other Services       Precautions / Restrictions Precautions Precautions: Fall Restrictions Weight Bearing Restrictions: No    Mobility  Bed Mobility Overal bed mobility: Needs Assistance Bed Mobility: Supine to Sit     Supine to sit: Supervision;HOB elevated     General bed mobility comments: Increased time and effort requiring prolonged rest break once sitting EOB; DOE 2/4  Transfers Overall transfer level: Needs assistance Equipment used: Rolling walker (2 wheeled) Transfers: Sit to/from Stand Sit to Stand: Min assist;Min guard         General transfer comment: Performed 3x sit<>stand from recliner to RW, heavy reliance on BUE support and momentum to push into standing; min guard to minA for balance. Prolonged seated rest breaks between standing trials; SpO2 98% on  RA  Ambulation/Gait Ambulation/Gait assistance: Min guard Gait Distance (Feet): 3 Feet Assistive device: Rolling walker (2 wheeled) Gait Pattern/deviations: Step-through pattern;Wide base of support;Decreased stride length Gait velocity: Decreased Gait velocity interpretation: <1.31 ft/sec, indicative of household ambulator General Gait Details: Slow, labored gait with RW and close min guard for balance; DOE 3/4, SpO2 98% on RA   Stairs             Wheelchair Mobility    Modified Rankin (Stroke Patients Only)       Balance Overall balance assessment: Needs assistance Sitting-balance support: Feet supported;No upper extremity supported Sitting balance-Leahy Scale: Fair     Standing balance support: During functional activity;Bilateral upper extremity supported Standing balance-Leahy Scale: Poor Standing balance comment: Reliant on UE support                            Cognition Arousal/Alertness: Awake/alert Behavior During Therapy: Flat affect Overall Cognitive Status: Within Functional Limits for tasks assessed                                        Exercises      General Comments General comments (skin integrity, edema, etc.): Bowel incontinence with sit<>stands, dependent for pericare      Pertinent Vitals/Pain Pain Assessment: Faces Faces Pain Scale: Hurts little more Pain Location: Chronic back  Pain Descriptors / Indicators: Discomfort;Constant Pain Intervention(s): Monitored during session;Limited activity within patient's tolerance    Home Living  Prior Function            PT Goals (current goals can now be found in the care plan section) Acute Rehab PT Goals Patient Stated Goal: Feel better; does not feel safe to return home PT Goal Formulation: With patient Time For Goal Achievement: 11/20/18 Potential to Achieve Goals: Fair Progress towards PT goals: Progressing toward goals     Frequency    Min 2X/week      PT Plan Current plan remains appropriate    Co-evaluation              AM-PAC PT "6 Clicks" Mobility   Outcome Measure  Help needed turning from your back to your side while in a flat bed without using bedrails?: A Little Help needed moving from lying on your back to sitting on the side of a flat bed without using bedrails?: A Little Help needed moving to and from a bed to a chair (including a wheelchair)?: A Little Help needed standing up from a chair using your arms (e.g., wheelchair or bedside chair)?: A Little Help needed to walk in hospital room?: A Lot Help needed climbing 3-5 steps with a railing? : Total 6 Click Score: 15    End of Session Equipment Utilized During Treatment: Gait belt Activity Tolerance: Patient tolerated treatment well;Patient limited by fatigue Patient left: in chair;with call bell/phone within reach Nurse Communication: Mobility status PT Visit Diagnosis: Difficulty in walking, not elsewhere classified (R26.2)     Time: [id number] PT Time Calculation (min) (ACUTE ONLY): 24 min  Charges:  $Therapeutic Exercise: 8-22 mins $Therapeutic Activity: 8-22 mins                    Ina HomesJaclyn Evee Liska, PT, DPT Acute Rehabilitation Services  Pager [phone] Office [phone]  Malachy ChamberJaclyn L Helmi Hechavarria 11/18/2018, 3:21 PM

## 2018-11-18 NOTE — Progress Notes (Signed)
Daily Rounding Note  11/18/2018, 9:32 AM  LOS: 13 days   SUBJECTIVE:   Chief complaint: black stool, anemia.  Anastomotic ulcer    Feels well.  No nausea.  BM last night still black.    OBJECTIVE:         Vital signs in last 24 hours:    Temp:  [97.5 F (36.4 C)-97.8 F (36.6 C)] 97.6 F (36.4 C) (02/12 0426) Pulse Rate:  [76-106] 93 (02/12 0426) Resp:  [20-28] 20 (02/11 1320) BP: (92-145)/(36-94) 145/65 (02/12 0426) SpO2:  [96 %-99 %] 96 % (02/12 0426) Weight:  [758 kg] 115 kg (02/12 0426) Last BM Date: 11/14/18 Filed Weights   11/17/18 0532 11/17/18 0914 11/18/18 0426  Weight: 114.9 kg 114.9 kg 115 kg   General: obese, pale, looks unhealthy but not acutely ill   Heart: RRR Chest: clear bil.  No cough or SOB Abdomen: soft, NT, ND.  Active BS.  obese  Extremities: no CCE Neuro/Psych:  Oriented x 3.  No gross deficits.  No tremors.    Intake/Output from previous day: 02/11 0701 - 02/12 0700 In: 2487.8 [P.O.:720; I.V.:1667.8; IV Piggyback:100] Out: 1600 [Urine:1600]  Intake/Output this shift: No intake/output data recorded.  Lab Results: Recent Labs    11/17/18 0831 11/17/18 1430 11/18/18 0340  WBC 14.6* 14.6* 13.4*  HGB 10.0* 9.4* 8.7*  HCT 30.8* 28.2* 26.4*  PLT 421* 454* 447*   BMET Recent Labs    11/16/18 0706 11/17/18 1430 11/18/18 0340  NA 129* 138 136  K 4.8 4.2 4.2  CL 97* 110 107  CO2 20* 21* 19*  GLUCOSE 116* 110* 106*  BUN 39* 28* 21  CREATININE 1.07* 0.80 0.90  CALCIUM 8.0* 8.3* 8.2*   LFT No results for input(s): PROT, ALBUMIN, AST, ALT, ALKPHOS, BILITOT, BILIDIR, IBILI in the last 72 hours. PT/INR No results for input(s): LABPROT, INR in the last 72 hours. Hepatitis Panel No results for input(s): HEPBSAG, HCVAB, HEPAIGM, HEPBIGM in the last 72 hours.  Studies/Results: No results found.   Scheduled Meds: . azithromycin  500 mg Oral Q24H  . bisacodyl  10 mg Rectal Once   . diltiazem  360 mg Oral Daily  . furosemide  20 mg Oral Daily  . guaiFENesin  1,200 mg Oral BID  . lisinopril  5 mg Oral Daily  . metoprolol tartrate  75 mg Oral BID  . mirtazapine  45 mg Oral QHS  . pantoprazole (PROTONIX) IV  40 mg Intravenous Q12H  . sodium chloride flush  3 mL Intravenous Q12H  . sodium chloride flush  3 mL Intravenous Q12H  . sodium chloride  2 g Oral BID WC  . venlafaxine  50 mg Oral BID WC   Continuous Infusions: . sodium chloride    . sodium chloride 75 mL/hr at 11/18/18 0500  . cefTRIAXone (ROCEPHIN)  IV 1 g (11/17/18 1736)   PRN Meds:.sodium chloride, acetaminophen **OR** acetaminophen, benzonatate, bisacodyl, hydrALAZINE, ipratropium-albuterol, LORazepam, morphine injection, ondansetron (ZOFRAN) IV, polyethylene glycol, sodium chloride flush, temazepam, traMADol   ASSESMENT:   *GI bleed, melena. EGD 11/17/2018.  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  No blood present.  Mild erythema noted in the small gastric pouch, biopsies obtained. 2013 EGD of unclear cause.  EGD, colon, dbl balloon enteroscopy then at [facility].   IV BID PPI in place.  On BID PO Protonix at home which raises ? Of ischemic component to the ulcer.     *    Blood loss anemia.  S/p 2 PRBC on 2/10.  On oral iron at home.  *   CAP. + influenza A.  Rocephin and Zithromax in place. Week 2 admission.    *    Hyponatremia.  Renal following.  Ruled out for SIADH.    *   AFIB RVR.  Initially treated with apixaban, now discontinued.  On metoprolol, Cardizem.  *   AKI resolved.     PLAN   *   While inpatient continue the Protonix 40 mg IV twice daily.  Transition to Protonix 40 mg p.o. twice daily at discharge or in 24 hours.  Await path results.        Jennye Moccasin  11/18/2018, 9:32 AM Phone [phone]

## 2018-11-18 NOTE — Progress Notes (Signed)
PROGRESS NOTE    Adrienne Stone  ZOX:[id number] DOB: April 18, 1946 DOA: 11/04/2018 PCP: Darryl Lentaylor, Amanda, PA-C   Brief Narrative: Patient is a 73 year old female who presented here with complaints of chills, cough, fatigue, nausea and vomiting.  She has history of hypertension, depression .  She was found to have influenza infection presentation.  Also developed atrial fibrillation with RVR.  Hospital course remarkable for upper GI bleed.  Underwent EGD by GI.  Also seen by nephrology for hyponatremia.  Overall status has stabilized.  Plan is to discharge to skilled facility soon as the bed is available.  Assessment & Plan:   Principal Problem:   Influenza A Active Problems:   Acute respiratory failure with hypoxia (HCC)   Acute respiratory distress   Hypertensive urgency   Anemia   Atrial fibrillation with RVR (HCC)   Melena   Acute gastric ulcer with hemorrhage  Hyponatremia: Sodium of 136 today.  Nephrology was following.  Suspected to be from hydrochlorothiazide.  She was started on salt tablets, Lasix and IV fluids.  All of these have been discontinued.  Nephrology recommended to do BMP in a week.  Nephrology recommended to take Lasix 20 mg as needed when she takes hydrochlorothiazide.  Influenza A: Currently respiratory status stable.  Complete Tamiflu course.  A. fib with RVR: New onset.  Currently rate is controlled.  Cardiology was following.  Cardiology recommending to continue metoprolol, Cardizem and follow-up as an outpatient.  Not started on anticoagulation.  Acute respiratory failure with hypoxia: Secondary to influenza infection.  CT scan showed infiltrates likely due to influenza.  Currently she is doing fine on room air.  Hypertension: Currently blood pressure stable.  Continue current medications.  Depression/anxiety: Continue current medications.  She is on mirtazapine, meloxicam, Lorazepam  Hypokalemia: Supplemented with potassium.  Suspected multifocal  pneumonia: Had productive cough, elevated white cell counts, worsening chest x-ray.  There was concern for bacterial pneumonia.  MRSA PCR was negative.  Procalcitonin was undetectable.  Patient has improved clinically.  Currently on ceftriaxone ,azithromycin.  We will continue oral antibiotics on discharge.  Upper GI bleed/melena: Underwent EGD on 11/17/2018 with finding of ulcer at gastrojejunal anastomosis but no visible vessel or stigmata for high risk of bleeding.  Recommend Protonix twice daily on discharge.  Continue IV Protonix while inpatient.  Currently hemoglobin stable.  Diet advanced today.         DVT prophylaxis: SCD Code Status: Full Family Communication: None present at the bedside Disposition Plan: Skilled nursing facility as soon as the bed is available   Consultants: Cardiology, GI  Procedures: EGD  Antimicrobials:  Anti-infectives (From admission, onward)   Start     Dose/Rate Route Frequency Ordered Stop   11/15/18 1700  cefTRIAXone (ROCEPHIN) 1 g in sodium chloride 0.9 % 100 mL IVPB     1 g 200 mL/hr over 30 Minutes Intravenous Every 24 hours 11/15/18 1622 11/22/18 1659   11/15/18 1700  azithromycin (ZITHROMAX) tablet 500 mg     500 mg Oral Every 24 hours 11/15/18 1622 11/22/18 1659   11/05/18 0330  oseltamivir (TAMIFLU) capsule 30 mg     30 mg Oral 2 times daily 11/05/18 0316 11/09/18 2159   11/04/18 1915  cefTRIAXone (ROCEPHIN) 1 g in sodium chloride 0.9 % 100 mL IVPB     1 g 200 mL/hr over 30 Minutes Intravenous  Once 11/04/18 1911 11/04/18 1950   11/04/18 1900  cefTRIAXone (ROCEPHIN) injection 50 mg/kg  Status:  Discontinued  50 mg/kg Intramuscular  Once 11/04/18 1857 11/04/18 1911   11/04/18 1900  azithromycin (ZITHROMAX) tablet 500 mg     500 mg Oral  Once 11/04/18 1857 11/04/18 1905      Subjective: Patient seen and examined the bedside this morning.  Remains comfortable.  Hemodynamically stable.  Respiratory status stable.  Had dark bowel  movement last night but her hemoglobin is stable.  Complains of back pain.  Objective: Vitals:   11/17/18 1520 11/17/18 2019 11/17/18 2211 11/18/18 0426  BP: (!) 132/39 (!) 139/59 135/68 (!) 145/65  Pulse: 89 (!) 106 (!) 102 93  Resp:      Temp:  (!) 97.5 F (36.4 C)  97.6 F (36.4 C)  TempSrc:  Oral  Oral  SpO2: 97% 96%  96%  Weight:    115 kg  Height:        Intake/Output Summary (Last 24 hours) at 11/18/2018 1458 Last data filed at 11/18/2018 1228 Gross per 24 hour  Intake 1705.66 ml  Output 1850 ml  Net -144.34 ml   Filed Weights   11/17/18 0532 11/17/18 0914 11/18/18 0426  Weight: 114.9 kg 114.9 kg 115 kg    Examination:  General exam: Appears calm and comfortable ,Not in distress,obese HEENT:PERRL,Oral mucosa moist, Ear/Nose normal on gross exam Respiratory system: Bilateral equal air entry, normal vesicular breath sounds, no wheezes or crackles  Cardiovascular system: S1 & S2 heard, RRR. No JVD, murmurs, rubs, gallops or clicks. Trace pedal edema. Gastrointestinal system: Abdomen is nondistended, soft and nontender. No organomegaly or masses felt. Normal bowel sounds heard. Central nervous system: Alert and oriented. No focal neurological deficits. Extremities:trace edema, no clubbing ,no cyanosis, distal peripheral pulses palpable. Skin: No rashes, lesions or ulcers,no icterus ,no pallor MSK: Normal muscle bulk,tone ,power Psychiatry: Judgement and insight appear normal. Mood & affect appropriate.     Data Reviewed: I have personally reviewed following labs and imaging studies  CBC: Recent Labs  Lab 11/16/18 2010 11/17/18 0257 11/17/18 0831 11/17/18 1430 11/18/18 0340  WBC 15.3* 16.8* 14.6* 14.6* 13.4*  HGB 8.4* 10.4* 10.0* 9.4* 8.7*  HCT 24.9* 31.0* 30.8* 28.2* 26.4*  MCV 88.6 87.8 88.3 88.4 89.2  PLT 499* 432* 421* 454* 447*   Basic Metabolic Panel: Recent Labs  Lab 11/15/18 1016 11/15/18 1615 11/16/18 0706 11/17/18 1430 11/18/18 0340  NA  122* 123* 129* 138 136  K 4.2 4.8 4.8 4.2 4.2  CL 89* 92* 97* 110 107  CO2 22 18* 20* 21* 19*  GLUCOSE 191* 122* 116* 110* 106*  BUN 42* 44* 39* 28* 21  CREATININE 1.10* 1.01* 1.07* 0.80 0.90  CALCIUM 8.3* 8.0* 8.0* 8.3* 8.2*   GFR: Estimated Creatinine Clearance: 67 mL/min (by C-G formula based on SCr of 0.9 mg/dL). Liver Function Tests: No results for input(s): AST, ALT, ALKPHOS, BILITOT, PROT, ALBUMIN in the last 168 hours. No results for input(s): LIPASE, AMYLASE in the last 168 hours. No results for input(s): AMMONIA in the last 168 hours. Coagulation Profile: No results for input(s): INR, PROTIME in the last 168 hours. Cardiac Enzymes: No results for input(s): CKTOTAL, CKMB, CKMBINDEX, TROPONINI in the last 168 hours. BNP (last 3 results) No results for input(s): PROBNP in the last 8760 hours. HbA1C: No results for input(s): HGBA1C in the last 72 hours. CBG: Recent Labs  Lab 11/12/18 0741 11/13/18 0837 11/14/18 0737 11/15/18 0748 11/17/18 0741  GLUCAP 126* 198* 130* 113* 98   Lipid Profile: No results for input(s): CHOL, HDL, LDLCALC,  TRIG, CHOLHDL, LDLDIRECT in the last 72 hours. Thyroid Function Tests: No results for input(s): TSH, T4TOTAL, FREET4, T3FREE, THYROIDAB in the last 72 hours. Anemia Panel: No results for input(s): VITAMINB12, FOLATE, FERRITIN, TIBC, IRON, RETICCTPCT in the last 72 hours. Sepsis Labs: Recent Labs  Lab 11/15/18 1016 11/16/18 0442 11/17/18 0831  PROCALCITON 0.10 0.11 <0.10    Recent Results (from the past 240 hour(s))  Culture, blood (routine x 2) Call MD if unable to obtain prior to antibiotics being given     Status: None (Preliminary result)   Collection Time: 11/15/18  4:33 PM  Result Value Ref Range Status   Specimen Description BLOOD RIGHT HAND  Final   Special Requests   Final    BOTTLES DRAWN AEROBIC ONLY Blood Culture results may not be optimal due to an inadequate volume of blood received in culture bottles   Culture    Final    NO GROWTH 3 DAYS Performed at [facility], [address] [place], [place] [address]    Report Status PENDING  Incomplete  Culture, blood (routine x 2) Call MD if unable to obtain prior to antibiotics being given     Status: None (Preliminary result)   Collection Time: 11/15/18  4:40 PM  Result Value Ref Range Status   Specimen Description BLOOD LEFT ARM  Final   Special Requests   Final    BOTTLES DRAWN AEROBIC ONLY Blood Culture results may not be optimal due to an inadequate volume of blood received in culture bottles   Culture   Final    NO GROWTH 3 DAYS Performed at [facility], [address] [place], [place] [address]    Report Status PENDING  Incomplete  MRSA PCR Screening     Status: None   Collection Time: 11/15/18  5:23 PM  Result Value Ref Range Status   MRSA by PCR NEGATIVE NEGATIVE Final    Comment:        The GeneXpert MRSA Assay (FDA approved for NASAL specimens only), is one component of a comprehensive MRSA colonization surveillance program. It is not intended to diagnose MRSA infection nor to guide or monitor treatment for MRSA infections. Performed at [facility], [address], [place], [place] [address]          Radiology Studies: No results found.      Scheduled Meds: . azithromycin  500 mg Oral Q24H  . bisacodyl  10 mg Rectal Once  . diltiazem  360 mg Oral Daily  . guaiFENesin  1,200 mg Oral BID  . lisinopril  5 mg Oral Daily  . metoprolol tartrate  75 mg Oral BID  . mirtazapine  45 mg Oral QHS  . pantoprazole (PROTONIX) IV  40 mg Intravenous Q12H  . sodium chloride flush  3 mL Intravenous Q12H  . sodium chloride flush  3 mL Intravenous Q12H  . venlafaxine  50 mg Oral BID WC   Continuous Infusions: . sodium chloride    . cefTRIAXone (ROCEPHIN)  IV 1 g (11/17/18 1736)     LOS: 13 days    Time spent: 35 mins.More than 50% of that time was spent in counseling and/or coordination of  care.      Burnadette Pop, MD [facility] Pager [phone]  If 7PM-7AM, please contact night-coverage [email or web address] Password [facility] 11/18/2018, 2:58 PM

## 2018-11-18 NOTE — Care Management Note (Signed)
Case Management Note  Patient Details  [name] MRN: [id number] Date of Birth: 06-Dec-1945  Subjective/Objective: Pt presented for Atrial Fib and Pneumonia. Plan initially for home- now will transition to SNF at [facility].                     Action/Plan: Government social research officer. CSW following for transition of care needs. CM will continue to monitor.   Expected Discharge Date:                  Expected Discharge Plan:  Skilled Nursing Facility  In-House Referral:  Clinical Social Work  Discharge planning Services  CM Consult  Post Acute Care Choice:  NA Choice offered to:  NA  DME Arranged:  N/A DME Agency:  NA  HH Arranged:  NA HH Agency:  [facility], NA  Status of Service:  Completed, signed off  If discussed at Long Length of Stay Meetings, dates discussed:    Additional Comments:  Gala Lewandowsky, RN 11/18/2018, 10:17 AM

## 2018-11-19 LAB — GLUCOSE, CAPILLARY: Glucose-Capillary: 106 mg/dL — ABNORMAL HIGH (ref 70–99)

## 2018-11-19 LAB — CBC
HEMATOCRIT: 25.6 % — AB (ref 36.0–46.0)
Hemoglobin: 8.6 g/dL — ABNORMAL LOW (ref 12.0–15.0)
MCH: 30.4 pg (ref 26.0–34.0)
MCHC: 33.6 g/dL (ref 30.0–36.0)
MCV: 90.5 fL (ref 80.0–100.0)
Platelets: 485 10*3/uL — ABNORMAL HIGH (ref 150–400)
RBC: 2.83 MIL/uL — ABNORMAL LOW (ref 3.87–5.11)
RDW: 15.9 % — ABNORMAL HIGH (ref 11.5–15.5)
WBC: 14.1 10*3/uL — ABNORMAL HIGH (ref 4.0–10.5)
nRBC: 0 % (ref 0.0–0.2)

## 2018-11-19 MED ORDER — LORAZEPAM 1 MG PO TABS: 1.0000 mg | ORAL_TABLET | Freq: Two times a day (BID) | ORAL | 0 refills | Status: DC | PRN

## 2018-11-19 MED ORDER — METOPROLOL TARTRATE 75 MG PO TABS: 75.0000 mg | ORAL_TABLET | Freq: Two times a day (BID) | ORAL | Status: DC

## 2018-11-19 MED ORDER — CEFDINIR 300 MG PO CAPS: 300.0000 mg | ORAL_CAPSULE | Freq: Two times a day (BID) | ORAL | 0 refills | Status: AC

## 2018-11-19 MED ORDER — AZITHROMYCIN 250 MG PO TABS
500.0000 mg | ORAL_TABLET | Freq: Once | ORAL | Status: AC
  Administered 2018-11-19: 500 mg via ORAL
  Filled 2018-11-19: qty 2

## 2018-11-19 MED ORDER — LISINOPRIL 5 MG PO TABS: 5.0000 mg | ORAL_TABLET | Freq: Every day | ORAL | Status: DC

## 2018-11-19 MED ORDER — DILTIAZEM HCL ER COATED BEADS 360 MG PO CP24: 360.0000 mg | ORAL_CAPSULE | Freq: Every day | ORAL | Status: DC

## 2018-11-19 MED ORDER — CEFDINIR 300 MG PO CAPS
300.0000 mg | ORAL_CAPSULE | Freq: Two times a day (BID) | ORAL | Status: DC
  Administered 2018-11-19: 300 mg via ORAL
  Filled 2018-11-19 (×2): qty 1

## 2018-11-19 MED ORDER — OMEPRAZOLE 40 MG PO CPDR: DELAYED_RELEASE_CAPSULE | ORAL | 0 refills | Status: AC

## 2018-11-19 NOTE — Social Work (Signed)
PT notes were sent to [facility] yesterday. Continue to await Drucie Opitz for patient.  Abigail Butts, LCSW [phone]

## 2018-11-19 NOTE — Clinical Social Work Placement (Signed)
   CLINICAL SOCIAL WORK PLACEMENT  NOTE  Date:  11/19/2018  Patient Details  [name] MRN: [id number] Date of Birth: 14-May-1946  Clinical Social Work is seeking post-discharge placement for this patient at the Skilled  Nursing Facility level of care (*CSW will initial, date and re-position this form in  chart as items are completed):  Yes   Patient/family provided with [facility] Clinical Social Work Department's list of facilities offering this level of care within the geographic area requested by the patient (or if unable, by the patient's family).  Yes   Patient/family informed of their freedom to choose among providers that offer the needed level of care, that participate in Medicare, Medicaid or managed care program needed by the patient, have an available bed and are willing to accept the patient.  Yes   Patient/family informed of [facility]'s ownership interest [facility] and [facility], as well as of the fact that they are under no obligation to receive care at these facilities.  PASRR submitted to EDS on 11/12/18     PASRR number received on 11/12/18     Existing PASRR number confirmed on       FL2 transmitted to all facilities in geographic area requested by pt/family on 11/12/18     FL2 transmitted to all facilities within larger geographic area on       Patient informed that his/her managed care company has contracts with or will negotiate with certain facilities, including the following:  [facility]     Yes   Patient/family informed of bed offers received.  [facility]     Physician recommends and patient chooses bed at      [facility].  Patient to be transferred to facility by [facility]     Patient family notified on 11/19/18 of transfer.  Name of family member notified:  Lacy DuverneyMariyln Whisnant, sister     PHYSICIAN Please prepare priority discharge summary, including  medications, Please prepare prescriptions, Please sign DNR     Additional Comment:    _______________________________________________ Eduard Rouxynthia N Finnlee Silvernail, LCSWA 11/19/2018, 3:31 PM

## 2018-11-19 NOTE — Progress Notes (Signed)
Report called to Rita,RN at [facility].

## 2018-11-19 NOTE — Discharge Summary (Signed)
Physician Discharge Summary  Adrienne Stone IRS:[id number] DOB: Jul 04, 1946 DOA: 11/04/2018  PCP: Darryl Lentaylor, Amanda, PA-C  Admit date: 11/04/2018 Discharge date: 11/19/2018  Admitted From: Home Disposition:  Home  Discharge Condition:Stable CODE STATUS:FULL Diet recommendation: Heart Healthy  Brief/Interim Summary: Patient is a 73 year old female who presented here with complaints of chills, cough, fatigue, nausea and vomiting.  She has history of hypertension, depression .  She was found to have influenza infection presentation.  Also developed atrial fibrillation with RVR.  Hospital course remarkable for upper GI bleed.  Underwent EGD by GI.  Also seen by nephrology for hyponatremia.  Overall status has stabilized.  She is stable for discharge to skilled facility today.  Following problems were addressed during her hospitalization:  Hyponatremia: Last Sodium of 136 today.  Nephrology was following.  Suspected to be from hydrochlorothiazide.  She was started on salt tablets, Lasix and IV fluids.  All of these have been discontinued.  Nephrology recommended to do BMP in a week.   Influenza A: Currently respiratory status stable.  Completed Tamiflu course.  A. fib with RVR: New onset.  Currently rate is controlled.  Cardiology was following.  Cardiology recommending to continue metoprolol, Cardizem and follow-up as an outpatient.  Not started on anticoagulation.  Acute respiratory failure with hypoxia: Secondary to influenza infection.  CT scan showed infiltrates likely due to influenza.  Currently she is doing fine on room air.  Hypertension: Currently blood pressure stable.  Continue current medications as recommended.  Depression/anxiety: Continue current medications.  She is on mirtazapine, Lorazepam  Hypokalemia: Supplemented with potassium.  Suspected multifocal pneumonia: Had productive cough, elevated white cell counts, worsening chest x-ray.  There was concern for  bacterial pneumonia.  MRSA PCR was negative.  Procalcitonin was undetectable.  Patient has improved clinically.  Was on ceftriaxone ,azithromycin.  We will continue oral antibiotics on discharge.  Upper GI bleed/melena: Underwent EGD on 11/17/2018 with finding of ulcer at gastrojejunal anastomosis but no visible vessel or stigmata for high risk of bleeding.  Recommend omeprazole  twice daily on discharge.  .Currently hemoglobin stable.  Diet advanced and she is tolerating.Check CBC in a week.  Discharge Diagnoses:  Principal Problem:   Influenza A Active Problems:   Acute respiratory failure with hypoxia (HCC)   Acute respiratory distress   Hypertensive urgency   Anemia   Atrial fibrillation with RVR (HCC)   Melena   Gastric ulcer with hemorrhage    Discharge Instructions  Discharge Instructions    Diet - low sodium heart healthy   Complete by:  As directed    Discharge instructions   Complete by:  As directed    1) Take prescribed medications as instructed. 2) Do CBC and BMP tests in a week.   Increase activity slowly   Complete by:  As directed      Allergies as of 11/19/2018      Reactions   Demerol [meperidine] Rash   Penicillins Rash   Prozac [fluoxetine Hcl] Palpitations   Serzone [nefazodone] Rash   Sulfa Antibiotics Rash      Medication List    STOP taking these medications   acetaminophen-codeine 300-30 MG tablet Commonly known as:  TYLENOL #3   hydrochlorothiazide 12.5 MG capsule Commonly known as:  MICROZIDE   pantoprazole 40 MG tablet Commonly known as:  PROTONIX   temazepam 30 MG capsule Commonly known as:  RESTORIL     TAKE these medications   acetaminophen 650 MG CR tablet Commonly known as:  TYLENOL Take 1,300 mg by mouth every 8 (eight) hours as needed for pain.   CALCIUM 1200 PO Take by mouth.   cefdinir 300 MG capsule Commonly known as:  OMNICEF Take 1 capsule (300 mg total) by mouth every 12 (twelve) hours for 2 days.   cloNIDine  0.1 MG tablet Commonly known as:  CATAPRES Take 0.1 mg by mouth 2 (two) times daily.   diltiazem 360 MG 24 hr capsule Commonly known as:  CARDIZEM CD Take 1 capsule (360 mg total) by mouth daily. Start taking on:  November 20, 2018   escitalopram 10 MG tablet Commonly known as:  LEXAPRO Take 10 mg by mouth daily.   fenofibrate 160 MG tablet Take 160 mg by mouth daily.   ferrous sulfate 325 (65 FE) MG tablet Take 325 mg by mouth daily with breakfast.   lisinopril 5 MG tablet Commonly known as:  PRINIVIL,ZESTRIL Take 1 tablet (5 mg total) by mouth daily. Start taking on:  November 20, 2018 What changed:    medication strength  how much to take   loratadine 10 MG tablet Commonly known as:  CLARITIN Take 10 mg by mouth daily.   LORazepam 1 MG tablet Commonly known as:  ATIVAN Take 1 tablet (1 mg total) by mouth 2 (two) times daily as needed for anxiety.   Magnesium 250 MG Tabs Take by mouth.   Metoprolol Tartrate 75 MG Tabs Take 75 mg by mouth 2 (two) times daily. What changed:    medication strength  how much to take   mirtazapine 45 MG tablet Commonly known as:  REMERON Take 45 mg by mouth at bedtime.   multivitamin capsule Take 1 capsule by mouth daily.   multivitamin-lutein Caps capsule Take 1 capsule by mouth daily.   omeprazole 40 MG capsule Commonly known as:  PRILOSEC crack open and ingest contents to maximize absorption of PPI given your gastric bypass state   QUEtiapine 25 MG tablet Commonly known as:  SEROQUEL Take 50 mg by mouth at bedtime. Take 2 tablets (50mg ) at bedtime   venlafaxine 50 MG tablet Commonly known as:  EFFEXOR Take 50 mg by mouth 2 (two) times daily.   vitamin C 1000 MG tablet Take 1,000 mg by mouth daily.            Durable Medical Equipment  (From admission, onward)         Start     Ordered   11/11/18 1101  For home use only DME 3 n 1  Once     11/11/18 1101         Contact information for  after-discharge care    Destination    [facility] .   Service:  Skilled Nursing Contact information: [address] [place] [id number] [phone]             Allergies  Allergen Reactions  . Demerol [Meperidine] Rash  . Penicillins Rash  . Prozac [Fluoxetine Hcl] Palpitations  . Serzone [Nefazodone] Rash  . Sulfa Antibiotics Rash    Consultations:  Nephrology, GI   Procedures/Studies: Dg Chest 2 View  Result Date: 11/04/2018 CLINICAL DATA:  Site cough and flu-like symptoms for 24 hours. EXAM: CHEST - 2 VIEW COMPARISON:  None. FINDINGS: The heart size and mediastinal contours are within normal limits. Coarse prominence of interstitial lung markings is consistent with chronic interstitial disease. No evidence of acute infiltrate or pleural effusion. Eventration of the anterior right hemidiaphragm is noted. Thoracic  spine degenerative changes are noted as well as wedge compression fracture deformity of the L1 vertebral body. IMPRESSION: No active cardiopulmonary disease. Electronically Signed   By: Myles Rosenthal M.D.   On: 11/04/2018 15:41   Ct Angio Chest Pe W/cm &/or Wo Cm  Result Date: 11/04/2018 CLINICAL DATA:  Shortness of breath EXAM: CT ANGIOGRAPHY CHEST WITH CONTRAST TECHNIQUE: Multidetector CT imaging of the chest was performed using the standard protocol during bolus administration of intravenous contrast. Multiplanar CT image reconstructions and MIPs were obtained to evaluate the vascular anatomy. CONTRAST:  ISOVUE-370 IOPAMIDOL (ISOVUE-370) INJECTION 76% COMPARISON:  None. FINDINGS: Cardiovascular: Heart is borderline in size. Aorta is normal caliber. No filling defects in the pulmonary arteries to suggest pulmonary emboli. Scattered coronary artery and aortic calcifications. Mediastinum/Nodes: Borderline sized mediastinal lymph nodes. No hilar or axillary adenopathy. Lungs/Pleura: Scattered ground-glass airspace opacities within  the lungs. Favor inflammatory or infectious pneumonitis. No effusions. Upper Abdomen: Imaging into the upper abdomen shows no acute findings. Musculoskeletal: There are collateral venous channels throughout the left chest wall suggesting central venous stenosis, not visible by CT. Chest wall soft tissues otherwise unremarkable. No acute bony abnormality. Review of the MIP images confirms the above findings. IMPRESSION: No evidence of pulmonary embolus. Patchy bilateral ground-glass opacities in the lungs, most pronounced in the upper lobes. Favor infectious or inflammatory pneumonitis. Borderline heart size. Borderline scattered mediastinal lymph nodes, likely reactive. Coronary artery disease. Aortic Atherosclerosis (ICD10-I70.0). Electronically Signed   By: Charlett Nose M.D.   On: 11/04/2018 21:37   Dg Chest Port 1 View  Result Date: 11/14/2018 CLINICAL DATA:  73 year old female with shortness of breath. Recently admitted for flu and pneumonia EXAM: PORTABLE CHEST 1 VIEW COMPARISON:  Prior chest x-ray 11/04/2018 FINDINGS: Cardiac and mediastinal contours are within normal limits. 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 Overall, inspiratory volumes are slightly lower than previously seen. There is increased patchy airspace opacity in the bilateral lung apices worse on the right than the left. Chronic bronchitic changes and interstitial prominence are similar compared to prior. No acute osseous abnormality. IMPRESSION: 1. Slightly increased patchy airspace opacities in the right greater than left lung apices. There are areas of airspace infiltrate on the prior CT scan from 11/04/2018. This may represent progression of multifocal pneumonia versus residual scarring. 2. Slightly lower inspiratory volumes compared to prior imaging. 3. 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 Electronically Signed   By: Malachy Moan M.D.   On: 11/14/2018 11:39        Subjective: Patient seen and examined at bedside this morning.  Remains comfortable.  Hemodynamically stable for discharge.  Discharge Exam: Vitals:   11/19/18 0525 11/19/18 1336  BP: (!) 162/90 (!) 151/84  Pulse: 84 71  Resp:  20  Temp: 97.7 F (36.5 C) 98 F (36.7 C)  SpO2: 96% 97%   Vitals:   11/18/18 2013 11/18/18 2151 11/19/18 0525 11/19/18 1336  BP: (!) 126/55  (!) 162/90 (!) 151/84  Pulse: (!) 104 (!) 112 84 71  Resp:    20  Temp: (!) 97.5 F (36.4 C)  97.7 F (36.5 C) 98 F (36.7 C)  TempSrc: Oral  Oral Oral  SpO2: 98%  96% 97%  Weight:      Height:        General: Pt is alert, awake, not in acute distress Cardiovascular: RRR, S1/S2 +, no rubs, no gallops Respiratory: CTA bilaterally, no wheezing, no rhonchi Abdominal: Soft, NT, ND,  bowel sounds + Extremities: no edema, no cyanosis    The results of significant diagnostics from this hospitalization (including imaging, microbiology, ancillary and laboratory) are listed below for reference.     Microbiology: Recent Results (from the past 240 hour(s))  Culture, blood (routine x 2) Call MD if unable to obtain prior to antibiotics being given     Status: None (Preliminary result)   Collection Time: 11/15/18  4:33 PM  Result Value Ref Range Status   Specimen Description BLOOD RIGHT HAND  Final   Special Requests   Final    BOTTLES DRAWN AEROBIC ONLY Blood Culture results may not be optimal due to an inadequate volume of blood received in culture bottles   Culture   Final    NO GROWTH 4 DAYS Performed at [facility], [address], [place], [place] [address]    Report Status PENDING  Incomplete  Culture, blood (routine x 2) Call MD if unable to obtain prior to antibiotics being given     Status: None (Preliminary result)   Collection Time: 11/15/18  4:40 PM  Result Value Ref Range Status   Specimen Description BLOOD LEFT ARM  Final   Special Requests   Final    BOTTLES DRAWN AEROBIC ONLY  Blood Culture results may not be optimal due to an inadequate volume of blood received in culture bottles   Culture   Final    NO GROWTH 4 DAYS Performed at [facility], [address], [place], [place] [address]    Report Status PENDING  Incomplete  MRSA PCR Screening     Status: None   Collection Time: 11/15/18  5:23 PM  Result Value Ref Range Status   MRSA by PCR NEGATIVE NEGATIVE Final    Comment:        The GeneXpert MRSA Assay (FDA approved for NASAL specimens only), is one component of a comprehensive MRSA colonization surveillance program. It is not intended to diagnose MRSA infection nor to guide or monitor treatment for MRSA infections. Performed at [facility], [address], [place], [place] [address]      Labs: BNP (last 3 results) No results for input(s): BNP in the last 8760 hours. Basic Metabolic Panel: Recent Labs  Lab 11/15/18 1016 11/15/18 1615 11/16/18 0706 11/17/18 1430 11/18/18 0340  NA 122* 123* 129* 138 136  K 4.2 4.8 4.8 4.2 4.2  CL 89* 92* 97* 110 107  CO2 22 18* 20* 21* 19*  GLUCOSE 191* 122* 116* 110* 106*  BUN 42* 44* 39* 28* 21  CREATININE 1.10* 1.01* 1.07* 0.80 0.90  CALCIUM 8.3* 8.0* 8.0* 8.3* 8.2*   Liver Function Tests: No results for input(s): AST, ALT, ALKPHOS, BILITOT, PROT, ALBUMIN in the last 168 hours. No results for input(s): LIPASE, AMYLASE in the last 168 hours. No results for input(s): AMMONIA in the last 168 hours. CBC: Recent Labs  Lab 11/17/18 0257 11/17/18 0831 11/17/18 1430 11/18/18 0340 11/19/18 0405  WBC 16.8* 14.6* 14.6* 13.4* 14.1*  HGB 10.4* 10.0* 9.4* 8.7* 8.6*  HCT 31.0* 30.8* 28.2* 26.4* 25.6*  MCV 87.8 88.3 88.4 89.2 90.5  PLT 432* 421* 454* 447* 485*   Cardiac Enzymes: No results for input(s): CKTOTAL, CKMB, CKMBINDEX, TROPONINI in the last 168 hours. BNP: Invalid input(s): POCBNP CBG: Recent Labs  Lab 11/13/18 0837 11/14/18 0737 11/15/18 0748 11/17/18 0741 11/19/18 0751   GLUCAP 198* 130* 113* 98 106*   D-Dimer No results for input(s): DDIMER  in the last 72 hours. Hgb A1c No results for input(s): HGBA1C in the last 72 hours. Lipid Profile No results for input(s): CHOL, HDL, LDLCALC, TRIG, CHOLHDL, LDLDIRECT in the last 72 hours. Thyroid function studies No results for input(s): TSH, T4TOTAL, T3FREE, THYROIDAB in the last 72 hours.  Invalid input(s): FREET3 Anemia work up No results for input(s): VITAMINB12, FOLATE, FERRITIN, TIBC, IRON, RETICCTPCT in the last 72 hours. Urinalysis    Component Value Date/Time   COLORURINE YELLOW 11/04/2018 1701   APPEARANCEUR CLEAR 11/04/2018 1701   LABSPEC 1.015 11/04/2018 1701   PHURINE 5.0 11/04/2018 1701   GLUCOSEU NEGATIVE 11/04/2018 1701   HGBUR NEGATIVE 11/04/2018 1701   BILIRUBINUR NEGATIVE 11/04/2018 1701   KETONESUR NEGATIVE 11/04/2018 1701   PROTEINUR NEGATIVE 11/04/2018 1701   NITRITE NEGATIVE 11/04/2018 1701   LEUKOCYTESUR NEGATIVE 11/04/2018 1701   Sepsis Labs Invalid input(s): PROCALCITONIN,  WBC,  LACTICIDVEN Microbiology Recent Results (from the past 240 hour(s))  Culture, blood (routine x 2) Call MD if unable to obtain prior to antibiotics being given     Status: None (Preliminary result)   Collection Time: 11/15/18  4:33 PM  Result Value Ref Range Status   Specimen Description BLOOD RIGHT HAND  Final   Special Requests   Final    BOTTLES DRAWN AEROBIC ONLY Blood Culture results may not be optimal due to an inadequate volume of blood received in culture bottles   Culture   Final    NO GROWTH 4 DAYS Performed at [facility], [address], [place], [place] [address]    Report Status PENDING  Incomplete  Culture, blood (routine x 2) Call MD if unable to obtain prior to antibiotics being given     Status: None (Preliminary result)   Collection Time: 11/15/18  4:40 PM  Result Value Ref Range Status   Specimen Description BLOOD LEFT ARM  Final   Special Requests   Final    BOTTLES  DRAWN AEROBIC ONLY Blood Culture results may not be optimal due to an inadequate volume of blood received in culture bottles   Culture   Final    NO GROWTH 4 DAYS Performed at [facility], [address], [place], [place] [address]    Report Status PENDING  Incomplete  MRSA PCR Screening     Status: None   Collection Time: 11/15/18  5:23 PM  Result Value Ref Range Status   MRSA by PCR NEGATIVE NEGATIVE Final    Comment:        The GeneXpert MRSA Assay (FDA approved for NASAL specimens only), is one component of a comprehensive MRSA colonization surveillance program. It is not intended to diagnose MRSA infection nor to guide or monitor treatment for MRSA infections. Performed at [facility], [address] [place], [place] [address]     Please note: You were cared for by a hospitalist during your hospital stay. Once you are discharged, your primary care physician will handle any further medical issues. Please note that NO REFILLS for any discharge medications will be authorized once you are discharged, as it is imperative that you return to your primary care physician (or establish a relationship with a primary care physician if you do not have one) for your post hospital discharge needs so that they can reassess your need for medications and monitor your lab values.    Time coordinating discharge: 40 minutes  SIGNED:   Burnadette Pop, MD  [facility] 11/19/2018, 2:45  PM Pager [phone]  If 7PM-7AM, please contact night-coverage [email or web address] Password [email or web address]

## 2018-11-19 NOTE — Progress Notes (Signed)
Patient will DC to: [facility]  DC Date: 11/19/2018 Family Notified: [name], sister Transport [id number]  RN, patient, and facility notified of DC. Discharge Summary sent to facility. RN given number for [phone],Room 1203-P.  Ambulance transport requested for patient.   Clinical Social Worker signing off. Antony Blackbird, [facility] Clinical Social Worker [phone]

## 2018-11-20 LAB — CULTURE, BLOOD (ROUTINE X 2)
Culture: NO GROWTH
Culture: NO GROWTH

## 2018-12-03 ENCOUNTER — Encounter (HOSPITAL_COMMUNITY): Payer: Self-pay | Admitting: Emergency Medicine

## 2018-12-03 ENCOUNTER — Emergency Department (HOSPITAL_COMMUNITY): Payer: Medicare HMO

## 2018-12-03 ENCOUNTER — Inpatient Hospital Stay (HOSPITAL_COMMUNITY)
Admission: EM | Admit: 2018-12-03 | Discharge: 2018-12-25 | DRG: 064 | Disposition: A | Discharge status: Skilled Nursing Facility | Payer: Medicare HMO | Attending: Internal Medicine | Admitting: Internal Medicine

## 2018-12-03 ENCOUNTER — Other Ambulatory Visit: Payer: Self-pay

## 2018-12-03 DIAGNOSIS — H518 Other specified disorders of binocular movement: Secondary | ICD-10-CM | POA: Diagnosis present

## 2018-12-03 DIAGNOSIS — E785 Hyperlipidemia, unspecified: Secondary | ICD-10-CM | POA: Diagnosis present

## 2018-12-03 DIAGNOSIS — R29714 NIHSS score 14: Secondary | ICD-10-CM | POA: Diagnosis not present

## 2018-12-03 DIAGNOSIS — R1312 Dysphagia, oropharyngeal phase: Secondary | ICD-10-CM

## 2018-12-03 DIAGNOSIS — Z9884 Bariatric surgery status: Secondary | ICD-10-CM

## 2018-12-03 DIAGNOSIS — G8194 Hemiplegia, unspecified affecting left nondominant side: Secondary | ICD-10-CM | POA: Diagnosis not present

## 2018-12-03 DIAGNOSIS — Z885 Allergy status to narcotic agent status: Secondary | ICD-10-CM

## 2018-12-03 DIAGNOSIS — R52 Pain, unspecified: Secondary | ICD-10-CM

## 2018-12-03 DIAGNOSIS — F329 Major depressive disorder, single episode, unspecified: Secondary | ICD-10-CM | POA: Diagnosis present

## 2018-12-03 DIAGNOSIS — M4856XA Collapsed vertebra, not elsewhere classified, lumbar region, initial encounter for fracture: Secondary | ICD-10-CM | POA: Diagnosis present

## 2018-12-03 DIAGNOSIS — M549 Dorsalgia, unspecified: Secondary | ICD-10-CM

## 2018-12-03 DIAGNOSIS — I639 Cerebral infarction, unspecified: Secondary | ICD-10-CM | POA: Diagnosis present

## 2018-12-03 DIAGNOSIS — R471 Dysarthria and anarthria: Secondary | ICD-10-CM | POA: Diagnosis present

## 2018-12-03 DIAGNOSIS — I4821 Permanent atrial fibrillation: Secondary | ICD-10-CM | POA: Diagnosis present

## 2018-12-03 DIAGNOSIS — Z882 Allergy status to sulfonamides status: Secondary | ICD-10-CM

## 2018-12-03 DIAGNOSIS — R29700 NIHSS score 0: Secondary | ICD-10-CM | POA: Diagnosis present

## 2018-12-03 DIAGNOSIS — Z6841 Body Mass Index (BMI) 40.0 and over, adult: Secondary | ICD-10-CM

## 2018-12-03 DIAGNOSIS — I63411 Cerebral infarction due to embolism of right middle cerebral artery: Principal | ICD-10-CM | POA: Diagnosis present

## 2018-12-03 DIAGNOSIS — D649 Anemia, unspecified: Secondary | ICD-10-CM | POA: Diagnosis present

## 2018-12-03 DIAGNOSIS — R1310 Dysphagia, unspecified: Secondary | ICD-10-CM

## 2018-12-03 DIAGNOSIS — Z87891 Personal history of nicotine dependence: Secondary | ICD-10-CM

## 2018-12-03 DIAGNOSIS — Z8673 Personal history of transient ischemic attack (TIA), and cerebral infarction without residual deficits: Secondary | ICD-10-CM

## 2018-12-03 DIAGNOSIS — R414 Neurologic neglect syndrome: Secondary | ICD-10-CM | POA: Diagnosis present

## 2018-12-03 DIAGNOSIS — Z8719 Personal history of other diseases of the digestive system: Secondary | ICD-10-CM

## 2018-12-03 DIAGNOSIS — Z90710 Acquired absence of both cervix and uterus: Secondary | ICD-10-CM

## 2018-12-03 DIAGNOSIS — Z515 Encounter for palliative care: Secondary | ICD-10-CM

## 2018-12-03 DIAGNOSIS — Z880 Allergy status to penicillin: Secondary | ICD-10-CM

## 2018-12-03 DIAGNOSIS — R4182 Altered mental status, unspecified: Secondary | ICD-10-CM

## 2018-12-03 DIAGNOSIS — I10 Essential (primary) hypertension: Secondary | ICD-10-CM | POA: Diagnosis present

## 2018-12-03 DIAGNOSIS — W19XXXA Unspecified fall, initial encounter: Secondary | ICD-10-CM | POA: Diagnosis present

## 2018-12-03 DIAGNOSIS — F32A Depression, unspecified: Secondary | ICD-10-CM | POA: Diagnosis present

## 2018-12-03 DIAGNOSIS — R0602 Shortness of breath: Secondary | ICD-10-CM

## 2018-12-03 DIAGNOSIS — R4701 Aphasia: Secondary | ICD-10-CM | POA: Diagnosis present

## 2018-12-03 DIAGNOSIS — Y92129 Unspecified place in nursing home as the place of occurrence of the external cause: Secondary | ICD-10-CM

## 2018-12-03 DIAGNOSIS — J9601 Acute respiratory failure with hypoxia: Secondary | ICD-10-CM | POA: Diagnosis present

## 2018-12-03 DIAGNOSIS — Z66 Do not resuscitate: Secondary | ICD-10-CM | POA: Diagnosis present

## 2018-12-03 DIAGNOSIS — R29810 Facial weakness: Secondary | ICD-10-CM | POA: Diagnosis present

## 2018-12-03 DIAGNOSIS — I493 Ventricular premature depolarization: Secondary | ICD-10-CM | POA: Diagnosis present

## 2018-12-03 DIAGNOSIS — I4891 Unspecified atrial fibrillation: Secondary | ICD-10-CM | POA: Diagnosis present

## 2018-12-03 DIAGNOSIS — Z0189 Encounter for other specified special examinations: Secondary | ICD-10-CM

## 2018-12-03 DIAGNOSIS — R29716 NIHSS score 16: Secondary | ICD-10-CM | POA: Diagnosis not present

## 2018-12-03 DIAGNOSIS — Z7189 Other specified counseling: Secondary | ICD-10-CM

## 2018-12-03 DIAGNOSIS — Z79899 Other long term (current) drug therapy: Secondary | ICD-10-CM

## 2018-12-03 DIAGNOSIS — Z8679 Personal history of other diseases of the circulatory system: Secondary | ICD-10-CM

## 2018-12-03 DIAGNOSIS — F419 Anxiety disorder, unspecified: Secondary | ICD-10-CM | POA: Diagnosis present

## 2018-12-03 DIAGNOSIS — Z888 Allergy status to other drugs, medicaments and biological substances status: Secondary | ICD-10-CM

## 2018-12-03 DIAGNOSIS — I700 Atherosclerosis of aorta: Secondary | ICD-10-CM | POA: Diagnosis present

## 2018-12-03 LAB — COMPREHENSIVE METABOLIC PANEL
ALK PHOS: 40 U/L (ref 38–126)
ALT: 14 U/L (ref 0–44)
AST: 58 U/L — AB (ref 15–41)
Albumin: 3.3 g/dL — ABNORMAL LOW (ref 3.5–5.0)
Anion gap: 8 (ref 5–15)
BUN: 10 mg/dL (ref 8–23)
CO2: 24 mmol/L (ref 22–32)
Calcium: 9.1 mg/dL (ref 8.9–10.3)
Chloride: 104 mmol/L (ref 98–111)
Creatinine, Ser: 0.94 mg/dL (ref 0.44–1.00)
GFR calc Af Amer: >60 mL/min (ref >60)
GFR calc non Af Amer: 60 mL/min — ABNORMAL LOW (ref >60)
Glucose, Bld: 94 mg/dL (ref 70–99)
Potassium: 5.3 mmol/L — ABNORMAL HIGH (ref 3.5–5.1)
Sodium: 136 mmol/L (ref 135–145)
Total Bilirubin: 1.3 mg/dL — ABNORMAL HIGH (ref 0.3–1.2)
Total Protein: 6.2 g/dL — ABNORMAL LOW (ref 6.5–8.1)

## 2018-12-03 LAB — CBC WITH DIFFERENTIAL/PLATELET
Abs Immature Granulocytes: 0.02 10*3/uL (ref 0.00–0.07)
Basophils Absolute: 0 10*3/uL (ref 0.0–0.1)
Basophils Relative: 0 %
Eosinophils Absolute: 0.2 10*3/uL (ref 0.0–0.5)
Eosinophils Relative: 2 %
HCT: 32.7 % — ABNORMAL LOW (ref 36.0–46.0)
Hemoglobin: 10.2 g/dL — ABNORMAL LOW (ref 12.0–15.0)
IMMATURE GRANULOCYTES: 0 %
LYMPHS PCT: 13 %
Lymphs Abs: 0.9 10*3/uL (ref 0.7–4.0)
MCH: 29.7 pg (ref 26.0–34.0)
MCHC: 31.2 g/dL (ref 30.0–36.0)
MCV: 95.1 fL (ref 80.0–100.0)
Monocytes Absolute: 0.7 10*3/uL (ref 0.1–1.0)
Monocytes Relative: 10 %
Neutro Abs: 5.6 10*3/uL (ref 1.7–7.7)
Neutrophils Relative %: 75 %
Platelets: 319 10*3/uL (ref 150–400)
RBC: 3.44 MIL/uL — ABNORMAL LOW (ref 3.87–5.11)
RDW: 15 % (ref 11.5–15.5)
WBC: 7.5 10*3/uL (ref 4.0–10.5)
nRBC: 0 % (ref 0.0–0.2)

## 2018-12-03 LAB — TROPONIN I: Troponin I: <0.03 ng/mL (ref <0.03)

## 2018-12-03 MED ORDER — QUETIAPINE FUMARATE 50 MG PO TABS: 50.0000 mg | ORAL_TABLET | Freq: Every day | ORAL | Status: DC

## 2018-12-03 MED ORDER — ENOXAPARIN SODIUM 40 MG/0.4ML ~~LOC~~ SOLN
40.0000 mg | SUBCUTANEOUS | Status: DC
  Administered 2018-12-04 – 2018-12-25 (×22): 40 mg via SUBCUTANEOUS
  Filled 2018-12-03 (×22): qty 0.4

## 2018-12-03 MED ORDER — SODIUM CHLORIDE 0.9 % IV SOLN
INTRAVENOUS | Status: DC
  Administered 2018-12-03: 1000 mL via INTRAVENOUS

## 2018-12-03 MED ORDER — SENNOSIDES-DOCUSATE SODIUM 8.6-50 MG PO TABS: 1.0000 | ORAL_TABLET | Freq: Every evening | ORAL | Status: DC | PRN

## 2018-12-03 MED ORDER — LIDOCAINE 5 % EX PTCH: 1.0000 | MEDICATED_PATCH | CUTANEOUS | 0 refills | Status: DC

## 2018-12-03 MED ORDER — ESCITALOPRAM OXALATE 10 MG PO TABS: 10.0000 mg | ORAL_TABLET | Freq: Every day | ORAL | Status: DC

## 2018-12-03 MED ORDER — ASPIRIN 300 MG RE SUPP
300.0000 mg | Freq: Every day | RECTAL | Status: DC
  Administered 2018-12-04: 300 mg via RECTAL
  Filled 2018-12-03: qty 1

## 2018-12-03 MED ORDER — ACETAMINOPHEN 650 MG RE SUPP: 650.0000 mg | RECTAL | Status: DC | PRN

## 2018-12-03 MED ORDER — VENLAFAXINE HCL 50 MG PO TABS
50.0000 mg | ORAL_TABLET | Freq: Two times a day (BID) | ORAL | Status: DC
  Administered 2018-12-06 – 2018-12-08 (×4): 50 mg via ORAL
  Filled 2018-12-03 (×10): qty 1

## 2018-12-03 MED ORDER — ACETAMINOPHEN 325 MG PO TABS
650.0000 mg | ORAL_TABLET | ORAL | Status: DC | PRN
  Administered 2018-12-07: 650 mg via ORAL
  Filled 2018-12-03: qty 2

## 2018-12-03 MED ORDER — GUAIFENESIN ER 600 MG PO TB12: 600.0000 mg | ORAL_TABLET | Freq: Two times a day (BID) | ORAL | Status: DC | PRN

## 2018-12-03 MED ORDER — SODIUM CHLORIDE 0.9 % IV BOLUS
500.0000 mL | Freq: Once | INTRAVENOUS | Status: AC
  Administered 2018-12-03: 500 mL via INTRAVENOUS

## 2018-12-03 MED ORDER — PANTOPRAZOLE SODIUM 40 MG PO TBEC: 40.0000 mg | DELAYED_RELEASE_TABLET | Freq: Two times a day (BID) | ORAL | Status: DC

## 2018-12-03 MED ORDER — LORAZEPAM 0.5 MG PO TABS: 0.5000 mg | ORAL_TABLET | Freq: Every evening | ORAL | Status: DC | PRN

## 2018-12-03 MED ORDER — ASPIRIN 325 MG PO TABS: 325.0000 mg | ORAL_TABLET | Freq: Every day | ORAL | Status: DC

## 2018-12-03 MED ORDER — MIRTAZAPINE 15 MG PO TABS: 45.0000 mg | ORAL_TABLET | Freq: Every day | ORAL | Status: DC

## 2018-12-03 MED ORDER — STROKE: EARLY STAGES OF RECOVERY BOOK
Freq: Once | Status: AC
  Administered 2018-12-03

## 2018-12-03 MED ORDER — ACETAMINOPHEN 160 MG/5ML PO SOLN: 650.0000 mg | ORAL | Status: DC | PRN

## 2018-12-03 NOTE — H&P (Signed)
History and Physical    Adrienne Stone RUE:[id number] DOB: 1946/03/21 DOA: 12/03/2018  PCP: [name], PA-C   Patient coming from: Nursing facility   Chief Complaint: Left-sided weakness, slurred speech, fall   HPI: Adrienne Stone is a 73 y.o. female with medical history significant for depression with anxiety, hypertension, atrial fibrillation not anticoagulated due to recurrent GI bleeding, now presenting to the emergency department from her nursing facility for evaluation of left-sided weakness, dysarthria, and fall.  Patient is accompanied by her sister who assists with the history.  Nursing facility personnel reported that the patient fell at approximately 1:30 PM, but had told the patient's sister that she fell at approximately 5 PM.  Around the same time, she was noted to have a new speech difficulty and was not moving her left side very well.  EMS was called and she was brought into the ED for evaluation of this.  Patient's ability to provide a history is limited by her severe dysarthria.  Her sister was with her yesterday, she seemed to be in her usual state at that time, and was not complaining of anything.  ED Course: Upon arrival to the ED, patient is found to be afebrile and mildly hypertensive.  EKG features atrial fibrillation with PVC.  Chest x-ray is notable for cardiomegaly, but no acute finding.  Noncontrast head CT reveals hypoattenuation within the right basal ganglia concerning for acute/early subacute CVA, as well as a dense right M1 segment concerning for thrombus.  Chemistry panel features a potassium of 5.3 and bilirubin 1.3.  CBC is notable for a chronic normocytic anemia.  Troponin is undetectable.  Patient was given 500 cc normal saline.  Neurology was consulted by the ED physician and recommended medical admission for further evaluation and management.  Review of Systems:  All other systems reviewed and apart from HPI, are negative.  Past Medical History:    Diagnosis Date  . Depression   . Hypercholesteremia   . Hypertension     Past Surgical History:  Procedure Laterality Date  . ABDOMINAL HYSTERECTOMY    . BIOPSY  11/17/2018   Procedure: BIOPSY;  Surgeon: Benancio Deeds, MD;  Location: [facility] ENDOSCOPY;  Service: Gastroenterology;;  . CHOLECYSTECTOMY    . ESOPHAGOGASTRODUODENOSCOPY (EGD) WITH PROPOFOL N/A 11/17/2018   Procedure: ESOPHAGOGASTRODUODENOSCOPY (EGD) WITH PROPOFOL;  Surgeon: Benancio Deeds, MD;  Location: [facility] ENDOSCOPY;  Service: Gastroenterology;  Laterality: N/A;  . GASTRIC BYPASS    . thumb surgery       reports that she has quit smoking. She has never used smokeless tobacco. She reports current alcohol use. She reports that she does not use drugs.  Allergies  Allergen Reactions  . Demerol [Meperidine] Rash  . Penicillins Rash    Did it involve swelling of the face/tongue/throat, SOB, or low BP? Unknown Did it involve sudden or severe rash/hives, skin peeling, or any reaction on the inside of your mouth or nose? Unknown Did you need to seek medical attention at a hospital or doctor's office? Unknown When did it last happen?15 years ago If all above answers are "NO", may proceed with cephalosporin use.   . Prozac [Fluoxetine Hcl] Palpitations  . Serzone [Nefazodone] Rash  . Sulfa Antibiotics Rash    History reviewed. No pertinent family history.   Prior to Admission medications   Medication Sig Start Date End Date Taking? Authorizing Provider  acetaminophen (TYLENOL) 650 MG CR tablet Take 1,300 mg by mouth every 8 (eight) hours as needed  for pain.   Yes [provider]  Calcium Carbonate-Vit D-Min (CALCIUM 1200 PO) Take 1,200 mg by mouth daily.    Yes [provider]  cloNIDine (CATAPRES) 0.1 MG tablet Take 0.1 mg by mouth 2 (two) times daily. 01/27/18  Yes [provider]  diltiazem (CARDIZEM CD) 360 MG 24 hr capsule Take 1 capsule (360 mg total) by mouth daily. 11/20/18  Yes  Burnadette PopAdhikari, Amrit, MD  escitalopram (LEXAPRO) 10 MG tablet Take 10 mg by mouth daily.   Yes [provider]  fenofibrate 160 MG tablet Take 160 mg by mouth daily. 07/02/16  Yes [provider]  ferrous sulfate 325 (65 FE) MG tablet Take 325 mg by mouth daily with breakfast.   Yes [provider]  guaiFENesin (MUCINEX) 600 MG 12 hr tablet Take 600 mg by mouth 2 (two) times daily.   Yes [provider]  lisinopril (PRINIVIL,ZESTRIL) 5 MG tablet Take 1 tablet (5 mg total) by mouth daily. 11/20/18  Yes Burnadette PopAdhikari, Amrit, MD  loratadine (CLARITIN) 10 MG tablet Take 10 mg by mouth daily.   Yes [provider]  LORazepam (ATIVAN) 0.5 MG tablet Take 0.5 mg by mouth at bedtime as needed for anxiety. 11/25/18 12/07/18 Yes [provider]  Magnesium 250 MG TABS Take 250 mg by mouth daily.    Yes [provider]  metoprolol tartrate (LOPRESSOR) 25 MG tablet Take 75 mg by mouth 2 (two) times daily.   Yes [provider]  mirtazapine (REMERON) 45 MG tablet Take 45 mg by mouth at bedtime.   Yes [provider]  Multiple Vitamin (MULTIVITAMIN) capsule Take 1 capsule by mouth daily.   Yes [provider]  omeprazole (PRILOSEC) 40 MG capsule crack open and ingest contents to maximize absorption of PPI given your gastric bypass state Patient taking differently: Take 40 mg by mouth See admin instructions. crack open and ingest contents to maximize absorption of PPI given your gastric bypass state 11/19/18  Yes Adhikari, Willia CrazeAmrit, MD  polyethylene glycol powder (GLYCOLAX/MIRALAX) powder Take 17 g by mouth daily as needed for mild constipation.   Yes [provider]  QUEtiapine (SEROQUEL) 50 MG tablet Take 50 mg by mouth at bedtime.  11/02/18  Yes [provider]  venlafaxine (EFFEXOR) 50 MG tablet Take 50 mg by mouth 2 (two) times daily.   Yes [provider]  vitamin C (ASCORBIC ACID) 500 MG tablet Take 1,000 mg by  mouth daily.    Yes [provider]  lidocaine (LIDODERM) 5 % Place 1 patch onto the skin daily. Remove & Discard patch within 12 hours or as directed by MD 12/03/18   Donnetta Hutchingook, Brian, MD    Physical Exam: Vitals:   12/03/18 2100 12/03/18 2130 12/03/18 2200 12/03/18 2300  BP: (!) 126/99 (!) 163/91 (!) 160/85 (!) 160/111  Pulse: 82 91 88 (!) 109  Resp:   16 17  Temp:    98.5 F (36.9 C)  TempSrc:    Oral  SpO2: 100% 100% 100% 100%  Weight:      Height:        Constitutional: NAD, calm  Eyes: PERTLA, lids and conjunctivae normal ENMT: Mucous membranes are moist. Posterior pharynx clear of any exudate or lesions.   Neck: normal, supple, no masses, no thyromegaly Respiratory: clear to auscultation bilaterally, no wheezing, no crackles. Normal respiratory effort.    Cardiovascular: Rate ~80 and irregualr. No significant JVD. Abdomen: No distension, no tenderness, soft. Bowel sounds normal.  Musculoskeletal: no clubbing / cyanosis. No joint deformity upper and lower extremities.   Skin: no significant rashes, lesions, ulcers. Warm, dry, well-perfused. Neurologic: Left facial weakness. Dysarthria. Strength 5/5 on right, 3/5 involving LUE and LLE.   Psychiatric: Alert. Oriented to person, place, and situation.     Labs on Admission: I have personally reviewed following labs and imaging studies  CBC: Recent Labs  Lab 12/03/18 1956  WBC 7.5  NEUTROABS 5.6  HGB 10.2*  HCT 32.7*  MCV 95.1  PLT 319   Basic Metabolic Panel: Recent Labs  Lab 12/03/18 1956  NA 136  K 5.3*  CL 104  CO2 24  GLUCOSE 94  BUN 10  CREATININE 0.94  CALCIUM 9.1   GFR: Estimated Creatinine Clearance: 61.7 mL/min (by C-G formula based on SCr of 0.94 mg/dL). Liver Function Tests: Recent Labs  Lab 12/03/18 1956  AST 58*  ALT 14  ALKPHOS 40  BILITOT 1.3*  PROT 6.2*  ALBUMIN 3.3*   No results for input(s): LIPASE, AMYLASE in the last 168 hours. No results for input(s): AMMONIA in the last  168 hours. Coagulation Profile: No results for input(s): INR, PROTIME in the last 168 hours. Cardiac Enzymes: Recent Labs  Lab 12/03/18 1956  TROPONINI <0.03   BNP (last 3 results) No results for input(s): PROBNP in the last 8760 hours. HbA1C: No results for input(s): HGBA1C in the last 72 hours. CBG: No results for input(s): GLUCAP in the last 168 hours. Lipid Profile: No results for input(s): CHOL, HDL, LDLCALC, TRIG, CHOLHDL, LDLDIRECT in the last 72 hours. Thyroid Function Tests: No results for input(s): TSH, T4TOTAL, FREET4, T3FREE, THYROIDAB in the last 72 hours. Anemia Panel: No results for input(s): VITAMINB12, FOLATE, FERRITIN, TIBC, IRON, RETICCTPCT in the last 72 hours. Urine analysis:    Component Value Date/Time   COLORURINE YELLOW 11/04/2018 1701   APPEARANCEUR CLEAR 11/04/2018 1701   LABSPEC 1.015 11/04/2018 1701   PHURINE 5.0 11/04/2018 1701   GLUCOSEU NEGATIVE 11/04/2018 1701   HGBUR NEGATIVE 11/04/2018 1701   BILIRUBINUR NEGATIVE 11/04/2018 1701   KETONESUR NEGATIVE 11/04/2018 1701   PROTEINUR NEGATIVE 11/04/2018 1701   NITRITE NEGATIVE 11/04/2018 1701   LEUKOCYTESUR NEGATIVE 11/04/2018 1701   Sepsis Labs: @LABRCNTIP (procalcitonin:4,lacticidven:4) )No results found for this or any previous visit (from the past 240 hour(s)).   Radiological Exams on Admission: Ct Head Wo Contrast  Result Date: 12/03/2018 CLINICAL DATA:  73 y/o F; unwitnessed fall, altered level of consciousness. EXAM: CT HEAD WITHOUT CONTRAST TECHNIQUE: Contiguous axial images were obtained from the base of the skull through the vertex without intravenous contrast. COMPARISON:  08/19/2018 CT head. 09/24/2018 MRI head. FINDINGS: Brain: Hypoattenuation within the right basal ganglia involving the caudate nucleus, lentiform nucleus, and posterior limb of internal capsule compatible with acute/early subacute infarction. ASPECTS is 7. no additional findings of stroke, hemorrhage, extra-axial  collection, hydrocephalus, or herniation. Vascular: Dense right M1, suspected thrombus (series 3, image 17). Skull: Normal. Negative for fracture or focal lesion. Sinuses/Orbits: No acute finding. Other: None. IMPRESSION: 1. Hypoattenuation within right basal ganglia involving caudate nucleus, lentiform nucleus, and posterior limb of internal capsule compatible with acute/early subacute infarction. ASPECTS is 7. No hemorrhage. 2. Dense right M1, suspected thrombus. These results were called by telephone at the time of interpretation on 12/03/2018 at 8:32 pm to Dr. Donnetta Hutching , who verbally acknowledged these results. Electronically Signed   By: Mitzi Hansen M.D.   On: 12/03/2018 20:34   [facility] [address]  Result Date: 12/03/2018 CLINICAL DATA:  Cough EXAM: PORTABLE CHEST 1 VIEW COMPARISON:  11/14/2018 FINDINGS: Mild cardiomegaly. No confluent opacity, effusion or edema. No acute bony abnormality. IMPRESSION: Cardiomegaly.  No acute findings. Electronically Signed   By: Charlett Nose M.D.   On: 12/03/2018 19:21    EKG: Independently reviewed. Atrial fibrillation, PVC.   Assessment/Plan   1. Acute ischemic CVA  - Presents with acute left-sided weakness and dysarthria  - Conflicting reports of time of onset from nursing staff at her facility, possibly 13:30 or 17:00  - CT head concerning for acute infarction involving right basal ganglia  - Neurology is consulting and much appreciated  - Check MRI brain, MRA head, carotid US, echocariogram, fasting lipids, and A1c  - Continue cardiac monitoring with frequent neuro checks, PT/OT/SLP evals; start ASA    2. 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 Anemia; history of GIB  - Hgb is 10.2, up from recent admission when she was having UGIB  - Continue PPI   4. Depression with anxiety  - Continue Lexapro, Remeron, Seroquel, Ativan, and Effexor as tolerated   5. Hypertension  - BP elevated in ED  - Permit HTN in acute-phase of ischemic CVA    DVT prophylaxis: Lovenox  Code Status: DNR  Family Communication: Sister updated at bedside Consults called: Neurology  Admission status: Observation     Briscoe Deutscher, MD [facility] Pager [phone]  If 7PM-7AM, please contact night-coverage [email or web address] Password [facility]  12/03/2018, 11:38 PM

## 2018-12-03 NOTE — Consult Note (Addendum)
Referring Physician: Dr. Antionette Char    Chief Complaint: Left sided weakness  HPI: Adrienne Stone is an 73 y.o. female who presented to the ED from [facility] on Thursday night with an unwitnessed fall.  PO2 was low per staff there, in the 80s, so she was placed on 2L. She was altered per EMS. LKN was 1330. EDP noted that she seemed slightly confused. Exam revealed left sided weakness. A CT was obtained, revealing a right basal ganglia stroke.   She has a diagnosis of atrial fibrillation. She has not been on an antiplatelet medication or anticoagulation due to history of recurrent GI bleeding with significant blood loss.    LSN: 1330 on Thursday tPA Given: No: Out of time window.   Past Medical History:  Diagnosis Date  . Depression   . Hypercholesteremia   . Hypertension     Past Surgical History:  Procedure Laterality Date  . ABDOMINAL HYSTERECTOMY    . BIOPSY  11/17/2018   Procedure: BIOPSY;  Surgeon: Benancio Deeds, MD;  Location: [facility] ENDOSCOPY;  Service: Gastroenterology;;  . CHOLECYSTECTOMY    . ESOPHAGOGASTRODUODENOSCOPY (EGD) WITH PROPOFOL N/A 11/17/2018   Procedure: ESOPHAGOGASTRODUODENOSCOPY (EGD) WITH PROPOFOL;  Surgeon: Benancio Deeds, MD;  Location[facility] ENDOSCOPY;  Service: Gastroenterology;  Laterality: N/A;  . GASTRIC BYPASS    . thumb surgery      History reviewed. No pertinent family history. Social History:  reports that she has quit smoking. She has never used smokeless tobacco. She reports current alcohol use. She reports that she does not use drugs.  Allergies:  Allergies  Allergen Reactions  . Demerol [Meperidine] Rash  . Penicillins Rash  . Prozac [Fluoxetine Hcl] Palpitations  . Serzone [Nefazodone] Rash  . Sulfa Antibiotics Rash    Medications:  Prior to Admission:  Medications Prior to Admission  Medication Sig Dispense Refill Last Dose  . acetaminophen (TYLENOL) 650 MG CR tablet Take 1,300 mg by mouth every 8 (eight) hours as needed for  pain.   12/02/2018 at Unknown time  . Calcium Carbonate-Vit D-Min (CALCIUM 1200 PO) Take 1,200 mg by mouth daily.    12/03/2018 at Unknown time  . cloNIDine (CATAPRES) 0.1 MG tablet Take 0.1 mg by mouth 2 (two) times daily.   12/03/2018 at Unknown time  . diltiazem (CARDIZEM CD) 360 MG 24 hr capsule Take 1 capsule (360 mg total) by mouth daily.   12/03/2018 at Unknown time  . escitalopram (LEXAPRO) 10 MG tablet Take 10 mg by mouth daily.   12/03/2018 at Unknown time  . fenofibrate 160 MG tablet Take 160 mg by mouth daily.   12/02/2018 at Unknown time  . ferrous sulfate 325 (65 FE) MG tablet Take 325 mg by mouth daily with breakfast.   12/02/2018 at Unknown time  . guaiFENesin (MUCINEX) 600 MG 12 hr tablet Take 600 mg by mouth 2 (two) times daily.   12/03/2018 at Unknown time  . lisinopril (PRINIVIL,ZESTRIL) 5 MG tablet Take 1 tablet (5 mg total) by mouth daily.   12/03/2018 at Unknown time  . loratadine (CLARITIN) 10 MG tablet Take 10 mg by mouth daily.   12/03/2018 at Unknown time  . LORazepam (ATIVAN) 0.5 MG tablet Take 0.5 mg by mouth at bedtime as needed for anxiety.   12/02/2018 at Unknown time  . Magnesium 250 MG TABS Take 250 mg by mouth daily.    Past Week at Unknown time  . metoprolol tartrate (LOPRESSOR) 25 MG tablet Take 75 mg by mouth  2 (two) times daily.   12/03/2018 at 0858  . mirtazapine (REMERON) 45 MG tablet Take 45 mg by mouth at bedtime.   12/02/2018 at Unknown time  . Multiple Vitamin (MULTIVITAMIN) capsule Take 1 capsule by mouth daily.   12/03/2018 at Unknown time  . omeprazole (PRILOSEC) 40 MG capsule crack open and ingest contents to maximize absorption of PPI given your gastric bypass state (Patient taking differently: Take 40 mg by mouth See admin instructions. crack open and ingest contents to maximize absorption of PPI given your gastric bypass state) 60 capsule 0 12/03/2018 at Unknown time  . polyethylene glycol powder (GLYCOLAX/MIRALAX) powder Take 17 g by mouth daily as needed for  mild constipation.   12/02/2018 at Unknown time  . QUEtiapine (SEROQUEL) 50 MG tablet Take 50 mg by mouth at bedtime.    12/02/2018 at Unknown time  . venlafaxine (EFFEXOR) 50 MG tablet Take 50 mg by mouth 2 (two) times daily.   12/03/2018 at Unknown time  . vitamin C (ASCORBIC ACID) 500 MG tablet Take 1,000 mg by mouth daily.    12/02/2018 at Unknown time   Scheduled: . aspirin  300 mg Rectal Daily   Or  . aspirin  325 mg Oral Daily  . enoxaparin (LOVENOX) injection  40 mg Subcutaneous Q24H  . escitalopram  10 mg Oral Daily  . mirtazapine  45 mg Oral QHS  . pantoprazole  40 mg Oral BID  . QUEtiapine  50 mg Oral QHS  . venlafaxine  50 mg Oral BID   Continuous: . sodium chloride 1,000 mL (12/03/18 2345)    ROS: No headache. Has 7/10 neck and back pain. No CP. Has a wet cough and congestion. No abdominal pain. No limb pain. Other ROS as per HPI.   Physical Examination: Blood pressure (!) 163/91, pulse 91, resp. rate 19, height  (1.6 m), weight 107.5 kg, SpO2 100 %.  HEENT: /AT Lungs: Rhonchi are grossly audible Ext: Warm and well perfused  Neurologic Examination: Mental Status: Drowsy. Oriented x 5. Able to follow all right sided commands. Pained affect. Speech dysarthric but fluent with intact comprehension. Had some difficulty with naming. Cranial Nerves: II:  Left visual field cut. PERRL.  III,IV, VI: Eyes conjugately deviated to the right. Able to gaze voluntarily to the left, reaching the midline, but unable to cross midline to the left volitionally. Can overcome with oculocephalic maneuver. No nystagmus.  V, VII: Left facial droop. Decreased reactivity to left sided facial stimuli.  VIII: hearing intact to voice IX,X: No hypophonia XI: Head preferentially rotated to right XII: Mild lingual dysarthria Motor: RUE 4+/5 RLE: 4/5 LUE: Flaccid with no volitional movement or withdrawal to noxious LLE: Flaccid tone with no volitional movement. Triple flexion response to  noxious Sensory:  Sensation intact to temp and FT, RUE and RLE.  Insensate to temp, FT and deep pressure, LUE and LLE. Deep Tendon Reflexes:  Right biceps and brachioradialis 4+ (spread) Left biceps and brachioradialis 2+ Right patella 0, left patella 2+ 0 achilles bilaterally Toes upgoing, left more briskly than right Cerebellar: No ataxia with FNF on the right. Unable to perform on the left.  Gait: Unable to assess.   Results for orders placed or performed during the hospital encounter of 12/03/18 (from the past 48 hour(s))  CBC with Differential     Status: Abnormal   Collection Time: 12/03/18  7:56 PM  Result Value Ref Range   WBC 7.5 4.0 - 10.5 K/uL   RBC  3.44 (L) 3.87 - 5.11 MIL/uL   Hemoglobin 10.2 (L) 12.0 - 15.0 g/dL   HCT 44.8 (L) 18.5 - 63.1 %   MCV 95.1 80.0 - 100.0 fL   MCH 29.7 26.0 - 34.0 pg   MCHC 31.2 30.0 - 36.0 g/dL   RDW 49.7 02.6 - 37.8 %   Platelets 319 150 - 400 K/uL   nRBC 0.0 0.0 - 0.2 %   Neutrophils Relative % 75 %   Neutro Abs 5.6 1.7 - 7.7 K/uL   Lymphocytes Relative 13 %   Lymphs Abs 0.9 0.7 - 4.0 K/uL   Monocytes Relative 10 %   Monocytes Absolute 0.7 0.1 - 1.0 K/uL   Eosinophils Relative 2 %   Eosinophils Absolute 0.2 0.0 - 0.5 K/uL   Basophils Relative 0 %   Basophils Absolute 0.0 0.0 - 0.1 K/uL   Immature Granulocytes 0 %   Abs Immature Granulocytes 0.02 0.00 - 0.07 K/uL    Comment: Performed at [facility], [address], [place], [place] [address]  Comprehensive metabolic panel     Status: Abnormal   Collection Time: 12/03/18  7:56 PM  Result Value Ref Range   Sodium 136 135 - 145 mmol/L   Potassium 5.3 (H) 3.5 - 5.1 mmol/L    Comment: SPECIMEN HEMOLYZED. HEMOLYSIS MAY AFFECT INTEGRITY OF RESULTS.   Chloride 104 98 - 111 mmol/L   CO2 24 22 - 32 mmol/L   Glucose, Bld 94 70 - 99 mg/dL   BUN 10 8 - 23 mg/dL   Creatinine, Ser 2.77 0.44 - 1.00 mg/dL   Calcium 9.1 8.9 - 41.2 mg/dL   Total Protein 6.2 (L) 6.5 - 8.1 g/dL    Albumin 3.3 (L) 3.5 - 5.0 g/dL   AST 58 (H) 15 - 41 U/L   ALT 14 0 - 44 U/L   Alkaline Phosphatase 40 38 - 126 U/L   Total Bilirubin 1.3 (H) 0.3 - 1.2 mg/dL   GFR calc non Af Amer 60 (L) >60 mL/min   GFR calc Af Amer >60 >60 mL/min   Anion gap 8 5 - 15    Comment: Performed at [facility], [address] [place], [place] [address]  Troponin I - Once     Status: None   Collection Time: 12/03/18  7:56 PM  Result Value Ref Range   Troponin I <0.03 <0.03 ng/mL    Comment: Performed at [facility], [address], [place], [place] [address]   Ct Head Wo Contrast  Result Date: 12/03/2018 CLINICAL DATA:  73 y/o F; unwitnessed fall, altered level of consciousness. EXAM: CT HEAD WITHOUT CONTRAST TECHNIQUE: Contiguous axial images were obtained from the base of the skull through the vertex without intravenous contrast. COMPARISON:  08/19/2018 CT head. 09/24/2018 MRI head. FINDINGS: Brain: Hypoattenuation within the right basal ganglia involving the caudate nucleus, lentiform nucleus, and posterior limb of internal capsule compatible with acute/early subacute infarction. ASPECTS is 7. no additional findings of stroke, hemorrhage, extra-axial collection, hydrocephalus, or herniation. Vascular: Dense right M1, suspected thrombus (series 3, image 17). Skull: Normal. Negative for fracture or focal lesion. Sinuses/Orbits: No acute finding. Other: None. IMPRESSION: 1. Hypoattenuation within right basal ganglia involving caudate nucleus, lentiform nucleus, and posterior limb of internal capsule compatible with acute/early subacute infarction. ASPECTS is 7. No hemorrhage. 2. Dense right M1, suspected thrombus. These results were called by telephone at the time of interpretation on 12/03/2018 at 8:32 pm to Dr.  BRIAN COOK , who verbally acknowledged these results. Electronically Signed   By: Mitzi Hansen M.D.   On: 12/03/2018 20:34   Dg Chest Port 1 View  Result Date: 12/03/2018 CLINICAL  DATA:  Cough EXAM: PORTABLE CHEST 1 VIEW COMPARISON:  11/14/2018 FINDINGS: Mild cardiomegaly. No confluent opacity, effusion or edema. No acute bony abnormality. IMPRESSION: Cardiomegaly.  No acute findings. Electronically Signed   By: Charlett Nose M.D.   On: 12/03/2018 19:21    Assessment: 73 y.o. female with new left sided weakness, unknown time of onset 1. Exam reveals left facial droop, left hemiplegia, rightward eye deviation, dysarthria and left hemineglect, consistent with a large right MCA infarction 2. CT head reveals hypoattenuation within the right basal ganglia involving the caudate nucleus, lentiform nucleus, and posterior limb of internal capsule, compatible with acute/early subacute infarction. ASPECTS is 7. No hemorrhage. Dense right M1, with suspected thrombus is noted. 3. Not a tPA candidate due to time criteria 4. Not an endovascular candidate due to early CT findings consistent with irreversible ischemia/infarction at significant risk for hemorrhagic conversion 5. Stroke Risk Factors - hypercholesterolemia, HTN and morbid obesity  Plan: 1. HgbA1c, fasting lipid panel 2. MRI, MRA of the brain without contrast 3. PT consult, OT consult, Speech consult 4. Echocardiogram 5. Carotid dopplers 6. Prophylactic therapy - Starting ASA. Use with caution given history of GI bleeds. Hemoccult all stools. Monitor H and H. If she has any GI bleeding, stop ASA. May benefit from a GI consult 7. Risk factor modification 8. Telemetry monitoring 9. Frequent neuro checks 10. Start atorvastatin 40 mg po qd when no longer NPO 11. Permissive HTN x 24 hours. 12. Due to increasing drowsiness, obtaining a follow up STAT CT    @Electronically  signed: Dr. Caryl Pina 12/03/2018, 9:45 PM

## 2018-12-03 NOTE — ED Notes (Signed)
PTAR called for pt 

## 2018-12-03 NOTE — ED Notes (Signed)
Attempted report x1. 

## 2018-12-03 NOTE — ED Provider Notes (Addendum)
Level 5 caveat for altered mental status.  History is sketchy [facility] EMERGENCY DEPARTMENT Provider Note   CSN: [id number] Arrival date & time: 12/03/18  1802    History   Chief Complaint Chief Complaint  Patient presents with  . Fall    HPI Adrienne Stone is a 73 y.o. female.     Level 5 caveat for altered mental status.  History is sketchy.  Patient resides at [facility].  She apparently fell at approximately 1330 today per nursing history.  Uncertain of patient's baseline behavior.  She is verbal, but her speech is slightly confused.  Past medical history well documented.  Review of systems positive for left-sided weakness.  2145: Updated history from sister.  She states symptoms started at 1700 today.     Past Medical History:  Diagnosis Date  . Depression   . Hypercholesteremia   . Hypertension     Patient Active Problem List   Diagnosis Date Noted  . Gastric ulcer with hemorrhage   . Melena   . Atrial fibrillation with RVR (HCC) 11/10/2018  . Influenza A 11/05/2018  . Acute respiratory distress 11/05/2018  . Hypertensive urgency 11/05/2018  . Anemia 11/05/2018  . Acute respiratory failure with hypoxia (HCC) 11/04/2018    Past Surgical History:  Procedure Laterality Date  . ABDOMINAL HYSTERECTOMY    . BIOPSY  11/17/2018   Procedure: BIOPSY;  Surgeon: Benancio Deeds, MD;  Location: [facility] ENDOSCOPY;  Service: Gastroenterology;;  . CHOLECYSTECTOMY    . ESOPHAGOGASTRODUODENOSCOPY (EGD) WITH PROPOFOL N/A 11/17/2018   Procedure: ESOPHAGOGASTRODUODENOSCOPY (EGD) WITH PROPOFOL;  Surgeon: Benancio Deeds, MD;  Location: [facility] ENDOSCOPY;  Service: Gastroenterology;  Laterality: N/A;  . GASTRIC BYPASS    . thumb surgery       OB History   No obstetric history on file.      Home Medications    Prior to Admission medications   Medication Sig Start Date End Date Taking? Authorizing Provider  acetaminophen (TYLENOL) 650 MG CR  tablet Take 1,300 mg by mouth every 8 (eight) hours as needed for pain.    [provider]  Ascorbic Acid (VITAMIN C) 1000 MG tablet Take 1,000 mg by mouth daily.    [provider]  Calcium Carbonate-Vit D-Min (CALCIUM 1200 PO) Take by mouth.    [provider]  cloNIDine (CATAPRES) 0.1 MG tablet Take 0.1 mg by mouth 2 (two) times daily. 01/27/18   [provider]  diltiazem (CARDIZEM CD) 360 MG 24 hr capsule Take 1 capsule (360 mg total) by mouth daily. 11/20/18   Burnadette Pop, MD  escitalopram (LEXAPRO) 10 MG tablet Take 10 mg by mouth daily.    [provider]  fenofibrate 160 MG tablet Take 160 mg by mouth daily. 07/02/16   [provider]  ferrous sulfate 325 (65 FE) MG tablet Take 325 mg by mouth daily with breakfast.    [provider]  lidocaine (LIDODERM) 5 % Place 1 patch onto the skin daily. Remove & Discard patch within 12 hours or as directed by MD 12/03/18   Donnetta Hutching, MD  lisinopril (PRINIVIL,ZESTRIL) 5 MG tablet Take 1 tablet (5 mg total) by mouth daily. 11/20/18   Burnadette Pop, MD  loratadine (CLARITIN) 10 MG tablet Take 10 mg by mouth daily.    [provider]  LORazepam (ATIVAN) 1 MG tablet Take 1 tablet (1 mg total) by mouth 2 (two) times daily as needed for anxiety. 11/19/18  Burnadette Pop, MD  Magnesium 250 MG TABS Take by mouth.    [provider]  Metoprolol Tartrate 75 MG TABS Take 75 mg by mouth 2 (two) times daily. 11/19/18   Burnadette Pop, MD  mirtazapine (REMERON) 45 MG tablet Take 45 mg by mouth at bedtime.    [provider]  Multiple Vitamin (MULTIVITAMIN) capsule Take 1 capsule by mouth daily.    [provider]  multivitamin-lutein (OCUVITE-LUTEIN) CAPS capsule Take 1 capsule by mouth daily.    [provider]  omeprazole (PRILOSEC) 40 MG capsule crack open and ingest contents to maximize absorption of PPI given your gastric bypass state 11/19/18    Burnadette Pop, MD  QUEtiapine (SEROQUEL) 25 MG tablet Take 50 mg by mouth at bedtime. Take 2 tablets ( ) at bedtime 11/02/18   [provider]  venlafaxine (EFFEXOR) 50 MG tablet Take 50 mg by mouth 2 (two) times daily.    [provider]    Family History History reviewed. No pertinent family history.  Social History Social History   Tobacco Use  . Smoking status: Former Games developer  . Smokeless tobacco: Never Used  Substance Use Topics  . Alcohol use: Yes    Comment: daily  . Drug use: No     Allergies   Demerol [meperidine]; Penicillins; Prozac [fluoxetine hcl]; Serzone [nefazodone]; and Sulfa antibiotics   Review of Systems Review of Systems  Unable to perform ROS: Mental status change     Physical Exam Updated Vital Signs BP (!) 131/114   Pulse 81   Resp 18   Ht  (1.6 m)   Wt 107.5 kg   SpO2 100%   BMI 41.98 kg/m   Physical Exam Vitals signs and nursing note reviewed.  Constitutional:      Appearance: She is well-developed.     Comments: Elevated BMI, slurred speech  HENT:     Head: Normocephalic and atraumatic.  Eyes:     Conjunctiva/sclera: Conjunctivae normal.  Neck:     Musculoskeletal: Neck supple.  Cardiovascular:     Rate and Rhythm: Normal rate and regular rhythm.  Pulmonary:     Effort: Pulmonary effort is normal.     Breath sounds: Normal breath sounds.  Abdominal:     General: Bowel sounds are normal.     Palpations: Abdomen is soft.  Musculoskeletal: Normal range of motion.  Skin:    General: Skin is warm and dry.  Neurological:     Comments: Able to move right arm and leg right leg to command; attempts to move left arm and leg, but unable   Psychiatric:        Behavior: Behavior normal.      ED Treatments / Results  Labs (all labs ordered are listed, but only abnormal results are displayed) Labs Reviewed  CBC WITH DIFFERENTIAL/PLATELET - Abnormal; Notable for the following components:      Result  Value   RBC 3.44 (*)    Hemoglobin 10.2 (*)    HCT 32.7 (*)    All other components within normal limits  COMPREHENSIVE METABOLIC PANEL  URINALYSIS, ROUTINE W REFLEX MICROSCOPIC  TROPONIN I    EKG EKG Interpretation  Date/Time:  Thursday December 03 2018 18:08:52 EST Ventricular Rate:  72 PR Interval:    QRS Duration: 97 QT Interval:  402 QTC Calculation: 440 R Axis:   122 Text Interpretation:  Atrial fibrillation Ventricular premature complex Lateral infarct, old Anteroseptal infarct, age indeterminate Confirmed by Donnetta Hutching ([id number])  on 12/03/2018 8:42:24 PM   Radiology Ct Head Wo Contrast  Result Date: 12/03/2018 CLINICAL DATA:  73 y/o F; unwitnessed fall, altered level of consciousness. EXAM: CT HEAD WITHOUT CONTRAST TECHNIQUE: Contiguous axial images were obtained from the base of the skull through the vertex without intravenous contrast. COMPARISON:  08/19/2018 CT head. 09/24/2018 MRI head. FINDINGS: Brain: Hypoattenuation within the right basal ganglia involving the caudate nucleus, lentiform nucleus, and posterior limb of internal capsule compatible with acute/early subacute infarction. ASPECTS is 7. no additional findings of stroke, hemorrhage, extra-axial collection, hydrocephalus, or herniation. Vascular: Dense right M1, suspected thrombus (series 3, image 17). Skull: Normal. Negative for fracture or focal lesion. Sinuses/Orbits: No acute finding. Other: None. IMPRESSION: 1. Hypoattenuation within right basal ganglia involving caudate nucleus, lentiform nucleus, and posterior limb of internal capsule compatible with acute/early subacute infarction. ASPECTS is 7. No hemorrhage. 2. Dense right M1, suspected thrombus. These results were called by telephone at the time of interpretation on 12/03/2018 at 8:32 pm to Dr. Donnetta Hutching , who verbally acknowledged these results. Electronically Signed   By: Mitzi Hansen M.D.   On: 12/03/2018 20:34   Dg Chest Port 1 View  Result  Date: 12/03/2018 CLINICAL DATA:  Cough EXAM: PORTABLE CHEST 1 VIEW COMPARISON:  11/14/2018 FINDINGS: Mild cardiomegaly. No confluent opacity, effusion or edema. No acute bony abnormality. IMPRESSION: Cardiomegaly.  No acute findings. Electronically Signed   By: Charlett Nose M.D.   On: 12/03/2018 19:21    Procedures Procedures (including critical care time)  Medications Ordered in ED Medications  sodium chloride 0.9 % bolus 500 mL (0 mLs Intravenous Stopped 12/03/18 2026)     Initial Impression / Assessment and Plan / ED Course  I have reviewed the triage vital signs and the nursing notes.  Pertinent labs & imaging results that were available during my care of the patient were reviewed by me and considered in my medical decision making (see chart for details).        Status post fall earlier today with altered mental status.  Physical exam reveals left-sided weakness and slurred speech.  CT scan suggests a right basilar ganglia throat.  Discussed with neurology.  Admit to general medicine.  No acute interventions at this time.   CRITICAL CARE Performed by: Donnetta Hutching Total critical care time: 35 minutes Critical care time was exclusive of separately billable procedures and treating other patients. Critical care was necessary to treat or prevent imminent or life-threatening deterioration. Critical care was time spent personally by me on the following activities: development of treatment plan with patient and/or surrogate as well as nursing, discussions with consultants, evaluation of patient's response to treatment, examination of patient, obtaining history from patient or surrogate, ordering and performing treatments and interventions, ordering and review of laboratory studies, ordering and review of radiographic studies, pulse oximetry and re-evaluation of patient's condition.  Final Clinical Impressions(s) / ED Diagnoses   Final diagnoses:  Fall, initial encounter  Altered mental  status, unspecified altered mental status type  Cerebrovascular accident (CVA), unspecified mechanism [facility])    ED Discharge Orders         Ordered    lidocaine (LIDODERM) 5 %  Every 24 hours     12/03/18 2019           Donnetta Hutching, MD 12/03/18 2153    Donnetta Hutching, MD 12/03/18 2156

## 2018-12-03 NOTE — Progress Notes (Addendum)
Received via stretcher from ED, unable to assist with stretcher/bed transfer.  A&O x 3, bilat pupils #4 sluggish,  Right gaze preference, NPO til SSS completed.  Applied tele, CCM notified.  Awaiting admit orders.

## 2018-12-03 NOTE — ED Notes (Signed)
Cancelled PTAR 

## 2018-12-03 NOTE — ED Notes (Addendum)
Spoke to [place], Charity fundraiser about mrs Sanger's LSN and it was 1330.

## 2018-12-03 NOTE — ED Triage Notes (Signed)
Patient BIB ems from [facility]. Staff stated she had an unwitnessed fall with no injuries or complaints other than they noticed her spO2 at low 80s. They placed her on 2L and got her up to 90s. Patient somewhat altered at baseline per EMS, she's Aox4 during triage.

## 2018-12-04 ENCOUNTER — Observation Stay (HOSPITAL_COMMUNITY): Payer: Medicare HMO

## 2018-12-04 ENCOUNTER — Other Ambulatory Visit (HOSPITAL_COMMUNITY): Payer: Medicare HMO

## 2018-12-04 ENCOUNTER — Inpatient Hospital Stay (HOSPITAL_COMMUNITY): Payer: Medicare HMO

## 2018-12-04 DIAGNOSIS — I63411 Cerebral infarction due to embolism of right middle cerebral artery: Secondary | ICD-10-CM | POA: Diagnosis present

## 2018-12-04 DIAGNOSIS — R29716 NIHSS score 16: Secondary | ICD-10-CM | POA: Diagnosis not present

## 2018-12-04 DIAGNOSIS — E785 Hyperlipidemia, unspecified: Secondary | ICD-10-CM | POA: Diagnosis present

## 2018-12-04 DIAGNOSIS — J9601 Acute respiratory failure with hypoxia: Secondary | ICD-10-CM | POA: Diagnosis present

## 2018-12-04 DIAGNOSIS — F419 Anxiety disorder, unspecified: Secondary | ICD-10-CM | POA: Diagnosis present

## 2018-12-04 DIAGNOSIS — I10 Essential (primary) hypertension: Secondary | ICD-10-CM | POA: Diagnosis present

## 2018-12-04 DIAGNOSIS — G8194 Hemiplegia, unspecified affecting left nondominant side: Secondary | ICD-10-CM | POA: Diagnosis present

## 2018-12-04 DIAGNOSIS — Z8719 Personal history of other diseases of the digestive system: Secondary | ICD-10-CM | POA: Diagnosis not present

## 2018-12-04 DIAGNOSIS — Z515 Encounter for palliative care: Secondary | ICD-10-CM | POA: Diagnosis not present

## 2018-12-04 DIAGNOSIS — R29700 NIHSS score 0: Secondary | ICD-10-CM | POA: Diagnosis present

## 2018-12-04 DIAGNOSIS — R1310 Dysphagia, unspecified: Secondary | ICD-10-CM | POA: Diagnosis not present

## 2018-12-04 DIAGNOSIS — R414 Neurologic neglect syndrome: Secondary | ICD-10-CM | POA: Diagnosis present

## 2018-12-04 DIAGNOSIS — R471 Dysarthria and anarthria: Secondary | ICD-10-CM | POA: Diagnosis present

## 2018-12-04 DIAGNOSIS — R29810 Facial weakness: Secondary | ICD-10-CM | POA: Diagnosis present

## 2018-12-04 DIAGNOSIS — I4821 Permanent atrial fibrillation: Secondary | ICD-10-CM | POA: Diagnosis present

## 2018-12-04 DIAGNOSIS — Y92129 Unspecified place in nursing home as the place of occurrence of the external cause: Secondary | ICD-10-CM | POA: Diagnosis not present

## 2018-12-04 DIAGNOSIS — Z7189 Other specified counseling: Secondary | ICD-10-CM | POA: Diagnosis not present

## 2018-12-04 DIAGNOSIS — R4701 Aphasia: Secondary | ICD-10-CM | POA: Diagnosis present

## 2018-12-04 DIAGNOSIS — I493 Ventricular premature depolarization: Secondary | ICD-10-CM | POA: Diagnosis present

## 2018-12-04 DIAGNOSIS — Z66 Do not resuscitate: Secondary | ICD-10-CM | POA: Diagnosis present

## 2018-12-04 DIAGNOSIS — R29714 NIHSS score 14: Secondary | ICD-10-CM | POA: Diagnosis not present

## 2018-12-04 DIAGNOSIS — R1312 Dysphagia, oropharyngeal phase: Secondary | ICD-10-CM | POA: Diagnosis present

## 2018-12-04 DIAGNOSIS — R4182 Altered mental status, unspecified: Secondary | ICD-10-CM | POA: Diagnosis not present

## 2018-12-04 DIAGNOSIS — Z6841 Body Mass Index (BMI) 40.0 and over, adult: Secondary | ICD-10-CM | POA: Diagnosis not present

## 2018-12-04 DIAGNOSIS — F329 Major depressive disorder, single episode, unspecified: Secondary | ICD-10-CM | POA: Diagnosis present

## 2018-12-04 DIAGNOSIS — D649 Anemia, unspecified: Secondary | ICD-10-CM | POA: Diagnosis present

## 2018-12-04 DIAGNOSIS — H518 Other specified disorders of binocular movement: Secondary | ICD-10-CM | POA: Diagnosis present

## 2018-12-04 DIAGNOSIS — W19XXXA Unspecified fall, initial encounter: Secondary | ICD-10-CM | POA: Diagnosis present

## 2018-12-04 DIAGNOSIS — I639 Cerebral infarction, unspecified: Secondary | ICD-10-CM

## 2018-12-04 DIAGNOSIS — M4856XA Collapsed vertebra, not elsewhere classified, lumbar region, initial encounter for fracture: Secondary | ICD-10-CM | POA: Diagnosis present

## 2018-12-04 LAB — CBC
HEMATOCRIT: 31 % — AB (ref 36.0–46.0)
Hemoglobin: 9.8 g/dL — ABNORMAL LOW (ref 12.0–15.0)
MCH: 29.9 pg (ref 26.0–34.0)
MCHC: 31.6 g/dL (ref 30.0–36.0)
MCV: 94.5 fL (ref 80.0–100.0)
Platelets: 291 10*3/uL (ref 150–400)
RBC: 3.28 MIL/uL — ABNORMAL LOW (ref 3.87–5.11)
RDW: 14.9 % (ref 11.5–15.5)
WBC: 6 10*3/uL (ref 4.0–10.5)
nRBC: 0 % (ref 0.0–0.2)

## 2018-12-04 LAB — BASIC METABOLIC PANEL
Anion gap: 9 (ref 5–15)
BUN: 7 mg/dL — ABNORMAL LOW (ref 8–23)
CO2: 23 mmol/L (ref 22–32)
Calcium: 8.7 mg/dL — ABNORMAL LOW (ref 8.9–10.3)
Chloride: 105 mmol/L (ref 98–111)
Creatinine, Ser: 0.78 mg/dL (ref 0.44–1.00)
GFR calc Af Amer: >60 mL/min (ref >60)
GFR calc non Af Amer: >60 mL/min (ref >60)
Glucose, Bld: 102 mg/dL — ABNORMAL HIGH (ref 70–99)
Potassium: 4.1 mmol/L (ref 3.5–5.1)
Sodium: 137 mmol/L (ref 135–145)

## 2018-12-04 LAB — LIPID PANEL
Cholesterol: 156 mg/dL (ref 0–200)
HDL: 62 mg/dL (ref >40)
LDL Cholesterol: 83 mg/dL (ref 0–99)
Total CHOL/HDL Ratio: 2.5 RATIO
Triglycerides: 55 mg/dL (ref <150)
VLDL: 11 mg/dL (ref 0–40)

## 2018-12-04 LAB — URINALYSIS, ROUTINE W REFLEX MICROSCOPIC
Bilirubin Urine: NEGATIVE
Glucose, UA: NEGATIVE mg/dL
Hgb urine dipstick: NEGATIVE
Ketones, ur: NEGATIVE mg/dL
Leukocytes,Ua: NEGATIVE
Nitrite: NEGATIVE
Protein, ur: NEGATIVE mg/dL
SPECIFIC GRAVITY, URINE: 1.008 (ref 1.005–1.030)
pH: 5 (ref 5.0–8.0)

## 2018-12-04 LAB — HEMOGLOBIN A1C
Hgb A1c MFr Bld: 4.9 % (ref 4.8–5.6)
Mean Plasma Glucose: 93.93 mg/dL

## 2018-12-04 MED ORDER — DILTIAZEM HCL-DEXTROSE 100-5 MG/100ML-% IV SOLN (PREMIX)
5.0000 mg/h | INTRAVENOUS | Status: DC
  Administered 2018-12-04: 5 mg/h via INTRAVENOUS
  Administered 2018-12-05 – 2018-12-06 (×5): 15 mg/h via INTRAVENOUS
  Filled 2018-12-04 (×7): qty 100

## 2018-12-04 MED ORDER — SODIUM CHLORIDE 0.9% FLUSH
10.0000 mL | Freq: Two times a day (BID) | INTRAVENOUS | Status: DC
  Administered 2018-12-04 – 2018-12-24 (×29): 10 mL

## 2018-12-04 MED ORDER — MORPHINE SULFATE (PF) 2 MG/ML IV SOLN
1.0000 mg | INTRAVENOUS | Status: DC | PRN
  Administered 2018-12-04 – 2018-12-05 (×4): 1 mg via INTRAVENOUS
  Filled 2018-12-04 (×5): qty 1

## 2018-12-04 MED ORDER — IOPAMIDOL (ISOVUE-370) INJECTION 76%: 100.0000 mL | Freq: Once | INTRAVENOUS | Status: DC

## 2018-12-04 MED ORDER — KETOROLAC TROMETHAMINE 15 MG/ML IJ SOLN
INTRAMUSCULAR | Status: AC
  Filled 2018-12-04: qty 1

## 2018-12-04 MED ORDER — KETOROLAC TROMETHAMINE 15 MG/ML IJ SOLN
15.0000 mg | Freq: Once | INTRAMUSCULAR | Status: AC
  Administered 2018-12-04: 15 mg via INTRAVENOUS

## 2018-12-04 MED ORDER — METOPROLOL TARTRATE 5 MG/5ML IV SOLN
5.0000 mg | Freq: Four times a day (QID) | INTRAVENOUS | Status: DC | PRN
  Administered 2018-12-04 – 2018-12-09 (×6): 5 mg via INTRAVENOUS
  Filled 2018-12-04 (×6): qty 5

## 2018-12-04 MED ORDER — DEXTROSE-NACL 5-0.9 % IV SOLN
INTRAVENOUS | Status: AC
  Administered 2018-12-04 – 2018-12-07 (×5): via INTRAVENOUS

## 2018-12-04 MED ORDER — SODIUM CHLORIDE 0.9% FLUSH
10.0000 mL | INTRAVENOUS | Status: DC | PRN
  Administered 2018-12-08: 40 mL
  Filled 2018-12-04: qty 40

## 2018-12-04 MED ORDER — KETOROLAC TROMETHAMINE 30 MG/ML IJ SOLN
30.0000 mg | Freq: Once | INTRAMUSCULAR | Status: AC
  Administered 2018-12-04: 30 mg via INTRAVENOUS
  Filled 2018-12-04: qty 1

## 2018-12-04 MED ORDER — METOPROLOL TARTRATE 5 MG/5ML IV SOLN
5.0000 mg | Freq: Once | INTRAVENOUS | Status: AC
  Administered 2018-12-04: 5 mg via INTRAVENOUS
  Filled 2018-12-04: qty 5

## 2018-12-04 NOTE — Progress Notes (Addendum)
STROKE TEAM PROGRESS NOTE   INTERVAL HISTORY Her RN is at the bedside.  No family present. She remains with L flaccid HP. Infarct appears embolic d/t AF. Attempted CTA/CTP early am for possible intervention, but unable to get d/t lack of appropriate IV access.   Vitals:   12/04/18 0300 12/04/18 0500 12/04/18 0747 12/04/18 1133  BP: (!) 158/88 (!) 160/84 (!) 185/84 (!) 172/93  Pulse: 100 (!) 101 (!) 108 (!) 113  Resp: (!) 26 20 (!) 21 (!) 22  Temp: 98.4 F (36.9 C) 98.4 F (36.9 C) 98.5 F (36.9 C) 98.5 F (36.9 C)  TempSrc: Oral Oral Oral Axillary  SpO2: 100% 100% 100% 100%  Weight:      Height:        CBC:  Recent Labs  Lab 12/03/18 1956 12/04/18 0536  WBC 7.5 6.0  NEUTROABS 5.6  --   HGB 10.2* 9.8*  HCT 32.7* 31.0*  MCV 95.1 94.5  PLT 319 291    Basic Metabolic Panel:  Recent Labs  Lab 12/03/18 1956 12/04/18 0536  NA 136 137  K 5.3* 4.1  CL 104 105  CO2 24 23  GLUCOSE 94 102*  BUN 10 7*  CREATININE 0.94 0.78  CALCIUM 9.1 8.7*   Lipid Panel:     Component Value Date/Time   CHOL 156 12/04/2018 0536   TRIG 55 12/04/2018 0536   HDL 62 12/04/2018 0536   CHOLHDL 2.5 12/04/2018 0536   VLDL 11 12/04/2018 0536   LDLCALC 83 12/04/2018 0536   HgbA1c:  Lab Results  Component Value Date   HGBA1C 4.9 12/04/2018   Urine Drug Screen: No results found for: LABOPIA, COCAINSCRNUR, LABBENZ, AMPHETMU, THCU, LABBARB  Alcohol Level No results found for: [facility]  IMAGING Dg Thoracic Spine 2 View  Result Date: 12/04/2018 CLINICAL DATA:  Fall yesterday with upper back pain, initial encounter EXAM: THORACIC SPINE 2 VIEWS COMPARISON:  None. FINDINGS: Vertebral body height is well maintained. Mild osteophytic changes are seen. Pedicles are within normal limits and no paraspinal mass is seen. The visualize ribcage shows no abnormality. IMPRESSION: Degenerative change without acute abnormality. Electronically Signed   By: Alcide Clever M.D.   On: 12/04/2018 10:15   Dg Lumbar  Spine 2-3 Views  Result Date: 12/04/2018 CLINICAL DATA:  Recent fall with back pain, initial encounter EXAM: LUMBAR SPINE - 2-3 VIEW COMPARISON:  02/15/18 FINDINGS: Five lumbar type vertebral bodies are well visualized. Vertebral body height is well maintained with the exception of a chronic compression deformity of L1. No new focal abnormality is seen. Mild osteophytic changes are noted. IMPRESSION: Chronic L1 compression deformity.  No acute abnormality seen. Electronically Signed   By: Alcide Clever M.D.   On: 12/04/2018 10:16   Ct Head Wo Contrast  Result Date: 12/04/2018 CLINICAL DATA:  Follow up stroke. History of hypertension trauma hypercholesterolemia. EXAM: CT HEAD WITHOUT CONTRAST TECHNIQUE: Contiguous axial images were obtained from the base of the skull through the vertex without intravenous contrast. COMPARISON:  CT HEAD December 03, 2018 FINDINGS: BRAIN: RIGHT basal ganglia and internal capsule loss of gray-white matter differentiation. No intraparenchymal hemorrhage, mass effect, midline shift. No abnormal extra-axial fluid collections. VASCULAR: Mildly hyperdense RIGHT M1 segment. Mild calcific atherosclerosis carotid siphons. SKULL: No skull fracture. No significant scalp soft tissue swelling. SINUSES/ORBITS: Trace paranasal sinus mucosal thickening. Mastoid air cells are well aerated.The included ocular globes and orbital contents are non-suspicious. OTHER: None. IMPRESSION: 1. Acute nonhemorrhagic RIGHT basal ganglia/internal capsule MCA territory infarct.  2. Suspected RIGHT M1 thromboembolism. Electronically Signed   By: Awilda Metro M.D.   On: 12/04/2018 05:43   Ct Head Wo Contrast  Result Date: 12/03/2018 CLINICAL DATA:  73 y/o F; unwitnessed fall, altered level of consciousness. EXAM: CT HEAD WITHOUT CONTRAST TECHNIQUE: Contiguous axial images were obtained from the base of the skull through the vertex without intravenous contrast. COMPARISON:  08/19/2018 CT head. 09/24/2018 MRI  head. FINDINGS: Brain: Hypoattenuation within the right basal ganglia involving the caudate nucleus, lentiform nucleus, and posterior limb of internal capsule compatible with acute/early subacute infarction. ASPECTS is 7. no additional findings of stroke, hemorrhage, extra-axial collection, hydrocephalus, or herniation. Vascular: Dense right M1, suspected thrombus (series 3, image 17). Skull: Normal. Negative for fracture or focal lesion. Sinuses/Orbits: No acute finding. Other: None. IMPRESSION: 1. Hypoattenuation within right basal ganglia involving caudate nucleus, lentiform nucleus, and posterior limb of internal capsule compatible with acute/early subacute infarction. ASPECTS is 7. No hemorrhage. 2. Dense right M1, suspected thrombus. These results were called by telephone at the time of interpretation on 12/03/2018 at 8:32 pm to Dr. Donnetta Hutching , who verbally acknowledged these results. Electronically Signed   By: Mitzi Hansen M.D.   On: 12/03/2018 20:34   Mr Brain Wo Contrast  Result Date: 12/04/2018 CLINICAL DATA:  Stroke.  Atrial fibrillation. EXAM: MRI HEAD WITHOUT CONTRAST TECHNIQUE: Multiplanar, multiecho pulse sequences of the brain and surrounding structures were obtained without intravenous contrast. COMPARISON:  CT head 12/04/2018 FINDINGS: Brain: Acute infarct right basal ganglia involving the body and tail of caudate and most of the putamen. Relative sparing of the internal capsule and head of caudate. Additional small areas of acute infarct in the right posterior temporal lobe. Negative for hemorrhage. Ventricle size normal. Small chronic infarct left lateral basal ganglia. No other significant chronic ischemia. Negative for mass or edema. No midline shift. Vascular: Normal arterial flow voids in the internal carotid arteries and M1 segments. There is FLAIR hyperintensity in right M2 branch which is likely due to clot corresponding to hyperdensity on CT. Normal flow void in the  vertebral arteries and basilar. Skull and upper cervical spine: Negative Sinuses/Orbits: Negative Other: None IMPRESSION: Acute infarct in the right basal ganglia. Smaller area of acute infarct right posterior temporal lobe FLAIR hyperintensity right M2 segment. Right M1 segment appears patent. These results were called by telephone at the time of interpretation on 12/04/2018 at 11:16 am to Dr. Roda Shutters , who verbally acknowledged these results. Electronically Signed   By: Marlan Palau M.D.   On: 12/04/2018 11:17   Dg Chest Port 1 View  Result Date: 12/03/2018 CLINICAL DATA:  Cough EXAM: PORTABLE CHEST 1 VIEW COMPARISON:  11/14/2018 FINDINGS: Mild cardiomegaly. No confluent opacity, effusion or edema. No acute bony abnormality. IMPRESSION: Cardiomegaly.  No acute findings. Electronically Signed   By: Charlett Nose M.D.   On: 12/03/2018 19:21   2D Echocardiogram  11/09/2018  1. The left ventricle has normal systolic function of 60-65%. The cavity size is normal. There is moderate concentric left ventricular wall hypertrophy. . The left ventricular diastology could not be evaluatedsecondary to atrial fibrillation.  2. Severely dilated left atrial size.  3. There is moderate mitral annular calcification present. Mitral regurgitation is trivial by color flow Doppler.  4. No atrial level shunt detected by color flow Doppler.   PHYSICAL EXAM HEENT: [place]/AT Cardiac:  Tachycardia 110s which jump up to 130s spontaneously Lungs: CTA, respirations unremarkable Ext: Warm and well perfused  Neurologic Examination: Mental  Status: lethargic. Will awake with continued stimulation. Oriented to person, place, situation. Able to follow all right sided commands. Pained affect. Speech dysarthric but fluent with intact comprehension. I could not get her to name objects, but would repeat and show 2 fingers.  Cranial Nerves: II:  dense Left visual field cut. PERRL.  III,IV, VI: Eyes conjugately deviated to the right. Able to  gaze voluntarily to the left, reaching the midline, but unable to cross midline to the left volitionally. No nystagmus.  V, VII: Left facial droop. Decreased reactivity to left sided facial stimuli.  VIII: hearing intact to voice IX,X: No hypophonia XI: Head preferentially rotated to right XII: Mild lingual dysarthria Motor: RUE 4+/5 RLE: 4/5 LUE: Flaccid with no volitional movement or withdrawal to noxious LLE: Flaccid tone with no volitional movement. Triple flexion response to noxious stimuli Sensory:  Sensation intact to temp and FT, RUE and RLE.  Insensate to temp, FT and deep pressure, LUE and LLE. Cerebellar: No ataxia with FNF on the right. Unable to perform on the left.  Plantars: Right downgoing, Left upgoing Gait: Unable to assess.   ASSESSMENT/PLAN Ms. JAMICE CARRENO is a 73 y.o. L handed  female with history of AF not on AC d/t hx GIB, recent admission for the flu (independent PTA, d/c to SNF d/t deconditioning) presenting from SNF following unwitnessed fall, found to have low O2 sats and altered mental status, L HP. CT here showed a R BG infarct.   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 ASPECTS 7. Dense R M1  CT head 2/28 0543 R BG/IC infarct. R M1 thomboembolism  Ordered CTA head & neck, CT perfusion 2/28 0647 but could not get IV access  MRI  R BG infarcts. Small R posterior temporal lobe infarct. Flair hyperintensity R M2. R M1 patent.   MRA pending  Carotid Doppler  pending   2D Echo  11/09/2018 EF 60-65%. No source of embolus seen. In AF.  LDL 83  HgbA1c 4.9  Lovenox 40 mg sq daily for VTE prophylaxis  No antithrombotic prior to admission, now on aspirin 300 mg suppository daily as NPO. Continue for now.   Therapy recommendations:  pending   Disposition:  pending (from [facility] PTA, there his month following hospitalization for flu. 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 Cardiology recommended both EGD and colonoscopy prior to [facility] consideration  No AC candidate at this time . Consider AC depending on oral route and GIB/anemia status  Dysphagia  Secondary to stroke  Failed stroke swallow screen Diet Order            Diet NPO time specified  Diet effective now              SLP consulted  Hypertension  Stable, on the high end . Permissive hypertension (OK if < 220/120) but gradually normalize in 5-7 days . Long-term BP goal normotensive  Hyperlipidemia  Home meds:  No statin  LDL 83, goal < 70  Add statin once able to swallow or access obtained  Other Stroke Risk Factors  Advanced age  Morbid Obesity, Body mass index is 42.1 kg/m., has had bariatric surgery in  the past (2000)  Other Active Problems  Anemia, Hgb 9.8, hx GIB. EGD 11/17/2018 w/ ulcer at GJ anastomosis but no visible vessel   Depression and anxiety  Recent admission for the flu, d/c to SNF 11/04/2018 - 11/19/2018 - acute respiratory failure w/ hypoxia, low Na, HTN, depression/anxiety, lok K, suspected multifocal PNA, UGIB/melena - d/c on abx, protonix  Hospital day # 0  Annie Main, MSN, APRN, ANVP-BC, AGPCNP-BC Advanced Practice Stroke Nurse [facility] See Amion for Schedule & Pager information 12/04/2018 1:57 PM   ATTENDING NOTE: I reviewed above note and agree with the assessment and plan. Pt was seen and examined.   73 year old female with history of hypertension, hyperlipidemia, A. fib was on Eliquis before but now stopped due to GI bleeding required transfusion.  She was recently discharged to SNF after flu illness.  Yesterday she was seen SNF and had a fall with altered mental status, confusion and  left-sided weakness.  CT showed right basal ganglia early sign of new infarct, aspect score 7.  CT head also showed right M1 hyperdense sign.  However, she was considered not a TPA candidate due to outside window, not endovascular candidate due to established right BG infarct.  CT head and neck not successful due to no IV access.  MRI showed right BG moderate infarct as well as right MCA punctate cortical infarct.  Carotid Doppler negative.  TTE on 11/09/2018 showed EF 60 to 65%.  EKG showed A. fib.  MRA head pending.  LDL 83 and A1c 4.9.  On exam, patient lethargic, but awake alert, orientated x3.  No aphasia, but paucity of speech, able to name and repeat, following commands.  Right gaze preference, occasionally cross midline to the left.  Left hemianopia, left visual neglect.  PERRL.  Left facial droop, mild to moderate dysarthria.  Tongue midline in mouth.  Right upper and lower extremity spontaneous movement against gravity.  Left upper extremity flaccid 0/5, left lower extremity no spontaneous movement, mild withdraw to pain.  Sensation and coordination testing not corporative, DTR 1+, no Babinski.  Gait not tested.  Patient also has A. fib RVR on telemetry monitoring, heart rate around 130-140s.  Patient stroke likely due to cardioembolic given A. fib not on anticoagulation.  She did not pass swallow, currently n.p.o.  On aspirin PR.  Given A. fib RVR, started on Cardizem drip.  PT/OT/speech.  Will follow.    Marvel Plan, MD PhD Stroke Neurology 12/04/2018 5:13 PM  I spent  35 minutes in total face-to-face time with the patient, more than 50% of which was spent in counseling and coordination of care, reviewing test results, images and medication, and discussing the diagnosis of right MCA moderate stroke, right M1 occlusion, A. fib RVR, left hemiplegia, treatment plan and potential prognosis. This patient's care requiresreview of multiple databases, neurological assessment, discussion with family,  other specialists and medical decision making of high complexity.     To contact Stroke Continuity provider, please refer to [email or web address]. After hours, contact General Neurology

## 2018-12-04 NOTE — Evaluation (Signed)
Occupational Therapy Evaluation Patient Details [name] MRN: [id number] DOB: 1946-01-16 Today's Date: 12/04/2018    History of Present Illness 73 y.o. female with medical history significant for depression with anxiety, hypertension, atrial fibrillation not anticoagulated due to recurrent GI bleeding, now presenting to the ED from her nursing facility for evaluation of left-sided weakness, dysarthria, and fall. 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 Smaller area of acute infarct right posterior temporal lobe. Pt with recent hospital admission and discharged to SNF on 2/13.    Clinical Impression   This 73 y/o female presents with the above. Prior to this admission Pt was in SNF [facility]) for ST rehab services. Per chart review and partly per pt report, prior to previous hospital admission she was residing at home and completing ADL and mobility at mod independent level. Pt lethargic this session but does arouse given multimodal cues, requiring intermittent cues during session to remain awake. Limited eval as pt with elevated HR (120s/140s with max HR briefly to 175) and elevated BP (185/110 (131)) in supine. Pt requiring mod-maxA+2 for rolling to L/R at bed level to change soiled bed linens after episode of incontinence; she currently requires minA for simple face washing task, requires max-totalA for all other aspects of ADL at this time. Pt presenting with flaccid LUE, cognitive and visual deficits including L inattention/neglect also impacting her ADL and mobility status. She will benefit from continued acute OT services and recommend return to SNF settting after discharge for further follow up therapy services to maximize her safety and independence with ADL and mobility. Will follow.    Follow Up Recommendations  SNF;Supervision/Assistance - 24 hour    Equipment Recommendations  Other (comment)(TBD in next venue)           Precautions /  Restrictions Precautions Precautions: Fall Precaution Comments: monitor HR Restrictions Weight Bearing Restrictions: No      Mobility Bed Mobility Overal bed mobility: Needs Assistance Bed Mobility: Rolling Rolling: Mod assist;Max assist;+2 for physical assistance;+2 for safety/equipment         General bed mobility comments: modA+2 rolling towards L; maxA+2 rolling towards R  Transfers                 General transfer comment: unable this session due to elevated HR and BP     Balance                                           ADL either performed or assessed with clinical judgement   ADL Overall ADL's : Needs assistance/impaired Eating/Feeding: NPO   Grooming: Set up;Min guard;Bed level;Wash/dry face Grooming Details (indicate cue type and reason): cues to initiate with pt able to perform                               General ADL Comments: pt currently requires totalA for all other ADL at this time, limited eval due to elevated HR and BP; pt incontinent of urine during session requires rolling at bed level to change sheets     Vision   Vision Assessment?: Yes Eye Alignment: Impaired (comment) Ocular Range of Motion: Impaired-to be further tested in functional context(unable to track past midline towards L visual field) Alignment/Gaze Preference: Gaze right Tracking/Visual Pursuits: Unable to hold eye position out  of midline;Impaired - to be further tested in functional context Additional Comments: significant L inattention pt able to track and turn head towards midline position but does not move towards L visual field despite max cues; tends to maintain gaze R     Perception     Praxis      Pertinent Vitals/Pain Pain Assessment: Faces Faces Pain Scale: Hurts little more Pain Location: back pain Pain Descriptors / Indicators: Discomfort;Aching Pain Intervention(s): Monitored during session;Patient requesting pain meds-RN  notified;Repositioned     Hand Dominance     Extremity/Trunk Assessment Upper Extremity Assessment Upper Extremity Assessment: LUE deficits/detail LUE Deficits / Details: flaccid LUE, mild hypertonicity noted with elbow PROM LUE Sensation: decreased light touch LUE Coordination: decreased fine motor;decreased gross motor   Lower Extremity Assessment Lower Extremity Assessment: Defer to PT evaluation       Communication Communication Communication: No difficulties   Cognition Arousal/Alertness: Lethargic Behavior During Therapy: Flat affect Overall Cognitive Status: Impaired/Different from baseline Area of Impairment: Orientation;Following commands;Memory;Problem solving                 Orientation Level: (disoriented to most recent living situation )   Memory: Decreased short-term memory Following Commands: Follows one step commands with increased time;Follows one step commands inconsistently     Problem Solving: Slow processing;Decreased initiation;Requires verbal cues;Requires tactile cues General Comments: pt A&Ox4 though unaware of most recently being in SNF, difficulty to fully assess as pt lethargic this session and requires cues to remain awake; max multimodal cues to follow one step commands   General Comments  Pt with elevated BP and HR this session; BP 185/110 (131) and HR fluctuating up to 120s-140s with max HR noted 175 briefly; SpO2 99-100% on RA    Exercises     Shoulder Instructions      Home Living Family/patient expects to be discharged to:: Skilled nursing facility Living Arrangements: Alone Available Help at Discharge: Family                             Additional Comments: [facility], living at home prior to this      Prior Functioning/Environment Level of Independence: Independent with assistive device(s)  Gait / Transfers Assistance Needed: prior to most recent SNF stay, pt was using SPC in  community, furniture walking in home ADL's / Homemaking Assistance Needed: Prior to initial discharge to SNF pt was completing ADL   Comments: some info obtained from previous admission as pt with fluctuating levels of lethargy and ability to follow commands; pt was most recently in SNF but she does not recall         OT Problem List: Decreased strength;Decreased range of motion;Decreased activity tolerance;Impaired vision/perception;Decreased cognition;Impaired UE functional use;Obesity;Impaired tone      OT Treatment/Interventions: Self-care/ADL training;Therapeutic exercise;Neuromuscular education;Therapeutic activities;Patient/family education;Balance training;Cognitive remediation/compensation;Visual/perceptual remediation/compensation;DME and/or AE instruction    OT Goals(Current goals can be found in the care plan section) Acute Rehab OT Goals Patient Stated Goal: less pain OT Goal Formulation: With patient Time For Goal Achievement: 12/18/18 Potential to Achieve Goals: Fair  OT Frequency: Min 2X/week   Barriers to D/C:            Co-evaluation              AM-PAC OT "6 Clicks" Daily Activity     Outcome Measure Help from another person eating meals?: Total(NPO) Help from another  person taking care of personal grooming?: A Lot Help from another person toileting, which includes using toliet, bedpan, or urinal?: Total Help from another person bathing (including washing, rinsing, drying)?: Total Help from another person to put on and taking off regular upper body clothing?: Total Help from another person to put on and taking off regular lower body clothing?: Total 6 Click Score: 7   End of Session Nurse Communication: Mobility status  Activity Tolerance: Patient tolerated treatment well;Treatment limited secondary to medical complications (Comment) Patient left: in bed;with call bell/phone within reach;with bed alarm set  OT Visit Diagnosis: Muscle weakness  (generalized) (M62.81);Hemiplegia and hemiparesis Hemiplegia - Right/Left: Left Hemiplegia - caused by: Cerebral infarction                Time: 1137-1202 OT Time Calculation (min): 25 min Charges:  OT General Charges $OT Visit: 1 Visit OT Evaluation $OT Eval Moderate Complexity: 1 Mod  Marcy Siren, OT [employer] Pager [phone] Office [phone]   Orlando Penner 12/04/2018, 2:19 PM

## 2018-12-04 NOTE — [place] FL2 (Signed)
[place] MEDICAID FL2 LEVEL OF CARE SCREENING TOOL     IDENTIFICATION  Patient [name] Birthdate: 1945/11/10 Sex: female Admission Date (Current Location): 12/03/2018  [facility] and [place] Number:  Producer, television/film/video and Address:  [facility], [address], [place], [place] [address]      Provider Number: [id number]  Attending Physician Name and Address:  [place], Alvira Philips, DO  Relative Name and Phone Number:       Current Level of Care: Hospital Recommended Level of Care: Skilled Nursing Facility Prior Approval Number:    Date Approved/Denied:   PASRR Number:    Discharge Plan: SNF    Current Diagnoses: Patient Active Problem List   Diagnosis Date Noted  . Acute ischemic stroke (HCC) 12/03/2018  . History of GI bleed 12/03/2018  . Depression 12/03/2018  . Hypertension 12/03/2018  . Gastric ulcer with hemorrhage   . Melena   . Unspecified atrial fibrillation (HCC) 11/10/2018  . Hypertensive urgency 11/05/2018  . Anemia 11/05/2018    Orientation RESPIRATION BLADDER Height & Weight     Self, Time, Situation, Place  Normal Incontinent Weight: 237 lb 10.5 oz (107.8 kg) Height:  5\' 3"  (160 cm)  BEHAVIORAL SYMPTOMS/MOOD NEUROLOGICAL BOWEL NUTRITION STATUS      Continent Diet(see DC summary)  AMBULATORY STATUS COMMUNICATION OF NEEDS Skin   Limited Assist Verbally Normal                       Personal Care Assistance Level of Assistance  Bathing, Feeding, Dressing Bathing Assistance: Limited assistance Feeding assistance: Independent Dressing Assistance: Limited assistance     Functional Limitations Info  Sight, Hearing, Speech Sight Info: Impaired Hearing Info: Adequate Speech Info: Adequate    SPECIAL CARE FACTORS FREQUENCY  PT (By licensed PT), OT (By licensed OT)     PT Frequency: 5x/wk OT Frequency: 5x/wk            Contractures Contractures Info: Not present    Additional Factors Info   Code Status, Allergies, Psychotropic Code Status Info: DNR Allergies Info: Demerol Meperidine, Penicillins, Prozac Fluoxetine Hcl, Serzone Nefazodone, Sulfa Antibiotics Psychotropic Info: Lexapro 10mg  daily; Remeron 45 mg daily at bed; Seroquel 50mg  daily at bed; Effexor 50mg  2x/day          Current Medications (12/04/2018):  This is the current hospital active medication list Current Facility-Administered Medications  Medication Dose Route Frequency Provider Last Rate Last Dose  . acetaminophen (TYLENOL) tablet 650 mg  650 mg Oral Q4H PRN Opyd, Lavone Neri, MD       Or  . acetaminophen (TYLENOL) solution 650 mg  650 mg Per Tube Q4H PRN Opyd, Lavone Neri, MD       Or  . acetaminophen (TYLENOL) suppository 650 mg  650 mg Rectal Q4H PRN Opyd, Lavone Neri, MD      . aspirin suppository 300 mg  300 mg Rectal Daily Opyd, Lavone Neri, MD       Or  . aspirin tablet 325 mg  325 mg Oral Daily Opyd, Timothy S, MD      . dextrose 5 %-0.9 % sodium chloride infusion   Intravenous Continuous [place], Eric J, DO      . enoxaparin (LOVENOX) injection 40 mg  40 mg Subcutaneous Q24H Opyd, Timothy S, MD      . escitalopram (LEXAPRO) tablet 10 mg  10 mg Oral Daily Opyd, Lavone Neri, MD      .  guaiFENesin (MUCINEX) 12 hr tablet 600 mg  600 mg Oral BID PRN Opyd, Lavone Neri, MD      . iopamidol (ISOVUE-370) 76 % injection 100 mL  100 mL Intravenous Once Marvel Plan, MD      . LORazepam (ATIVAN) tablet 0.5 mg  0.5 mg Oral QHS PRN Opyd, Lavone Neri, MD      . mirtazapine (REMERON) tablet 45 mg  45 mg Oral QHS Opyd, Lavone Neri, MD      . morphine 2 MG/ML injection 1 mg  1 mg Intravenous Q4H PRN [place], Eric J, DO      . pantoprazole (PROTONIX) EC tablet 40 mg  40 mg Oral BID Opyd, Lavone Neri, MD      . QUEtiapine (SEROQUEL) tablet 50 mg  50 mg Oral QHS Opyd, Lavone Neri, MD      . senna-docusate (Senokot-S) tablet 1 tablet  1 tablet Oral QHS PRN Opyd, Lavone Neri, MD      . sodium chloride flush (NS) 0.9 % injection 10-40 mL   10-40 mL Intracatheter Q12H [place], Eric J, DO      . sodium chloride flush (NS) 0.9 % injection 10-40 mL  10-40 mL Intracatheter PRN [place], Eric J, DO      . venlafaxine [facility]) tablet 50 mg  50 mg Oral BID Opyd, Lavone Neri, MD         Discharge Medications: Please see discharge summary for a list of discharge medications.  Relevant Imaging Results:  Relevant Lab Results:   Additional Information SS#: [id number]  Baldemar Lenis, LCSW

## 2018-12-04 NOTE — Progress Notes (Signed)
PROGRESS NOTE    Adrienne Stone  NLZ:[id number] DOB: [birth year] DOA: 12/03/2018 PCP: [name], PA-C   Chief complaint: Left-sided weakness, slurred speech, fall  Brief Narrative:   Adrienne Stone is a 73 y.o.  left-handed female who previously worked as a Scientist, clinical (histocompatibility and immunogenetics) with medical history significant for depression with anxiety, hypertension, atrial fibrillation not anticoagulated due to recurrent GI bleeding, now presenting to the emergency department from her nursing facility for evaluation of left-sided weakness, dysarthria, and fall. Patient is accompanied by her sister who assists with the history.  Nursing facility personnel reported that the patient fell at approximately 1:30 PM, but had told the patient's sister that she fell at approximately 5 PM.  Around the same time, she was noted to have a new speech difficulty and was not moving her left side very well.  EMS was called and she was brought into the ED for evaluation of this.  Patient's ability to provide a history is limited by her severe dysarthria.  Her sister was with her yesterday, she seemed to be in her usual state at that time, and was not complaining of anything.  ED Course: Upon arrival to the ED, patient is found to be afebrile and mildly hypertensive.  EKG features atrial fibrillation with PVC.  Chest x-ray is notable for cardiomegaly, but no acute finding.  Noncontrast head CT reveals hypoattenuation within the right basal ganglia concerning for acute/early subacute CVA, as well as a dense right M1 segment concerning for thrombus.  Chemistry panel features a potassium of 5.3 and bilirubin 1.3.  CBC is notable for a chronic normocytic anemia.  Troponin is undetectable.  Patient was given 500 cc normal saline.  Neurology was consulted by the ED physician and recommended medical admission for further evaluation and management.  Assessment & Plan:   Principal Problem:   Acute ischemic stroke [facility]) Active Problems:    Anemia   Unspecified atrial fibrillation (HCC)   History of GI bleed   Depression   Hypertension   Acute ischemic CVA Patient presenting with acute onset left-sided weakness with slurred speech following a fall at her skilled nursing facility.  History of atrial fibrillation not on chronic anticoagulation secondary to recurrent GI bleeding.  Imaging significant for an acute right basal ganglia/internal capsule/right posterior temporal lobe ischemic stroke.  Patient continues with lethargy, difficulty with speech, left-sided weakness both upper and lower extremity. --Neurology following, appreciate assistance --Hemoglobin A1c 4.9, LDL 83, TTE on 2/3 w/ EF 60-65%, afib, no source of embolism noted --Carotid Doppler: Results pending --Pending CTA head/neck, MRA --PT/OT recommend SNF --Speech therapy recommends alternative means due to dysphasia --Continue aspirin PR until nutritional access obtained --allow permissive hypertension for 5-7 days per neurology  Oropharyngeal dysphasia Etiology secondary to acute CVA as above.  Speech therapy following, failed swallow evaluation, with recommendations of alternative means of nutrition. --N.p.o. --IV fluid hydration --will have NG tube placed --Nutrition consulted for tube feed recommendations  Hyperlipidemia LDL 83 with a goal less than 70.  HDL 62. --Plan to start statin when able to swallow versus NG tube placement  Anemia Hemoglobin 9.8 today.  History of GI bleed in the past with last EGD on 11/17/2018 with ulcer at that GJ anastomosis but no visible vessel.  No signs of active bleeding. --On aspirin as above for acute stroke, unlikely candidate for chronic anticoagulation due to her recurrent GI bleeds --Continue to monitor CBC daily  Acute hypoxic respiratory failure Recent admission for influenza with discharge to  SNF on 11/04/2018.  No history of lung disease.  --Currently requiring 2 L nasal cannula to maintain  oxygenation --Continue to titrate off with goal SPO2 greater than 92%.  Back pain T/L-spine x-rays notable for a chronic L1 compression fracture, otherwise no acute abnormalities. --We will continue pain control with IV morphine for now until oral access obtained.  Depression/anxiety --Will restart Lexapro, Remeron, Seroquel, Ativan, and Effexor when nutritional access obtained  Essential hypertension On clonidine 0.1 mg p.o. twice daily, Cardizem XR 360 mg p.o. daily, lisinopril 5 mg p.o. daily, metoprolol tartrate 75 mg p.o. twice daily at home. --Allowing permissive hypertension as above for acute stroke --Will restart over next few days.  Permanent atrial fibrillation EKG notable for persistent/permanent A. fib.  Not on chronic anticoagulation due to recurrent GI bleeds, most recently in February 2020.  Etiology of her stroke likely thrombotic event possibly from underlying A. Fib. --Continue aspirin as above --Currently rate controlled --Holding home Cardizem/metoprolol as above --Metoprolol 5 mg IV q6h prn for HR >105 sustained for 5 minutes --Continue to monitor on telemetry   DVT prophylaxis: Lovenox Code Status: DNR Family Communication: Discussed extensively with Sister [name] at bedside today Disposition Plan: Continue inpatient hospitalization, further work-up with imaging, will need alternative means of nutrition with planned NG tube placement, may need PEG tube following further discussion with patient/family.   Consultants:   Neurology  Procedures:   Transthoracic echocardiogram 11/09/2018:  1. The left ventricle has normal systolic function of 60-65%. The cavity size is normal. There is moderate concentric left ventricular wall hypertrophy. . The left ventricular diastology could not be evaluatedsecondary to atrial fibrillation.  2. Severely dilated left atrial size.  3. There is moderate mitral annular calcification present. Mitral regurgitation is trivial by  color flow Doppler.  4. No atrial level shunt detected by color flow Doppler.  Ultrasound carotid 12/04/2018: Right Carotid: Velocities in the right ICA are consistent with a 1-39% stenosis. Left Carotid: Velocities in the left ICA are consistent with a 1-39% stenosis. Vertebrals: Bilateral vertebral arteries demonstrate antegrade flow. Right vertebral artery demonstrates high resistant flow.  Antimicrobials: none   Subjective: Patient seen and examined at bedside following return from MRI.  Sister present.  Continues to be tired/sleepy.  Complains of low back pain.  Unable to move left arm/leg.  Very weak.  No other complaints at this time.  Denies headache, no visual changes, no nausea/vomiting/diarrhea, no fever/chills/night sweats, no chest pain, no palpitations, no shortness of breath, no abdominal pain, no issues with bowel/bladder function.  No acute events overnight per nursing staff.  Objective: Vitals:   12/04/18 0300 12/04/18 0500 12/04/18 0747 12/04/18 1133  BP: (!) 158/88 (!) 160/84 (!) 185/84 (!) 172/93  Pulse: 100 (!) 101 (!) 108 (!) 113  Resp: (!) 26 20 (!) 21 (!) 22  Temp: 98.4 F (36.9 C) 98.4 F (36.9 C) 98.5 F (36.9 C) 98.5 F (36.9 C)  TempSrc: Oral Oral Oral Axillary  SpO2: 100% 100% 100% 100%  Weight:      Height:        Intake/Output Summary (Last 24 hours) at 12/04/2018 1611 Last data filed at 12/04/2018 0748 Gross per 24 hour  Intake 743.75 ml  Output 850 ml  Net -106.25 ml   Filed Weights   12/03/18 1808 12/03/18 2300  Weight: 107.5 kg 107.8 kg    Examination:  General exam: Appears calm and comfortable  Respiratory system: Clear to auscultation. Respiratory effort normal. Cardiovascular system: S1 &  S2 heard, RRR. No JVD, murmurs, rubs, gallops or clicks. No pedal edema. Gastrointestinal system: Abdomen is nondistended, soft and nontender. No organomegaly or masses felt. Normal bowel sounds heard. Central nervous system: Alert and oriented. No  focal neurological deficits. Extremities: Symmetric 5 x 5 power. Skin: No rashes, lesions or ulcers Psychiatry: Judgement and insight appear normal. Mood & affect appropriate.     Data Reviewed: I have personally reviewed following labs and imaging studies  CBC: Recent Labs  Lab 12/03/18 1956 12/04/18 0536  WBC 7.5 6.0  NEUTROABS 5.6  --   HGB 10.2* 9.8*  HCT 32.7* 31.0*  MCV 95.1 94.5  PLT 319 291   Basic Metabolic Panel: Recent Labs  Lab 12/03/18 1956 12/04/18 0536  NA 136 137  K 5.3* 4.1  CL 104 105  CO2 24 23  GLUCOSE 94 102*  BUN 10 7*  CREATININE 0.94 0.78  CALCIUM 9.1 8.7*   GFR: Estimated Creatinine Clearance: 72.7 mL/min (by C-G formula based on SCr of 0.78 mg/dL). Liver Function Tests: Recent Labs  Lab 12/03/18 1956  AST 58*  ALT 14  ALKPHOS 40  BILITOT 1.3*  PROT 6.2*  ALBUMIN 3.3*   No results for input(s): LIPASE, AMYLASE in the last 168 hours. No results for input(s): AMMONIA in the last 168 hours. Coagulation Profile: No results for input(s): INR, PROTIME in the last 168 hours. Cardiac Enzymes: Recent Labs  Lab 12/03/18 1956  TROPONINI <0.03   BNP (last 3 results) No results for input(s): PROBNP in the last 8760 hours. HbA1C: Recent Labs    12/04/18 0536  HGBA1C 4.9   CBG: No results for input(s): GLUCAP in the last 168 hours. Lipid Profile: Recent Labs    12/04/18 0536  CHOL 156  HDL 62  LDLCALC 83  TRIG 55  CHOLHDL 2.5   Thyroid Function Tests: No results for input(s): TSH, T4TOTAL, FREET4, T3FREE, THYROIDAB in the last 72 hours. Anemia Panel: No results for input(s): VITAMINB12, FOLATE, FERRITIN, TIBC, IRON, RETICCTPCT in the last 72 hours. Sepsis Labs: No results for input(s): PROCALCITON, LATICACIDVEN in the last 168 hours.  No results found for this or any previous visit (from the past 240 hour(s)).       Radiology Studies: Dg Thoracic Spine 2 View  Result Date: 12/04/2018 CLINICAL DATA:  Fall yesterday  with upper back pain, initial encounter EXAM: THORACIC SPINE 2 VIEWS COMPARISON:  None. FINDINGS: Vertebral body height is well maintained. Mild osteophytic changes are seen. Pedicles are within normal limits and no paraspinal mass is seen. The visualize ribcage shows no abnormality. IMPRESSION: Degenerative change without acute abnormality. Electronically Signed   By: Alcide CleverMark  Lukens M.D.   On: 12/04/2018 10:15   Dg Lumbar Spine 2-3 Views  Result Date: 12/04/2018 CLINICAL DATA:  Recent fall with back pain, initial encounter EXAM: LUMBAR SPINE - 2-3 VIEW COMPARISON:  02/15/18 FINDINGS: Five lumbar type vertebral bodies are well visualized. Vertebral body height is well maintained with the exception of a chronic compression deformity of L1. No new focal abnormality is seen. Mild osteophytic changes are noted. IMPRESSION: Chronic L1 compression deformity.  No acute abnormality seen. Electronically Signed   By: Alcide CleverMark  Lukens M.D.   On: 12/04/2018 10:16   Ct Head Wo Contrast  Result Date: 12/04/2018 CLINICAL DATA:  Follow up stroke. History of hypertension trauma hypercholesterolemia. EXAM: CT HEAD WITHOUT CONTRAST TECHNIQUE: Contiguous axial images were obtained from the base of the skull through the vertex without intravenous contrast. COMPARISON:  CT HEAD December 03, 2018 FINDINGS: BRAIN: RIGHT basal ganglia and internal capsule loss of gray-white matter differentiation. No intraparenchymal hemorrhage, mass effect, midline shift. No abnormal extra-axial fluid collections. VASCULAR: Mildly hyperdense RIGHT M1 segment. Mild calcific atherosclerosis carotid siphons. SKULL: No skull fracture. No significant scalp soft tissue swelling. SINUSES/ORBITS: Trace paranasal sinus mucosal thickening. Mastoid air cells are well aerated.The included ocular globes and orbital contents are non-suspicious. OTHER: None. IMPRESSION: 1. Acute nonhemorrhagic RIGHT basal ganglia/internal capsule MCA territory infarct. 2. Suspected  RIGHT M1 thromboembolism. Electronically Signed   By: Awilda Metro M.D.   On: 12/04/2018 05:43   Ct Head Wo Contrast  Result Date: 12/03/2018 CLINICAL DATA:  73 y/o F; unwitnessed fall, altered level of consciousness. EXAM: CT HEAD WITHOUT CONTRAST TECHNIQUE: Contiguous axial images were obtained from the base of the skull through the vertex without intravenous contrast. COMPARISON:  08/19/2018 CT head. 09/24/2018 MRI head. FINDINGS: Brain: Hypoattenuation within the right basal ganglia involving the caudate nucleus, lentiform nucleus, and posterior limb of internal capsule compatible with acute/early subacute infarction. ASPECTS is 7. no additional findings of stroke, hemorrhage, extra-axial collection, hydrocephalus, or herniation. Vascular: Dense right M1, suspected thrombus (series 3, image 17). Skull: Normal. Negative for fracture or focal lesion. Sinuses/Orbits: No acute finding. Other: None. IMPRESSION: 1. Hypoattenuation within right basal ganglia involving caudate nucleus, lentiform nucleus, and posterior limb of internal capsule compatible with acute/early subacute infarction. ASPECTS is 7. No hemorrhage. 2. Dense right M1, suspected thrombus. These results were called by telephone at the time of interpretation on 12/03/2018 at 8:32 pm to Dr. Donnetta Hutching , who verbally acknowledged these results. Electronically Signed   By: Mitzi Hansen M.D.   On: 12/03/2018 20:34   Mr Brain Wo Contrast  Result Date: 12/04/2018 CLINICAL DATA:  Stroke.  Atrial fibrillation. EXAM: MRI HEAD WITHOUT CONTRAST TECHNIQUE: Multiplanar, multiecho pulse sequences of the brain and surrounding structures were obtained without intravenous contrast. COMPARISON:  CT head 12/04/2018 FINDINGS: Brain: Acute infarct right basal ganglia involving the body and tail of caudate and most of the putamen. Relative sparing of the internal capsule and head of caudate. Additional small areas of acute infarct in the right  posterior temporal lobe. Negative for hemorrhage. Ventricle size normal. Small chronic infarct left lateral basal ganglia. No other significant chronic ischemia. Negative for mass or edema. No midline shift. Vascular: Normal arterial flow voids in the internal carotid arteries and M1 segments. There is FLAIR hyperintensity in right M2 branch which is likely due to clot corresponding to hyperdensity on CT. Normal flow void in the vertebral arteries and basilar. Skull and upper cervical spine: Negative Sinuses/Orbits: Negative Other: None IMPRESSION: Acute infarct in the right basal ganglia. Smaller area of acute infarct right posterior temporal lobe FLAIR hyperintensity right M2 segment. Right M1 segment appears patent. These results were called by telephone at the time of interpretation on 12/04/2018 at 11:16 am to Dr. Roda Shutters , who verbally acknowledged these results. Electronically Signed   By: Marlan Palau M.D.   On: 12/04/2018 11:17   Dg Chest Port 1 View  Result Date: 12/03/2018 CLINICAL DATA:  Cough EXAM: PORTABLE CHEST 1 VIEW COMPARISON:  11/14/2018 FINDINGS: Mild cardiomegaly. No confluent opacity, effusion or edema. No acute bony abnormality. IMPRESSION: Cardiomegaly.  No acute findings. Electronically Signed   By: Charlett Nose M.D.   On: 12/03/2018 19:21   Vas US Carotid  Result Date: 12/04/2018 Carotid Arterial Duplex Study Indications:       Speech disturbance,  Left side Weakness and Stroke. Risk Factors:      Hypertension, hyperlipidemia. Limitations:       A-fib; Body habitus; immobility of the head. Comparison Study:  No prior study available. Performing Technologist: Melodie Bouillon  Examination Guidelines: A complete evaluation includes B-mode imaging, spectral Doppler, color Doppler, and power Doppler as needed of all accessible portions of each vessel. Bilateral testing is considered an integral part of a complete examination. Limited examinations for reoccurring indications may be performed as  noted.  Right Carotid Findings: +----------+--------+--------+--------+-----------------------+--------+           PSV cm/sEDV cm/sStenosisDescribe               Comments +----------+--------+--------+--------+-----------------------+--------+ CCA Prox  107                     diffuse and hyperechoic         +----------+--------+--------+--------+-----------------------+--------+ CCA Distal100                     diffuse and hyperechoic         +----------+--------+--------+--------+-----------------------+--------+ ICA Prox  78      19      1-39%   diffuse and hyperechoic         +----------+--------+--------+--------+-----------------------+--------+ ICA Distal120     29                                              +----------+--------+--------+--------+-----------------------+--------+ ECA       139                                                     +----------+--------+--------+--------+-----------------------+--------+ +----------+--------+-------+--------+-------------------+           PSV cm/sEDV cmsDescribeArm Pressure (mmHG) +----------+--------+-------+--------+-------------------+ [id number]                                         +----------+--------+-------+--------+-------------------+ +---------+--------+--+--------+----------------------------+ VertebralPSV cm/s63EDV cm/sHigh resistant and Antegrade +---------+--------+--+--------+----------------------------+  Left Carotid Findings: +----------+--------+--------+--------+-------------------------------+--------+           PSV cm/sEDV cm/sStenosisDescribe                       Comments +----------+--------+--------+--------+-------------------------------+--------+ CCA Prox  125     24              diffuse and hyperechoic                 +----------+--------+--------+--------+-------------------------------+--------+ CCA Distal107     21              diffuse and  hyperechoic                 +----------+--------+--------+--------+-------------------------------+--------+ ICA Prox  93      36      1-39%   focal, hyperechoic, calcific                                              and irregular                           +----------+--------+--------+--------+-------------------------------+--------+  ICA Mid   93      33                                                      +----------+--------+--------+--------+-------------------------------+--------+ ICA Distal115     44                                                      +----------+--------+--------+--------+-------------------------------+--------+ +----------+--------+--------+--------+-------------------+ SubclavianPSV cm/sEDV cm/sDescribeArm Pressure (mmHG) +----------+--------+--------+--------+-------------------+           237                                         +----------+--------+--------+--------+-------------------+ +---------+--------+--+--------+--+---------+ VertebralPSV cm/s80EDV cm/s19Antegrade +---------+--------+--+--------+--+---------+  Summary: Right Carotid: Velocities in the right ICA are consistent with a 1-39% stenosis. Left Carotid: Velocities in the left ICA are consistent with a 1-39% stenosis. Vertebrals: Bilateral vertebral arteries demonstrate antegrade flow. Right             vertebral artery demonstrates high resistant flow. *See table(s) above for measurements and observations.     Preliminary         Scheduled Meds: . aspirin  300 mg Rectal Daily   Or  . aspirin  325 mg Oral Daily  . enoxaparin (LOVENOX) injection  40 mg Subcutaneous Q24H  . escitalopram  10 mg Oral Daily  . iopamidol  100 mL Intravenous Once  . mirtazapine  45 mg Oral QHS  . pantoprazole  40 mg Oral BID  . QUEtiapine  50 mg Oral QHS  . sodium chloride flush  10-40 mL Intracatheter Q12H  . venlafaxine  50 mg Oral BID   Continuous Infusions: . dextrose  5 % and 0.9% NaCl 75 mL/hr at 12/04/18 1347     LOS: 0 days    Time spent: 34 minutes    Alvira Philips [place], DO [facility] Pager [phone]  If 7PM-7AM, please contact night-coverage [email or web address] Password [email or web address] 12/04/2018, 4:11 PM

## 2018-12-04 NOTE — Progress Notes (Signed)
SLP Cancellation Note  Patient Details [name] MRN: [id number] DOB: 23-Sep-1946   Cancelled treatment:       Reason Eval/Treat Not Completed: Patient at procedure or test/unavailable(off unit for imaging at this time. Will continue efforts.)  Ethelwyn Gilbertson B. Murvin [facility], CCC-SLP Speech Language Pathologist [phone]  Leigh Aurora 12/04/2018, 8:55 AM

## 2018-12-04 NOTE — Progress Notes (Signed)
CSW following for dsicharge plan. CSW noting per chart review that patient admitted from [facility], where she was receiving rehab since last admission (see last full assessment completed on 11/12/18 below). CSW attempted to meet with patient to discuss care at [facility] and plans to return, but patient did not appear fully awake during conversation; kept repeating "OK" to every question. CSW contacted patient's sister, Adrienne Stone, to discuss discharge plan. Adrienne Stone discussed how care at [facility] was "just OK", and that she'd be open to looking at other options if there are others available. If nothing is available that would be better than [place], then they are fine with returning. Adrienne Stone will not be returning to the hospital until tomorrow, so CSW will print out a list and place it at the nursing station for the sister to pick up when she returns.   CSW completed referral and will continue to follow. Patient will need a new insurance authorization prior to returning to SNF, which cannot be initiated until after PT evaluation is complete to indicate appropriate level of care.  Laveda Abbe, [place] Clinical Social Worker [phone]        Clinical Social Work Assessment  Patient Details  [name] MRN: [id number] Date of Birth: 1946/09/04  Date of referral:  11/12/18               Reason for consult:  Discharge Planning, Facility Placement                    Permission sought to share information with:  Facility Art therapist granted to share information::  Yes, Verbal Permission Granted             Name::                   Agency::  SNFs             Relationship::                Contact Information:     Housing/Transportation Living arrangements for the past 2 months:  Single Family Home Source of Information:  Patient Patient Interpreter Needed:  None Criminal Activity/Legal Involvement Pertinent to Current Situation/Hospitalization:  No -  Comment as needed Significant Relationships:  Siblings Lives with:  Self Do you feel safe going back to the place where you live?  Yes Need for family participation in patient care:  No (Coment)  Care giving concerns: Patient from home alone. PT now recommending SNF.    Social Worker assessment / plan: CSW met with patient at bedside. Patient alert and oriented. CSW introduced self and role and discussed disposition planning - PT recommendation for SNF.   Patient reported she lives at home alone. She is usually able to ambulate and complete ADLs independently. She uses a cane when she leaves the house. Her sister lives nearby.   Patient is agreeable to rehab at [facility]. CSW explained referral and insurance authorization process. Faxed out initial SNF referrals, awaiting bed offers. Will provide CMS SNF list and bed offers when available. Facility will [employer] authorization once identified. [place] auth needed before patient can admit to a facility.  CSW to follow and support with discharge planning.  Employment status:  Retired Astronomer) PT Recommendations:  [place] / Referral to community resources:  [place]  Patient/Family's Response to care: Patient appreciative of care.  Patient/Family's Understanding of and Emotional Response to Diagnosis, Current Treatment,  and Prognosis: Patient with good understanding of her conditions and care needs. She is agreeable to rehab at [facility].  Emotional Assessment Appearance:  Appears stated age Attitude/Demeanor/Rapport:  Engaged Affect (typically observed):  Accepting, Appropriate, Calm Orientation:  Oriented to Self, Oriented to Place, Oriented to  Time, Oriented to Situation Alcohol / Substance use:  Not Applicable Psych involvement (Current and /or in the community):  No (Comment)  Discharge Needs  Concerns to be addressed:  Discharge Planning  Concerns, Care Coordination Readmission within the last 30 days:  No Current discharge risk:  Physical Impairment, Lives alone Barriers to Discharge:  [place], [place] 11/12/2018, 3:44 PM

## 2018-12-04 NOTE — Evaluation (Signed)
Physical Therapy Evaluation Patient Details [name] MRN: [id number] DOB: 06/29/1946 Today's Date: 12/04/2018   History of Present Illness  Pt is a 73 y.o. female with medical history significant for depression with anxiety, hypertension, atrial fibrillation not anticoagulated due to recurrent GI bleeding, now presenting to the ED from her nursing facility for evaluation of left-sided weakness, dysarthria, and fall. 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 Smaller area of acute infarct right posterior temporal lobe. Pt with recent hospital admission and discharged to SNF on 2/13.     Clinical Impression  Pt presented supine in bed with HOB elevated, initially asleep and difficult to arouse throughout. Pt only able to tolerate rolling in bed for pericare this session secondary to elevated HR (fluctuating between low 120's to as high as 175 bpm - RN was notified). Pt's BP was also elevated to 185/110 mmHg in supine. Pt on RA throughout with SPO2 maintaining at 99-100% throughout. Pt would continue to benefit from skilled physical therapy services at this time while admitted and after d/c to address the below listed limitations in order to improve overall safety and independence with functional mobility.     Follow Up Recommendations SNF    Equipment Recommendations  None recommended by PT    Recommendations for Other Services       Precautions / Restrictions Precautions Precautions: Fall Precaution Comments: monitor HR Restrictions Weight Bearing Restrictions: No      Mobility  Bed Mobility Overal bed mobility: Needs Assistance Bed Mobility: Rolling Rolling: Mod assist;Max assist;+2 for physical assistance;+2 for safety/equipment         General bed mobility comments: modA+2 rolling towards L; maxA+2 rolling towards R; pt rolled bilaterally a couple of times for pericare  Transfers                 General transfer comment: unable this  session due to elevated HR, BP and lethargy  Ambulation/Gait                Stairs            Wheelchair Mobility    Modified Rankin (Stroke Patients Only) Modified Rankin (Stroke Patients Only) Pre-Morbid Rankin Score: Moderately severe disability Modified Rankin: Severe disability     Balance                                             Pertinent Vitals/Pain Pain Assessment: Faces Faces Pain Scale: Hurts little more Pain Location: back pain Pain Descriptors / Indicators: Discomfort;Aching Pain Intervention(s): Monitored during session    Home Living Family/patient expects to be discharged to:: Skilled nursing facility Living Arrangements: Alone Available Help at Discharge: Family             Additional Comments: pt most recently at [facility] for ST rehab, living at home prior to this    Prior Function Level of Independence: Independent with assistive device(s)   Gait / Transfers Assistance Needed: prior to most recent SNF stay, pt was using SPC in community, furniture walking in home  ADL's / Homemaking Assistance Needed: Prior to initial discharge to SNF pt was completing ADL  Comments: some info obtained from previous admission as pt with fluctuating levels of lethargy and ability to follow commands; pt was most recently in SNF but she does not recall  Hand Dominance        Extremity/Trunk Assessment   Upper Extremity Assessment Upper Extremity Assessment: Defer to OT evaluation LUE Deficits / Details: flaccid LUE, mild hypertonicity noted with elbow PROM LUE Sensation: decreased light touch LUE Coordination: decreased fine motor;decreased gross motor    Lower Extremity Assessment Lower Extremity Assessment: LLE deficits/detail LLE Deficits / Details: flaccid throughout, only minimally responding to noxious stimuli to nail bed LLE Sensation: decreased light touch LLE Coordination: decreased fine motor;decreased  gross motor       Communication   Communication: No difficulties  Cognition Arousal/Alertness: Lethargic Behavior During Therapy: Flat affect Overall Cognitive Status: Impaired/Different from baseline Area of Impairment: Orientation;Following commands;Memory;Problem solving                 Orientation Level: (disoriented to most recent living situation )   Memory: Decreased short-term memory Following Commands: Follows one step commands with increased time;Follows one step commands inconsistently     Problem Solving: Slow processing;Decreased initiation;Requires verbal cues;Requires tactile cues General Comments: pt A&Ox4 though unaware of most recently being in SNF, difficulty to fully assess as pt lethargic this session and requires cues to remain awake; max multimodal cues to follow one step commands      General Comments General comments (skin integrity, edema, etc.): Pt with elevated BP and HR this session; BP 185/110 (131) and HR fluctuating up to 120s-140s with max HR noted 175 briefly; SpO2 99-100% on RA    Exercises     Assessment/Plan    PT Assessment Patient needs continued PT services  PT Problem List Decreased strength;Decreased activity tolerance;Decreased coordination;Decreased mobility;Decreased balance;Decreased knowledge of use of DME;Decreased safety awareness;Decreased knowledge of precautions       PT Treatment Interventions DME instruction;Gait training;Functional mobility training;Therapeutic activities;Therapeutic exercise;Balance training;Neuromuscular re-education;Cognitive remediation;Patient/family education    PT Goals (Current goals can be found in the Care Plan section)  Acute Rehab PT Goals Patient Stated Goal: less pain PT Goal Formulation: Patient unable to participate in goal setting Time For Goal Achievement: 12/18/18 Potential to Achieve Goals: Fair    Frequency Min 2X/week   Barriers to discharge        Co-evaluation  PT/OT/SLP Co-Evaluation/Treatment: Yes Reason for Co-Treatment: Complexity of the patient's impairments (multi-system involvement);Necessary to address cognition/behavior during functional activity;For patient/therapist safety;To address functional/ADL transfers PT goals addressed during session: Mobility/safety with mobility;Strengthening/ROM         AM-PAC PT "6 Clicks" Mobility  Outcome Measure Help needed turning from your back to your side while in a flat bed without using bedrails?: Total Help needed moving from lying on your back to sitting on the side of a flat bed without using bedrails?: Total Help needed moving to and from a bed to a chair (including a wheelchair)?: Total Help needed standing up from a chair using your arms (e.g., wheelchair or bedside chair)?: Total Help needed to walk in hospital room?: Total Help needed climbing 3-5 steps with a railing? : Total 6 Click Score: 6    End of Session   Activity Tolerance: Patient limited by lethargy Patient left: in bed;with call bell/phone within reach;with bed alarm set Nurse Communication: Mobility status;Other (comment)(elevated HR and BP) PT Visit Diagnosis: Other abnormalities of gait and mobility (R26.89);Muscle weakness (generalized) (M62.81);Other symptoms and signs involving the nervous system (R29.898)    Time: 3354-5625 PT Time Calculation (min) (ACUTE ONLY): 25 min   Charges:   PT Evaluation $PT Eval Moderate Complexity: 1 Mod  Deborah Chalk, [place], DPT  Acute Rehabilitation Services Pager [phone] Office [phone]    Alessandra Bevels Nishtha Raider 12/04/2018, 3:59 PM

## 2018-12-04 NOTE — Progress Notes (Signed)
OT Cancellation Note  Patient Details [name] MRN: [id number] DOB: Jul 15, 1946   Cancelled Treatment:    Reason Eval/Treat Not Completed: Patient at procedure or test/ unavailable (CT); will follow up for OT eval as schedule permits.  Marcy Siren, OT Supplemental Rehabilitation Services Pager [phone] Office [phone]   Orlando Penner 12/04/2018, 8:51 AM

## 2018-12-04 NOTE — Evaluation (Signed)
Clinical/Bedside Swallow Evaluation Patient Details  [name] MRN: [id number] Date of Birth: Feb 23, 1946  Today's Date: 12/04/2018 Time: SLP Start Time (ACUTE ONLY): 1401 SLP Stop Time (ACUTE ONLY): 1420 SLP Time Calculation (min) (ACUTE ONLY): 19 min  Past Medical History:  Past Medical History:  Diagnosis Date  . Depression   . Hypercholesteremia   . Hypertension    Past Surgical History:  Past Surgical History:  Procedure Laterality Date  . ABDOMINAL HYSTERECTOMY    . BIOPSY  11/17/2018   Procedure: BIOPSY;  Surgeon: Benancio Deeds, MD;  Location[facility] ENDOSCOPY;  Service: Gastroenterology;;  . CHOLECYSTECTOMY    . ESOPHAGOGASTRODUODENOSCOPY (EGD) WITH PROPOFOL N/A 11/17/2018   Procedure: ESOPHAGOGASTRODUODENOSCOPY (EGD) WITH PROPOFOL;  Surgeon: Benancio Deeds, MD;  Location: [facility] ENDOSCOPY;  Service: Gastroenterology;  Laterality: N/A;  . GASTRIC BYPASS    . thumb surgery     HPI:  73 y.o. female with new left sided weakness, unknown time of onset. Neurology Exam reveals left facial droop, left hemiplegia, rightward eye deviation, dysarthria and left hemineglect, consistent with a large right MCA infarction. CT head reveals hypoattenuation within the right basal ganglia involving the caudate nucleus, lentiform nucleus, and posterior limb of internal capsule, compatible with acute/early subacute infarction.    Assessment / Plan / Recommendation Clinical Impression  Pt demonstrates signs of oropharyngeal dysphagia with left facial and lingual weakness, decreased labial seal and poor awareness of boluses at times. Initial ice chips tolerated relatively well, but as mulitple trials were completed and bolus size increased to multiple chips, then sips of water, nectar and bites of puree, cough frequency and intensity increased. This combined with lethargy, weak cough, medical instability and need for sedating medications for pain management all indicate high risk of  aspiration. Recommend pt remain NPO except for 2-3 ice chips occasionally with RN for comfort. Otherwise suggest Cortrak placement for consistent access to nutrition and medication as pts arousal improves. Will f/u for diagnositc treatment to determine readiness for instrumental assessment and diet initiation.  SLP Visit Diagnosis: Dysphagia, oropharyngeal phase (R13.12)    Aspiration Risk  Severe aspiration risk    Diet Recommendation NPO;Alternative means - temporary;Ice chips PRN after oral care   Medication Administration: Via alternative means    Other  Recommendations     Follow up Recommendations Inpatient Rehab      Frequency and Duration min 2x/week  2 weeks       Prognosis Prognosis for Safe Diet Advancement: Good      Swallow Study   General HPI: 73 y.o. female with new left sided weakness, unknown time of onset. Neurology Exam reveals left facial droop, left hemiplegia, rightward eye deviation, dysarthria and left hemineglect, consistent with a large right MCA infarction. CT head reveals hypoattenuation within the right basal ganglia involving the caudate nucleus, lentiform nucleus, and posterior limb of internal capsule, compatible with acute/early subacute infarction.  Type of Study: Bedside Swallow Evaluation Previous Swallow Assessment: none Diet Prior to this Study: NPO Temperature Spikes Noted: No History of Recent Intubation: No Behavior/Cognition: Lethargic/Drowsy;Requires cueing Oral Cavity Assessment: Dry Oral Care Completed by SLP: No Oral Cavity - Dentition: Adequate natural dentition Vision: Impaired for self-feeding Self-Feeding Abilities: Total assist Patient Positioning: Partially reclined Baseline Vocal Quality: Normal( a little harsh) Volitional Cough: Congested Volitional Swallow: Able to elicit    Oral/Motor/Sensory Function Overall Oral Motor/Sensory Function: Moderate impairment Facial ROM: Reduced left;Suspected CN VII (facial)  dysfunction Facial Symmetry: Abnormal symmetry left;Suspected CN VII (facial)  dysfunction Facial Strength: Reduced left;Suspected CN VII (facial) dysfunction Facial Sensation: Reduced left;Suspected CN V (Trigeminal) dysfunction Lingual ROM: Within Functional Limits Lingual Symmetry: Abnormal symmetry left;Suspected CN XII (hypoglossal) dysfunction Lingual Strength: Reduced   Ice Chips Ice chips: Impaired Presentation: Spoon Oral Phase Impairments: Reduced labial seal Pharyngeal Phase Impairments: Cough - Immediate   Thin Liquid Thin Liquid: Impaired Oral Phase Impairments: Reduced labial seal Pharyngeal  Phase Impairments: Cough - Immediate    Nectar Thick Nectar Thick Liquid: Impaired Pharyngeal Phase Impairments: Cough - Immediate   Honey Thick Honey Thick Liquid: Not tested   Puree Puree: Impaired Presentation: Spoon Pharyngeal Phase Impairments: Cough - Delayed   Solid     Solid: Not tested     Adrienne Ditty, MA CCC-SLP  Acute Rehabilitation Services Pager [phone] Office [phone]  Adrienne Stone, Adrienne Stone 12/04/2018,3:22 PM

## 2018-12-04 NOTE — Progress Notes (Signed)
On cardizem drip with max rate of 21ml/hr infusing, HR still high in the 100s-125. Pt's oriented. Denies any pain. NP paged with order of metoprolol once.

## 2018-12-04 NOTE — Progress Notes (Signed)
Carotid duplex exam completed. Please see preliminary notes on CV PROC under chart review. Earmon Sherrow H Amerika Nourse(RDMS RVT) 12/04/18 3:46 PM

## 2018-12-04 NOTE — Progress Notes (Signed)
Placed midline in upper right arm without difficulty. Received call from radiology that midline is not working properly. Assess midline when patient return to room, no blood return and swollen and distal medial area noted. Removed midline with no complications noted to line. Assessed for PIV, was able to place ultrasound PIV in right anterior forearm. Will continue to monitor,.

## 2018-12-05 ENCOUNTER — Inpatient Hospital Stay (HOSPITAL_COMMUNITY): Payer: Medicare HMO

## 2018-12-05 ENCOUNTER — Encounter (HOSPITAL_COMMUNITY): Payer: Self-pay | Admitting: Primary Care

## 2018-12-05 DIAGNOSIS — Z7189 Other specified counseling: Secondary | ICD-10-CM

## 2018-12-05 DIAGNOSIS — Z515 Encounter for palliative care: Secondary | ICD-10-CM

## 2018-12-05 DIAGNOSIS — I639 Cerebral infarction, unspecified: Secondary | ICD-10-CM

## 2018-12-05 LAB — CBC
HCT: 31.7 % — ABNORMAL LOW (ref 36.0–46.0)
Hemoglobin: 10.1 g/dL — ABNORMAL LOW (ref 12.0–15.0)
MCH: 29.5 pg (ref 26.0–34.0)
MCHC: 31.9 g/dL (ref 30.0–36.0)
MCV: 92.7 fL (ref 80.0–100.0)
Platelets: 314 10*3/uL (ref 150–400)
RBC: 3.42 MIL/uL — ABNORMAL LOW (ref 3.87–5.11)
RDW: 14.9 % (ref 11.5–15.5)
WBC: 6.5 10*3/uL (ref 4.0–10.5)
nRBC: 0 % (ref 0.0–0.2)

## 2018-12-05 LAB — BASIC METABOLIC PANEL
Anion gap: 6 (ref 5–15)
BUN: <5 mg/dL — ABNORMAL LOW (ref 8–23)
CO2: 24 mmol/L (ref 22–32)
Calcium: 8.6 mg/dL — ABNORMAL LOW (ref 8.9–10.3)
Chloride: 107 mmol/L (ref 98–111)
Creatinine, Ser: 0.64 mg/dL (ref 0.44–1.00)
GFR calc Af Amer: >60 mL/min (ref >60)
GLUCOSE: 126 mg/dL — AB (ref 70–99)
Potassium: 3.9 mmol/L (ref 3.5–5.1)
Sodium: 137 mmol/L (ref 135–145)

## 2018-12-05 MED ORDER — FAMOTIDINE 20 MG PO TABS
40.0000 mg | ORAL_TABLET | Freq: Two times a day (BID) | ORAL | Status: DC
  Administered 2018-12-06: 40 mg
  Filled 2018-12-05 (×2): qty 2

## 2018-12-05 MED ORDER — DILTIAZEM HCL 30 MG PO TABS
60.0000 mg | ORAL_TABLET | Freq: Four times a day (QID) | ORAL | Status: DC
  Administered 2018-12-06: 60 mg via NASOGASTRIC
  Filled 2018-12-05: qty 2

## 2018-12-05 MED ORDER — ASPIRIN 325 MG PO TABS
325.0000 mg | ORAL_TABLET | Freq: Every day | ORAL | Status: DC
  Administered 2018-12-06: 325 mg via NASOGASTRIC
  Filled 2018-12-05: qty 1

## 2018-12-05 MED ORDER — METOPROLOL TARTRATE 5 MG/5ML IV SOLN
5.0000 mg | Freq: Once | INTRAVENOUS | Status: AC
  Administered 2018-12-05: 5 mg via INTRAVENOUS
  Filled 2018-12-05: qty 5

## 2018-12-05 MED ORDER — MIRTAZAPINE 15 MG PO TABS
45.0000 mg | ORAL_TABLET | Freq: Every day | ORAL | Status: DC
  Filled 2018-12-05: qty 3

## 2018-12-05 MED ORDER — ASPIRIN 300 MG RE SUPP
300.0000 mg | Freq: Once | RECTAL | Status: AC
  Administered 2018-12-05: 300 mg via RECTAL

## 2018-12-05 MED ORDER — ASPIRIN 300 MG RE SUPP
300.0000 mg | Freq: Every day | RECTAL | Status: DC
  Filled 2018-12-05: qty 1

## 2018-12-05 MED ORDER — ATORVASTATIN CALCIUM 40 MG PO TABS: 40.0000 mg | ORAL_TABLET | Freq: Every day | ORAL | Status: DC

## 2018-12-05 MED ORDER — METOPROLOL TARTRATE 25 MG PO TABS
25.0000 mg | ORAL_TABLET | Freq: Two times a day (BID) | ORAL | Status: DC
  Administered 2018-12-06: 25 mg via NASOGASTRIC
  Filled 2018-12-05 (×2): qty 1

## 2018-12-05 MED ORDER — QUETIAPINE FUMARATE 50 MG PO TABS
50.0000 mg | ORAL_TABLET | Freq: Every day | ORAL | Status: DC
  Filled 2018-12-05: qty 1

## 2018-12-05 MED ORDER — ESCITALOPRAM OXALATE 10 MG PO TABS
10.0000 mg | ORAL_TABLET | Freq: Every day | ORAL | Status: DC
  Administered 2018-12-06: 10 mg
  Filled 2018-12-05: qty 1

## 2018-12-05 MED ORDER — LORAZEPAM 0.5 MG PO TABS: 0.5000 mg | ORAL_TABLET | Freq: Every evening | ORAL | Status: DC | PRN

## 2018-12-05 NOTE — Progress Notes (Signed)
STROKE TEAM PROGRESS NOTE   INTERVAL HISTORY Her RN is at the bedside.  No family present. She remains with L flaccid HP. Infarct appears embolic d/t AF. Attempted CTA/CTP early am yesterday for possible intervention, but unable to get d/t lack of appropriate IV access. She is alert and nodding to and answering questions appropriately.  Vitals:   12/05/18 0441 12/05/18 0609 12/05/18 0733 12/05/18 1125  BP:  (!) 158/76 (!) 159/78 (!) 172/77  Pulse:  (!) 105 (!) 107 (!) 105  Resp:  (!) 21 (!) 25 (!) 25  Temp: 99 F (37.2 C)  98.8 F (37.1 C) 99.1 F (37.3 C)  TempSrc: Axillary  Axillary Oral  SpO2:  95% 95% 97%  Weight:      Height:        CBC:  Recent Labs  Lab 12/03/18 1956 12/04/18 0536 12/05/18 0505  WBC 7.5 6.0 6.5  NEUTROABS 5.6  --   --   HGB 10.2* 9.8* 10.1*  HCT 32.7* 31.0* 31.7*  MCV 95.1 94.5 92.7  PLT 319 291 314    Basic Metabolic Panel:  Recent Labs  Lab 12/04/18 0536 12/05/18 0505  NA 137 137  K 4.1 3.9  CL 105 107  CO2 23 24  GLUCOSE 102* 126*  BUN 7* <5*  CREATININE 0.78 0.64  CALCIUM 8.7* 8.6*   Lipid Panel:     Component Value Date/Time   CHOL 156 12/04/2018 0536   TRIG 55 12/04/2018 0536   HDL 62 12/04/2018 0536   CHOLHDL 2.5 12/04/2018 0536   VLDL 11 12/04/2018 0536   LDLCALC 83 12/04/2018 0536   HgbA1c:  Lab Results  Component Value Date   HGBA1C 4.9 12/04/2018   Urine Drug Screen: No results found for: LABOPIA, COCAINSCRNUR, LABBENZ, AMPHETMU, THCU, LABBARB  Alcohol Level No results found for: [facility]  IMAGING Dg Thoracic Spine 2 View  Result Date: 12/04/2018 CLINICAL DATA:  Fall yesterday with upper back pain, initial encounter EXAM: THORACIC SPINE 2 VIEWS COMPARISON:  None. FINDINGS: Vertebral body height is well maintained. Mild osteophytic changes are seen. Pedicles are within normal limits and no paraspinal mass is seen. The visualize ribcage shows no abnormality. IMPRESSION: Degenerative change without acute abnormality.  Electronically Signed   By: Alcide Clever M.D.   On: 12/04/2018 10:15   Dg Lumbar Spine 2-3 Views  Result Date: 12/04/2018 CLINICAL DATA:  Recent fall with back pain, initial encounter EXAM: LUMBAR SPINE - 2-3 VIEW COMPARISON:  02/15/18 FINDINGS: Five lumbar type vertebral bodies are well visualized. Vertebral body height is well maintained with the exception of a chronic compression deformity of L1. No new focal abnormality is seen. Mild osteophytic changes are noted. IMPRESSION: Chronic L1 compression deformity.  No acute abnormality seen. Electronically Signed   By: Alcide Clever M.D.   On: 12/04/2018 10:16   Ct Head Wo Contrast  Result Date: 12/04/2018 CLINICAL DATA:  Follow up stroke. History of hypertension trauma hypercholesterolemia. EXAM: CT HEAD WITHOUT CONTRAST TECHNIQUE: Contiguous axial images were obtained from the base of the skull through the vertex without intravenous contrast. COMPARISON:  CT HEAD December 03, 2018 FINDINGS: BRAIN: RIGHT basal ganglia and internal capsule loss of gray-white matter differentiation. No intraparenchymal hemorrhage, mass effect, midline shift. No abnormal extra-axial fluid collections. VASCULAR: Mildly hyperdense RIGHT M1 segment. Mild calcific atherosclerosis carotid siphons. SKULL: No skull fracture. No significant scalp soft tissue swelling. SINUSES/ORBITS: Trace paranasal sinus mucosal thickening. Mastoid air cells are well aerated.The included ocular globes and orbital  contents are non-suspicious. OTHER: None. IMPRESSION: 1. Acute nonhemorrhagic RIGHT basal ganglia/internal capsule MCA territory infarct. 2. Suspected RIGHT M1 thromboembolism. Electronically Signed   By: Awilda Metro M.D.   On: 12/04/2018 05:43   Ct Head Wo Contrast  Result Date: 12/03/2018 CLINICAL DATA:  73 y/o F; unwitnessed fall, altered level of consciousness. EXAM: CT HEAD WITHOUT CONTRAST TECHNIQUE: Contiguous axial images were obtained from the base of the skull through the  vertex without intravenous contrast. COMPARISON:  08/19/2018 CT head. 09/24/2018 MRI head. FINDINGS: Brain: Hypoattenuation within the right basal ganglia involving the caudate nucleus, lentiform nucleus, and posterior limb of internal capsule compatible with acute/early subacute infarction. ASPECTS is 7. no additional findings of stroke, hemorrhage, extra-axial collection, hydrocephalus, or herniation. Vascular: Dense right M1, suspected thrombus (series 3, image 17). Skull: Normal. Negative for fracture or focal lesion. Sinuses/Orbits: No acute finding. Other: None. IMPRESSION: 1. Hypoattenuation within right basal ganglia involving caudate nucleus, lentiform nucleus, and posterior limb of internal capsule compatible with acute/early subacute infarction. ASPECTS is 7. No hemorrhage. 2. Dense right M1, suspected thrombus. These results were called by telephone at the time of interpretation on 12/03/2018 at 8:32 pm to Dr. Donnetta Hutching , who verbally acknowledged these results. Electronically Signed   By: Mitzi Hansen M.D.   On: 12/03/2018 20:34   Mr Maxine Glenn Head Wo Contrast  Result Date: 12/05/2018 CLINICAL DATA:  Follow-up examination for acute right MCA territory infarct. EXAM: MRA HEAD WITHOUT CONTRAST TECHNIQUE: Angiographic images of the Circle of Willis were obtained using MRA technique without intravenous contrast. COMPARISON:  Prior MRI from 12/04/2018 FINDINGS: ANTERIOR CIRCULATION: Distal cervical segments of the internal carotid arteries are patent with symmetric antegrade flow. Petrous, cavernous, and supraclinoid segments patent without hemodynamically significant stenosis. Origin of the ophthalmic arteries patent bilaterally. ICA termini well perfused. A1 segments patent bilaterally. Normal anterior communicating artery. Anterior cerebral arteries patent to their distal aspects without high-grade stenosis. Left M1 widely patent without stenosis. Normal left MCA bifurcation. Left MCA branches  well perfused to their distal aspects. Right M1 patent proximally. There is focal signal loss at the distal right M1/right MCA bifurcation (series 5, image 112), likely occluded. Signal loss extends into proximal M2 branches. Some flow related signal is seen distally within anterior right MCA branches, with little to no flow related signal seen within the mid and posterior right MCA region. POSTERIOR CIRCULATION: Left vertebral artery dominant and patent to the vertebrobasilar junction without stenosis. Right vertebral artery diffusely hypoplastic. Moderate diffuse narrowing of the proximal right V4 segment noted. Posterior inferior cerebral arteries patent bilaterally. Basilar patent to its distal aspect without stenosis. Superior cerebral arteries patent bilaterally. Left PCA supplied primarily via the basilar as well as a small left posterior communicating artery. Predominant fetal type origin of the right PCA supplied via a robust right posterior communicating artery. PCAs widely patent to their distal aspects without stenosis. No intracranial aneurysm. IMPRESSION: 1. Focal loss of flow related signal within the distal right M1 segment/right MCA bifurcation, likely at least partially occluded. Some flow preserved distally within anterior right MCA branches, with little to no flow related signal seen within the mid and posterior right MCA distribution. 2. Moderate diffuse narrowing of the hypoplastic right V4 segment. Dominant left V4 widely patent to the vertebrobasilar junction. 3. Otherwise wide patency of the intracranial circulation, with no other hemodynamically significant or correctable stenosis identified. Electronically Signed   By: Rise Mu M.D.   On: 12/05/2018 02:40   Mr  Brain Wo Contrast  Result Date: 12/04/2018 CLINICAL DATA:  Stroke.  Atrial fibrillation. EXAM: MRI HEAD WITHOUT CONTRAST TECHNIQUE: Multiplanar, multiecho pulse sequences of the brain and surrounding structures were  obtained without intravenous contrast. COMPARISON:  CT head 12/04/2018 FINDINGS: Brain: Acute infarct right basal ganglia involving the body and tail of caudate and most of the putamen. Relative sparing of the internal capsule and head of caudate. Additional small areas of acute infarct in the right posterior temporal lobe. Negative for hemorrhage. Ventricle size normal. Small chronic infarct left lateral basal ganglia. No other significant chronic ischemia. Negative for mass or edema. No midline shift. Vascular: Normal arterial flow voids in the internal carotid arteries and M1 segments. There is FLAIR hyperintensity in right M2 branch which is likely due to clot corresponding to hyperdensity on CT. Normal flow void in the vertebral arteries and basilar. Skull and upper cervical spine: Negative Sinuses/Orbits: Negative Other: None IMPRESSION: Acute infarct in the right basal ganglia. Smaller area of acute infarct right posterior temporal lobe FLAIR hyperintensity right M2 segment. Right M1 segment appears patent. These results were called by telephone at the time of interpretation on 12/04/2018 at 11:16 am to Dr. Roda Shutters , who verbally acknowledged these results. Electronically Signed   By: Marlan Palau M.D.   On: 12/04/2018 11:17   Dg Chest Port 1 View  Result Date: 12/03/2018 CLINICAL DATA:  Cough EXAM: PORTABLE CHEST 1 VIEW COMPARISON:  11/14/2018 FINDINGS: Mild cardiomegaly. No confluent opacity, effusion or edema. No acute bony abnormality. IMPRESSION: Cardiomegaly.  No acute findings. Electronically Signed   By: Charlett Nose M.D.   On: 12/03/2018 19:21   Vas US Carotid  Result Date: 12/04/2018 Carotid Arterial Duplex Study Indications:       Speech disturbance, Left side Weakness and Stroke. Risk Factors:      Hypertension, hyperlipidemia. Limitations:       A-fib; Body habitus; immobility of the head. Comparison Study:  No prior study available. Performing Technologist: Melodie Bouillon  Examination  Guidelines: A complete evaluation includes B-mode imaging, spectral Doppler, color Doppler, and power Doppler as needed of all accessible portions of each vessel. Bilateral testing is considered an integral part of a complete examination. Limited examinations for reoccurring indications may be performed as noted.  Right Carotid Findings: +----------+--------+--------+--------+-----------------------+--------+           PSV cm/sEDV cm/sStenosisDescribe               Comments +----------+--------+--------+--------+-----------------------+--------+ CCA Prox  107                     diffuse and hyperechoic         +----------+--------+--------+--------+-----------------------+--------+ CCA Distal100                     diffuse and hyperechoic         +----------+--------+--------+--------+-----------------------+--------+ ICA Prox  78      19      1-39%   diffuse and hyperechoic         +----------+--------+--------+--------+-----------------------+--------+ ICA Distal120     29                                              +----------+--------+--------+--------+-----------------------+--------+ ECA       139                                                     +----------+--------+--------+--------+-----------------------+--------+ +----------+--------+-------+--------+-------------------+  PSV cm/sEDV cmsDescribeArm Pressure (mmHG) +----------+--------+-------+--------+-------------------+ [id number]                                         +----------+--------+-------+--------+-------------------+ +---------+--------+--+--------+----------------------------+ VertebralPSV cm/s63EDV cm/sHigh resistant and Antegrade +---------+--------+--+--------+----------------------------+  Left Carotid Findings: +----------+--------+--------+--------+-------------------------------+--------+           PSV cm/sEDV cm/sStenosisDescribe                        Comments +----------+--------+--------+--------+-------------------------------+--------+ CCA Prox  125     24              diffuse and hyperechoic                 +----------+--------+--------+--------+-------------------------------+--------+ CCA Distal107     21              diffuse and hyperechoic                 +----------+--------+--------+--------+-------------------------------+--------+ ICA Prox  93      36      1-39%   focal, hyperechoic, calcific                                              and irregular                           +----------+--------+--------+--------+-------------------------------+--------+ ICA Mid   93      33                                                      +----------+--------+--------+--------+-------------------------------+--------+ ICA Distal115     44                                                      +----------+--------+--------+--------+-------------------------------+--------+ +----------+--------+--------+--------+-------------------+ SubclavianPSV cm/sEDV cm/sDescribeArm Pressure (mmHG) +----------+--------+--------+--------+-------------------+           237                                         +----------+--------+--------+--------+-------------------+ +---------+--------+--+--------+--+---------+ VertebralPSV cm/s80EDV cm/s19Antegrade +---------+--------+--+--------+--+---------+  Summary: Right Carotid: Velocities in the right ICA are consistent with a 1-39% stenosis. Left Carotid: Velocities in the left ICA are consistent with a 1-39% stenosis. Vertebrals: Bilateral vertebral arteries demonstrate antegrade flow. Right             vertebral artery demonstrates high resistant flow. *See table(s) above for measurements and observations.     Preliminary    2D Echocardiogram  11/09/2018  1. The left ventricle has normal systolic function of 60-65%. The cavity size is normal. There is moderate  concentric left ventricular wall hypertrophy. . The left ventricular diastology could not be evaluatedsecondary to atrial fibrillation.  2. Severely dilated left atrial size.  3. There is moderate mitral annular calcification present. Mitral regurgitation is trivial by color flow Doppler.  4. No atrial level shunt detected by color flow Doppler.  Neuro and Physical exam stable today  PHYSICAL EXAM HEENT: [place]/AT Cardiac:  Tachycardia  Lungs: CTA, respirations unremarkable Ext: Warm and well perfused  Neurologic Examination: Mental Status: alert today. Oriented to person, place, situation. Able to follow all right sided commands. Pained affect. Speech dysarthric but fluent with intact comprehension.  Cranial Nerves: II:  dense Left visual field cut. PERRL.  III,IV, VI: Eyes conjugately deviated to the right. Able to gaze voluntarily to the left, reaching the midline, but unable to cross midline to the left volitionally. No nystagmus.  V, VII: Left facial droop. Decreased reactivity to left sided facial stimuli.  VIII: hearing intact to voice IX,X: No hypophonia XI: Head preferentially rotated to right XII: Mild lingual dysarthria Motor: RUE 4+/5 RLE: 4/5 LUE: Flaccid with no volitional movement or withdrawal to noxious LLE: Flaccid tone with no volitional movement. Triple flexion response to noxious stimuli Plantars: Right downgoing, Left upgoing .   ASSESSMENT/PLAN Ms. Adrienne Stone is a 73 y.o. L handed  female with history of AF not on AC d/t hx GIB, recent admission for the flu (independent PTA, d/c to SNF d/t deconditioning) presenting from SNF following unwitnessed fall, found to have low O2 sats and altered mental status, L HP. CT here showed a R BG infarct.   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 ASPECTS 7. Dense R M1  CT head 2/28 0543 R BG/IC infarct. R M1 thomboembolism  Ordered  CTA head & neck, CT perfusion 2/28 0647 but could not get IV access  MRI  R BG infarcts. Small R posterior temporal lobe infarct. Flair hyperintensity R M2. R M1 patent.   MRA Occluded right M1  Carotid Doppler  pending   2D Echo  11/09/2018 EF 60-65%. No source of embolus seen. In AF.  LDL 83  HgbA1c 4.9  Lovenox 40 mg sq daily for VTE prophylaxis  No antithrombotic prior to admission, now on aspirin 300 mg suppository daily as NPO. Continue for now.   Therapy recommendations:  pending   Disposition:  pending (from [facility] PTA, there his month following hospitalization for flu. 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 Cardiology recommended both EGD and colonoscopy prior to [facility] consideration  No AC candidate at this time . Consider AC depending on oral route and GIB/anemia status  Dysphagia  Secondary to stroke  Failed stroke swallow screen Diet Order            Diet NPO time specified  Diet effective now              SLP consulted  Hypertension  Stable, on the high end . Permissive hypertension (OK if < 220/120) but gradually normalize in 5-7 days . Long-term BP goal normotensive  Hyperlipidemia  Home meds:  No statin  LDL 83, goal < 70  Add statin once able to swallow or access obtained  Other Stroke Risk Factors  Advanced age  Morbid Obesity, Body mass index is 42.1 kg/m., has had bariatric surgery in the past (2000)  Other Active Problems  Anemia, Hgb 9.8, hx GIB. EGD 11/17/2018 w/ ulcer at [facility]  anastomosis but no visible vessel   Depression and anxiety  Recent admission for the flu, d/c to SNF 11/04/2018 - 11/19/2018 - acute respiratory failure w/ hypoxia, low Na, HTN, depression/anxiety, lok K, suspected multifocal  PNA, UGIB/melena - d/c on abx, protonix  Stroke team will sign off at this time  Hospital day # 1   Personally examined patient and images, reviewed the history, evaluated lab date, reviewed imaging studies and agree with radiology interpretations.    Naomie DeanAntonia Kurstin Dimarzo, MD Stroke Neurology   A total of 15 minutes was spent for the care of this patient, spent on counseling patient and family on different diagnostic and therapeutic options, counseling and coordination of care, riskd ans benefits of management, compliance, or risk factor reduction and education.  To contact Stroke Continuity provider, please refer to [email or web address]. After hours, contact General Neurology

## 2018-12-05 NOTE — Progress Notes (Signed)
PROGRESS NOTE    Adrienne Stone  ZOX:[id number] DOB: [birth year] DOA: 12/03/2018 PCP: Adrienne Lent, PA-C   Chief complaint: Left-sided weakness, slurred speech, fall  Brief Narrative:   Adrienne Stone is a 73 y.o.  left-handed female who previously worked as a Scientist, clinical (histocompatibility and immunogenetics) with medical history significant for depression with anxiety, hypertension, atrial fibrillation not anticoagulated due to recurrent GI bleeding, now presenting to the emergency department from her nursing facility for evaluation of left-sided weakness, dysarthria, and fall. Patient is accompanied by her sister who assists with the history.  Nursing facility personnel reported that the patient fell at approximately 1:30 PM, but had told the patient's sister that she fell at approximately 5 PM.  Around the same time, she was noted to have a new speech difficulty and was not moving her left side very well.  EMS was called and she was brought into the ED for evaluation of this.  Patient's ability to provide a history is limited by her severe dysarthria.  Her sister was with her yesterday, she seemed to be in her usual state at that time, and was not complaining of anything.  ED Course: Upon arrival to the ED, patient is found to be afebrile and mildly hypertensive.  EKG features atrial fibrillation with PVC.  Chest x-ray is notable for cardiomegaly, but no acute finding.  Noncontrast head CT reveals hypoattenuation within the right basal ganglia concerning for acute/early subacute CVA, as well as a dense right M1 segment concerning for thrombus.  Chemistry panel features a potassium of 5.3 and bilirubin 1.3.  CBC is notable for a chronic normocytic anemia.  Troponin is undetectable.  Patient was given 500 cc normal saline.  Neurology was consulted by the ED physician and recommended medical admission for further evaluation and management.  Assessment & Plan:   Principal Problem:   Acute ischemic stroke [facility]) Active Problems:    Anemia   Unspecified atrial fibrillation (HCC)   History of GI bleed   Depression   Hypertension   Acute ischemic CVA Patient presenting with acute onset left-sided weakness with slurred speech following a fall at her skilled nursing facility.  History of atrial fibrillation not on chronic anticoagulation secondary to recurrent GI bleeding.  Imaging significant for an acute right basal ganglia/internal capsule/right posterior temporal lobe ischemic stroke.  Patient continues with lethargy, difficulty with speech, left-sided weakness both upper and lower extremity. 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  Speech therapy following, failed swallow evaluation, with recommendations of alternative means of nutrition. --N.p.o. --IV fluid hydration --IR for NGT placement --Nutrition consulted for tube feed recommendations  Hyperlipidemia LDL 83 with a goal less than 70.  HDL 62. --atorvastatin 40mg  daily once NGT placed  Anemia Hemoglobin 10.1 today, stable.  History of GI bleed in the past with last EGD on 11/17/2018 with ulcer at that GJ anastomosis but no visible vessel.  No signs of active bleeding. --On aspirin as above for acute stroke, unlikely candidate for chronic anticoagulation due to her recurrent  GI bleeds --Continue to monitor CBC daily  Acute hypoxic respiratory failure: resolved Recent admission for  influenza with discharge to SNF on 11/04/2018.  No history of lung disease.  --titrated off of oxygen overnight  Back pain T/L-spine x-rays notable for a chronic L1 compression fracture, otherwise no acute abnormalities. --We will continue pain control with IV morphine for now until oral access obtained.  Depression/anxiety --Will restart Lexapro, Remeron, Seroquel, Ativan, and Effexor when nutritional access obtained  Essential hypertension On clonidine 0.1 mg p.o. twice daily, Cardizem XR 360 mg p.o. daily, lisinopril 5 mg p.o. daily, metoprolol tartrate 75 mg p.o. twice daily at home. --Allowing permissive hypertension as above for acute stroke --Cardizem 60 mg every 6 hours per NGT --Metoprolol tartrate 25 mg q12h per NGT  Persistent atrial fibrillation EKG notable for persistent/permanent A. fib.  Not on chronic anticoagulation due to recurrent GI bleeds, most recently in February 2020.  Etiology of her stroke likely thrombotic event possibly from underlying A. Fib.  Overnight, heart rate increased and was persistently with A. fib with RVR.  Started on a Cardizem drip. --Continue aspirin as above --Continue Cardizem drip, currently at 15 mg/h --Start Cardizem 60 mg q6h once NGT placed --Start metoprolol tartrate 25 mg q12h once NGT placed --Metoprolol 5 mg IV q6h prn for HR >105 sustained for 5 minutes --Continue to monitor on telemetry  Ethics: Discussed with her Sister Adrienne Stone over the telephone today.  Patient with significant flaccid paralysis with decreased sensation to her left upper and lower extremity.  Unclear if she will ever regain any functional status to this side.  She also has significant oral pharyngeal dysphasia from her stroke.  Currently awaiting placement of NG tube.  Discussed with her sister that she may need more permanent nutritional access such as AG versus PEG tube.  She will discuss this with her sister this afternoon; as apparently there has been previous  conversations that she would not want such aggressive measures if it were to be more permanent.  Discussed with Adrienne Stone that given strokes unable to ascertain if she will recover and only time will tell.  May need palliative care involvement based on further discussions today.   DVT prophylaxis: Lovenox Code Status: DNR Family Communication: Discussed extensively with Sister Adrienne Stone over telephone today.  She will have conversation with her sister regarding PEG/G-tube placement.  We will hold off on palliative care consult until further discussion with her sister this afternoon. Disposition Plan: Continue inpatient hospitalization, will need alternative means of nutrition with planned NG tube placement, may need PEG tube following further discussion with patient/family versus palliative care.   Consultants:   Neurology  Interventional radiology  Procedures:   Transthoracic echocardiogram 11/09/2018:  1. The left ventricle has normal systolic function of 60-65%. The cavity size is normal. There is moderate concentric left ventricular wall hypertrophy. . The left ventricular diastology could not be evaluatedsecondary to atrial fibrillation.  2. Severely dilated left atrial size.  3. There is moderate mitral annular calcification present. Mitral regurgitation is trivial by color flow Doppler.  4. No atrial level shunt detected by color flow Doppler.  Ultrasound carotid 12/04/2018: Right Carotid: Velocities in the right ICA are consistent with a 1-39% stenosis. Left Carotid: Velocities in the left ICA are consistent with a 1-39% stenosis. Vertebrals: Bilateral vertebral arteries demonstrate antegrade flow. Right vertebral artery demonstrates high resistant flow.  Antimicrobials: none   Subjective: Patient seen and examined at bedside.  No family present.  Night heart  rate became uncontrolled and persistently in A. fib with RVR.  Was started on a Cardizem drip with suboptimal control of heart  rate.  Still waiting for NG tube placement, nursing unable to place at bedside unfortunately.  Will request interventional radiology for placement today.  Continues with flaccid paralysis of her left upper and lower extremity; no significant change in neurologic status from yesterday.  Discussed with her Sister Adrienne Stone over the telephone today.  She is to discuss with her regarding her wishes moving forward, i.e. feeding tube versus more palliative approach. No other complaints at this time.  Denies headache, no visual changes, no nausea/vomiting/diarrhea, no fever/chills/night sweats, no chest pain, no palpitations, no shortness of breath, no abdominal pain, no issues with bowel/bladder function.  No acute events overnight per nursing staff.  Objective: Vitals:   12/05/18 0355 12/05/18 0441 12/05/18 0609 12/05/18 0733  BP: (!) 168/79  (!) 158/76 (!) 159/78  Pulse: (!) 111  (!) 105 (!) 107  Resp: (!) 24  (!) 21 (!) 25  Temp:  99 F (37.2 C)  98.8 F (37.1 C)  TempSrc:  Axillary  Axillary  SpO2: 94%  95% 95%  Weight:      Height:        Intake/Output Summary (Last 24 hours) at 12/05/2018 1131 Last data filed at 12/05/2018 0500 Gross per 24 hour  Intake 225 ml  Output 1325 ml  Net -1100 ml   Filed Weights   12/03/18 1808 12/03/18 2300  Weight: 107.5 kg 107.8 kg    Examination:  GEN: 73 yo CF in NAD, alert, obese HEENT: NCAT, PERRL, EOMI, sclera clear, dry mucous membranes PULM: CTAB w/o wheezes/crackles, normal respiratory effort CV: IRR, tachycardic, no murmur/gallops/rubs, no peripheral edema, no JVD GI: abd soft, NTND, NABS, no R/G/M MSK: no peripheral edema, flaccid paralysis of left upper/lower extremity, right upper/lower extremity strength 5/5 PSYCH: Subdued mood/affect Integumentary: dry/intact, no rashes or wounds  Neuro Exam Mental Status: Alert, + dysarthria, + aphasia, attention/comprehension seems appropriate, unable to fully evaluate fund of knowledge.  Cranial  Nerves: visual fields full, PERRL, EOMi, intact smooth pursuit, no nystagmus Motor: LUE 0/5,   LLE 0/5,   RUE 4+/5,   RLE 4+/5   normal muscle bulk no significant atrophy, normal tone, no spasticity or rigidity apperciated  Sensory: Decreased/lack of sensation to left upper/lower extremity  Coordination/Movement: no tremor noted     Data Reviewed: I have personally reviewed following labs and imaging studies  CBC: Recent Labs  Lab 12/03/18 1956 12/04/18 0536 12/05/18 0505  WBC 7.5 6.0 6.5  NEUTROABS 5.6  --   --   HGB 10.2* 9.8* 10.1*  HCT 32.7* 31.0* 31.7*  MCV 95.1 94.5 92.7  PLT 319 291 314   Basic Metabolic Panel: Recent Labs  Lab 12/03/18 1956 12/04/18 0536 12/05/18 0505  NA 136 137 137  K 5.3* 4.1 3.9  CL 104 105 107  CO2 GLUCOSE 94 102* 126*  BUN 10 7* <5*  CREATININE 0.94 0.78 0.64  CALCIUM 9.1 8.7* 8.6*   GFR: Estimated Creatinine Clearance: 72.7 mL/min (by C-G formula based on SCr of 0.64 mg/dL). Liver Function Tests: Recent Labs  Lab 12/03/18 1956  AST 58*  ALT 14  ALKPHOS 40  BILITOT 1.3*  PROT 6.2*  ALBUMIN 3.3*   No results for input(s): LIPASE, AMYLASE in the last 168 hours. No results for input(s): AMMONIA in the last 168 hours. Coagulation Profile: No results for input(s): INR,  PROTIME in the last 168 hours. Cardiac Enzymes: Recent Labs  Lab 12/03/18 1956  TROPONINI <0.03   BNP (last 3 results) No results for input(s): PROBNP in the last 8760 hours. HbA1C: Recent Labs    12/04/18 0536  HGBA1C 4.9   CBG: No results for input(s): GLUCAP in the last 168 hours. Lipid Profile: Recent Labs    12/04/18 0536  CHOL 156  HDL 62  LDLCALC 83  TRIG 55  CHOLHDL 2.5   Thyroid Function Tests: No results for input(s): TSH, T4TOTAL, FREET4, T3FREE, THYROIDAB in the last 72 hours. Anemia Panel: No results for input(s): VITAMINB12, FOLATE, FERRITIN, TIBC, IRON, RETICCTPCT in the last 72 hours. Sepsis Labs: No results for  input(s): PROCALCITON, LATICACIDVEN in the last 168 hours.  No results found for this or any previous visit (from the past 240 hour(s)).       Radiology Studies: Dg Thoracic Spine 2 View  Result Date: 12/04/2018 CLINICAL DATA:  Fall yesterday with upper back pain, initial encounter EXAM: THORACIC SPINE 2 VIEWS COMPARISON:  None. FINDINGS: Vertebral body height is well maintained. Mild osteophytic changes are seen. Pedicles are within normal limits and no paraspinal mass is seen. The visualize ribcage shows no abnormality. IMPRESSION: Degenerative change without acute abnormality. Electronically Signed   By: Alcide CleverMark  Lukens M.D.   On: 12/04/2018 10:15   Dg Lumbar Spine 2-3 Views  Result Date: 12/04/2018 CLINICAL DATA:  Recent fall with back pain, initial encounter EXAM: LUMBAR SPINE - 2-3 VIEW COMPARISON:  02/15/18 FINDINGS: Five lumbar type vertebral bodies are well visualized. Vertebral body height is well maintained with the exception of a chronic compression deformity of L1. No new focal abnormality is seen. Mild osteophytic changes are noted. IMPRESSION: Chronic L1 compression deformity.  No acute abnormality seen. Electronically Signed   By: Alcide CleverMark  Lukens M.D.   On: 12/04/2018 10:16   Ct Head Wo Contrast  Result Date: 12/04/2018 CLINICAL DATA:  Follow up stroke. History of hypertension trauma hypercholesterolemia. EXAM: CT HEAD WITHOUT CONTRAST TECHNIQUE: Contiguous axial images were obtained from the base of the skull through the vertex without intravenous contrast. COMPARISON:  CT HEAD December 03, 2018 FINDINGS: BRAIN: RIGHT basal ganglia and internal capsule loss of gray-white matter differentiation. No intraparenchymal hemorrhage, mass effect, midline shift. No abnormal extra-axial fluid collections. VASCULAR: Mildly hyperdense RIGHT M1 segment. Mild calcific atherosclerosis carotid siphons. SKULL: No skull fracture. No significant scalp soft tissue swelling. SINUSES/ORBITS: Trace paranasal  sinus mucosal thickening. Mastoid air cells are well aerated.The included ocular globes and orbital contents are non-suspicious. OTHER: None. IMPRESSION: 1. Acute nonhemorrhagic RIGHT basal ganglia/internal capsule MCA territory infarct. 2. Suspected RIGHT M1 thromboembolism. Electronically Signed   By: Awilda Metroourtnay  Bloomer M.D.   On: 12/04/2018 05:43   Ct Head Wo Contrast  Result Date: 12/03/2018 CLINICAL DATA:  73 y/o F; unwitnessed fall, altered level of consciousness. EXAM: CT HEAD WITHOUT CONTRAST TECHNIQUE: Contiguous axial images were obtained from the base of the skull through the vertex without intravenous contrast. COMPARISON:  08/19/2018 CT head. 09/24/2018 MRI head. FINDINGS: Brain: Hypoattenuation within the right basal ganglia involving the caudate nucleus, lentiform nucleus, and posterior limb of internal capsule compatible with acute/early subacute infarction. ASPECTS is 7. no additional findings of stroke, hemorrhage, extra-axial collection, hydrocephalus, or herniation. Vascular: Dense right M1, suspected thrombus (series 3, image 17). Skull: Normal. Negative for fracture or focal lesion. Sinuses/Orbits: No acute finding. Other: None. IMPRESSION: 1. Hypoattenuation within right basal ganglia involving caudate nucleus, lentiform nucleus, and  posterior limb of internal capsule compatible with acute/early subacute infarction. ASPECTS is 7. No hemorrhage. 2. Dense right M1, suspected thrombus. These results were called by telephone at the time of interpretation on 12/03/2018 at 8:32 pm to Dr. Donnetta Hutching , who verbally acknowledged these results. Electronically Signed   By: Mitzi Hansen M.D.   On: 12/03/2018 20:34   Mr Maxine Glenn Head Wo Contrast  Result Date: 12/05/2018 CLINICAL DATA:  Follow-up examination for acute right MCA territory infarct. EXAM: MRA HEAD WITHOUT CONTRAST TECHNIQUE: Angiographic images of the Circle of Willis were obtained using MRA technique without intravenous contrast.  COMPARISON:  Prior MRI from 12/04/2018 FINDINGS: ANTERIOR CIRCULATION: Distal cervical segments of the internal carotid arteries are patent with symmetric antegrade flow. Petrous, cavernous, and supraclinoid segments patent without hemodynamically significant stenosis. Origin of the ophthalmic arteries patent bilaterally. ICA termini well perfused. A1 segments patent bilaterally. Normal anterior communicating artery. Anterior cerebral arteries patent to their distal aspects without high-grade stenosis. Left M1 widely patent without stenosis. Normal left MCA bifurcation. Left MCA branches well perfused to their distal aspects. Right M1 patent proximally. There is focal signal loss at the distal right M1/right MCA bifurcation (series 5, image 112), likely occluded. Signal loss extends into proximal M2 branches. Some flow related signal is seen distally within anterior right MCA branches, with little to no flow related signal seen within the mid and posterior right MCA region. POSTERIOR CIRCULATION: Left vertebral artery dominant and patent to the vertebrobasilar junction without stenosis. Right vertebral artery diffusely hypoplastic. Moderate diffuse narrowing of the proximal right V4 segment noted. Posterior inferior cerebral arteries patent bilaterally. Basilar patent to its distal aspect without stenosis. Superior cerebral arteries patent bilaterally. Left PCA supplied primarily via the basilar as well as a small left posterior communicating artery. Predominant fetal type origin of the right PCA supplied via a robust right posterior communicating artery. PCAs widely patent to their distal aspects without stenosis. No intracranial aneurysm. IMPRESSION: 1. Focal loss of flow related signal within the distal right M1 segment/right MCA bifurcation, likely at least partially occluded. Some flow preserved distally within anterior right MCA branches, with little to no flow related signal seen within the mid and posterior  right MCA distribution. 2. Moderate diffuse narrowing of the hypoplastic right V4 segment. Dominant left V4 widely patent to the vertebrobasilar junction. 3. Otherwise wide patency of the intracranial circulation, with no other hemodynamically significant or correctable stenosis identified. Electronically Signed   By: Rise Mu M.D.   On: 12/05/2018 02:40   Mr Brain Wo Contrast  Result Date: 12/04/2018 CLINICAL DATA:  Stroke.  Atrial fibrillation. EXAM: MRI HEAD WITHOUT CONTRAST TECHNIQUE: Multiplanar, multiecho pulse sequences of the brain and surrounding structures were obtained without intravenous contrast. COMPARISON:  CT head 12/04/2018 FINDINGS: Brain: Acute infarct right basal ganglia involving the body and tail of caudate and most of the putamen. Relative sparing of the internal capsule and head of caudate. Additional small areas of acute infarct in the right posterior temporal lobe. Negative for hemorrhage. Ventricle size normal. Small chronic infarct left lateral basal ganglia. No other significant chronic ischemia. Negative for mass or edema. No midline shift. Vascular: Normal arterial flow voids in the internal carotid arteries and M1 segments. There is FLAIR hyperintensity in right M2 branch which is likely due to clot corresponding to hyperdensity on CT. Normal flow void in the vertebral arteries and basilar. Skull and upper cervical spine: Negative Sinuses/Orbits: Negative Other: None IMPRESSION: Acute infarct in the right  basal ganglia. Smaller area of acute infarct right posterior temporal lobe FLAIR hyperintensity right M2 segment. Right M1 segment appears patent. These results were called by telephone at the time of interpretation on 12/04/2018 at 11:16 am to Dr. Roda Shutters , who verbally acknowledged these results. Electronically Signed   By: Marlan Palau M.D.   On: 12/04/2018 11:17   Dg Chest Port 1 View  Result Date: 12/03/2018 CLINICAL DATA:  Cough EXAM: PORTABLE CHEST 1 VIEW  COMPARISON:  11/14/2018 FINDINGS: Mild cardiomegaly. No confluent opacity, effusion or edema. No acute bony abnormality. IMPRESSION: Cardiomegaly.  No acute findings. Electronically Signed   By: Charlett Nose M.D.   On: 12/03/2018 19:21   Vas US Carotid  Result Date: 12/04/2018 Carotid Arterial Duplex Study Indications:       Speech disturbance, Left side Weakness and Stroke. Risk Factors:      Hypertension, hyperlipidemia. Limitations:       A-fib; Body habitus; immobility of the head. Comparison Study:  No prior study available. Performing Technologist: Melodie Bouillon  Examination Guidelines: A complete evaluation includes B-mode imaging, spectral Doppler, color Doppler, and power Doppler as needed of all accessible portions of each vessel. Bilateral testing is considered an integral part of a complete examination. Limited examinations for reoccurring indications may be performed as noted.  Right Carotid Findings: +----------+--------+--------+--------+-----------------------+--------+           PSV cm/sEDV cm/sStenosisDescribe               Comments +----------+--------+--------+--------+-----------------------+--------+ CCA Prox  107                     diffuse and hyperechoic         +----------+--------+--------+--------+-----------------------+--------+ CCA Distal100                     diffuse and hyperechoic         +----------+--------+--------+--------+-----------------------+--------+ ICA Prox  78      19      1-39%   diffuse and hyperechoic         +----------+--------+--------+--------+-----------------------+--------+ ICA Distal120     29                                              +----------+--------+--------+--------+-----------------------+--------+ ECA       139                                                     +----------+--------+--------+--------+-----------------------+--------+ +----------+--------+-------+--------+-------------------+            PSV cm/sEDV cmsDescribeArm Pressure (mmHG) +----------+--------+-------+--------+-------------------+ [id number]                                         +----------+--------+-------+--------+-------------------+ +---------+--------+--+--------+----------------------------+ VertebralPSV cm/s63EDV cm/sHigh resistant and Antegrade +---------+--------+--+--------+----------------------------+  Left Carotid Findings: +----------+--------+--------+--------+-------------------------------+--------+           PSV cm/sEDV cm/sStenosisDescribe                       Comments +----------+--------+--------+--------+-------------------------------+--------+ CCA Prox  125     24  diffuse and hyperechoic                 +----------+--------+--------+--------+-------------------------------+--------+ CCA Distal107     21              diffuse and hyperechoic                 +----------+--------+--------+--------+-------------------------------+--------+ ICA Prox  93      36      1-39%   focal, hyperechoic, calcific                                              and irregular                           +----------+--------+--------+--------+-------------------------------+--------+ ICA Mid   93      33                                                      +----------+--------+--------+--------+-------------------------------+--------+ ICA Distal115     44                                                      +----------+--------+--------+--------+-------------------------------+--------+ +----------+--------+--------+--------+-------------------+ SubclavianPSV cm/sEDV cm/sDescribeArm Pressure (mmHG) +----------+--------+--------+--------+-------------------+           237                                         +----------+--------+--------+--------+-------------------+ +---------+--------+--+--------+--+---------+ VertebralPSV cm/s80EDV  cm/s19Antegrade +---------+--------+--+--------+--+---------+  Summary: Right Carotid: Velocities in the right ICA are consistent with a 1-39% stenosis. Left Carotid: Velocities in the left ICA are consistent with a 1-39% stenosis. Vertebrals: Bilateral vertebral arteries demonstrate antegrade flow. Right             vertebral artery demonstrates high resistant flow. *See table(s) above for measurements and observations.     Preliminary         Scheduled Meds: . [START ON 12/06/2018] aspirin  325 mg Per NG tube Daily   Or  . [START ON 12/06/2018] aspirin  300 mg Rectal Daily  . atorvastatin  40 mg Per NG tube q1800  . diltiazem  60 mg Per NG tube Q6H  . enoxaparin (LOVENOX) injection  40 mg Subcutaneous Q24H  . [START ON 12/06/2018] escitalopram  10 mg Per Tube Daily  . famotidine  40 mg Per Tube BID  . iopamidol  100 mL Intravenous Once  . metoprolol tartrate  25 mg Per NG tube BID  . mirtazapine  45 mg Per NG tube QHS  . QUEtiapine  50 mg Per NG tube QHS  . sodium chloride flush  10-40 mL Intracatheter Q12H  . venlafaxine  50 mg Oral BID   Continuous Infusions: . dextrose 5 % and 0.9% NaCl 75 mL/hr at 12/05/18 0752  . diltiazem (CARDIZEM) infusion 15 mg/hr (12/05/18 0745)     LOS: 1 day    Time spent: 32 minutes    Alvira Philips  [place], DO [facility] Pager [phone]  If 7PM-7AM, please contact night-coverage [email or web address] Password [facility] 12/05/2018, 11:31 AM

## 2018-12-05 NOTE — Progress Notes (Signed)
CSW printed and put the CMS SNF list on the patients shadow chart. CSW also left a copy for the patients sister at the nurses station.   CSW called and spoke with the patients sister and informed her that the list is at the nurses station. CSW explained to the patients sister about SNF. She stated that the patient was at [facility] for almost two weeks. CSW explained that the patient will likely be in her copay days very soon. She stated that her sister cannot afford to pay out of pocket for short term rehab.   CSW advised the patients sister to check with the patients insurance company to see what specifically she would have to pay. She will review the facility list and determine if they would like to proceed with SNF.   CSW will continue to follow.   Drucilla Schmidt, MSW, LCSW-A Clinical Social Worker [employer]

## 2018-12-05 NOTE — Progress Notes (Signed)
Initial Nutrition Assessment  DOCUMENTATION CODES:   Morbid obesity  INTERVENTION:    Once access obtained, initiate Tube Feeding:  Osmolite 1.2 at 60 ml/hr Pro-Stat 30 mL daily Initiate TF at rate of 20 ml/hr; titrate by 5 mL q 4 hours until goal rate of 60 ml/hr Provides 95 g of protein, 1828 kcals, 1166 mL of free water   NUTRITION DIAGNOSIS:   Inadequate oral intake related to acute illness, dysphagia as evidenced by NPO status.   GOAL:   Patient will meet greater than or equal to 90% of their needs  MONITOR:   TF tolerance, Labs, Weight trends  REASON FOR ASSESSMENT:   Consult Enteral/tube feeding initiation and management  ASSESSMENT:    73 yo female admitted with acute ischemic CVA with left-sided weakness, dysarthria and dysphagia. PMH includes depression/anxiety, HTN  NPO since 2/27 Dietitian Consult received for Tube Feeding initiation Cortrak service unavailable today due to machine malfunctioning. Per RN, multiple attempts made at NG tube placement but kept coiling in back of throat. MD placed order for NG tube to be placed by radiology via fluoro but unable to be done today. At this time, plan for Cortrak tube on Monday  Pt alert but with speech difficulty Sister at bedside.Reports pt with some weight loss since January due to hospitalization for pneumonia. Pt was discharged to [facility] and was doing well there. Working with PT and eating very well with plans to discharge home next week until this acute even.   Current wt 107.8 kg. Noted weight trending down per weight encounters. Pt weighed 115 kg on 11/18/18, 118 kg on 01/2018  Labs: reviewed Meds: D5-NS at 75 ml/hr, remeron  NUTRITION - FOCUSED PHYSICAL EXAM:    Most Recent Value  Orbital Region  No depletion  Upper Arm Region  No depletion  Thoracic and Lumbar Region  No depletion  Buccal Region  No depletion  Temple Region  No depletion  Clavicle Bone Region  No depletion  Clavicle  and Acromion Bone Region  No depletion  Scapular Bone Region  No depletion  Dorsal Hand  No depletion  Patellar Region  No depletion  Anterior Thigh Region  No depletion  Posterior Calf Region  No depletion  Edema (RD Assessment)  Mild       Diet Order:   Diet Order            Diet NPO time specified  Diet effective now              EDUCATION NEEDS:   Not appropriate for education at this time  Skin:  Skin Assessment: Reviewed RN Assessment  Last BM:  2/26  Height:   Ht Readings from Last 1 Encounters:  12/03/18 5\' 3"  (1.6 m)    Weight:   Wt Readings from Last 1 Encounters:  12/03/18 107.8 kg    Ideal Body Weight:  52.3 kg  BMI:  Body mass index is 42.1 kg/m.  Estimated Nutritional Needs:   Kcal:  1800-2000 kcals   Protein:  90-100 g   Fluid:  >/= 1.8 L   Romelle Starcher MS, RD, LDN, CNSC [phone] Pager  [phone] Weekend/On-Call Pager

## 2018-12-05 NOTE — Progress Notes (Signed)
Acknowledge consult for LTACH. Patient does not have qualifying stay for [facility]. Patient is admitted from SNF. CSW is following with anticipation of return to [place] at DC.

## 2018-12-05 NOTE — Progress Notes (Signed)
Administered metoprolol PRN. HR still  fluctuates in the 100s-120. NP paged with another order of metoprolol once.

## 2018-12-05 NOTE — Consult Note (Signed)
Consultation Note Date: 12/05/2018   Patient [name]  DOB: [birth year]  MRN: [id number]  Age / Sex: 73 y.o., female  PCP: Windell Hummingbird, PA-C Referring Physician: [place] Eric J, DO  Reason for Consultation: Establishing goals of care and Psychosocial/spiritual support  HPI/Patient Profile: 73 y.o. female  with past medical history of depression and anxiety, high blood pressure and cholesterol, A. 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   PMT consulted for [place], 22 F admit from SNF with acute CVA with resultant dysphasia/left-sided hemiparesis. Pt apparently did not want to have PEG. Please assist with medical decision making. Likely hospice.  Clinical Assessment and Goals of Care: I have reviewed medical records including EPIC notes, labs and imaging, received report from bedside nursing staff, assessed the patient and then met at the bedside along with sister/HC POA, Adrienne Stone to discuss diagnosis prognosis, GOC, EOL wishes, disposition and options.  I introduced Palliative Medicine as specialized medical care for people living with serious illness. It focuses on providing relief from the symptoms and stress of a serious illness. The goal is to improve quality of life for both the patient and the family.  We discussed a brief life review of the patient.  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  She had been living independently prior to her hospitalization for flu A at the end of January this year.  She has been at [facility] participating in rehab.   As far as functional and nutritional status, mobility limited by obesity.  She was independent with IADLs.  We discussed her current illness and what it means  in the larger context of her on-going co-morbidities.  Natural disease trajectory and expectations at EOL were discussed.  We talked about stroke trajectory, time needed to determine her ability to recover.  I share that usually people recover as much as they will around 33-month.  We talked about flaccid left side and left neglect, Adrienne Stone side.  We talked about the risks of bedbound status including skin breakdown, UTIs and pneumonia.  We also talk about swallowing and nutrition.  We talked about silent aspiration and aspirating saliva.  Speech therapy arrives for evaluation.  I attempted to elicit values and goals of care important to the patient.  I asked Adrienne Stone to consider if she would be satisfied living the rest of her life in a nursing home.  I share my concern about the ability for her to care for herself if she does not regain functional status.  Sister MLeda Gauzeand I talked about how to make choices for loved ones including 1) keeping her at the center of decision-making, 2) are we doing something for her or to her (can we change what is happening) 3) hearing from Adrienne Stone what is important to her.  At this point Adrienne Stone  and her sister understand that things can change suddenly, but they are interested in rehab if possible.  They share that they are working with Education officer, museum for disposition.  The difference between aggressive medical intervention and comfort care was considered in light of the patient's goals of care.  We talked about the concepts of allowing natural death (DNR) and the concept of let nature take its course, do not treat the next infection.  Advanced directives, concepts specific to code status, artifical feeding and hydration, were considered and discussed.  At this point Adrienne Stone is agreeable to temporary feeding tube, but she and sister agree no permanent PEG tube.  Questions and concerns were addressed. The family was encouraged to  call with questions or concerns.  I share that PMT will follow-up Monday  HCPOA    NEXT OF KIN - sister, Adrienne Stone.    SUMMARY OF RECOMMENDATIONS   Patient and sister are agreeable to trial of core track tube for temporary nutrition.  No PEG tube. Agreeable for rehab if qualified. Allow natural death (DNR) verified.  Discussed the concept of let nature take its course.  Code Status/Advance Care Planning:  DNR  Symptom Management:   Per hospitalist, no additional needs at this time.  Palliative Prophylaxis:   Aspiration and Turn Reposition  Additional Recommendations (Limitations, Scope, Preferences):  No PEG tube, continue to treat the treatable, try for rehab  Psycho-social/Spiritual:   Desire for further Chaplaincy support:no  Additional Recommendations: Caregiving  Support/Resources and Education on Hospice  Prognosis:   Unable to determine, based on outcomes.  Discharge Planning: Patient and sister are interested in rehab, at this time, if possible     Primary Diagnoses: Present on Admission: . Acute ischemic stroke ([place]) . Unspecified atrial fibrillation ([place]) . Anemia . Depression . Hypertension   I have reviewed the medical record, interviewed the patient and family, and examined the patient. The following aspects are pertinent.  Past Medical History:  Diagnosis Date  . Depression   . Hypercholesteremia   . Hypertension    Social History   Socioeconomic History  . Marital status: Divorced    Spouse name: Not on file  . Number of children: Not on file  . Years of education: Not on file  . Highest education level: Not on file  Occupational History  . Not on file  Social Needs  . Financial resource strain: Not on file  . Food insecurity:    Worry: Not on file    Inability: Not on file  . Transportation needs:    Medical: Not on file    Non-medical: Not on file  Tobacco Use  . Smoking status: Former Research scientist (life sciences)  . Smokeless tobacco:  Never Used  Substance and Sexual Activity  . Alcohol use: Yes    Comment: daily  . Drug use: No  . Sexual activity: Not on file  Lifestyle  . Physical activity:    Days per week: Not on file    Minutes per session: Not on file  . Stress: Not on file  Relationships  . Social connections:    Talks on phone: Not on file    Gets together: Not on file    Attends religious service: Not on file    Active member of club or organization: Not on file    Attends meetings of clubs or organizations: Not on file    Relationship status: Not on file  Other Topics Concern  . Not on file  Social  History Narrative  . Not on file   History reviewed. No pertinent family history. Scheduled Meds: . [START ON 12/06/2018] aspirin  325 mg Per NG tube Daily   Or  . [START ON 12/06/2018] aspirin  300 mg Rectal Daily  . atorvastatin  40 mg Per NG tube q1800  . diltiazem  60 mg Per NG tube Q6H  . enoxaparin (LOVENOX) injection  40 mg Subcutaneous Q24H  . [START ON 12/06/2018] escitalopram  10 mg Per Tube Daily  . famotidine  40 mg Per Tube BID  . iopamidol  100 mL Intravenous Once  . metoprolol tartrate  25 mg Per NG tube BID  . mirtazapine  45 mg Per NG tube QHS  . QUEtiapine  50 mg Per NG tube QHS  . sodium chloride flush  10-40 mL Intracatheter Q12H  . venlafaxine  50 mg Oral BID   Continuous Infusions: . dextrose 5 % and 0.9% NaCl 75 mL/hr at 12/05/18 0752  . diltiazem (CARDIZEM) infusion 15 mg/hr (12/05/18 0745)   PRN Meds:.acetaminophen **OR** acetaminophen (TYLENOL) oral liquid 160 mg/5 mL **OR** acetaminophen, guaiFENesin, LORazepam, metoprolol tartrate, morphine injection, senna-docusate, sodium chloride flush Medications Prior to Admission:  Prior to Admission medications   Medication Sig Start Date End Date Taking? Authorizing Provider  acetaminophen (TYLENOL) 650 MG CR tablet Take 1,300 mg by mouth every 8 (eight) hours as needed for pain.   Yes [provider]  Calcium  Carbonate-Vit D-Min (CALCIUM 1200 PO) Take 1,200 mg by mouth daily.    Yes [provider]  cloNIDine (CATAPRES) 0.1 MG tablet Take 0.1 mg by mouth 2 (two) times daily. 01/27/18  Yes [provider]  diltiazem (CARDIZEM CD) 360 MG 24 hr capsule Take 1 capsule (360 mg total) by mouth daily. 11/20/18  Yes Shelly Coss, MD  escitalopram (LEXAPRO) 10 MG tablet Take 10 mg by mouth daily.   Yes [provider]  fenofibrate 160 MG tablet Take 160 mg by mouth daily. 07/02/16  Yes [provider]  ferrous sulfate 325 (65 FE) MG tablet Take 325 mg by mouth daily with breakfast.   Yes [provider]  guaiFENesin (MUCINEX) 600 MG 12 hr tablet Take 600 mg by mouth 2 (two) times daily.   Yes [provider]  lisinopril (PRINIVIL,ZESTRIL) 5 MG tablet Take 1 tablet (5 mg total) by mouth daily. 11/20/18  Yes Shelly Coss, MD  loratadine (CLARITIN) 10 MG tablet Take 10 mg by mouth daily.   Yes [provider]  LORazepam (ATIVAN) 0.5 MG tablet Take 0.5 mg by mouth at bedtime as needed for anxiety. 11/25/18 12/07/18 Yes [provider]  Magnesium 250 MG TABS Take 250 mg by mouth daily.    Yes [provider]  metoprolol tartrate (LOPRESSOR) 25 MG tablet Take 75 mg by mouth 2 (two) times daily.   Yes [provider]  mirtazapine (REMERON) 45 MG tablet Take 45 mg by mouth at bedtime.   Yes [provider]  Multiple Vitamin (MULTIVITAMIN) capsule Take 1 capsule by mouth daily.   Yes [provider]  omeprazole (PRILOSEC) 40 MG capsule crack open and ingest contents to maximize absorption of PPI given your gastric bypass state Patient taking differently: Take 40 mg by mouth See admin instructions. crack open and ingest contents to maximize absorption of PPI given your gastric bypass state 11/19/18  Yes Adhikari, Tamsen Meek, MD  polyethylene glycol powder (GLYCOLAX/MIRALAX) powder Take 17 g by mouth daily as needed for mild  constipation.   Yes [provider]  QUEtiapine (SEROQUEL) 50 MG tablet Take 50 mg by mouth at bedtime.  11/02/18  Yes [provider]  venlafaxine (EFFEXOR) 50 MG tablet Take 50 mg by mouth 2 (two) times daily.   Yes [provider]  vitamin C (ASCORBIC ACID) 500 MG tablet Take 1,000 mg by mouth daily.    Yes [provider]  lidocaine (LIDODERM) 5 % Place 1 patch onto the skin daily. Remove & Discard patch within 12 hours or as directed by MD 12/03/18   Nat Christen, MD   Allergies  Allergen Reactions  . Demerol [Meperidine] Rash  . Penicillins Rash    Did it involve swelling of the face/tongue/throat, SOB, or low BP? Unknown Did it involve sudden or severe rash/hives, skin peeling, or any reaction on the inside of your mouth or nose? Unknown Did you need to seek medical attention at a hospital or doctor's office? Unknown When did it last happen?15 years ago If all above answers are "NO", may proceed with cephalosporin use.   . Prozac [Fluoxetine Hcl] Palpitations  . Serzone [Nefazodone] Rash  . Sulfa Antibiotics Rash   Review of Systems  Unable to perform ROS: Acuity of condition    Physical Exam Vitals signs and nursing note reviewed.  Constitutional:      Comments: Neglecting left side, right gaze, appears morbidly obese acutely/chronically ill.  HENT:     Head: Normocephalic and atraumatic.  Cardiovascular:     Comments: Heart rate in the low 100s Pulmonary:     Effort: Pulmonary effort is normal. No respiratory distress.  Abdominal:     Comments: Obese abdomen  Skin:    General: Skin is warm and dry.  Neurological:     Mental Status: She is alert.     Comments: Oriented to person and place, known acute stroke, left-sided neglect  Psychiatric:     Comments: Calm and cooperative, not fearful     Vital Signs: BP (!) 172/77 (BP Location: Left Arm)   Pulse (!) 105   Temp 99.1 F (37.3 C) (Oral)   Resp (!) 25   Ht _0  (1.6 m)    Wt 107.8 kg   SpO2 97%   BMI 42.10 kg/m  Pain Scale: 0-10   Pain Score: 6    SpO2: SpO2: 97 % O2 Device:SpO2: 97 % O2 Flow Rate: .O2 Flow Rate (L/min): 4 L/min  IO: Intake/output summary:   Intake/Output Summary (Last 24 hours) at 12/05/2018 1331 Last data filed at 12/05/2018 0500 Gross per 24 hour  Intake 225 ml  Output 1325 ml  Net -1100 ml    LBM: Last BM Date: 12/02/18 Baseline Weight: Weight: 107.5 kg Most recent weight: Weight: 107.8 kg     Palliative Assessment/Data:   Flowsheet Rows     Most Recent Value  Intake Tab  Referral Department  Hospitalist  Unit at Time of Referral  Cardiac/Telemetry Unit  Palliative Care Primary Diagnosis  Neurology  Date Notified  12/05/18  Palliative Care Type  New Palliative care  Reason for referral  Clarify Goals of Care  Date of Admission  12/04/18  Date first seen by Palliative Care  12/05/18  # of days Palliative referral response time  0 Day(s)  # of days IP prior to Palliative referral  1  Clinical Assessment  Palliative Performance Scale Score  30%  Pain Max last 24 hours  Not able to report  Pain Min Last 24 hours  Not able to report  Dyspnea Max Last 24 Hours  Not able to report  Dyspnea Min Last 24 hours  Not able to report  Psychosocial & Spiritual Assessment  Palliative Care Outcomes      Time In: 1320 Time Out: 1430 Time Total: 70  minutes Greater than 50%  of this time was spent counseling and coordinating care related to the above assessment and plan.  Signed by: Drue Novel, NP   Please contact Palliative Medicine Team phone at [phone] for questions and concerns.  For individual provider: See Shea Evans

## 2018-12-05 NOTE — Progress Notes (Signed)
  Speech Language Pathology Treatment: Dysphagia  Patient Details [name] MRN: [id number] DOB: 07/23/1946 Today's Date: 12/05/2018 Time: 3785-8850 SLP Time Calculation (min) (ACUTE ONLY): 13 min  Assessment / Plan / Recommendation Clinical Impression  Pt seen for follow-up for dysphagia. Cortrak not in place as machine broken, per dietitian. Pt/sister meeting with palliative care upon arrival. SLP performed oral care, removing yellow secretions from palate and small amount of dried blood from lingual surface. Suction canister present but not set up as suction port already in use. Pt alert, continues with R gaze preference. She tolerated small ice chips relatively well, with no overt signs of aspiration initially, though larger ice chips elicited immediate coughing. SLP discussed MBS (objective testing) with pt and sister; will reassess next date and consider MBS if improvements at bedside, otherwise may require NG for temporary means of nutrition. Continue NPO with a few small ice chips after oral care for comfort; oral suction recommended to maximize oral care.    HPI HPI: 73 y.o. female with new left sided weakness, unknown time of onset. Neurology Exam reveals left facial droop, left hemiplegia, rightward eye deviation, dysarthria and left hemineglect, consistent with a large right MCA infarction. CT head reveals hypoattenuation within the right basal ganglia involving the caudate nucleus, lentiform nucleus, and posterior limb of internal capsule, compatible with acute/early subacute infarction. MRI shows R BG, small right posterior temporal infarcts.      SLP Plan  Continue with current plan of care       Recommendations  Diet recommendations: NPO(a few small ice chips after oral care with RN for comfort) Liquids provided via: Teaspoon Medication Administration: Via alternative means Supervision: Full supervision/cueing for compensatory strategies Compensations: Slow  rate;Small sips/bites                Oral Care Recommendations: Oral care QID Follow up Recommendations: Inpatient Rehab SLP Visit Diagnosis: Dysphagia, oropharyngeal phase (R13.12) Plan: Continue with current plan of care       GO              Rondel Baton, MS, CCC-SLP Speech-Language Pathologist Acute Rehabilitation Services Pager: [phone] Office: [phone]  Arlana Lindau 12/05/2018, 3:43 PM

## 2018-12-05 NOTE — Progress Notes (Signed)
Paged cortrak team this AM for placement, they stated that their equipment was broken today, and they would be able to place cortrak on Monday when it is fixed. Orders obtained for NG tube instead of cortrak. Myself and charge RN Marylene Land made multiple attempts to place NG tube.  Patient had difficulty swallowing to assist with tube placement. Dr. [place] was notified. Will continue to monitor.

## 2018-12-05 NOTE — Evaluation (Signed)
Speech Language Pathology Evaluation Patient Details [name] MRN: [id number] DOB: 1945/10/19 Today's Date: 12/05/2018 Time: 7616-0737 SLP Time Calculation (min) (ACUTE ONLY): 24 min  Problem List:  Patient Active Problem List   Diagnosis Date Noted  . Acute ischemic stroke (HCC) 12/03/2018  . History of GI bleed 12/03/2018  . Depression 12/03/2018  . Hypertension 12/03/2018  . Gastric ulcer with hemorrhage   . Melena   . Unspecified atrial fibrillation (HCC) 11/10/2018  . Hypertensive urgency 11/05/2018  . Anemia 11/05/2018   Past Medical History:  Past Medical History:  Diagnosis Date  . Depression   . Hypercholesteremia   . Hypertension    Past Surgical History:  Past Surgical History:  Procedure Laterality Date  . ABDOMINAL HYSTERECTOMY    . BIOPSY  11/17/2018   Procedure: BIOPSY;  Surgeon: Benancio Deeds, MD;  Location: [facility] ENDOSCOPY;  Service: Gastroenterology;;  . CHOLECYSTECTOMY    . ESOPHAGOGASTRODUODENOSCOPY (EGD) WITH PROPOFOL N/A 11/17/2018   Procedure: ESOPHAGOGASTRODUODENOSCOPY (EGD) WITH PROPOFOL;  Surgeon: Benancio Deeds, MD;  Location: [facility] ENDOSCOPY;  Service: Gastroenterology;  Laterality: N/A;  . GASTRIC BYPASS    . thumb surgery     HPI:  73 y.o. female with new left sided weakness, unknown time of onset. Neurology Exam reveals left facial droop, left hemiplegia, rightward eye deviation, dysarthria and left hemineglect, consistent with a large right MCA infarction. CT head reveals hypoattenuation within the right basal ganglia involving the caudate nucleus, lentiform nucleus, and posterior limb of internal capsule, compatible with acute/early subacute infarction. MRI shows R BG, small right posterior temporal infarcts.   Assessment / Plan / Recommendation Clinical Impression   Patient presents with moderate cognitive deficits and moderate dysarthria (lingual and facial weakness). Pt able to sustain alertness and attention  throughout cognitive-linguistic assessment. Slow processing noted, with pt frequently repeating stimuli (both during testing and conversation) apparently to assist with processing. Delayed response time noted even with simple questions, increasing when cognitive load/complexity added. Recall is a relative strength; pt oriented to date, time, situation, and place, and she was able to recall 4/5 words with a delay. Left neglect/field cut impacts performance in visuospatial and perception tasks. When family friends entered the room, pt did respond to verbal, visual cues for L head turn to name visitors. Pt would benefit from skilled ST to maximize cognition, safety awareness, and intelligibility. Will follow acutely.    SLP Assessment  SLP Recommendation/Assessment: Patient needs continued Speech Lanaguage Pathology Services SLP Visit Diagnosis: Dysarthria and anarthria (R47.1);Cognitive communication deficit (R41.841)    Follow Up Recommendations  Inpatient Rehab    Frequency and Duration min 2x/week  2 weeks      SLP Evaluation Cognition  Overall Cognitive Status: Impaired/Different from baseline Arousal/Alertness: Awake/alert Orientation Level: Oriented X4 Attention: Sustained;Selective Sustained Attention: Appears intact Selective Attention: Impaired Selective Attention Impairment: Verbal basic;Functional basic Memory: Impaired Memory Impairment: Decreased short term memory(immediate recall 5/5, delayed 4/5) Decreased Short Term Memory: Verbal complex Awareness: Impaired Awareness Impairment: Emergent impairment;Anticipatory impairment Problem Solving: Impaired Problem Solving Impairment: Verbal basic;Functional basic(slow processing) Executive Function: Reasoning Reasoning: Impaired Reasoning Impairment: Verbal complex(abstract reasoning 1/3) Safety/Judgment: Impaired Comments: decreased       Comprehension  Auditory Comprehension Overall Auditory Comprehension: (slow  processing; pt frequently repeat stimuli for processing) Yes/No Questions: Within Functional Limits(extended time) Commands: Within Functional Limits(extended time, some repetition required) Conversation: Simple Interfering Components: Attention;Processing speed EffectiveTechniques: Extra processing time;Repetition;Stressing words;Visual/Gestural cues Visual Recognition/Discrimination Discrimination: Within Function Limits Reading Comprehension Reading Status:  Not tested    Expression Expression Primary Mode of Expression: Verbal Verbal Expression Overall Verbal Expression: Impaired Automatic Speech: Name;Social Response Level of Generative/Spontaneous Verbalization: Sentence Repetition: No impairment Naming: Impairment Confrontation: Within functional limits Divergent: 25-49% accurate(3 fruits in 60 seconds; d/t slow processing, attention ) Pragmatics: Impairment Impairments: Abnormal affect;Eye contact;Monotone Interfering Components: Attention Non-Verbal Means of Communication: Not applicable Written Expression Dominant Hand: Left Written Expression: Not tested   Oral / Motor  Oral Motor/Sensory Function Overall Oral Motor/Sensory Function: Moderate impairment Facial ROM: Reduced left;Suspected CN VII (facial) dysfunction Facial Symmetry: Abnormal symmetry left;Suspected CN VII (facial) dysfunction Facial Strength: Reduced left;Suspected CN VII (facial) dysfunction Facial Sensation: Reduced left;Suspected CN V (Trigeminal) dysfunction Lingual ROM: Within Functional Limits Lingual Symmetry: Abnormal symmetry left;Suspected CN XII (hypoglossal) dysfunction Lingual Strength: Reduced Motor Speech Overall Motor Speech: Impaired Respiration: Within functional limits Phonation: Low vocal intensity Resonance: Within functional limits Articulation: Impaired Level of Impairment: Sentence Intelligibility: Intelligibility reduced Word: 75-100% accurate Phrase: 75-100%  accurate Sentence: 75-100% accurate Conversation: 75-100% accurate Motor Planning: Witnin functional limits   GO                   Rondel Baton, MS, CCC-SLP Speech-Language Pathologist Acute Rehabilitation Services Pager: [phone] Office: [phone]  Arlana Lindau 12/05/2018, 4:02 PM

## 2018-12-06 ENCOUNTER — Inpatient Hospital Stay (HOSPITAL_COMMUNITY): Payer: Medicare HMO

## 2018-12-06 LAB — BASIC METABOLIC PANEL
Anion gap: 8 (ref 5–15)
BUN: 7 mg/dL — ABNORMAL LOW (ref 8–23)
CO2: 22 mmol/L (ref 22–32)
CREATININE: 0.73 mg/dL (ref 0.44–1.00)
Calcium: 8.6 mg/dL — ABNORMAL LOW (ref 8.9–10.3)
Chloride: 107 mmol/L (ref 98–111)
GFR calc Af Amer: >60 mL/min (ref >60)
GFR calc non Af Amer: >60 mL/min (ref >60)
Glucose, Bld: 133 mg/dL — ABNORMAL HIGH (ref 70–99)
Potassium: 3.8 mmol/L (ref 3.5–5.1)
Sodium: 137 mmol/L (ref 135–145)

## 2018-12-06 LAB — PHOSPHORUS: Phosphorus: 2.9 mg/dL (ref 2.5–4.6)

## 2018-12-06 LAB — CBC
HCT: 32.5 % — ABNORMAL LOW (ref 36.0–46.0)
Hemoglobin: 10 g/dL — ABNORMAL LOW (ref 12.0–15.0)
MCH: 28.8 pg (ref 26.0–34.0)
MCHC: 30.8 g/dL (ref 30.0–36.0)
MCV: 93.7 fL (ref 80.0–100.0)
Platelets: 299 10*3/uL (ref 150–400)
RBC: 3.47 MIL/uL — ABNORMAL LOW (ref 3.87–5.11)
RDW: 14.6 % (ref 11.5–15.5)
WBC: 5.8 10*3/uL (ref 4.0–10.5)
nRBC: 0 % (ref 0.0–0.2)

## 2018-12-06 LAB — MAGNESIUM: Magnesium: 1.6 mg/dL — ABNORMAL LOW (ref 1.7–2.4)

## 2018-12-06 MED ORDER — ASPIRIN 325 MG PO TABS
325.0000 mg | ORAL_TABLET | Freq: Every day | ORAL | Status: DC
  Administered 2018-12-07 – 2018-12-08 (×2): 325 mg via ORAL
  Filled 2018-12-06 (×3): qty 1

## 2018-12-06 MED ORDER — DILTIAZEM HCL 30 MG PO TABS
60.0000 mg | ORAL_TABLET | Freq: Four times a day (QID) | ORAL | Status: DC
  Administered 2018-12-06 – 2018-12-08 (×7): 60 mg via ORAL
  Filled 2018-12-06 (×8): qty 2

## 2018-12-06 MED ORDER — ATORVASTATIN CALCIUM 40 MG PO TABS
40.0000 mg | ORAL_TABLET | Freq: Every day | ORAL | Status: DC
  Administered 2018-12-06 – 2018-12-07 (×2): 40 mg via ORAL
  Filled 2018-12-06 (×2): qty 1

## 2018-12-06 MED ORDER — HYDROCODONE-ACETAMINOPHEN 5-325 MG PO TABS
1.0000 | ORAL_TABLET | Freq: Four times a day (QID) | ORAL | Status: DC | PRN
  Administered 2018-12-07: 1 via ORAL
  Filled 2018-12-06: qty 1

## 2018-12-06 MED ORDER — MIRTAZAPINE 15 MG PO TABS
45.0000 mg | ORAL_TABLET | Freq: Every day | ORAL | Status: DC
  Administered 2018-12-06 – 2018-12-07 (×2): 45 mg via ORAL
  Filled 2018-12-06 (×2): qty 3

## 2018-12-06 MED ORDER — METOPROLOL TARTRATE 25 MG PO TABS
25.0000 mg | ORAL_TABLET | Freq: Two times a day (BID) | ORAL | Status: DC
  Administered 2018-12-06 – 2018-12-08 (×4): 25 mg via ORAL
  Filled 2018-12-06 (×4): qty 1

## 2018-12-06 MED ORDER — ESCITALOPRAM OXALATE 10 MG PO TABS
10.0000 mg | ORAL_TABLET | Freq: Every day | ORAL | Status: DC
  Administered 2018-12-07 – 2018-12-08 (×2): 10 mg via ORAL
  Filled 2018-12-06 (×2): qty 1

## 2018-12-06 MED ORDER — ASPIRIN 300 MG RE SUPP: 300.0000 mg | Freq: Every day | RECTAL | Status: DC

## 2018-12-06 MED ORDER — QUETIAPINE FUMARATE 50 MG PO TABS
50.0000 mg | ORAL_TABLET | Freq: Every day | ORAL | Status: DC
  Administered 2018-12-06 – 2018-12-07 (×2): 50 mg via ORAL
  Filled 2018-12-06 (×2): qty 1

## 2018-12-06 MED ORDER — FAMOTIDINE 20 MG PO TABS
40.0000 mg | ORAL_TABLET | Freq: Two times a day (BID) | ORAL | Status: DC
  Administered 2018-12-06 – 2018-12-08 (×4): 40 mg via ORAL
  Filled 2018-12-06 (×4): qty 2

## 2018-12-06 MED ORDER — LORAZEPAM 0.5 MG PO TABS: 0.5000 mg | ORAL_TABLET | Freq: Every evening | ORAL | Status: DC | PRN

## 2018-12-06 NOTE — Progress Notes (Signed)
PROGRESS NOTE    Adrienne Stone  UJW:[id number] DOB: Mar 29, 1946 DOA: 12/03/2018 PCP: Darryl Lentaylor, Amanda, PA-C   Chief complaint: Left-sided weakness, slurred speech, fall  Brief Narrative:   Adrienne Stone is a 73 y.o.  left-handed female who previously worked as a Scientist, clinical (histocompatibility and immunogenetics)CRNA with medical history significant for depression with anxiety, hypertension, atrial fibrillation not anticoagulated due to recurrent GI bleeding, now presenting to the emergency department from her nursing facility for evaluation of left-sided weakness, dysarthria, and fall. Patient is accompanied by her sister who assists with the history.  Nursing facility personnel reported that the patient fell at approximately 1:30 PM, but had told the patient's sister that she fell at approximately 5 PM.  Around the same time, she was noted to have a new speech difficulty and was not moving her left side very well.  EMS was called and she was brought into the ED for evaluation of this.  Patient's ability to provide a history is limited by her severe dysarthria.  Her sister was with her yesterday, she seemed to be in her usual state at that time, and was not complaining of anything.  ED Course: Upon arrival to the ED, patient is found to be afebrile and mildly hypertensive.  EKG features atrial fibrillation with PVC.  Chest x-ray is notable for cardiomegaly, but no acute finding.  Noncontrast head CT reveals hypoattenuation within the right basal ganglia concerning for acute/early subacute CVA, as well as a dense right M1 segment concerning for thrombus.  Chemistry panel features a potassium of 5.3 and bilirubin 1.3.  CBC is notable for a chronic normocytic anemia.  Troponin is undetectable.  Patient was given 500 cc normal saline.  Neurology was consulted by the ED physician and recommended medical admission for further evaluation and management.  Assessment & Plan:   Principal Problem:   Acute ischemic stroke [facility](HCC) Active Problems:    Anemia   Unspecified atrial fibrillation (HCC)   History of GI bleed   Depression   Hypertension   Cerebrovascular accident (CVA) (HCC)   Goals of care, counseling/discussion   Palliative care by specialist   Acute ischemic CVA Patient presenting with acute onset left-sided weakness with slurred speech following a fall at her skilled nursing facility.  History of atrial fibrillation not on chronic anticoagulation secondary to recurrent GI bleeding.  Imaging significant for an acute right basal ganglia/internal capsule/right posterior temporal lobe ischemic stroke.  Patient continues with lethargy, difficulty with speech, left-sided weakness both upper and lower extremity. 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  Speech therapy following, failed swallow evaluation, with recommendations of alternative means of nutrition. --MBS 3/1 and per speech therapy passed for puree diet --continue IV fluid hydration for another 24hrs --hold on core track placement for now --continue speech therapy  Hyperlipidemia LDL 83 with a goal less than 70.  HDL 62. --atorvastatin 40mg  daily   Anemia Hemoglobin 10.0 today, stable.  History of GI bleed in the past with last EGD on 11/17/2018 with ulcer at  that GJ anastomosis but no visible vessel.  No signs of active bleeding. --On aspirin as above for acute stroke, unlikely candidate  for chronic anticoagulation due to her recurrent GI bleeds --Continue to monitor CBC daily  Acute hypoxic respiratory failure: resolved Recent admission for influenza with discharge to SNF on 11/04/2018.  No history of lung disease.  --titrated off of oxygen   Back pain T/L-spine x-rays notable for a chronic L1 compression fracture, otherwise no acute abnormalities. --pain control with norco and IV morphine   Depression/anxiety --restarted Lexapro, Remeron, Seroquel, Ativan, and Effexor  Essential hypertension On clonidine 0.1 mg p.o. twice daily, Cardizem XR 360 mg p.o. daily, lisinopril 5 mg p.o. daily, metoprolol tartrate 75 mg p.o. twice daily at home. --Allowing permissive hypertension as above for acute stroke --Cardizem 60 mg every 6 hours  --Metoprolol tartrate 25 mg q12h   Persistent atrial fibrillation EKG notable for persistent/permanent A. fib.  Not on chronic anticoagulation due to recurrent GI bleeds, most recently in February 2020.  Etiology of her stroke likely thrombotic event possibly from underlying A. Fib.  Overnight, heart rate increased and was persistently with A. fib with RVR.  Started on a Cardizem drip. --Continue aspirin as above --Continue Cardizem drip, currently at 15 mg/h --Start Cardizem 60 mg q6h  --Start metoprolol tartrate 25 mg q12h  --Metoprolol 5 mg IV q6h prn for HR >105 sustained for 5 minutes --hopefull to titrate off cardizem gtt today now that can tolerate oral intake --Continue to monitor on telemetry  Ethics: Discussed with her Sister [name] at bedside.  Patient with significant flaccid paralysis with decreased sensation to her left upper and lower extremity.  Unclear if she will ever regain any functional status to this side.  She also has significant oral pharyngeal dysphasia from her stroke.  Patient would not want PEG tube placed.  Palliative care following.    DVT prophylaxis: Lovenox Code Status: DNR Family Communication: Discussed  extensively with Sister [name] at bedide today. Disposition Plan: Continue inpatient hospitalization, MBS passed today to allow puree. Hopefull to titrate off cardizem gtt today. Pending SNF placement.    Consultants:   Neurology  Palliative care  Procedures:   Transthoracic echocardiogram 11/09/2018:  1. The left ventricle has normal systolic function of 60-65%. The cavity size is normal. There is moderate concentric left ventricular wall hypertrophy. . The left ventricular diastology could not be evaluatedsecondary to atrial fibrillation.  2. Severely dilated left atrial size.  3. There is moderate mitral annular calcification present. Mitral regurgitation is trivial by color flow Doppler.  4. No atrial level shunt detected by color flow Doppler.  Ultrasound carotid 12/04/2018: Right Carotid: Velocities in the right ICA are consistent with a 1-39% stenosis. Left Carotid: Velocities in the left ICA are consistent with a 1-39% stenosis. Vertebrals: Bilateral vertebral arteries demonstrate antegrade flow. Right vertebral artery demonstrates high resistant flow.  MBS 12/06/2018: results pending  Antimicrobials: none   Subjective: Patient seen and examined at bedside, sister [name] present.  Just returned from modified barium swallow with reports that she passed with a pured diet.  Continues on a Cardizem drip, heart rate now controlled.  Now that can tolerate oral/crushed meds, will restart much of her home regimen.  Continues with flaccid paralysis of her left upper/lower extremity.  No significant change in her neurological status except maybe her swallowing has improved slightly.  Patient sister and patient have confirmed that they would not like a PEG tube placed.  Palliative care following.  Hopeful for SNF placement. No other complaints at this time.  Denies headache, no visual changes, no nausea/vomiting/diarrhea, no fever/chills/night sweats, no chest pain, no palpitations, no  shortness of breath, no abdominal pain, no issues with bowel/bladder function.  No acute events overnight per nursing staff.  Objective: Vitals:   12/05/18 2343 12/06/18 0420 12/06/18 0859 12/06/18 1157  BP: (!) 142/78 (!) 158/84 (!) 142/80 (!) 152/71  Pulse: 95 99 85 95  Resp: 17 (!) 21 (!) 22 (!) 25  Temp: 98.9 F (37.2 C) 98.6 F (37 C) 98.8 F (37.1 C) 98.8 F (37.1 C)  TempSrc: Oral Oral Oral Axillary  SpO2: 96% 96% 98% 99%  Weight:      Height:        Intake/Output Summary (Last 24 hours) at 12/06/2018 1210 Last data filed at 12/06/2018 0500 Gross per 24 hour  Intake 1719.32 ml  Output 1025 ml  Net 694.32 ml   Filed Weights   12/03/18 1808 12/03/18 2300  Weight: 107.5 kg 107.8 kg    Examination:  GEN: 73 yo CF in NAD, alert, obese HEENT: NCAT, PERRL, EOMI, sclera clear, dry mucous membranes PULM: CTAB w/o wheezes/crackles, normal respiratory effort CV: IRR, tachycardic, no murmur/gallops/rubs, no peripheral edema, no JVD GI: abd soft, NTND, NABS, no R/G/M MSK: no peripheral edema, flaccid paralysis of left upper/lower extremity, right upper/lower extremity strength 5/5 PSYCH: Subdued mood/affect Integumentary: dry/intact, no rashes or wounds  Neuro Exam Mental Status: Alert, + dysarthria, + aphasia, attention/comprehension seems appropriate, unable to fully evaluate fund of knowledge.  Cranial Nerves: visual fields full, PERRL, EOMi, intact smooth pursuit, no nystagmus Motor: LUE 0/5,   LLE 0/5,   RUE 4+/5,   RLE 4+/5   normal muscle bulk no significant atrophy, normal tone, no spasticity or rigidity apperciated  Sensory: Decreased/lack of sensation to left upper/lower extremity  Coordination/Movement: no tremor noted     Data Reviewed: I have personally reviewed following labs and imaging studies  CBC: Recent Labs  Lab 12/03/18 1956 12/04/18 0536 12/05/18 0505 12/06/18 0440  WBC 7.5 6.0 6.5 5.8  NEUTROABS 5.6  --   --   --   HGB 10.2* 9.8* 10.1*  10.0*  HCT 32.7* 31.0* 31.7* 32.5*  MCV 95.1 94.5 92.7 93.7  PLT 319 291 314 299   Basic Metabolic Panel: Recent Labs  Lab 12/03/18 1956 12/04/18 0536 12/05/18 0505 12/06/18 0440  NA 136 137 137 137  K 5.3* 4.1 3.9 3.8  CL 104 105 107 107  CO2 GLUCOSE 94 102* 126* 133*  BUN 10 7* <5* 7*  CREATININE 0.94 0.78 0.64 0.73  CALCIUM 9.1 8.7* 8.6* 8.6*  MG  --   --   --  1.6*  PHOS  --   --   --  2.9   GFR: Estimated Creatinine Clearance: 72.7 mL/min (by C-G formula based on SCr of 0.73 mg/dL). Liver Function Tests: Recent Labs  Lab 12/03/18 1956  AST 58*  ALT 14  ALKPHOS 40  BILITOT 1.3*  PROT 6.2*  ALBUMIN 3.3*   No results for input(s): LIPASE, AMYLASE in the last 168 hours. No results for input(s): AMMONIA in the last 168 hours. Coagulation Profile: No results for input(s): INR, PROTIME in the last 168 hours. Cardiac Enzymes: Recent Labs  Lab 12/03/18 1956  TROPONINI <0.03   BNP (last 3 results) No results for input(s): PROBNP in the last 8760 hours. HbA1C: Recent Labs    12/04/18 0536  HGBA1C 4.9   CBG: No results for input(s): GLUCAP in the last 168 hours. Lipid Profile: Recent Labs    12/04/18 0536  CHOL 156  HDL 62  LDLCALC 83  TRIG 55  CHOLHDL 2.5   Thyroid Function Tests: No results for input(s): TSH, T4TOTAL, FREET4, T3FREE, THYROIDAB in the last 72 hours. Anemia Panel: No results for input(s): VITAMINB12, FOLATE, FERRITIN, TIBC, IRON, RETICCTPCT in the last 72 hours. Sepsis Labs: No results for input(s): PROCALCITON, LATICACIDVEN in the last 168 hours.  No results found for this or any previous visit (from the past 240 hour(s)).       Radiology Studies: Mr Shirlee Latch JS Contrast  Result Date: 12/05/2018 CLINICAL DATA:  Follow-up examination for acute right MCA territory infarct. EXAM: MRA HEAD WITHOUT CONTRAST TECHNIQUE: Angiographic images of the Circle of Willis were obtained using MRA technique without intravenous  contrast. COMPARISON:  Prior MRI from 12/04/2018 FINDINGS: ANTERIOR CIRCULATION: Distal cervical segments of the internal carotid arteries are patent with symmetric antegrade flow. Petrous, cavernous, and supraclinoid segments patent without hemodynamically significant stenosis. Origin of the ophthalmic arteries patent bilaterally. ICA termini well perfused. A1 segments patent bilaterally. Normal anterior communicating artery. Anterior cerebral arteries patent to their distal aspects without high-grade stenosis. Left M1 widely patent without stenosis. Normal left MCA bifurcation. Left MCA branches well perfused to their distal aspects. Right M1 patent proximally. There is focal signal loss at the distal right M1/right MCA bifurcation (series 5, image 112), likely occluded. Signal loss extends into proximal M2 branches. Some flow related signal is seen distally within anterior right MCA branches, with little to no flow related signal seen within the mid and posterior right MCA region. POSTERIOR CIRCULATION: Left vertebral artery dominant and patent to the vertebrobasilar junction without stenosis. Right vertebral artery diffusely hypoplastic. Moderate diffuse narrowing of the proximal right V4 segment noted. Posterior inferior cerebral arteries patent bilaterally. Basilar patent to its distal aspect without stenosis. Superior cerebral arteries patent bilaterally. Left PCA supplied primarily via the basilar as well as a small left posterior communicating artery. Predominant fetal type origin of the right PCA supplied via a robust right posterior communicating artery. PCAs widely patent to their distal aspects without stenosis. No intracranial aneurysm. IMPRESSION: 1. Focal loss of flow related signal within the distal right M1 segment/right MCA bifurcation, likely at least partially occluded. Some flow preserved distally within anterior right MCA branches, with little to no flow related signal seen within the mid and  posterior right MCA distribution. 2. Moderate diffuse narrowing of the hypoplastic right V4 segment. Dominant left V4 widely patent to the vertebrobasilar junction. 3. Otherwise wide patency of the intracranial circulation, with no other hemodynamically significant or correctable stenosis identified. Electronically Signed   By: Rise Mu M.D.   On: 12/05/2018 02:40   Vas US Carotid  Result Date: 12/04/2018 Carotid Arterial Duplex Study Indications:       Speech disturbance, Left side Weakness and Stroke. Risk Factors:      Hypertension, hyperlipidemia. Limitations:       A-fib; Body habitus; immobility of the head. Comparison Study:  No prior study available. Performing Technologist: Melodie Bouillon  Examination Guidelines: A complete evaluation includes B-mode imaging, spectral Doppler, color Doppler, and power Doppler as needed of all accessible portions of each vessel. Bilateral testing is considered an integral part of a complete examination. Limited examinations for reoccurring indications may be performed as noted.  Right Carotid Findings: +----------+--------+--------+--------+-----------------------+--------+  PSV cm/sEDV cm/sStenosisDescribe               Comments +----------+--------+--------+--------+-----------------------+--------+ CCA Prox  107                     diffuse and hyperechoic         +----------+--------+--------+--------+-----------------------+--------+ CCA Distal100                     diffuse and hyperechoic         +----------+--------+--------+--------+-----------------------+--------+ ICA Prox  78      19      1-39%   diffuse and hyperechoic         +----------+--------+--------+--------+-----------------------+--------+ ICA Distal120     29                                              +----------+--------+--------+--------+-----------------------+--------+ ECA       139                                                      +----------+--------+--------+--------+-----------------------+--------+ +----------+--------+-------+--------+-------------------+           PSV cm/sEDV cmsDescribeArm Pressure (mmHG) +----------+--------+-------+--------+-------------------+ [id number]                                         +----------+--------+-------+--------+-------------------+ +---------+--------+--+--------+----------------------------+ VertebralPSV cm/s63EDV cm/sHigh resistant and Antegrade +---------+--------+--+--------+----------------------------+  Left Carotid Findings: +----------+--------+--------+--------+-------------------------------+--------+           PSV cm/sEDV cm/sStenosisDescribe                       Comments +----------+--------+--------+--------+-------------------------------+--------+ CCA Prox  125     24              diffuse and hyperechoic                 +----------+--------+--------+--------+-------------------------------+--------+ CCA Distal107     21              diffuse and hyperechoic                 +----------+--------+--------+--------+-------------------------------+--------+ ICA Prox  93      36      1-39%   focal, hyperechoic, calcific                                              and irregular                           +----------+--------+--------+--------+-------------------------------+--------+ ICA Mid   93      33                                                      +----------+--------+--------+--------+-------------------------------+--------+ ICA Distal115     44                                                      +----------+--------+--------+--------+-------------------------------+--------+ +----------+--------+--------+--------+-------------------+  SubclavianPSV cm/sEDV cm/sDescribeArm Pressure (mmHG) +----------+--------+--------+--------+-------------------+           237                                          +----------+--------+--------+--------+-------------------+ +---------+--------+--+--------+--+---------+ VertebralPSV cm/s80EDV cm/s19Antegrade +---------+--------+--+--------+--+---------+  Summary: Right Carotid: Velocities in the right ICA are consistent with a 1-39% stenosis. Left Carotid: Velocities in the left ICA are consistent with a 1-39% stenosis. Vertebrals: Bilateral vertebral arteries demonstrate antegrade flow. Right             vertebral artery demonstrates high resistant flow. *See table(s) above for measurements and observations.     Preliminary         Scheduled Meds: . [START ON 12/07/2018] aspirin  325 mg Oral Daily   Or  . [START ON 12/07/2018] aspirin  300 mg Rectal Daily  . atorvastatin  40 mg Oral q1800  . diltiazem  60 mg Oral Q6H  . enoxaparin (LOVENOX) injection  40 mg Subcutaneous Q24H  . [START ON 12/07/2018] escitalopram  10 mg Oral Daily  . famotidine  40 mg Oral BID  . iopamidol  100 mL Intravenous Once  . metoprolol tartrate  25 mg Oral BID  . mirtazapine  45 mg Oral QHS  . QUEtiapine  50 mg Oral QHS  . sodium chloride flush  10-40 mL Intracatheter Q12H  . venlafaxine  50 mg Oral BID   Continuous Infusions: . dextrose 5 % and 0.9% NaCl 75 mL/hr at 12/06/18 0908  . diltiazem (CARDIZEM) infusion 15 mg/hr (12/06/18 1201)     LOS: 2 days    Time spent: 28 minutes    Alvira Philips [place], DO [facility] Pager [phone]  If 7PM-7AM, please contact night-coverage [email or web address] Password [email or web address] 12/06/2018, 12:10 PM

## 2018-12-06 NOTE — Progress Notes (Signed)
Modified Barium Swallow Progress Note  Patient Details  [name] MRN: [id number] Date of Birth: 03/15/1946  Today's Date: 12/06/2018  Modified Barium Swallow completed.  Full report located under Chart Review in the Imaging Section.  Brief recommendations include the following:  Clinical Impression  Patient presents with moderate-severe oral dysphagia. Oral stage characterized by lingual pumping, poor bolus formation, decreased bolus cohesion, and premature spillage. Penetration/trace aspiration of honey-thick liquids occurs with delayed sensation due to spillage from pyriforms entering laryngeal vestibule prior to closure. Pt sensed these episodes, but with delay. Chin tuck was not effective. No penetration or aspiration with puree. In general, smaller 1/2 teaspoon boluses of honey were better contained in the valleculae than larger spoonfuls or cup sips, though in instances where spillage to pyriforms occurred, there was trace penetration/aspiration. With soft solid, there was absent rotary mastication, lingual thrusting and premature spillage, moderate lingual residue. No abnormal pharyngeal residue. Recommend careful initiation of dysphagia 1 (puree) with pudding-thick liquids by teaspoon, meds crushed in puree, full supervision.    Swallow Evaluation Recommendations       SLP Diet Recommendations: Dysphagia 1 (Puree) solids;Pudding thick liquid   Liquid Administration via: Spoon   Medication Administration: Crushed with puree   Supervision: Full supervision/cueing for compensatory strategies   Compensations: Slow rate;Small sips/bites   Postural Changes: Seated upright at 90 degrees   Oral Care Recommendations: Oral care before and after PO   Other Recommendations: Order thickener from pharmacy;Prohibited food (jello, ice cream, thin soups);Remove water pitcher;Have oral suction available   Rondel Baton, MS, CCC-SLP Speech-Language Pathologist Acute  Rehabilitation Services Pager: [phone] Office: [phone]  Arlana Lindau 12/06/2018,12:41 PM

## 2018-12-06 NOTE — Progress Notes (Signed)
  Speech Language Pathology Treatment: Dysphagia;Cognitive-Linquistic  Patient Details [name] MRN: [id number] DOB: 1946-08-19 Today's Date: 12/06/2018 Time: 2111-5520 SLP Time Calculation (min) (ACUTE ONLY): 40 min  Assessment / Plan / Recommendation Clinical Impression  Pt seen for skilled ST treatment targeting dysphagia and cognition. Cognitive treatment: pt sleeping upon ST arrival, easily roused and remained alert with initial tactile cues. Facilitated attention and following commands in functional task, oral care, prior to presenting POs. Occasional repetition of 1 step commands required, and pt continues to repeat commands intermittently prior to following them. Extended processing time; less delay with responses to Y/N questions or verbal choice of two. SLP removed dried yellow secretions from lingual and palatal surfaces. Dysphagia tx: PO trials with ice chips, with cough x1 out of 10 trials. Progressed to half teaspoons of thin water, with overt signs of aspiration noted in 2/3 trials. She tolerated 1/2 teaspoons of puree and honey thick liquids without overt signs of aspiration. Oral containment improved; pt occasionally required verbal/tactile cues due to prolonged bolus formation/delayed oral transit. Pt tolerated several small, controlled cup sips of honey thick liquid with max assist. Per palliative care, pt amenable to short-term nutrition (cortrak) if necessary, but no PEG. As she is showing signs of improvement, recommend MBS to determine if pt able to safely take POs. Will attempt later this morning. D/w RN.     HPI HPI: 73 y.o. female with new left sided weakness, unknown time of onset. Neurology Exam reveals left facial droop, left hemiplegia, rightward eye deviation, dysarthria and left hemineglect, consistent with a large right MCA infarction. CT head reveals hypoattenuation within the right basal ganglia involving the caudate nucleus, lentiform nucleus, and  posterior limb of internal capsule, compatible with acute/early subacute infarction. MRI shows R BG, small right posterior temporal infarcts.      SLP Plan  Continue with current plan of care       Recommendations  Diet recommendations: NPO(MBS pending) Liquids provided via: Teaspoon Medication Administration: Via alternative means                Oral Care Recommendations: Oral care QID Follow up Recommendations: Inpatient Rehab SLP Visit Diagnosis: Dysarthria and anarthria (R47.1);Cognitive communication deficit (R41.841);Dysphagia, oropharyngeal phase (R13.12) Plan: Continue with current plan of care       GO              Rondel Baton, MS, CCC-SLP Speech-Language Pathologist Acute Rehabilitation Services Pager: [phone] Office: [phone]   Arlana Lindau 12/06/2018, 8:47 AM

## 2018-12-07 DIAGNOSIS — I4821 Permanent atrial fibrillation: Secondary | ICD-10-CM

## 2018-12-07 DIAGNOSIS — Z66 Do not resuscitate: Secondary | ICD-10-CM

## 2018-12-07 DIAGNOSIS — R4182 Altered mental status, unspecified: Secondary | ICD-10-CM

## 2018-12-07 DIAGNOSIS — R1310 Dysphagia, unspecified: Secondary | ICD-10-CM

## 2018-12-07 LAB — BASIC METABOLIC PANEL
Anion gap: 5 (ref 5–15)
BUN: 10 mg/dL (ref 8–23)
CALCIUM: 8.7 mg/dL — AB (ref 8.9–10.3)
CO2: 23 mmol/L (ref 22–32)
Chloride: 111 mmol/L (ref 98–111)
Creatinine, Ser: 0.74 mg/dL (ref 0.44–1.00)
GFR calc Af Amer: >60 mL/min (ref >60)
GFR calc non Af Amer: >60 mL/min (ref >60)
Glucose, Bld: 122 mg/dL — ABNORMAL HIGH (ref 70–99)
Potassium: 3.9 mmol/L (ref 3.5–5.1)
Sodium: 139 mmol/L (ref 135–145)

## 2018-12-07 LAB — CBC
HCT: 30.7 % — ABNORMAL LOW (ref 36.0–46.0)
Hemoglobin: 9.4 g/dL — ABNORMAL LOW (ref 12.0–15.0)
MCH: 28.7 pg (ref 26.0–34.0)
MCHC: 30.6 g/dL (ref 30.0–36.0)
MCV: 93.9 fL (ref 80.0–100.0)
Platelets: 309 10*3/uL (ref 150–400)
RBC: 3.27 MIL/uL — ABNORMAL LOW (ref 3.87–5.11)
RDW: 14.6 % (ref 11.5–15.5)
WBC: 5.1 10*3/uL (ref 4.0–10.5)
nRBC: 0 % (ref 0.0–0.2)

## 2018-12-07 LAB — GLUCOSE, CAPILLARY: GLUCOSE-CAPILLARY: 102 mg/dL — AB (ref 70–99)

## 2018-12-07 LAB — PHOSPHORUS: Phosphorus: 3.4 mg/dL (ref 2.5–4.6)

## 2018-12-07 LAB — MAGNESIUM: Magnesium: 1.7 mg/dL (ref 1.7–2.4)

## 2018-12-07 MED ORDER — OSMOLITE 1.2 CAL PO LIQD
1000.0000 mL | ORAL | Status: DC
  Administered 2018-12-08 – 2018-12-16 (×9): 1000 mL
  Filled 2018-12-07 (×17): qty 1000

## 2018-12-07 MED ORDER — MAGNESIUM SULFATE 2 GM/50ML IV SOLN
2.0000 g | Freq: Once | INTRAVENOUS | Status: AC
  Administered 2018-12-07: 2 g via INTRAVENOUS
  Filled 2018-12-07: qty 50

## 2018-12-07 MED ORDER — DEXTROSE-NACL 5-0.9 % IV SOLN
INTRAVENOUS | Status: DC
  Administered 2018-12-07: 75 mL via INTRAVENOUS

## 2018-12-07 MED ORDER — PRO-STAT SUGAR FREE PO LIQD
30.0000 mL | Freq: Every day | ORAL | Status: DC
  Administered 2018-12-07 – 2018-12-18 (×11): 30 mL
  Filled 2018-12-07 (×12): qty 30

## 2018-12-07 MED ORDER — JEVITY 1.2 CAL PO LIQD: 1000.0000 mL | ORAL | Status: DC

## 2018-12-07 NOTE — Progress Notes (Addendum)
Palliative:  Patient somewhat lethargic. She does easily arouse and answers questions appropriately. SLP at the bedside attempting to assess patient tolerance to po with magic cup. Not tolerating well with continuous coughing. Patient is awake during assessment. She was able to discuss life's history of being a Scientist, clinical (histocompatibility and immunogenetics) and living in [place] previously. She appears to be in no apparent distress outside of coughing with bites.   Her sister, [name] is at the bedside. She is tearful and concerned that patient is more lethargic on today and also with her poor appetite and signs of aspiration. She confirms wishes for no long-term or short-term PEG tube. Patient/sister are in agreement to short-term trial of Coretrak to allow patient some time, to see if she will have a better response with eating.   I discussed in details with sister trajectory of CVA and also nutritional barriers, if patient continuous to show signs of aspiration once Coretrak is removed. We discussed risk of aspiration pneumonia and malnutrition. Sister verbalized understanding. I educated sister on temporary use of Coretrak. She verbalized understanding. Goal is to attempt trial for several days. [name] is aware once Coretrak comes out patient will be fed by mouth and acknowledges risk, but also remains hopeful for improvement.   She is tearful in conversation and expresses she does not want her sister to suffer. She also knows that aspiration pneumonia can be very upsetting and uncomfortable for patients. She expressed her mother, sister, and their brother all passed away due to similar complications with a primary condition of [place]ting[place]'s Disease. She expressed awareness of risk and complications of aspiration pneumonia and she also knows what it takes to manage a PEG. [name] reported her sister was direct in expressing her wishes and that rather short term or long-term she did not want a PEG or any other heroic measures such as CPR or  life-support. Support and comfort given.   [name] again tearful and expressed, patient informed her after she first came in that she had been happy with her life and she was ready to go when it was time. She did not want to prolong with medical interventions. Sister expressed she want to honor those wishes and in the event patient shows signs of decline or inability to participate in therapy she would consider a more comfort care approach. Support given.   All questions answered and support provided to patient and sister. Family knows to contact me with any further questions and I will continue to support while hospitalized.   Assessment: -lethargic, easily aroused, oriented x3, left hemiplegia, obese, Irregular, S1S2, left facial droop, bowel sound present x4  Plan: -DNR/DNI -Trial period with Coretrak with hopes patient will have increased response to po intake and decreased signs/risk of aspiration. If patient continues to show signs of aspiration post-removal of Coretrak, patient/family has opted for NO PEG, will allow comfort feeds with understanding of aspiration risk -Sister remains hopeful, watchful waiting for signs of improvement or stability. If no further improvement or patient declines, sister open to a more comfort approach and hospice.  -PMT will continue to follow and support.    Total Time: 45 min  Greater than 50%  of this time was spent counseling and coordinating care related to the above assessment and plan  [name], AGPCNP-BC Palliative Medicine Team  Pager: [phone] Amion: N. Cousar

## 2018-12-07 NOTE — Progress Notes (Signed)
Nutrition Follow-up  DOCUMENTATION CODES:   Morbid obesity  INTERVENTION:  Once Cortrak placed and ready for use, Initiate Osmolite 1.2 formula @ 20 ml/hr via NGT and increase by 10 ml every 4 hours to goal rate of 60 ml/hr.   30 ml Prostat once daily.    Tube feeding regimen provides 1828 kcal (100% of needs), 95 grams of protein, and 1181 ml of H2O.   Once IV fluids are discontinued, recommend free water flushes of 150 ml TID per tube to provide total water 1631 ml.   NUTRITION DIAGNOSIS:   Inadequate oral intake related to acute illness, dysphagia as evidenced by NPO status; ongoing  GOAL:   Patient will meet greater than or equal to 90% of their needs; not met  MONITOR:   TF tolerance, Labs, Weight trends  REASON FOR ASSESSMENT:   Consult Enteral/tube feeding initiation and management  ASSESSMENT:    73 yo female admitted with acute ischemic CVA with left-sided weakness, dysarthria and dysphagia. PMH includes depression/anxiety, HTN  Pt is currently on a dysphagia 1 diet with pudding thick liquids. Meal completion 10% at lunch today. Pt lethargic. Per SLP, pt with weak cough following each bite consumed concerning with aspiration and reduced airway protection. Pt continues with poor PO intake and high aspiration risk, thus recommends NPO status. Per pt goals of care, pt and family have decided against PEG. Family agreeable with temporary trial of Cortrak NGT enteral feedings. RD to order tube feeding orders. Tube feedings to be initiated once NGT placed and ready for use. Labs and medications reviewed.   Diet Order:   Diet Order            DIET - DYS 1 Room service appropriate? Yes with Assist; Fluid consistency: Pudding Thick  Diet effective now              EDUCATION NEEDS:   Not appropriate for education at this time  Skin:  Skin Assessment: Reviewed RN Assessment  Last BM:  2/26 - medication given  Height:   Ht Readings from Last 1 Encounters:   12/03/18 '5\' 3"'  (1.6 m)    Weight:   Wt Readings from Last 1 Encounters:  12/03/18 107.8 kg    Ideal Body Weight:  52.3 kg  BMI:  Body mass index is 42.1 kg/m.  Estimated Nutritional Needs:   Kcal:  1800-2000 kcals   Protein:  90-100 g   Fluid:  >/= 1.8 L    Corrin Parker, MS, RD, LDN Pager # [phone] After hours/ weekend pager # [phone]

## 2018-12-07 NOTE — Progress Notes (Signed)
  Speech Language Pathology Treatment: Dysphagia  Patient Details [name] MRN: [id number] DOB: 1946/05/23 Today's Date: 12/07/2018 Time: 1329-1400 SLP Time Calculation (min) (ACUTE ONLY): 31 min  Assessment / Plan / Recommendation Clinical Impression  Pt lethargic, arouses briefly to answer questions, but falls back to sleep quickly.  Sister present. Poor PO intake today due to LOA.  Pt encouraged to by this SLP to eat portion of her lunch - she consumed four teaspoons of Magic Cup, each of which elicited consistent, weak coughing despite precautions/cues to improve safety.  Signs are concerning for aspiration, reduced airway protection.  Pt continues with poor PO intake and high aspiration risk. NMarland Kitchen Pickenpack-Cousar from Palliative Medicine present.  Sister would like to pursue trial of cortrak to allow some time for improvements.  She understands the temporary nature of enteral feeding; she and pt have decided against a PEG.  SLP will continue to follow for therapeutic PO trials, cognition/speech. D/W RN.     HPI HPI: 73 y.o. female with new left sided weakness, unknown time of onset. Neurology Exam reveals left facial droop, left hemiplegia, rightward eye deviation, dysarthria and left hemineglect, consistent with a large right MCA infarction. CT head reveals hypoattenuation within the right basal ganglia involving the caudate nucleus, lentiform nucleus, and posterior limb of internal capsule, compatible with acute/early subacute infarction. MRI shows R BG, small right posterior temporal infarcts.      SLP Plan  Continue with current plan of care       Recommendations  Diet recommendations: NPO Medication Administration: Via alternative means                Oral Care Recommendations: Oral care QID Follow up Recommendations: Skilled Nursing facility SLP Visit Diagnosis: Dysphagia, oral phase (R13.11);Dysphagia, oropharyngeal phase (R13.12) Plan: Continue with current  plan of care       GO               Lydiah Pong L. Samson Frederic, MA CCC/SLP Acute Rehabilitation Services Office number [phone] Pager [phone]   Blenda Mounts Laurice 12/07/2018, 2:02 PM

## 2018-12-07 NOTE — Plan of Care (Signed)
Pt is progressing toward expected goals

## 2018-12-07 NOTE — Progress Notes (Signed)
Physical Therapy Treatment Patient Details [name] MRN: [id number] DOB: Jun 03, 1946 Today's Date: 12/07/2018    History of Present Illness Pt is a 73 y.o. female with medical history significant for depression with anxiety, hypertension, atrial fibrillation not anticoagulated due to recurrent GI bleeding, now presenting to the ED from her nursing facility for evaluation of left-sided weakness, dysarthria, and fall. 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 Smaller area of acute infarct right posterior temporal lobe. Pt with recent hospital admission and discharged to SNF on 2/13.     PT Comments    Pt presented in bed and just waking up. Pt agreed to attempt therapy. Pt presented with urinary incontinence and participated in bed mobility to perform pt hygiene. Pt mod A +2 for rolling to left side of bed, and max A +2 to roll to her right side. Pt tolerated therex interventions, but remains unable to initiate movement on the entirety of her L side. Pt able to identify her L side and take ownership of L side. Pt left with call bell/phone, all needs within reach, and bed alarm set. Pt discharge plan to SNF remains appropriate at this time. Plan to progress with bed mobility and transfers as tolerated. Vitals: pt remained ~100-110 BPM through duration of treatment.  Follow Up Recommendations  SNF     Equipment Recommendations  None recommended by PT    Recommendations for Other Services       Precautions / Restrictions Precautions Precautions: Fall Precaution Comments: monitor HR Restrictions Weight Bearing Restrictions: No    Mobility  Bed Mobility Overal bed mobility: Needs Assistance Bed Mobility: Rolling Rolling: Mod assist;Max assist;+2 for physical assistance         General bed mobility comments: modA+2 rolling towards L; maxA+2 rolling towards R  Transfers                    Ambulation/Gait                 Stairs              Wheelchair Mobility    Modified Rankin (Stroke Patients Only) Modified Rankin (Stroke Patients Only) Pre-Morbid Rankin Score: Moderately severe disability Modified Rankin: Severe disability     Balance                                            Cognition Arousal/Alertness: Lethargic Behavior During Therapy: Flat affect Overall Cognitive Status: Impaired/Different from baseline Area of Impairment: Orientation;Following commands;Memory;Problem solving                     Memory: Decreased short-term memory Following Commands: Follows one step commands with increased time;Follows one step commands inconsistently     Problem Solving: Slow processing;Decreased initiation;Requires verbal cues;Requires tactile cues        Exercises General Exercises - Lower Extremity Ankle Circles/Pumps: AROM;10 reps;Supine;PROM;Both(LLE flaccid) Quad Sets: AROM;10 reps;Supine;PROM;Both(LLE flaccid) Heel Slides: AROM;10 reps;Supine;PROM;Both(LLE flaccid) Hip ABduction/ADduction: Supine;AROM;10 reps;PROM;Both(LLE flaccid)    General Comments        Pertinent Vitals/Pain Faces Pain Scale: Hurts a little bit    Home Living                      Prior Function            PT Goals (  current goals can now be found in the care plan section) Acute Rehab PT Goals PT Goal Formulation: Patient unable to participate in goal setting Potential to Achieve Goals: Fair    Frequency    Min 2X/week      PT Plan Current plan remains appropriate    Co-evaluation              AM-PAC PT "6 Clicks" Mobility   Outcome Measure  Help needed turning from your back to your side while in a flat bed without using bedrails?: Total Help needed moving from lying on your back to sitting on the side of a flat bed without using bedrails?: Total Help needed moving to and from a bed to a chair (including a wheelchair)?: Total Help needed standing up from  a chair using your arms (e.g., wheelchair or bedside chair)?: Total Help needed to walk in hospital room?: Total Help needed climbing 3-5 steps with a railing? : Total 6 Click Score: 6    End of Session Equipment Utilized During Treatment: Gait belt Activity Tolerance: Patient limited by lethargy Patient left: in bed;with call bell/phone within reach;with bed alarm set Nurse Communication: Mobility status;Other (comment) PT Visit Diagnosis: Other abnormalities of gait and mobility (R26.89);Muscle weakness (generalized) (M62.81);Other symptoms and signs involving the nervous system (R29.898)     Time: 1735-6701 PT Time Calculation (min) (ACUTE ONLY): 23 min  Charges:  $Therapeutic Exercise: 8-22 mins $Therapeutic Activity: 8-22 mins                     Margarita Mail, SPTA   Margarita Mail 12/07/2018, 5:28 PM

## 2018-12-07 NOTE — Care Management Important Message (Signed)
Important Message  Patient Details  [name] MRN: [id number] Date of Birth: Aug 06, 1946   Medicare Important Message Given:  Yes    Dorena Bodo 12/07/2018, 4:25 PM

## 2018-12-07 NOTE — Progress Notes (Signed)
CSW called and left a message for patient's sister regarding SNF placement.   CSW is awaiting a return phone call.   Drucilla Schmidt, MSW, LCSW-A Clinical Social Worker [employer]

## 2018-12-07 NOTE — Progress Notes (Signed)
PROGRESS NOTE    Adrienne Stone  WRU:[id number] DOB: 1945-11-16 DOA: 12/03/2018 PCP: [name], PA-C   Brief Narrative:  HPI per Dr. Odie Sera on 12/03/2018 Adrienne Stone is a 73 y.o. female with medical history significant for depression with anxiety, hypertension, atrial fibrillation not anticoagulated due to recurrent GI bleeding, now presenting to the emergency department from her nursing facility for evaluation of left-sided weakness, dysarthria, and fall.  Patient is accompanied by her sister who assists with the history.  Nursing facility personnel reported that the patient fell at approximately 1:30 PM, but had told the patient's sister that she fell at approximately 5 PM.  Around the same time, she was noted to have a new speech difficulty and was not moving her left side very well.  EMS was called and she was brought into the ED for evaluation of this.  Patient's ability to provide a history is limited by her severe dysarthria.  Her sister was with her yesterday, she seemed to be in her usual state at that time, and was not complaining of anything.  ED Course: Upon arrival to the ED, patient is found to be afebrile and mildly hypertensive.  EKG features atrial fibrillation with PVC.  Chest x-ray is notable for cardiomegaly, but no acute finding.  Noncontrast head CT reveals hypoattenuation within the right basal ganglia concerning for acute/early subacute CVA, as well as a dense right M1 segment concerning for thrombus.  Chemistry panel features a potassium of 5.3 and bilirubin 1.3.  CBC is notable for a chronic normocytic anemia.  Troponin is undetectable.  Patient was given 500 cc normal saline.  Neurology was consulted by the ED physician and recommended medical admission for further evaluation and management.  **He was more somnolent today and unable to provide a subjective history.  Per discussion with sister she would like to try a core track as a temporary measure for  nutrition so core track team was consulted however unfortunately they will be able to get to the patient for quite some time so than I placed a consult for IR to evaluate and treat.  Assessment & Plan:   Principal Problem:   Acute ischemic stroke [facility]) Active Problems:   Anemia   Unspecified atrial fibrillation (HCC)   History of GI bleed   Depression   Hypertension   Cerebrovascular accident (CVA) (HCC)   Goals of care, counseling/discussion   Palliative care by specialist  Acute Ischemic CVA -Patient presented with acute onset left-sided weakness with slurred speech following a fall at her skilled nursing facility. -History of atrial fibrillation not on chronic anticoagulation secondary to recurrent GI bleeding.   -Imaging significant for an acute right basal ganglia/internal capsule/right posterior temporal lobe ischemic stroke.   -Patient continues with lethargy, difficulty with speech, left-sided weakness both upper and lower extremity. Was very Somnolent and unable to take po  -Neurology following, appreciate Assistance -Hemoglobin A1c 4.9, LDL 83, TTE on 2/3 w/ EF 60-65%, afib, no source of embolism noted -MRA focal right M1 segment flow loss, narrowing right V4 segment -Carotid Doppler: bilateral ICA w/ 1-39% stenosis; right vertebral artery with high resistant flow -PT/OT recommend SNF -Speech therapy recommends alternative means due to dysphasia -Continue Aspirin PR until nutritional access obtained; Was on Dysphagia 1 Diet with Pudding Thick Liquid but very somnolent today -Atorvastatin 40 mg ordered, awaiting Cortrak tube placement -Allow permissive hypertension for 5-7 days per Neurology  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   -  Speech therapy following, failed swallow evaluation, with recommendations of alternative means of nutrition. -MBS 3/1 and per speech therapy passed for Puree Diet with Pudding Thick Liquids but very Somnolent and  made NPO again  -Continue IV fluid hydration and renewed order today  -Re-ordered Cortak placement for now and unfortunately won't be able to be done any time soon so have placed IR Consult -Continue speech therapy  Hyperlipidemia -LDL 83 with a goal less than 70.  HDL 62. -C/w Atorvastatin 40mg  daily when able to take in nutrition via Cortrak or po Intake  Normocytic Anemia -Hb/Hct was 9.4/30.7 today;stable.   -History of GI bleed in the past with last EGD on 11/17/2018 with ulcer at that GJ anastomosis but no visible vessel.   -No signs of active bleeding. -On Aspirin as above for acute stroke, unlikely candidate for chronic anticoagulation due to her recurrent GI bleeds -Continue to Monitor for S/Sx of Bleeding -Repeat and Monitor CBC daily  Acute Hypoxic Respiratory Failure,Resolved -Recent admission for influenza with discharge to SNF on 11/04/2018.  No history of lung disease.  -Titrated off of oxygen and saturating 96% on Room Air  Back Pain -T/L-spine x-rays notable for a chronic L1 compression fracture, otherwise no acute abnormalities. -Pain control with Acetaminophen, Hydrocodone-Acetaminophen 1 tab po q6hprn Moderate pain and IV Morphine 1 mg q4hprn Severe Pain  Depression/Anxiety -Restarted Escitalopram 10 mg po Daily, Mirtazapine 45 mg po qHS, Quetiapine 50 mg po qHS, Lorazepam 0.5 mg po qHS, and Venlafaxine 50 mg po BID if able to tolerate po but unfortunately was too somnolent today so will order Cortrak Placement   Essential Hypertension -On Clonidine 0.1 mg p.o. twice daily, Cardizem XR 360 mg p.o. daily, Lisinopril 5 mg p.o. daily, Metoprolol tartrate 75 mg p.o. twice daily at home. -Allowing permissive hypertension as above for acute stroke -Cardizem 60 mg every 6 hours  -Metoprolol Tartrate 25 mg q12h   Persistent Atrial Fibrillation -EKG notable for persistent/permanent A. fib.   -Not on chronic anticoagulation due to recurrent GI bleeds, most recently  in February 2020.  Etiology of her stroke likely thrombotic event possibly from underlying A. Fib.   -Overnight on 2/29-3/1 heart rate increased and was persistently with A. fib with RVR.  -Started on a Cardizem gtt yesterday  -Continue Aspirin as above -Cardizem gtt now discontinued as Rates improved and now still in A Fib with Rates in 60's -Started Diltiazem 60 mg q6h  -Started metoprolol tartrate 25 mg q12h  -Metoprolol 5 mg IV q6h prn for HR >105 sustained for 5 minutes -Titrated off cardizem gtt but now unfortunately not able to tolerate much oral intake; Will need Cortrak placement as a temporary Measure per sister's request as patient is not eating well now and is somonolent -Continue to monitor on Telemetry  Ethics: Discussed with her Sister at bedside again.  Patient with significant flaccid paralysis with decreased sensation to her left upper and lower extremity.  Unclear if she will ever regain any functional status to this side.  She also has significant oral pharyngeal dysphasia from her stroke and SLP evaluated and had placed on Dysphagia 1 Diet with Pudding Thick Liquids but was extremely somnolent so made NPO and will have Cortrak placed temporarily.  Patient would not want PEG tube placed.  Palliative care following.   Hypomagnesemia -Patient magnesium level this morning was 1.7 -Replete with IV mag sulfate 2 g -Continue monitor replete as necessary -Repeat magnesium level in the a.m.  Morbid Obesity -Estimated  body mass index is 42.1 kg/m as calculated from the following:   Height as of this encounter:  (1.6 m).   Weight as of this encounter: 107.8 kg. -Dietician on board and has made recommendations once core track is placed for Osmolite 1.2 formula at 20 mL's per hour via NG tube and increase by 10 mL's every 4 hours for goal rate of 60 mL's per hour with 30 mL's pro-stat once daily along with free water flushes of 150 mL's 3 times daily per tube once IV fluids were  discontinued  DVT prophylaxis: Enoxaparin 40 mg sq q24h Code Status: FULL CODE Family Communication: Discussed with Sister at bedside Disposition Plan: SNF vs. Hospice once complete work-up is finished and Cortrak placed  Consultants:   Palliative Care  Neurology   Procedures:  Transthoracic echocardiogram 11/09/2018: 1. The left ventricle has normal systolic function of 60-65%. The cavity size is normal. There is moderate concentric left ventricular wall hypertrophy. . The left ventricular diastology could not be evaluatedsecondary to atrial fibrillation. 2. Severely dilated left atrial size. 3. There is moderate mitral annular calcification present. Mitral regurgitation is trivial by color flow Doppler. 4. No atrial level shunt detected by color flow Doppler.  Ultrasound carotid 12/04/2018: Right Carotid: Velocities in the right ICA are consistent with a 1-39% stenosis. Left Carotid: Velocities in the left ICA are consistent with a 1-39% stenosis. Vertebrals: Bilateral vertebral arteries demonstrate antegrade flow. Right vertebral artery demonstrates high resistant flow.  MBS 12/06/2018 - SLP recommending Dysphagia 1 (Puree) with Pudding thick Liquids   Antimicrobials:  Anti-infectives (From admission, onward)   None     Subjective: Seen and examined at bedside and she was elevated today and appears little bit somnolent and was not eating very much.  Nursing states that she is not awake enough to try her pured diet and took a few bites.  Was placed on Cardizem drip and this was discontinued yesterday as rate was better controlled.  Home regimen was initially tried to be restarted yesterday because she passed her swallow test with a pured diet and pudding thick liquids however this likely be held.  Discussed with the sister at bedside and she would like to try the Cortrak temporarily so I placed a consult however Cortrak team unable to get to the patient in a timely manner so  we will call IR.  Per sister she would not like her sister for a PEG tube to be placed.  Palliative care is following and family is hopeful for SNF placement.  Unable to obtain a subjective history from the patient but she denies any pain and she is very dysarthric and difficult to understand and her periods of waking.  Objective: Vitals:   12/07/18 0452 12/07/18 0500 12/07/18 0618 12/07/18 1146  BP:   (!) 145/84 (!) 148/55  Pulse:   91 94  Resp: (!) 21 (!) Temp:    98.6 F (37 C)  TempSrc:    Axillary  SpO2:   95% 98%  Weight:      Height:        Intake/Output Summary (Last 24 hours) at 12/07/2018 1555 Last data filed at 12/07/2018 0600 Gross per 24 hour  Intake 85 ml  Output 500 ml  Net -415 ml   Filed Weights   12/03/18 1808 12/03/18 2300  Weight: 107.5 kg 107.8 kg   Examination: Physical Exam:  Constitutional: WN/WD morbidly obese Caucasian female in, NAD and appears somnolent and  arouses but go back to sleep Eyes: Lids and conjunctivae normal, sclerae anicteric  ENMT: External Ears, Nose appear normal. Grossly normal hearing. Has a Left facial droop Neck: Appears normal, supple, no cervical masses, normal ROM, no appreciable thyromegaly Respiratory: Diminished to auscultation bilaterally, no wheezing, rales, rhonchi or crackles. Normal respiratory effort and patient is not tachypenic. No accessory muscle use. Unlabored Breathing  Cardiovascular: Irregularly Irregular, no murmurs / rubs / gallops. S1 and S2 auscultated. Trace extremity edema.  Abdomen: Soft, non-tender, Distended due to body habitus. No masses palpated. No appreciable hepatosplenomegaly. Bowel sounds positive x4.  GU: Deferred. Musculoskeletal: No clubbing / cyanosis of digits/nails. No joint deformity upper and lower extremities.  Skin: No rashes, lesions, ulcers on a limited skin evaluation. No induration; Warm and dry.  Neurologic: Has a facial droop and is dysarthric and partly aphasic when  awake. Left Side is completely flaccid.   Psychiatric: Impaired judgment and insight. Somnolent and not awake or oriented x 3. Appeared depressed and    Data Reviewed: I have personally reviewed following labs and imaging studies  CBC: Recent Labs  Lab 12/03/18 1956 12/04/18 0536 12/05/18 0505 12/06/18 0440 12/07/18 0538  WBC 7.5 6.0 6.5 5.8 5.1  NEUTROABS 5.6  --   --   --   --   HGB 10.2* 9.8* 10.1* 10.0* 9.4*  HCT 32.7* 31.0* 31.7* 32.5* 30.7*  MCV 95.1 94.5 92.7 93.7 93.9  PLT 319 291 314 299 309   Basic Metabolic Panel: Recent Labs  Lab 12/03/18 1956 12/04/18 0536 12/05/18 0505 12/06/18 0440 12/07/18 0538  NA 136 137 137 137 139  K 5.3* 4.1 3.9 3.8 3.9  CL 104 105 107 107 111  CO2 GLUCOSE 94 102* 126* 133* 122*  BUN 10 7* <5* 7* 10  CREATININE 0.94 0.78 0.64 0.73 0.74  CALCIUM 9.1 8.7* 8.6* 8.6* 8.7*  MG  --   --   --  1.6* 1.7  PHOS  --   --   --  2.9 3.4   GFR: Estimated Creatinine Clearance: 72.7 mL/min (by C-G formula based on SCr of 0.74 mg/dL). Liver Function Tests: Recent Labs  Lab 12/03/18 1956  AST 58*  ALT 14  ALKPHOS 40  BILITOT 1.3*  PROT 6.2*  ALBUMIN 3.3*   No results for input(s): LIPASE, AMYLASE in the last 168 hours. No results for input(s): AMMONIA in the last 168 hours. Coagulation Profile: No results for input(s): INR, PROTIME in the last 168 hours. Cardiac Enzymes: Recent Labs  Lab 12/03/18 1956  TROPONINI <0.03   BNP (last 3 results) No results for input(s): PROBNP in the last 8760 hours. HbA1C: No results for input(s): HGBA1C in the last 72 hours. CBG: No results for input(s): GLUCAP in the last 168 hours. Lipid Profile: No results for input(s): CHOL, HDL, LDLCALC, TRIG, CHOLHDL, LDLDIRECT in the last 72 hours. Thyroid Function Tests: No results for input(s): TSH, T4TOTAL, FREET4, T3FREE, THYROIDAB in the last 72 hours. Anemia Panel: No results for input(s): VITAMINB12, FOLATE, FERRITIN, TIBC, IRON,  RETICCTPCT in the last 72 hours. Sepsis Labs: No results for input(s): PROCALCITON, LATICACIDVEN in the last 168 hours.  No results found for this or any previous visit (from the past 240 hour(s)).   RN Pressure Injury Documentation:    Estimated body mass index is 42.1 kg/m as calculated from the following:   Height as of this encounter:  (1.6 m).   Weight as of this  encounter: 107.8 kg.  Malnutrition Type:  Nutrition Problem: Inadequate oral intake Etiology: acute illness, dysphagia   Malnutrition Characteristics:  Signs/Symptoms: NPO status   Nutrition Interventions:  Interventions: Tube feeding   Radiology Studies: Dg Swallowing Func-speech Pathology  Result Date: 12/06/2018 Objective Swallowing Evaluation: Type of Study: MBS-Modified Barium Swallow Study  Patient Details [name] MRN: [id number] Date of Birth: September 27, 1946 Today's Date: 12/06/2018 Time: SLP Start Time (ACUTE ONLY): 1101 -SLP Stop Time (ACUTE ONLY): 1125 SLP Time Calculation (min) (ACUTE ONLY): 24 min Past Medical History: Past Medical History: Diagnosis Date . Depression  . Hypercholesteremia  . Hypertension  Past Surgical History: Past Surgical History: Procedure Laterality Date . ABDOMINAL HYSTERECTOMY   . BIOPSY  11/17/2018  Procedure: BIOPSY;  Surgeon: Benancio Deeds, MD;  Location: [facility] ENDOSCOPY;  Service: Gastroenterology;; . CHOLECYSTECTOMY   . ESOPHAGOGASTRODUODENOSCOPY (EGD) WITH PROPOFOL N/A 11/17/2018  Procedure: ESOPHAGOGASTRODUODENOSCOPY (EGD) WITH PROPOFOL;  Surgeon: Benancio Deeds, MD;  Location: [facility] ENDOSCOPY;  Service: Gastroenterology;  Laterality: N/A; . GASTRIC BYPASS   . thumb surgery   HPI: 73 y.o. female with new left sided weakness, unknown time of onset. Neurology Exam reveals left facial droop, left hemiplegia, rightward eye deviation, dysarthria and left hemineglect, consistent with a large right MCA infarction. CT head reveals hypoattenuation within the right  basal ganglia involving the caudate nucleus, lentiform nucleus, and posterior limb of internal capsule, compatible with acute/early subacute infarction. MRI shows R BG, small right posterior temporal infarcts.  Subjective: alert, cooperative Assessment / Plan / Recommendation CHL IP CLINICAL IMPRESSIONS 12/06/2018 Clinical Impression Patient presents with moderate-severe oral dysphagia. Oral stage characterized by lingual pumping, poor bolus formation, decreased bolus cohesion, and premature spillage. Penetration/trace aspiration of honey-thick liquids occurs with delayed sensation due to spillage from pyriforms entering laryngeal vestibule prior to closure. Pt sensed these episodes, but with delay. Chin tuck was not effective. No penetration or aspiration with puree. In general, smaller 1/2 teaspoon boluses of honey were better contained in the valleculae than larger spoonfuls or cup sips, though in instances where spillage to pyriforms occurred, there was trace penetration/aspiration. With soft solid, there was absent rotary mastication, lingual thrusting and premature spillage, moderate lingual residue. No abnormal pharyngeal residue. Recommend careful initiation of dysphagia 1 (puree) with pudding-thick liquids by teaspoon, meds crushed in puree, full supervision.  SLP Visit Diagnosis Dysphagia, oral phase (R13.11);Dysphagia, oropharyngeal phase (R13.12) Attention and concentration deficit following -- Frontal lobe and executive function deficit following -- Impact on safety and function Moderate aspiration risk   CHL IP TREATMENT RECOMMENDATION 12/06/2018 Treatment Recommendations Therapy as outlined in treatment plan below;F/U MBS in --- days (Comment)   Prognosis 12/06/2018 Prognosis for Safe Diet Advancement Good Barriers to Reach Goals Cognitive deficits Barriers/Prognosis Comment -- CHL IP DIET RECOMMENDATION 12/06/2018 SLP Diet Recommendations Dysphagia 1 (Puree) solids;Pudding thick liquid Liquid Administration  via Spoon Medication Administration Crushed with puree Compensations Slow rate;Small sips/bites Postural Changes Seated upright at 90 degrees   CHL IP OTHER RECOMMENDATIONS 12/06/2018 Recommended Consults -- Oral Care Recommendations Oral care before and after PO Other Recommendations Order thickener from pharmacy;Prohibited food (jello, ice cream, thin soups);Remove water pitcher;Have oral suction available   CHL IP FOLLOW UP RECOMMENDATIONS 12/06/2018 Follow up Recommendations Inpatient Rehab   CHL IP FREQUENCY AND DURATION 12/06/2018 Speech Therapy Frequency (ACUTE ONLY) min 2x/week Treatment Duration 2 weeks      CHL IP ORAL PHASE 12/06/2018 Oral Phase Impaired Oral - Pudding Teaspoon -- Oral - Pudding Cup --  Oral - Honey Teaspoon Weak lingual manipulation;Lingual pumping;Reduced posterior propulsion;Delayed oral transit;Decreased bolus cohesion;Premature spillage Oral - Honey Cup Weak lingual manipulation;Lingual pumping;Reduced posterior propulsion;Delayed oral transit;Decreased bolus cohesion;Premature spillage Oral - Nectar Teaspoon -- Oral - Nectar Cup -- Oral - Nectar Straw -- Oral - Thin Teaspoon -- Oral - Thin Cup -- Oral - Thin Straw -- Oral - Puree Weak lingual manipulation;Lingual pumping;Reduced posterior propulsion;Delayed oral transit;Decreased bolus cohesion;Premature spillage Oral - Mech Soft Impaired mastication;Weak lingual manipulation;Lingual pumping;Reduced posterior propulsion;Lingual/palatal residue;Delayed oral transit;Decreased bolus cohesion;Premature spillage Oral - Regular -- Oral - Multi-Consistency -- Oral - Pill -- Oral Phase - Comment --  CHL IP PHARYNGEAL PHASE 12/06/2018 Pharyngeal Phase Impaired Pharyngeal- Pudding Teaspoon -- Pharyngeal -- Pharyngeal- Pudding Cup -- Pharyngeal -- Pharyngeal- Honey Teaspoon Delayed swallow initiation-vallecula;Delayed swallow initiation-pyriform sinuses;Penetration/Aspiration during swallow;Trace aspiration Pharyngeal Material enters airway, remains  ABOVE vocal cords and not ejected out;Material enters airway, passes BELOW cords and not ejected out despite cough attempt by patient;Material does not enter airway Pharyngeal- Honey Cup Delayed swallow initiation-pyriform sinuses;Penetration/Aspiration during swallow;Trace aspiration Pharyngeal Material enters airway, remains ABOVE vocal cords and not ejected out;Material enters airway, passes BELOW cords and not ejected out despite cough attempt by patient Pharyngeal- Nectar Teaspoon -- Pharyngeal -- Pharyngeal- Nectar Cup -- Pharyngeal -- Pharyngeal- Nectar Straw -- Pharyngeal -- Pharyngeal- Thin Teaspoon -- Pharyngeal -- Pharyngeal- Thin Cup -- Pharyngeal -- Pharyngeal- Thin Straw -- Pharyngeal -- Pharyngeal- Puree Delayed swallow initiation-vallecula Pharyngeal -- Pharyngeal- Mechanical Soft Delayed swallow initiation-vallecula Pharyngeal -- Pharyngeal- Regular -- Pharyngeal -- Pharyngeal- Multi-consistency -- Pharyngeal -- Pharyngeal- Pill -- Pharyngeal -- Pharyngeal Comment --  CHL IP CERVICAL ESOPHAGEAL PHASE 12/06/2018 Cervical Esophageal Phase WFL Pudding Teaspoon -- Pudding Cup -- Honey Teaspoon -- Honey Cup -- Nectar Teaspoon -- Nectar Cup -- Nectar Straw -- Thin Teaspoon -- Thin Cup -- Thin Straw -- Puree -- Mechanical Soft -- Regular -- Multi-consistency -- Pill -- Cervical Esophageal Comment -- Rondel Baton, MS, CCC-SLP Speech-Language Pathologist Acute Rehabilitation Services Pager: [phone] Office: [phone] Arlana Lindau 12/06/2018, 12:42 PM              Scheduled Meds: . aspirin  325 mg Oral Daily   Or  . aspirin  300 mg Rectal Daily  . atorvastatin  40 mg Oral q1800  . diltiazem  60 mg Oral Q6H  . enoxaparin (LOVENOX) injection  40 mg Subcutaneous Q24H  . escitalopram  10 mg Oral Daily  . famotidine  40 mg Oral BID  . iopamidol  100 mL Intravenous Once  . metoprolol tartrate  25 mg Oral BID  . mirtazapine  45 mg Oral QHS  . QUEtiapine  50 mg Oral QHS  . sodium chloride flush   10-40 mL Intracatheter Q12H  . venlafaxine  50 mg Oral BID   Continuous Infusions: . diltiazem (CARDIZEM) infusion Stopped (12/06/18 1408)    LOS: 3 days   Merlene Laughter, DO [facility] PAGER is on AMION  If 7PM-7AM, please contact night-coverage [email or web address] Password [facility] 12/07/2018, 3:55 PM

## 2018-12-08 ENCOUNTER — Inpatient Hospital Stay (HOSPITAL_COMMUNITY): Payer: Medicare HMO

## 2018-12-08 LAB — GLUCOSE, CAPILLARY
Glucose-Capillary: 105 mg/dL — ABNORMAL HIGH (ref 70–99)
Glucose-Capillary: 107 mg/dL — ABNORMAL HIGH (ref 70–99)
Glucose-Capillary: 99 mg/dL (ref 70–99)
Glucose-Capillary: 92 mg/dL (ref 70–99)
Glucose-Capillary: 85 mg/dL (ref 70–99)
Glucose-Capillary: 89 mg/dL (ref 70–99)

## 2018-12-08 LAB — COMPREHENSIVE METABOLIC PANEL
ALT: 10 U/L (ref 0–44)
AST: 13 U/L — ABNORMAL LOW (ref 15–41)
Albumin: 2.7 g/dL — ABNORMAL LOW (ref 3.5–5.0)
Alkaline Phosphatase: 45 U/L (ref 38–126)
Anion gap: 4 — ABNORMAL LOW (ref 5–15)
BUN: 12 mg/dL (ref 8–23)
CO2: 26 mmol/L (ref 22–32)
Calcium: 8.6 mg/dL — ABNORMAL LOW (ref 8.9–10.3)
Chloride: 112 mmol/L — ABNORMAL HIGH (ref 98–111)
Creatinine, Ser: 0.76 mg/dL (ref 0.44–1.00)
GFR calc Af Amer: >60 mL/min (ref >60)
GFR calc non Af Amer: >60 mL/min (ref >60)
Glucose, Bld: 108 mg/dL — ABNORMAL HIGH (ref 70–99)
Potassium: 3.6 mmol/L (ref 3.5–5.1)
SODIUM: 142 mmol/L (ref 135–145)
TOTAL PROTEIN: 5.6 g/dL — AB (ref 6.5–8.1)
Total Bilirubin: 0.4 mg/dL (ref 0.3–1.2)

## 2018-12-08 LAB — CBC WITH DIFFERENTIAL/PLATELET
Abs Immature Granulocytes: 0.03 10*3/uL (ref 0.00–0.07)
Basophils Absolute: 0 10*3/uL (ref 0.0–0.1)
Basophils Relative: 0 %
Eosinophils Absolute: 0.4 10*3/uL (ref 0.0–0.5)
Eosinophils Relative: 9 %
HCT: 30.3 % — ABNORMAL LOW (ref 36.0–46.0)
Hemoglobin: 9.6 g/dL — ABNORMAL LOW (ref 12.0–15.0)
Immature Granulocytes: 1 %
Lymphocytes Relative: 23 %
Lymphs Abs: 1.2 10*3/uL (ref 0.7–4.0)
MCH: 29.6 pg (ref 26.0–34.0)
MCHC: 31.7 g/dL (ref 30.0–36.0)
MCV: 93.5 fL (ref 80.0–100.0)
Monocytes Absolute: 0.7 10*3/uL (ref 0.1–1.0)
Monocytes Relative: 13 %
NRBC: 0 % (ref 0.0–0.2)
Neutro Abs: 2.8 10*3/uL (ref 1.7–7.7)
Neutrophils Relative %: 54 %
Platelets: 328 10*3/uL (ref 150–400)
RBC: 3.24 MIL/uL — AB (ref 3.87–5.11)
RDW: 14.5 % (ref 11.5–15.5)
WBC: 5.2 10*3/uL (ref 4.0–10.5)

## 2018-12-08 LAB — MAGNESIUM: Magnesium: 1.9 mg/dL (ref 1.7–2.4)

## 2018-12-08 LAB — PHOSPHORUS: Phosphorus: 3.5 mg/dL (ref 2.5–4.6)

## 2018-12-08 MED ORDER — HYDROCODONE-ACETAMINOPHEN 5-325 MG PO TABS
1.0000 | ORAL_TABLET | Freq: Four times a day (QID) | ORAL | Status: DC | PRN
  Administered 2018-12-10 – 2018-12-18 (×8): 1
  Filled 2018-12-08 (×8): qty 1

## 2018-12-08 MED ORDER — ASPIRIN 300 MG RE SUPP: 300.0000 mg | Freq: Every day | RECTAL | Status: DC

## 2018-12-08 MED ORDER — LORAZEPAM 0.5 MG PO TABS
0.5000 mg | ORAL_TABLET | Freq: Every evening | ORAL | Status: DC | PRN
  Administered 2018-12-10: 0.5 mg
  Filled 2018-12-08: qty 1

## 2018-12-08 MED ORDER — METOPROLOL TARTRATE 25 MG PO TABS
25.0000 mg | ORAL_TABLET | Freq: Two times a day (BID) | ORAL | Status: DC
  Administered 2018-12-08 – 2018-12-11 (×6): 25 mg
  Filled 2018-12-08 (×6): qty 1

## 2018-12-08 MED ORDER — FAMOTIDINE 20 MG PO TABS
40.0000 mg | ORAL_TABLET | Freq: Two times a day (BID) | ORAL | Status: DC
  Administered 2018-12-08 – 2018-12-19 (×22): 40 mg
  Filled 2018-12-08 (×24): qty 2

## 2018-12-08 MED ORDER — ESCITALOPRAM OXALATE 10 MG PO TABS
10.0000 mg | ORAL_TABLET | Freq: Every day | ORAL | Status: DC
  Administered 2018-12-09 – 2018-12-19 (×10): 10 mg
  Filled 2018-12-08 (×11): qty 1

## 2018-12-08 MED ORDER — ACETAMINOPHEN 160 MG/5ML PO SOLN
650.0000 mg | ORAL | Status: DC | PRN
  Administered 2018-12-15: 650 mg
  Filled 2018-12-08: qty 20.3

## 2018-12-08 MED ORDER — LEVALBUTEROL HCL 0.63 MG/3ML IN NEBU
0.6300 mg | INHALATION_SOLUTION | Freq: Three times a day (TID) | RESPIRATORY_TRACT | Status: DC
  Administered 2018-12-08 – 2018-12-10 (×5): 0.63 mg via RESPIRATORY_TRACT
  Filled 2018-12-08 (×5): qty 3

## 2018-12-08 MED ORDER — SENNOSIDES-DOCUSATE SODIUM 8.6-50 MG PO TABS: 1.0000 | ORAL_TABLET | Freq: Every evening | ORAL | Status: DC | PRN

## 2018-12-08 MED ORDER — FUROSEMIDE 10 MG/ML IJ SOLN
40.0000 mg | Freq: Once | INTRAMUSCULAR | Status: AC
  Administered 2018-12-08: 40 mg via INTRAVENOUS
  Filled 2018-12-08: qty 4

## 2018-12-08 MED ORDER — QUETIAPINE FUMARATE 50 MG PO TABS
50.0000 mg | ORAL_TABLET | Freq: Every day | ORAL | Status: DC
  Administered 2018-12-08 – 2018-12-16 (×9): 50 mg
  Filled 2018-12-08 (×10): qty 1

## 2018-12-08 MED ORDER — VENLAFAXINE HCL 50 MG PO TABS
50.0000 mg | ORAL_TABLET | Freq: Two times a day (BID) | ORAL | Status: DC
  Administered 2018-12-08 – 2018-12-19 (×23): 50 mg
  Filled 2018-12-08 (×25): qty 1

## 2018-12-08 MED ORDER — DILTIAZEM HCL 30 MG PO TABS
60.0000 mg | ORAL_TABLET | Freq: Four times a day (QID) | ORAL | Status: DC
  Administered 2018-12-08 – 2018-12-18 (×39): 60 mg
  Filled 2018-12-08 (×39): qty 2

## 2018-12-08 MED ORDER — IOPAMIDOL (ISOVUE-300) INJECTION 61%
INTRAVENOUS | Status: AC
  Filled 2018-12-08: qty 50

## 2018-12-08 MED ORDER — LIDOCAINE VISCOUS HCL 2 % MT SOLN
15.0000 mL | Freq: Once | OROMUCOSAL | Status: AC
  Administered 2018-12-08: 4 mL via OROMUCOSAL
  Filled 2018-12-08: qty 15

## 2018-12-08 MED ORDER — IOPAMIDOL (ISOVUE-300) INJECTION 61%
50.0000 mL | Freq: Once | INTRAVENOUS | Status: AC | PRN
  Administered 2018-12-08: 20 mL

## 2018-12-08 MED ORDER — ATORVASTATIN CALCIUM 40 MG PO TABS
40.0000 mg | ORAL_TABLET | Freq: Every day | ORAL | Status: DC
  Administered 2018-12-08 – 2018-12-19 (×12): 40 mg
  Filled 2018-12-08 (×12): qty 1

## 2018-12-08 MED ORDER — ACETAMINOPHEN 325 MG PO TABS
650.0000 mg | ORAL_TABLET | ORAL | Status: DC | PRN
  Administered 2018-12-09 – 2018-12-19 (×4): 650 mg
  Filled 2018-12-08 (×5): qty 2

## 2018-12-08 MED ORDER — LIDOCAINE VISCOUS HCL 2 % MT SOLN
OROMUCOSAL | Status: AC
  Administered 2018-12-08: 4 mL via OROMUCOSAL
  Filled 2018-12-08: qty 15

## 2018-12-08 MED ORDER — ASPIRIN 325 MG PO TABS
325.0000 mg | ORAL_TABLET | Freq: Every day | ORAL | Status: DC
  Administered 2018-12-09 – 2018-12-24 (×15): 325 mg
  Filled 2018-12-08 (×16): qty 1

## 2018-12-08 MED ORDER — LEVALBUTEROL HCL 0.63 MG/3ML IN NEBU
0.6300 mg | INHALATION_SOLUTION | Freq: Four times a day (QID) | RESPIRATORY_TRACT | Status: DC
  Administered 2018-12-08 (×2): 0.63 mg via RESPIRATORY_TRACT
  Filled 2018-12-08 (×2): qty 3

## 2018-12-08 MED ORDER — MIRTAZAPINE 15 MG PO TABS
45.0000 mg | ORAL_TABLET | Freq: Every day | ORAL | Status: DC
  Administered 2018-12-08 – 2018-12-16 (×9): 45 mg
  Filled 2018-12-08 (×9): qty 3

## 2018-12-08 MED ORDER — IPRATROPIUM BROMIDE 0.02 % IN SOLN
0.5000 mg | Freq: Four times a day (QID) | RESPIRATORY_TRACT | Status: DC
  Administered 2018-12-08 (×2): 0.5 mg via RESPIRATORY_TRACT
  Filled 2018-12-08 (×2): qty 2.5

## 2018-12-08 MED ORDER — IPRATROPIUM BROMIDE 0.02 % IN SOLN
0.5000 mg | Freq: Three times a day (TID) | RESPIRATORY_TRACT | Status: DC
  Administered 2018-12-08 – 2018-12-10 (×5): 0.5 mg via RESPIRATORY_TRACT
  Filled 2018-12-08 (×5): qty 2.5

## 2018-12-08 MED ORDER — ACETAMINOPHEN 650 MG RE SUPP: 650.0000 mg | RECTAL | Status: DC | PRN

## 2018-12-08 NOTE — Progress Notes (Signed)
Cortrak Tube Team Note:  RD paged to place a bridle on a Cortrak NG tube that was placed by IR.  Tube secured with bridle at 72 cm.   Earma Reading, MS, RD, LDN Inpatient Clinical Dietitian Pager: [phone] Weekend/After Hours: [phone]

## 2018-12-08 NOTE — Plan of Care (Signed)
  Problem: Education: Goal: Knowledge of disease or condition will improve Outcome: Not Progressing   Problem: Self-Care: Goal: Ability to communicate needs accurately will improve Outcome: Not Progressing

## 2018-12-08 NOTE — Progress Notes (Signed)
SLP Cancellation Note  Patient Details [name] MRN: [id number] DOB: [birth year]   Cancelled treatment:       Reason Eval/Treat Not Completed: Fatigue/lethargy limiting ability to participate . The patient recently returned from cortrak placement.  Not sufficiently alert to participate in therapy.  D/W her sister who was at bedside.  SLP will continue to follow for readiness for POs.  Dondre Catalfamo L. Samson Frederic, MA CCC/SLP Acute Rehabilitation Services Office number [phone] Pager [phone]  Carolan Shiver 12/08/2018, 12:41 PM

## 2018-12-08 NOTE — Plan of Care (Signed)
Pt is progressing toward desired goal 

## 2018-12-08 NOTE — Progress Notes (Signed)
PROGRESS NOTE    Adrienne Stone  EAV:[id number] DOB: June 30, 1946 DOA: 12/03/2018 PCP: [name], PA-C   Brief Narrative:  HPI per Dr. Odie Sera on 12/03/2018 Adrienne Stone is a 73 y.o. female with medical history significant for depression with anxiety, hypertension, atrial fibrillation not anticoagulated due to recurrent GI bleeding, now presenting to the emergency department from her nursing facility for evaluation of left-sided weakness, dysarthria, and fall.  Patient is accompanied by her sister who assists with the history.  Nursing facility personnel reported that the patient fell at approximately 1:30 PM, but had told the patient's sister that she fell at approximately 5 PM.  Around the same time, she was noted to have a new speech difficulty and was not moving her left side very well.  EMS was called and she was brought into the ED for evaluation of this.  Patient's ability to provide a history is limited by her severe dysarthria.  Her sister was with her yesterday, she seemed to be in her usual state at that time, and was not complaining of anything.  ED Course: Upon arrival to the ED, patient is found to be afebrile and mildly hypertensive.  EKG features atrial fibrillation with PVC.  Chest x-ray is notable for cardiomegaly, but no acute finding.  Noncontrast head CT reveals hypoattenuation within the right basal ganglia concerning for acute/early subacute CVA, as well as a dense right M1 segment concerning for thrombus.  Chemistry panel features a potassium of 5.3 and bilirubin 1.3.  CBC is notable for a chronic normocytic anemia.  Troponin is undetectable.  Patient was given 500 cc normal saline.  Neurology was consulted by the ED physician and recommended medical admission for further evaluation and management.  **SHe was more somnolent again and unable to provide a subjective history.  Per discussion with sister yesterday she would like to try a Cortrak as a temporary  measure for nutrition so core track team was consulted however unfortunately they will be able to get to the patient for quite some time so than I placed a consult for IR to evaluate and treat. IR did not place Cortrak so it was done under Diagnostic Radiology today and all her medications are changed to per Tube today. Still remains a little somnolent.   Assessment & Plan:   Principal Problem:   Acute ischemic stroke [facility]) Active Problems:   Anemia   Unspecified atrial fibrillation (HCC)   History of GI bleed   Depression   Hypertension   Cerebrovascular accident (CVA) (HCC)   Goals of care, counseling/discussion   Palliative care by specialist  Acute Ischemic CVA -Patient presented with acute onset left-sided weakness with slurred speech following a fall at her skilled nursing facility. -History of atrial fibrillation not on chronic anticoagulation secondary to recurrent GI bleeding.   -Imaging significant for an acute right basal ganglia/internal capsule/right posterior temporal lobe ischemic stroke.   -Patient continues with lethargy, difficulty with speech, left-sided weakness both upper and lower extremity. Was very Somnolent and unable to take po  -Neurology following, appreciate Assistance -Hemoglobin A1c 4.9, LDL 83, TTE on 2/3 w/ EF 60-65%, afib, no source of embolism noted -MRA focal right M1 segment flow loss, narrowing right V4 segment -Carotid Doppler: bilateral ICA w/ 1-39% stenosis; right vertebral artery with high resistant flow -PT/OT recommend SNF -Speech therapy recommends alternative means due to dysphasia -Continue Aspirin PR until nutritional access obtained but now changed to per Tube Was on Dysphagia 1 Diet with  Pudding Thick Liquid but very somnolent today -Atorvastatin 40 mg and will go through Cortrak now that it has been placed -Allow permissive hypertension for 5-7 days per Neurology  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    -Speech therapy following, failed swallow evaluation, with recommendations of alternative means of nutrition. -MBS 3/1 and per speech therapy passed for Puree Diet with Pudding Thick Liquids but very Somnolent and made NPO again yesterday   -Continued IV fluid hydration and renewed order yesterday but now D/C'd given Respiratory Distress -Re-ordered Cortak placement for now and unfortunately won't be able to be done any time soon so have placed IR Consult -Continue speech therapy  Hyperlipidemia -LDL 83 with a goal less than 70.  HDL 62. -C/w Atorvastatin  daily via Cortrak or po Intake if it improves  Normocytic Anemia -Hb/Hct was 9.6/30.3 today;stable.   -History of GI bleed in the past with last EGD on 11/17/2018 with ulcer at that GJ anastomosis but no visible vessel.   -No signs of active bleeding. -On Aspirin as above for acute stroke, unlikely candidate for chronic anticoagulation due to her recurrent GI bleeds -Continue to Monitor for S/Sx of Bleeding -Repeat and Monitor CBC daily  Acute Hypoxic Respiratory Failure, worsened slightly today -Recent admission for influenza with discharge to SNF on 11/04/2018.  No history of lung disease.  -Titrated off of oxygen and saturating 96% on Room Air yesterday but had respiratory Distress -Discontinued IVF now and given IV Lasix 40 mg x1 -Started on Xopenex/Atrovent q6h scheduled -CXR showed "Enteric tube is identified with tip well below the level of the GE junction. Surgical clips are noted within the left upper quadrant of the abdomen. Normal heart size. No pleural effusion or edema. Aortic atherosclerosis. No airspace opacities. Coarsened interstitial markings throughout both lungs are again noted and appear unchanged."  Back Pain -T/L-spine x-rays notable for a chronic L1 compression fracture, otherwise no acute abnormalities. -Pain control with Acetaminophen, Hydrocodone-Acetaminophen 1 tab po q6hprn Moderate pain and IV  Morphine 1 mg q4hprn Severe Pain  Depression/Anxiety -Restarted Escitalopram 10 mg po Daily, Mirtazapine 45 mg po qHS, Quetiapine 50 mg po qHS, Lorazepam 0.5 mg po qHS, and Venlafaxine 50 mg po BID and changed so that could go through [employer] Placement   Essential Hypertension -On Clonidine 0.1 mg p.o. twice daily, Cardizem XR 360 mg p.o. daily, Lisinopril 5 mg p.o. daily, Metoprolol tartrate 75 mg p.o. twice daily at home. -Allowing permissive hypertension as above for acute stroke -Cardizem 60 mg every 6 hours via Cortrak -Metoprolol Tartrate 25 mg q12h via Cortrak  Persistent Atrial Fibrillation -EKG notable for persistent/permanent A. fib.   -Not on chronic anticoagulation due to recurrent GI bleeds, most recently in February 2020.  Etiology of her stroke likely thrombotic event possibly from underlying A. Fib.   -Overnight on 2/29-3/1 heart rate increased and was persistently with A. fib with RVR.  -Continue Aspirin as above -Cardizem gtt now discontinued as Rates improved and now still in A Fib with Rates in 60's -Started Diltiazem 60 mg q6h via Cortrak Tube -Started metoprolol tartrate 25 mg q12h via Cortrak Tube -Metoprolol 5 mg IV q6h prn for HR >105 sustained for 5 minutes -Titrated off cardizem gtt but now unfortunately not able to tolerate much oral intake; Will need Cortrak placement as a temporary Measure per sister's request as patient is not eating well now and is somonolent -Continue to monitor on Telemetry  Ethics: Discussed with her Sister at  bedside again.  Patient with significant flaccid paralysis with decreased sensation to her left upper and lower extremity.  Unclear if she will ever regain any functional status to this side.  She also has significant oral pharyngeal dysphasia from her stroke and SLP evaluated and had placed on Dysphagia 1 Diet with Pudding Thick Liquids but was extremely somnolent so made NPO and will have Cortrak placed temporarily and this was  done today. Patient would not want PEG tube placed.  Palliative care following.   Hypomagnesemia -Patient magnesium level this morning was 1.9 -Continue monitor replete as necessary -Repeat magnesium level in the a.m.  Morbid Obesity -Estimated body mass index is 42.1 kg/m as calculated from the following:   Height as of this encounter: 5\' 3"  (1.6 m).   Weight as of this encounter: 107.8 kg. -Dietician on board and has made recommendations once core track is placed for Osmolite 1.2 formula at 20 mL's per hour via NG tube and increase by 10 mL's every 4 hours for goal rate of 60 mL's per hour with 30 mL's pro-stat once daily along with free water flushes of 150 mL's 3 times daily per tube once IV fluids were discontinued  DVT prophylaxis: Enoxaparin 40 mg sq q24h Code Status: FULL CODE Family Communication: No family at bedside  Disposition Plan: SNF vs. Hospice once complete work-up is finished and Cortrak placed  Consultants:   Palliative Care  Neurology   Procedures:  Transthoracic echocardiogram 11/09/2018: 1. The left ventricle has normal systolic function of 60-65%. The cavity size is normal. There is moderate concentric left ventricular wall hypertrophy. . The left ventricular diastology could not be evaluatedsecondary to atrial fibrillation. 2. Severely dilated left atrial size. 3. There is moderate mitral annular calcification present. Mitral regurgitation is trivial by color flow Doppler. 4. No atrial level shunt detected by color flow Doppler.  Ultrasound carotid 12/04/2018: Right Carotid: Velocities in the right ICA are consistent with a 1-39% stenosis. Left Carotid: Velocities in the left ICA are consistent with a 1-39% stenosis. Vertebrals: Bilateral vertebral arteries demonstrate antegrade flow. Right vertebral artery demonstrates high resistant flow.  MBS 12/06/2018 - SLP recommending Dysphagia 1 (Puree) with Pudding thick Liquids   Antimicrobials:   Anti-infectives (From admission, onward)   None     Subjective: Seen and examined at bedside and still a little somnolent today and minimally interactive.  Had a core track placed and no family at bedside.  Nursing reported that she went into respiratory distress this morning and she improved after fluids were stopped and breathing treatments were given as well as a dose of IV Lasix.  Have tube feedings start through her Cortrak when able.  Objective: Vitals:   12/08/18 0405 12/08/18 1005 12/08/18 1229 12/08/18 1454  BP: (!) 108/50  (!) 156/70   Pulse: 90  97   Resp: 15  (!) 24   Temp: 98.5 F (36.9 C)  98.6 F (37 C)   TempSrc: Axillary  Oral   SpO2: 98% 99% 100% 97%  Weight:      Height:        Intake/Output Summary (Last 24 hours) at 12/08/2018 1633 Last data filed at 12/08/2018 1122 Gross per 24 hour  Intake 375 ml  Output 1100 ml  Net -725 ml   Filed Weights   12/03/18 1808 12/03/18 2300  Weight: 107.5 kg 107.8 kg   Examination: Physical Exam:  Constitutional: Morbidly obese Caucasian female currently no acute distress appears somnolent and not very interactive  again today Eyes: Lids and conjunctive are normal.  Sclera anicteric ENMT: External ears and nose appear normal.  Grossly normal hearing.  Has a left facial droop and has a core track to placed in her right nare Neck: Appears supple no JVD Respiratory: Diminished auscultation bilaterally no patient wheezing, rales, rhonchi.  Patient is not tachypneic or using any accessory muscles to breathe but was put back on 2 L of nasal cannula Cardiovascular: Irregular but not tachycardic.  No appreciable murmurs, rubs, gallops has mild lower extremity edema Abdomen: Soft, nontender, no secondary body habitus.  Bowel sounds present GU: Deferred Musculoskeletal: No contractures or cyanosis.  No joint deformities palpable extremities Skin: Skin is warm dry no appreciable rashes or lesions Per skin evaluation Neurologic: Has  a facial droop on the left side is partially dysarthric and aphasic.  Left side is completely flaccid Psychiatric: Impaired judgment and insight.  Patient is still somnolent and not awake and oriented x3.  Appears depressed    Data Reviewed: I have personally reviewed following labs and imaging studies  CBC: Recent Labs  Lab 12/03/18 1956 12/04/18 0536 12/05/18 0505 12/06/18 0440 12/07/18 0538 12/08/18 0519  WBC 7.5 6.0 6.5 5.8 5.1 5.2  NEUTROABS 5.6  --   --   --   --  2.8  HGB 10.2* 9.8* 10.1* 10.0* 9.4* 9.6*  HCT 32.7* 31.0* 31.7* 32.5* 30.7* 30.3*  MCV 95.1 94.5 92.7 93.7 93.9 93.5  PLT 319 291 314 299 309 328   Basic Metabolic Panel: Recent Labs  Lab 12/04/18 0536 12/05/18 0505 12/06/18 0440 12/07/18 0538 12/08/18 0519  NA 137 137 137 139 142  K 4.1 3.9 3.8 3.9 3.6  CL 105 107 107 111 112*  CO2 23 24 22 23 26   GLUCOSE 102* 126* 133* 122* 108*  BUN 7* <5* 7* 10 12  CREATININE 0.78 0.64 0.73 0.74 0.76  CALCIUM 8.7* 8.6* 8.6* 8.7* 8.6*  MG  --   --  1.6* 1.7 1.9  PHOS  --   --  2.9 3.4 3.5   GFR: Estimated Creatinine Clearance: 72.7 mL/min (by C-G formula based on SCr of 0.76 mg/dL). Liver Function Tests: Recent Labs  Lab 12/03/18 1956 12/08/18 0519  AST 58* 13*  ALT 14 10  ALKPHOS 40 45  BILITOT 1.3* 0.4  PROT 6.2* 5.6*  ALBUMIN 3.3* 2.7*   No results for input(s): LIPASE, AMYLASE in the last 168 hours. No results for input(s): AMMONIA in the last 168 hours. Coagulation Profile: No results for input(s): INR, PROTIME in the last 168 hours. Cardiac Enzymes: Recent Labs  Lab 12/03/18 1956  TROPONINI <0.03   BNP (last 3 results) No results for input(s): PROBNP in the last 8760 hours. HbA1C: No results for input(s): HGBA1C in the last 72 hours. CBG: Recent Labs  Lab 12/08/18 0124 12/08/18 0456 12/08/18 0743 12/08/18 1245 12/08/18 1615  GLUCAP 105* 107* 99 92 85   Lipid Profile: No results for input(s): CHOL, HDL, LDLCALC, TRIG, CHOLHDL,  LDLDIRECT in the last 72 hours. Thyroid Function Tests: No results for input(s): TSH, T4TOTAL, FREET4, T3FREE, THYROIDAB in the last 72 hours. Anemia Panel: No results for input(s): VITAMINB12, FOLATE, FERRITIN, TIBC, IRON, RETICCTPCT in the last 72 hours. Sepsis Labs: No results for input(s): PROCALCITON, LATICACIDVEN in the last 168 hours.  No results found for this or any previous visit (from the past 240 hour(s)).   RN Pressure Injury Documentation:    Estimated body mass index is 42.1 kg/m as  calculated from the following:   Height as of this encounter:  (1.6 m).   Weight as of this encounter: 107.8 kg.  Malnutrition Type:  Nutrition Problem: Inadequate oral intake Etiology: acute illness, dysphagia   Malnutrition Characteristics:  Signs/Symptoms: NPO status   Nutrition Interventions:  Interventions: Tube feeding   Radiology Studies: Dg Abd 1 View  Result Date: 12/08/2018 CLINICAL DATA:  Stroke. Feeding tube placement. History of gastric bypass surgery. EXAM: ABDOMEN - 1 VIEW; DG NASO G TUBE PLC W/FL-NO RAD CONTRAST:  20mL ISOVUE-300 IOPAMIDOL (ISOVUE-300) INJECTION 61% FLUOROSCOPY TIME:  Fluoroscopy Time:  1 minutes, 36 seconds. Radiation Exposure Index (if provided by the fluoroscopic device): 14.5 mGy Number of Acquired Spot Images: 0 COMPARISON:  None. FINDINGS: Under fluoroscopy, a feeding tube was advanced through the gastrojejunal anastomosis into the mid jejunum. Injection of contrast confirmed jejunal placement. Oral contrast is seen within the colon. IMPRESSION: 1. Feeding tube placement in the mid jejunum. Electronically Signed   By: Obie Dredge M.D.   On: 12/08/2018 11:47   Dg Chest Port 1 View  Result Date: 12/08/2018 CLINICAL DATA:  Shortness of breath last evening. EXAM: PORTABLE CHEST 1 VIEW COMPARISON:  12/03/2018 FINDINGS: Enteric tube is identified with tip well below the level of the GE junction. Surgical clips are noted within the left upper  quadrant of the abdomen. Normal heart size. No pleural effusion or edema. Aortic atherosclerosis. No airspace opacities. Coarsened interstitial markings throughout both lungs are again noted and appear unchanged. IMPRESSION: No active disease. Electronically Signed   By: Signa Kell M.D.   On: 12/08/2018 12:24   Dg Vangie Bicker G Tube Plc W/fl-no Rad  Result Date: 12/08/2018 CLINICAL DATA:  Stroke. Feeding tube placement. History of gastric bypass surgery. EXAM: ABDOMEN - 1 VIEW; DG NASO G TUBE PLC W/FL-NO RAD CONTRAST:  20mL ISOVUE-300 IOPAMIDOL (ISOVUE-300) INJECTION 61% FLUOROSCOPY TIME:  Fluoroscopy Time:  1 minutes, 36 seconds. Radiation Exposure Index (if provided by the fluoroscopic device): 14.5 mGy Number of Acquired Spot Images: 0 COMPARISON:  None. FINDINGS: Under fluoroscopy, a feeding tube was advanced through the gastrojejunal anastomosis into the mid jejunum. Injection of contrast confirmed jejunal placement. Oral contrast is seen within the colon. IMPRESSION: 1. Feeding tube placement in the mid jejunum. Electronically Signed   By: Obie Dredge M.D.   On: 12/08/2018 11:47   Scheduled Meds: . [START ON 12/09/2018] aspirin  325 mg Per Tube Daily   Or  . [START ON 12/09/2018] aspirin  300 mg Rectal Daily  . atorvastatin  40 mg Per Tube q1800  . diltiazem  60 mg Per Tube Q6H  . enoxaparin (LOVENOX) injection  40 mg Subcutaneous Q24H  . [START ON 12/09/2018] escitalopram  10 mg Per Tube Daily  . famotidine  40 mg Per Tube BID  . feeding supplement (PRO-STAT SUGAR FREE 64)  30 mL Per Tube Daily  . iopamidol      . iopamidol  100 mL Intravenous Once  . ipratropium  0.5 mg Nebulization Q6H  . levalbuterol  0.63 mg Nebulization Q6H  . metoprolol tartrate  25 mg Per Tube BID  . mirtazapine  45 mg Per Tube QHS  . QUEtiapine  50 mg Per Tube QHS  . sodium chloride flush  10-40 mL Intracatheter Q12H  . venlafaxine  50 mg Per Tube BID   Continuous Infusions: . diltiazem (CARDIZEM) infusion Stopped  (12/06/18 1408)  . feeding supplement (OSMOLITE 1.2 CAL)      LOS: 4  days   Merlene Laughter, DO [facility] PAGER is on AMION  If 7PM-7AM, please contact night-coverage [email or web address] Password [facility] 12/08/2018, 4:33 PM

## 2018-12-08 NOTE — Progress Notes (Signed)
Pt de-sat, O2 at room air sat 84%, RN administered oxygen at 2 liters via nasal canula, O2 sat now 100%, MD notified, D/C IV fluid. Give lasix and  breathing treatment.

## 2018-12-09 DIAGNOSIS — R1312 Dysphagia, oropharyngeal phase: Secondary | ICD-10-CM

## 2018-12-09 DIAGNOSIS — W19XXXA Unspecified fall, initial encounter: Secondary | ICD-10-CM

## 2018-12-09 DIAGNOSIS — R1310 Dysphagia, unspecified: Secondary | ICD-10-CM

## 2018-12-09 LAB — COMPREHENSIVE METABOLIC PANEL
ALBUMIN: 2.9 g/dL — AB (ref 3.5–5.0)
ALT: 11 U/L (ref 0–44)
AST: 16 U/L (ref 15–41)
Alkaline Phosphatase: 46 U/L (ref 38–126)
Anion gap: 10 (ref 5–15)
BUN: 11 mg/dL (ref 8–23)
CO2: 23 mmol/L (ref 22–32)
Calcium: 8.9 mg/dL (ref 8.9–10.3)
Chloride: 107 mmol/L (ref 98–111)
Creatinine, Ser: 0.87 mg/dL (ref 0.44–1.00)
GFR calc Af Amer: >60 mL/min (ref >60)
GFR calc non Af Amer: >60 mL/min (ref >60)
Glucose, Bld: 111 mg/dL — ABNORMAL HIGH (ref 70–99)
Potassium: 3.7 mmol/L (ref 3.5–5.1)
SODIUM: 140 mmol/L (ref 135–145)
Total Bilirubin: 0.5 mg/dL (ref 0.3–1.2)
Total Protein: 5.8 g/dL — ABNORMAL LOW (ref 6.5–8.1)

## 2018-12-09 LAB — CBC WITH DIFFERENTIAL/PLATELET
Abs Immature Granulocytes: 0.02 10*3/uL (ref 0.00–0.07)
Basophils Absolute: 0 10*3/uL (ref 0.0–0.1)
Basophils Relative: 0 %
Eosinophils Absolute: 0.4 10*3/uL (ref 0.0–0.5)
Eosinophils Relative: 7 %
HCT: 33.6 % — ABNORMAL LOW (ref 36.0–46.0)
Hemoglobin: 10.6 g/dL — ABNORMAL LOW (ref 12.0–15.0)
Immature Granulocytes: 0 %
Lymphocytes Relative: 22 %
Lymphs Abs: 1.4 10*3/uL (ref 0.7–4.0)
MCH: 29.3 pg (ref 26.0–34.0)
MCHC: 31.5 g/dL (ref 30.0–36.0)
MCV: 92.8 fL (ref 80.0–100.0)
Monocytes Absolute: 0.9 10*3/uL (ref 0.1–1.0)
Monocytes Relative: 13 %
Neutro Abs: 3.8 10*3/uL (ref 1.7–7.7)
Neutrophils Relative %: 58 %
Platelets: 366 10*3/uL (ref 150–400)
RBC: 3.62 MIL/uL — ABNORMAL LOW (ref 3.87–5.11)
RDW: 14 % (ref 11.5–15.5)
WBC: 6.5 10*3/uL (ref 4.0–10.5)
nRBC: 0 % (ref 0.0–0.2)

## 2018-12-09 LAB — GLUCOSE, CAPILLARY
GLUCOSE-CAPILLARY: 128 mg/dL — AB (ref 70–99)
GLUCOSE-CAPILLARY: 131 mg/dL — AB (ref 70–99)
Glucose-Capillary: 100 mg/dL — ABNORMAL HIGH (ref 70–99)
Glucose-Capillary: 104 mg/dL — ABNORMAL HIGH (ref 70–99)
Glucose-Capillary: 105 mg/dL — ABNORMAL HIGH (ref 70–99)
Glucose-Capillary: 106 mg/dL — ABNORMAL HIGH (ref 70–99)
Glucose-Capillary: 121 mg/dL — ABNORMAL HIGH (ref 70–99)

## 2018-12-09 LAB — MAGNESIUM: Magnesium: 1.6 mg/dL — ABNORMAL LOW (ref 1.7–2.4)

## 2018-12-09 LAB — PHOSPHORUS: Phosphorus: 3.9 mg/dL (ref 2.5–4.6)

## 2018-12-09 MED ORDER — MAGNESIUM SULFATE 4 GM/100ML IV SOLN
4.0000 g | Freq: Once | INTRAVENOUS | Status: AC
  Administered 2018-12-09: 4 g via INTRAVENOUS
  Filled 2018-12-09: qty 100

## 2018-12-09 NOTE — Progress Notes (Signed)
Physical Therapy Treatment Patient Details [name] MRN: [id number] DOB: 02-Nov-1945 Today's Date: 12/09/2018    History of Present Illness Pt is a 73 y.o. female with medical history significant for depression with anxiety, hypertension, atrial fibrillation not anticoagulated due to recurrent GI bleeding, now presenting to the ED from her nursing facility for evaluation of left-sided weakness, dysarthria, and fall. 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 Smaller area of acute infarct right posterior temporal lobe. Pt with recent hospital admission and discharged to SNF on 2/13.     PT Comments    Pt presented in bed with urinary and bowel incontinence. Pt BP upon entry 165/104. Pt participated in rolling x 5 each side to assist with hygiene. Pt required max A +2 for rolling on each side, but was able to better maintain hold to the L side d/t use of RUE on bed rail. Pt transferred via maximove to recliner chair. Pt able to complete therex and was initiating LLE hip musculature to perform adduction. Pt left with call bell/phone, all other needs within reach, and her sister present. Nursing Shaun notified of exit BP of 165/116 and need for catheter replacement. Plan to progress seated activities and progress therex and EOB balance as pt tolerates. D/C plan for SNF is appropriate based on current progress.   Follow Up Recommendations  SNF     Equipment Recommendations  None recommended by PT    Recommendations for Other Services       Precautions / Restrictions Precautions Precautions: Fall Precaution Comments: monitor HR Restrictions Weight Bearing Restrictions: No    Mobility  Bed Mobility Overal bed mobility: Needs Assistance Bed Mobility: Rolling Rolling: Max assist;+2 for physical assistance(able to assist with RUE)         General bed mobility comments: max A +2 rolling to both sides. Pt able to better maintain roll to L side d/t ability to  hold with RUE to bed rail.  Transfers Overall transfer level: Needs assistance Equipment used: Rolling walker (2 wheeled)             General transfer comment: Transfered via Maximove to recliner chair  Ambulation/Gait             General Gait Details: unable at this time   Stairs             Wheelchair Mobility    Modified Rankin (Stroke Patients Only) Modified Rankin (Stroke Patients Only) Pre-Morbid Rankin Score: Moderately severe disability Modified Rankin: Severe disability     Balance                                            Cognition Arousal/Alertness: Lethargic Behavior During Therapy: Flat affect Overall Cognitive Status: Impaired/Different from baseline Area of Impairment: Orientation;Following commands;Memory;Problem solving                     Memory: Decreased short-term memory Following Commands: Follows one step commands with increased time;Follows one step commands inconsistently     Problem Solving: Slow processing;Decreased initiation;Requires verbal cues;Requires tactile cues General Comments: pt A&Ox4 though unaware of most recently being in SNF, difficulty to fully assess as pt lethargic this session and requires cues to remain awake; max multimodal cues to follow one step commands      Exercises General Exercises - Lower Extremity Ankle Circles/Pumps: AROM;PROM;Both;15  reps;Seated(Pt remains flaccid on L side) Long Arc Quad: AROM;PROM;15 reps;Seated;Both(Pt remains flaccid on L side) Hip ABduction/ADduction: AROM;15 reps;Seated;Both(pillow squeeze)    General Comments        Pertinent Vitals/Pain Pain Assessment: No/denies pain    Home Living                      Prior Function            PT Goals (current goals can now be found in the care plan section) Acute Rehab PT Goals PT Goal Formulation: Patient unable to participate in goal setting Potential to Achieve Goals: Fair     Frequency    Min 3X/week      PT Plan Current plan remains appropriate    Co-evaluation              AM-PAC PT "6 Clicks" Mobility   Outcome Measure  Help needed turning from your back to your side while in a flat bed without using bedrails?: Total Help needed moving from lying on your back to sitting on the side of a flat bed without using bedrails?: Total Help needed moving to and from a bed to a chair (including a wheelchair)?: Total Help needed standing up from a chair using your arms (e.g., wheelchair or bedside chair)?: Total Help needed to walk in hospital room?: Total Help needed climbing 3-5 steps with a railing? : Total 6 Click Score: 6    End of Session Equipment Utilized During Treatment: Gait belt Activity Tolerance: Patient limited by lethargy Patient left: in chair;with chair alarm set;with call bell/phone within reach;with family/visitor present Nurse Communication: Mobility status;Other (comment) PT Visit Diagnosis: Other abnormalities of gait and mobility (R26.89);Muscle weakness (generalized) (M62.81);Other symptoms and signs involving the nervous system (R29.898)     Time: 6389-3734 PT Time Calculation (min) (ACUTE ONLY): 33 min  Charges:  $Therapeutic Exercise: 8-22 mins $Therapeutic Activity: 8-22 mins                     Margarita Mail, SPTA   Margarita Mail 12/09/2018, 10:45 AM

## 2018-12-09 NOTE — Care Management Note (Signed)
Case Management Note  Patient Details  [name] MRN: [id number] Date of Birth: [birth year]  Subjective/Objective:     Pt admitted with stroke, pt recently discharged from [facility] with dx of flu, GI bleed and a fib.               Action/Plan:  PTA from [facility] - discharge plan will be back to [facility] place when medically stable   Expected Discharge Date:                  Expected Discharge Plan:  Skilled Nursing Facility(From [facility])  In-House Referral:  Clinical Social Work  Discharge planning Services  CM Consult  Post Acute Care Choice:    Choice offered to:     DME Arranged:    DME Agency:     HH Arranged:    HH Agency:     Status of Service:  In process, will continue to follow  If discussed at Long Length of Stay Meetings, dates discussed:    Additional Comments: 12/09/2018 Pt currently requiring tube feeds - Cortrak placed yesterday.  Pt can not return to SNF with cortrak and pt does not have necessary ICU days for [facility] referral.  CM will continue to follow Cherylann Parr, RN 12/09/2018, 9:08 AM

## 2018-12-09 NOTE — Progress Notes (Signed)
PALLIATIVE:  Patient remains somewhat somnolent but arousable. When awaken and alert she was able to appropriately answer questions. Her sister Adrienne Stone at bedside and patient appropriately identified her, in addition to her name, dob, location, and could discuss some aspects of her care. She denied pain or discomfort. She recently was assisted up in the chair by PT and expresses she is tired.   Support given to sister. She reports she is happy to see patient out of the bed and up in the recliner. She remains hopeful for some improvement and ability to successfully take in pos soon. Sister reports she would like to continue to watchfully wait and see how she does. She feels today is better than yesterday in regards to her alertness at times and being able to cooperate with therapies.   Expressed plans are to continue with Coretrak, see how how she does and if she will be able to safely tolerate po's in the next few days, and watchfully wait for more alertness throughout the day. Sister remains hopeful patient will advance and be able to discharge to SNF with outpatient Palliative but also is prepared for the worst meaning no improvement and possibility of focusing more on comfort care.   All questions answered and support provided to patient and sister, Adrienne Stone.   Assessment: -Somnolent, aroused, A&O x3 when awake, obese, diminished bases, hypertensive, S1S2 a-fib  Plan: -DNR -Watchful waiting with Coretrak and for improvements. -PMT will continue to support.   Total Time: 45 min.   Greater than 50%  of this time was spent counseling and coordinating care related to the above assessment and plan  [name], AGPCNP-BC Palliative Medicine Team  Pager: [phone] Amion: N. Cousar

## 2018-12-09 NOTE — Progress Notes (Signed)
Orthopedic Tech Progress Note Patient Details:  Adrienne Stone [birth year] [id number] Called in brace order Patient ID: [name], female   DOB: [birth year], 73 y.o.   MRN: [id number]   [name] 12/09/2018, 3:42 PM

## 2018-12-09 NOTE — Progress Notes (Signed)
PROGRESS NOTE    Adrienne Stone  TIW:[id number] DOB: [birth year] DOA: 12/03/2018 PCP: [name], PA-C   Brief Narrative:  HPI per Dr. Odie Sera on 12/03/2018 Adrienne Stone a 73 y.o.femalewith medical history significant fordepression with anxiety, hypertension, atrial fibrillation not anticoagulated due to recurrent GI bleeding, now presenting to the emergency department from her nursing facility for evaluation of left-sided weakness, dysarthria, and fall. Patient is accompanied by her sister who assists with the history. Nursing facility personnel reported that the patient fell at approximately 1:30 PM, but had told the patient's sister that she fell at approximately 5 PM. Around the same time, she was noted to have a new speech difficulty and was not moving her left side very well. EMS was called and she was brought into the ED for evaluation of this. Patient's ability to provide a history is limited by her severe dysarthria. Her sister was with her yesterday, she seemed to be in her usual state at that time, and was not complaining of anything.  ED Course:Upon arrival to the ED, patient is found to be afebrile and mildly hypertensive. EKG features atrial fibrillation with PVC. Chest x-ray is notable for cardiomegaly, but no acute finding. Noncontrast head CT reveals hypoattenuation within the right basal ganglia concerning for acute/early subacute CVA, as well as a dense right M1 segment concerning for thrombus. Chemistry panel features a potassium of 5.3 and bilirubin 1.3. CBC is notable for a chronic normocytic anemia. Troponin is undetectable. Patient was given 500 cc normal saline. Neurology was consulted by the ED physician and recommended medical admission for further evaluation and management.   Assessment & Plan:   Principal Problem:   Acute ischemic stroke [facility]) Active Problems:   Anemia   Unspecified atrial fibrillation (HCC)   History of GI  bleed   Depression   Hypertension   Cerebrovascular accident (CVA) (HCC)   Goals of care, counseling/discussion   Palliative care by specialist  Acute Ischemic CVA -Patient presented with acute onset left-sided weakness with slurred speech following a fall at her skilled nursing facility. -History of atrial fibrillation not on chronic anticoagulation secondary to recurrent GI bleeding.  -Imaging significant for an acute right basal ganglia/internal capsule/right posterior temporal lobe ischemic stroke.  -Patient with aphasia, left-sided weakness in both upper and lower extremities, lethargy which is slowly improving.  Patient seems more alert today and following commands. -Hemoglobin A1c 4.9, LDL 83, TTE on 2/3 w/ EF 60-65%, afib, no source of embolism noted -MRA focal right M1 segment flow loss, narrowing right V4 segment -Carotid Doppler: bilateral ICA w/ 1-39% stenosis; right vertebral artery with high resistant flow -PT/OT recommend SNF -Speech therapy recommends alternative means due to dysphasia -coretrack placed.  Aspirin changed to per tube for secondary stroke prophylaxis.  Continue statin.  Discontinue dysphagia 1 diet with pudding until patient is reassessed by speech therapy.  Continue Osmolite.  Permissive hypertension for 5 to 7 days per neurology recommendations.  Palliative care following.   Oropharyngeal Dysphagia -Etiology secondary to acute CVA as above.  -Speech therapy following, failed swallow evaluation, with recommendations of alternative means of nutrition. -MBS 3/1 and per speech therapy passed for Puree Diet with Pudding Thick Liquids but very Somnolent and made NPO.  -IV fluids discontinued.  -Core track reordered and placed.  Speech therapy following.  Patient currently on again Osmolite. -Discontinue current diet. -Speech therapy to reassess.  Hyperlipidemia -LDL 83 with a goal less than 70. HDL 62. -C/w Atorvastatin 40mg  daily  via  Cortrak.  Normocytic Anemia -Hemoglobin at 10.6.  Stable.  -History of GI bleed in the past with last EGD on 11/17/2018 with ulcer at that GJ anastomosis but no visible vessel.  -No signs of active bleeding. -On Aspirin as above for acute stroke, unlikely candidate for chronic anticoagulation due to her recurrent GI bleeds -Continue to Monitor for S/Sx of Bleeding -Repeat and Monitor CBC daily  Acute Hypoxic Respiratory Failure, worsened slightly today -Recent admission for influenza with discharge to SNF on 11/04/2018. No history of lung disease.  -Titrated off of oxygen and saturating 98% on room air today.  -Discontinued IVF now and given IV Lasix 40 mg x1 -Started on Xopenex/Atrovent q6h scheduled -CXR showed "Enteric tube is identified with tip well below the level of the GE junction. Surgical clips are noted within the left upper quadrant of the abdomen. Normal heart size. No pleural effusion or edema. Aortic atherosclerosis. No airspace opacities. Coarsened interstitial markings throughout both lungs are again noted and appear unchanged."  Back Pain -T/L-spine x-rays notable for a chronic L1 compression fracture, otherwise no acute abnormalities. -Continue current pain regimen of Tylenol, Hydrocodone-Acetaminophen 1 tab po q6hprn Moderate pain and IV Morphine 1 mg q4hprn Severe Pain  Depression/Anxiety -Restarted Escitalopram 10 mg po Daily, Mirtazapine 45 mg po qHS, Quetiapine 50 mg po qHS, Lorazepam 0.5 mg po qHS, and Venlafaxine 50 mg po BID and changed so that could go through Cortrak Placement   Essential Hypertension -Home regimen of clonidine 0.1 mg twice daily, Cardizem XR 360 mg daily, lisinopril 5 mg daily, metoprolol 75 mg twice daily.  Permissive hypertension.  Continue current regimen of Cardizem 60 mg per tube every 6 hours, Lopressor 25 mg twice daily per tube.    Persistent Atrial Fibrillation -Patient noted to have persistent/permanent A. fib on EKG.  -Not  on chronic anticoagulation due to recurrent GI bleeds, most recently in February 2020. Etiology of her stroke likely thrombotic event possibly from underlying A. Fib.  -Overnight on 2/29-3/1 heart rate increased and was persistently with A. fib with RVR.  -Continue Aspirin as above -Cardizem gtt now discontinued as Rates improved and now still in A Fib with Rates in 60's -Continue Diltiazem 60 mg q6h via Cortrak Tube -Continue metoprolol tartrate 25 mg q12h via Cortrak Tube -Metoprolol 5 mg IV q6h prn for HR >105 sustained for 5 minutes -Continue to monitor on Telemetry  Ethics:Dr Marland McalpineSheikh discussed with her Sister at bedside again. Patient with significant flaccid paralysis with decreased sensation to her left upper and lower extremity. Unclear if she will ever regain any functional status to this side. She also has significant oral pharyngeal dysphasia from her stroke and SLP evaluated and had placed on Dysphagia 1 Diet with Pudding Thick Liquids but was extremely somnolent so made NPO and will have Cortrak placed.D/C diet.  Patient would not want PEG tube placed. Palliative care following.   Hypomagnesemia -Magnesium at 1.6. Replete.   Morbid Obesity -Estimated body mass index is 42.1 kg/m as calculated from the following:   Height as of this encounter: 5\' 3"  (1.6 m).   Weight as of this encounter: 107.8 kg. -Dietician on board and has made recommendations once core track is placed for Osmolite 1.2 formula at 20 mL's per hour via NG tube and increase by 10 mL's every 4 hours for goal rate of 60 mL's per hour with 30 mL's pro-stat once daily along with free water flushes of 150 mL's 3 times daily per tube  once IV fluids were discontinued    DVT prophylaxis: Lovenox Code Status: DNR Family Communication: Updated sister at bedside. Disposition Plan: To be determined.  Skilled nursing facility with palliative care following versus home with hospice pending progression during the  hospitalization.   Consultants:   Neurology  Palliative care  Procedures:   CT head 12/03/2018, 12/04/2018  Plain films of the T-spine 12/04/2018  Chest x-ray 12/08/2018  Plain films of the L-spine 12/04/2018  MRI MRA head 12/04/2018, 12/05/2018  Carotid ultrasound 12/04/2018  MBS 12/06/2018  Antimicrobials:   None   Subjective: Patient sitting up in chair.  Patient more alert today.  Following commands.  Denies any chest pain or shortness of breath as patient shakes her head.  A phasic.  Objective: Vitals:   12/09/18 0442 12/09/18 0654 12/09/18 0829 12/09/18 1234  BP:  (!) 148/95 (!) 151/99 (!) 149/69  Pulse: 99  (!) 102   Resp: 17  (!) 27   Temp: 98.9 F (37.2 C)  98.6 F (37 C)   TempSrc: Axillary  Oral   SpO2: 97%  95%   Weight:      Height:        Intake/Output Summary (Last 24 hours) at 12/09/2018 1357 Last data filed at 12/09/2018 0354 Gross per 24 hour  Intake 155 ml  Output 1700 ml  Net -1545 ml   Filed Weights   12/03/18 1808 12/03/18 2300 12/09/18 0430  Weight: 107.5 kg 107.8 kg 109.9 kg    Examination:  General exam: Alert.  Coretrack in place. Respiratory system: Clear to auscultation anterior lung fields. Respiratory effort normal. Cardiovascular system: S1 & S2 heard, RRR. No JVD, murmurs, rubs, gallops or clicks. No pedal edema. Gastrointestinal system: Abdomen is nondistended, soft and nontender. No organomegaly or masses felt. Normal bowel sounds heard. Central nervous system: Left facial droop.  Left upper extremity weakness.  Some rightward eye deviation.  Dysarthria.  Left hemineglect.   Extremities: Symmetric 5 x 5 power. Skin: No rashes, lesions or ulcers Psychiatry: Judgement and insight appear poor-fair. Mood & affect flat.     Data Reviewed: I have personally reviewed following labs and imaging studies  CBC: Recent Labs  Lab 12/03/18 1956  12/05/18 0505 12/06/18 0440 12/07/18 0538 12/08/18 0519 12/09/18 0502  WBC 7.5   < >  6.5 5.8 5.1 5.2 6.5  NEUTROABS 5.6  --   --   --   --  2.8 3.8  HGB 10.2*   < > 10.1* 10.0* 9.4* 9.6* 10.6*  HCT 32.7*   < > 31.7* 32.5* 30.7* 30.3* 33.6*  MCV 95.1   < > 92.7 93.7 93.9 93.5 92.8  PLT 319   < > 314 299 309 328 366   < > = values in this interval not displayed.   Basic Metabolic Panel: Recent Labs  Lab 12/05/18 0505 12/06/18 0440 12/07/18 0538 12/08/18 0519 12/09/18 0502  NA 137 137 139 142 140  K 3.9 3.8 3.9 3.6 3.7  CL 107 107 111 112* 107  CO2 GLUCOSE 126* 133* 122* 108* 111*  BUN <5* 7* CREATININE 0.64 0.73 0.74 0.76 0.87  CALCIUM 8.6* 8.6* 8.7* 8.6* 8.9  MG  --  1.6* 1.7 1.9 1.6*  PHOS  --  2.9 3.4 3.5 3.9   GFR: Estimated Creatinine Clearance: 67.5 mL/min (by C-G formula based on SCr of 0.87 mg/dL). Liver Function Tests: Recent Labs  Lab 12/03/18 1956 12/08/18 1610  12/09/18 0502  AST 58* 13* 16  ALT 14 10 11   ALKPHOS 40 45 46  BILITOT 1.3* 0.4 0.5  PROT 6.2* 5.6* 5.8*  ALBUMIN 3.3* 2.7* 2.9*   No results for input(s): LIPASE, AMYLASE in the last 168 hours. No results for input(s): AMMONIA in the last 168 hours. Coagulation Profile: No results for input(s): INR, PROTIME in the last 168 hours. Cardiac Enzymes: Recent Labs  Lab 12/03/18 1956  TROPONINI <0.03   BNP (last 3 results) No results for input(s): PROBNP in the last 8760 hours. HbA1C: No results for input(s): HGBA1C in the last 72 hours. CBG: Recent Labs  Lab 12/08/18 2019 12/09/18 0005 12/09/18 0332 12/09/18 0746 12/09/18 1156  GLUCAP 89 105* 104* 106* 128*   Lipid Profile: No results for input(s): CHOL, HDL, LDLCALC, TRIG, CHOLHDL, LDLDIRECT in the last 72 hours. Thyroid Function Tests: No results for input(s): TSH, T4TOTAL, FREET4, T3FREE, THYROIDAB in the last 72 hours. Anemia Panel: No results for input(s): VITAMINB12, FOLATE, FERRITIN, TIBC, IRON, RETICCTPCT in the last 72 hours. Sepsis Labs: No results for input(s): PROCALCITON,  LATICACIDVEN in the last 168 hours.  No results found for this or any previous visit (from the past 240 hour(s)).       Radiology Studies: Dg Abd 1 View  Result Date: 12/08/2018 CLINICAL DATA:  Stroke. Feeding tube placement. History of gastric bypass surgery. EXAM: ABDOMEN - 1 VIEW; DG NASO G TUBE PLC W/FL-NO RAD CONTRAST:  43mL ISOVUE-300 IOPAMIDOL (ISOVUE-300) INJECTION 61% FLUOROSCOPY TIME:  Fluoroscopy Time:  1 minutes, 36 seconds. Radiation Exposure Index (if provided by the fluoroscopic device): 14.5 mGy Number of Acquired Spot Images: 0 COMPARISON:  None. FINDINGS: Under fluoroscopy, a feeding tube was advanced through the gastrojejunal anastomosis into the mid jejunum. Injection of contrast confirmed jejunal placement. Oral contrast is seen within the colon. IMPRESSION: 1. Feeding tube placement in the mid jejunum. Electronically Signed   By: Obie Dredge M.D.   On: 12/08/2018 11:47   Dg Chest Port 1 View  Result Date: 12/08/2018 CLINICAL DATA:  Shortness of breath last evening. EXAM: PORTABLE CHEST 1 VIEW COMPARISON:  12/03/2018 FINDINGS: Enteric tube is identified with tip well below the level of the GE junction. Surgical clips are noted within the left upper quadrant of the abdomen. Normal heart size. No pleural effusion or edema. Aortic atherosclerosis. No airspace opacities. Coarsened interstitial markings throughout both lungs are again noted and appear unchanged. IMPRESSION: No active disease. Electronically Signed   By: Signa Kell M.D.   On: 12/08/2018 12:24   Dg Vangie Bicker G Tube Plc W/fl-no Rad  Result Date: 12/08/2018 CLINICAL DATA:  Stroke. Feeding tube placement. History of gastric bypass surgery. EXAM: ABDOMEN - 1 VIEW; DG NASO G TUBE PLC W/FL-NO RAD CONTRAST:  63mL ISOVUE-300 IOPAMIDOL (ISOVUE-300) INJECTION 61% FLUOROSCOPY TIME:  Fluoroscopy Time:  1 minutes, 36 seconds. Radiation Exposure Index (if provided by the fluoroscopic device): 14.5 mGy Number of Acquired Spot  Images: 0 COMPARISON:  None. FINDINGS: Under fluoroscopy, a feeding tube was advanced through the gastrojejunal anastomosis into the mid jejunum. Injection of contrast confirmed jejunal placement. Oral contrast is seen within the colon. IMPRESSION: 1. Feeding tube placement in the mid jejunum. Electronically Signed   By: Obie Dredge M.D.   On: 12/08/2018 11:47        Scheduled Meds: . aspirin  325 mg Per Tube Daily   Or  . aspirin  300 mg Rectal Daily  . atorvastatin  40 mg Per  Tube q1800  . diltiazem  60 mg Per Tube Q6H  . enoxaparin (LOVENOX) injection  40 mg Subcutaneous Q24H  . escitalopram  10 mg Per Tube Daily  . famotidine  40 mg Per Tube BID  . feeding supplement (PRO-STAT SUGAR FREE 64)  30 mL Per Tube Daily  . iopamidol  100 mL Intravenous Once  . ipratropium  0.5 mg Nebulization TID  . levalbuterol  0.63 mg Nebulization TID  . metoprolol tartrate  25 mg Per Tube BID  . mirtazapine  45 mg Per Tube QHS  . QUEtiapine  50 mg Per Tube QHS  . sodium chloride flush  10-40 mL Intracatheter Q12H  . venlafaxine  50 mg Per Tube BID   Continuous Infusions: . diltiazem (CARDIZEM) infusion Stopped (12/06/18 1408)  . feeding supplement (OSMOLITE 1.2 CAL) 1,000 mL (12/09/18 0500)  . magnesium sulfate 1 - 4 g bolus IVPB 4 g (12/09/18 1226)     LOS: 5 days    Time spent: 35 minutes    Ramiro Harvest, MD [facility]  If 7PM-7AM, please contact night-coverage [email or web address] 12/09/2018, 1:57 PM

## 2018-12-09 NOTE — Progress Notes (Signed)
PALLIATIVE:  Patient remains somewhat somnolent today. She is arousable and answers questions appropriately. Denied pain or discomfort. Her sister is at the bedside. Patient had Coretrak placed earlier today. Currently not in use.   Spent time supporting sister, who was tearful. She expressed she is fearful patient will not show further signs of improvement and over the past two days have slept more than she has been awake. Sister states patient would not want to live if this is going to be her new quality of life. Support given. She is remaining hopeful and would continue to allow some time to see if she improves or shows any progress. We again discussed temporary trial of Coretrak. Sister is aware patient will need to become more awake and alert to be able to participate with SLP, PT, and OT. We reviewed previous conversations regarding patient's overall functional and nutritional status in consideration to her recent stroke. I openly discussed with sister the high liklihood that patient could not show further signs of improvement or ability to safely tolerate appropriate diet. Adrienne Stone verbalized understanding and again verbalized she will always support her sister's wishes even if she personally felt differently. She report again sister has made it clear no PEG and if she does not do well she is prepared for EOL care. Support given. Goal per sister at this time is to continue with watchful waiting and see how she does over the next 24-48hrs.   Assessment: Somnolent, will arouse, obese, S1S2, irregular, decreased in bases, left facial droop, right nare coretrak, left side hemiparesis  Plan: -DNR/DNI -Continue to treat -Watchful waiting per sister's request for signs of improvement, if patient further declines or shows no positive signs in regards to alertness or diet toleration, may consider a more comfort approach. -PMT will continue to support and follow  Total Time: 45 min.  Greater than 50%   of this time was spent counseling and coordinating care related to the above assessment and plan  Adrienne Stone, AGPCNP-BC Palliative Medicine Team  Pager: [phone] Amion: N. Cousar

## 2018-12-09 NOTE — Progress Notes (Signed)
Occupational Therapy Treatment Patient Details [name] MRN: [id number] DOB: May 11, 1946 Today's Date: 12/09/2018    History of present illness Pt is a 73 y.o. female with medical history significant for depression with anxiety, hypertension, atrial fibrillation not anticoagulated due to recurrent GI bleeding, now presenting to the ED from her nursing facility for evaluation of left-sided weakness, dysarthria, and fall. 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 Smaller area of acute infarct right posterior temporal lobe. Pt with recent hospital admission and discharged to SNF on 2/13.    OT comments  Pt presents seated up in recliner and agreeable to working with OT. Pt with improved ability to track past midline towards L visual field. Pt completing simple grooming task (washing face) with setup assist to wash R side of face and hand over hand assist to locate and wash L side of face. PROM completed in LUE as well. Placed order for LUE resting hand splint to reduce risk for contracture as noted pt's digits tending to rest in flexed position. Will continue per POC.    Follow Up Recommendations  SNF;Supervision/Assistance - 24 hour    Equipment Recommendations  Other (comment)(TBD in next venue)          Precautions / Restrictions Precautions Precautions: Fall Precaution Comments: monitor HR, NG tube Restrictions Weight Bearing Restrictions: No       Mobility Bed Mobility Overal bed mobility: Needs Assistance Bed Mobility: Rolling Rolling: Max assist;+2 for physical assistance(able to assist with RUE)         General bed mobility comments: OOB in recliner upon arrival  Transfers Overall transfer level: Needs assistance Equipment used: Rolling walker (2 wheeled)             General transfer comment: Transfered via [place] to recliner chair    Balance                                           ADL either performed  or assessed with clinical judgement   ADL Overall ADL's : Needs assistance/impaired Eating/Feeding: NPO   Grooming: Wash/dry face;Set up;Sitting;Minimal assistance Grooming Details (indicate cue type and reason): minA (hand over hand) to find and wash L side of face, pt able to wash R side with setup assist only                               General ADL Comments: recommend pt have L resting hand splint for night time use and discussed with RN ordering PRAFO boot for LLE     Vision   Additional Comments: pt demonstrates ability to track past midline towards L visual field today and turning head towards L given max cues to do so; sister present and encouraged her to intermittently have pt attempt to track/look towards L side   Perception     Praxis      Cognition Arousal/Alertness: Lethargic Behavior During Therapy: Flat affect Overall Cognitive Status: Impaired/Different from baseline Area of Impairment: Following commands;Memory;Problem solving                     Memory: Decreased short-term memory Following Commands: Follows one step commands with increased time     Problem Solving: Slow processing;Decreased initiation;Requires verbal cues;Requires tactile cues General Comments: pt increasingly fatigued today after working with  PT up to chair         Exercises Exercises: General Upper Extremity General Exercises - Upper Extremity Shoulder Flexion: PROM;Left;10 reps Shoulder ABduction: PROM;Left;10 reps Shoulder ADduction: PROM;10 reps;Left Elbow Flexion: PROM;Left;10 reps Elbow Extension: PROM;10 reps;Left Wrist Flexion: PROM;10 reps;Left Wrist Extension: PROM;10 reps;Left Digit Composite Flexion: PROM;Left;10 reps Composite Extension: PROM;10 reps;Left General Exercises - Lower Extremity Ankle Circles/Pumps: AROM;PROM;Both;15 reps;Seated(Pt remains flaccid on L side) Long Arc Quad: AROM;PROM;15 reps;Seated;Both(Pt remains flaccid on L  side) Hip ABduction/ADduction: AROM;15 reps;Seated;Both(pillow squeeze)   Shoulder Instructions       General Comments overall VSS today with noted max HR 115    Pertinent Vitals/ Pain       Pain Assessment: Faces Faces Pain Scale: No hurt Pain Intervention(s): Monitored during session  Home Living                                          Prior Functioning/Environment              Frequency  Min 2X/week        Progress Toward Goals  OT Goals(current goals can now be found in the care plan section)  Progress towards OT goals: Progressing toward goals  Acute Rehab OT Goals Patient Stated Goal: none stated today OT Goal Formulation: With patient Time For Goal Achievement: 12/18/18 Potential to Achieve Goals: Fair ADL Goals Pt Will Perform Eating: sitting;with supervision Pt Will Perform Grooming: with supervision;bed level;sitting Pt Will Perform Upper Body Dressing: with min assist;sitting Pt/caregiver will Perform Home Exercise Program: Left upper extremity;Increased ROM;With minimal assist;With Supervision Additional ADL Goal #1: Pt will perform bed mobility with one person modA as precursor to EOB/OOB ADL Additional ADL Goal #2: Pt will attend to L visual field/L side of body with min cues during functional task.  Plan Discharge plan remains appropriate    Co-evaluation                 AM-PAC OT "6 Clicks" Daily Activity     Outcome Measure   Help from another person eating meals?: Total(NPO) Help from another person taking care of personal grooming?: A Lot Help from another person toileting, which includes using toliet, bedpan, or urinal?: Total Help from another person bathing (including washing, rinsing, drying)?: A Lot Help from another person to put on and taking off regular upper body clothing?: Total Help from another person to put on and taking off regular lower body clothing?: Total 6 Click Score: 8    End of Session     OT Visit Diagnosis: Muscle weakness (generalized) (M62.81);Hemiplegia and hemiparesis Hemiplegia - Right/Left: Left Hemiplegia - dominant/non-dominant: Dominant Hemiplegia - caused by: Cerebral infarction   Activity Tolerance Patient limited by lethargy;Patient limited by fatigue;Patient tolerated treatment well   Patient Left in chair;with call bell/phone within reach;with family/visitor present   Nurse Communication Mobility status        Time: 1114-1140 OT Time Calculation (min): 26 min  Charges: OT General Charges $OT Visit: 1 Visit OT Treatments $Self Care/Home Management : 8-22 mins $Therapeutic Activity: 8-22 mins  Marcy Siren, OT Supplemental Rehabilitation Services Pager [phone] Office [phone]    Orlando Penner 12/09/2018, 2:02 PM

## 2018-12-09 NOTE — Progress Notes (Signed)
  Speech Language Pathology Treatment: Dysphagia  Patient Details [name] MRN: [id number] DOB: 14-Jul-1946 Today's Date: 12/09/2018 Time: 4888-9169 SLP Time Calculation (min) (ACUTE ONLY): 10 min  Assessment / Plan / Recommendation Clinical Impression  Pt was seen for dyspahgia treatment to assess improvement in swallow function. She was alert throughout the session and participated well. She tolerated 9/10 halt-tsp boluses of puree solids without overt s/sx of aspiration but exhibited coughing with the initial bolus. An oral/pharyngeal delay is suspected considering time of bolus presentation compared to that of hyolaryngeal elevation. No significant oral residue was noted but multiple swallows were observed, suggesting possible pharyngeal residue. Pt's swallow function appears improved compared to that which was most recently noted by SLP on 12/08/18. SLP will continue to follow pt to assess continued improvement in swallow function and her ability to safely tolerate a p.o. diet.    HPI HPI: 73 y.o. female with new left sided weakness, unknown time of onset. Neurology Exam reveals left facial droop, left hemiplegia, rightward eye deviation, dysarthria and left hemineglect, consistent with a large right MCA infarction. CT head reveals hypoattenuation within the right basal ganglia involving the caudate nucleus, lentiform nucleus, and posterior limb of internal capsule, compatible with acute/early subacute infarction. MRI shows R BG, small right posterior temporal infarcts.      SLP Plan  Continue with current plan of care       Recommendations  Diet recommendations: NPO Medication Administration: Via alternative means                Oral Care Recommendations: Oral care QID Follow up Recommendations: Skilled Nursing facility SLP Visit Diagnosis: Dysphagia, oropharyngeal phase (R13.12) Plan: Continue with current plan of care       Adrienne Stone I. Vear Clock, MS,  CCC-SLP Acute Rehabilitation Services Office number [phone] Pager [phone]               [name] 12/09/2018, 6:12 PM

## 2018-12-10 LAB — CBC
HCT: 33.5 % — ABNORMAL LOW (ref 36.0–46.0)
Hemoglobin: 10.9 g/dL — ABNORMAL LOW (ref 12.0–15.0)
MCH: 29.9 pg (ref 26.0–34.0)
MCHC: 32.5 g/dL (ref 30.0–36.0)
MCV: 91.8 fL (ref 80.0–100.0)
PLATELETS: 375 10*3/uL (ref 150–400)
RBC: 3.65 MIL/uL — ABNORMAL LOW (ref 3.87–5.11)
RDW: 13.8 % (ref 11.5–15.5)
WBC: 7.1 10*3/uL (ref 4.0–10.5)
nRBC: 0 % (ref 0.0–0.2)

## 2018-12-10 LAB — BASIC METABOLIC PANEL
Anion gap: 9 (ref 5–15)
BUN: 20 mg/dL (ref 8–23)
CO2: 25 mmol/L (ref 22–32)
Calcium: 8.8 mg/dL — ABNORMAL LOW (ref 8.9–10.3)
Chloride: 102 mmol/L (ref 98–111)
Creatinine, Ser: 0.85 mg/dL (ref 0.44–1.00)
GFR calc Af Amer: >60 mL/min (ref >60)
GFR calc non Af Amer: >60 mL/min (ref >60)
GLUCOSE: 144 mg/dL — AB (ref 70–99)
Potassium: 3.7 mmol/L (ref 3.5–5.1)
Sodium: 136 mmol/L (ref 135–145)

## 2018-12-10 LAB — GLUCOSE, CAPILLARY
GLUCOSE-CAPILLARY: 111 mg/dL — AB (ref 70–99)
Glucose-Capillary: 135 mg/dL — ABNORMAL HIGH (ref 70–99)
Glucose-Capillary: 138 mg/dL — ABNORMAL HIGH (ref 70–99)
Glucose-Capillary: 124 mg/dL — ABNORMAL HIGH (ref 70–99)
Glucose-Capillary: 100 mg/dL — ABNORMAL HIGH (ref 70–99)

## 2018-12-10 LAB — MAGNESIUM: Magnesium: 2.3 mg/dL (ref 1.7–2.4)

## 2018-12-10 MED ORDER — LEVALBUTEROL HCL 0.63 MG/3ML IN NEBU: 0.6300 mg | INHALATION_SOLUTION | Freq: Three times a day (TID) | RESPIRATORY_TRACT | Status: DC | PRN

## 2018-12-10 MED ORDER — IPRATROPIUM BROMIDE 0.02 % IN SOLN
0.5000 mg | Freq: Two times a day (BID) | RESPIRATORY_TRACT | Status: DC
  Administered 2018-12-11: 0.5 mg via RESPIRATORY_TRACT
  Filled 2018-12-10: qty 2.5

## 2018-12-10 MED ORDER — METOPROLOL TARTRATE 5 MG/5ML IV SOLN
5.0000 mg | Freq: Once | INTRAVENOUS | Status: AC
  Administered 2018-12-10: 5 mg via INTRAVENOUS
  Filled 2018-12-10: qty 5

## 2018-12-10 MED ORDER — LEVALBUTEROL HCL 0.63 MG/3ML IN NEBU
0.6300 mg | INHALATION_SOLUTION | Freq: Two times a day (BID) | RESPIRATORY_TRACT | Status: DC
  Administered 2018-12-11: 0.63 mg via RESPIRATORY_TRACT
  Filled 2018-12-10: qty 3

## 2018-12-10 MED ORDER — WHITE PETROLATUM EX OINT
TOPICAL_OINTMENT | CUTANEOUS | Status: AC
  Administered 2018-12-10: 0.2
  Filled 2018-12-10: qty 28.35

## 2018-12-10 NOTE — Progress Notes (Signed)
PROGRESS NOTE    Adrienne Stone  GGY:[id number] DOB: 23-Jul-1946 DOA: 12/03/2018 PCP: Adrienne Lent, PA-C   Brief Narrative:  HPI per Dr. Odie Sera on 12/03/2018 Adrienne Stone Ballengeris a 73 y.o.femalewith medical history significant fordepression with anxiety, hypertension, atrial fibrillation not anticoagulated due to recurrent GI bleeding, now presenting to the emergency department from her nursing facility for evaluation of left-sided weakness, dysarthria, and fall. Patient is accompanied by her sister who assists with the history. Nursing facility personnel reported that the patient fell at approximately 1:30 PM, but had told the patient's sister that she fell at approximately 5 PM. Around the same time, she was noted to have a new speech difficulty and was not moving her left side very well. EMS was called and she was brought into the ED for evaluation of this. Patient's ability to provide a history is limited by her severe dysarthria. Her sister was with her yesterday, she seemed to be in her usual state at that time, and was not complaining of anything.  ED Course:Upon arrival to the ED, patient is found to be afebrile and mildly hypertensive. EKG features atrial fibrillation with PVC. Chest x-ray is notable for cardiomegaly, but no acute finding. Noncontrast head CT reveals hypoattenuation within the right basal ganglia concerning for acute/early subacute CVA, as well as a dense right M1 segment concerning for thrombus. Chemistry panel features a potassium of 5.3 and bilirubin 1.3. CBC is notable for a chronic normocytic anemia. Troponin is undetectable. Patient was given 500 cc normal saline. Neurology was consulted by the ED physician and recommended medical admission for further evaluation and management.   Assessment & Plan:   Principal Problem:   Acute ischemic stroke [facility]) Active Problems:   Anemia   Unspecified atrial fibrillation (HCC)   History of GI  bleed   Depression   Hypertension   Cerebrovascular accident (CVA) (HCC)   Goals of care, counseling/discussion   Palliative care by specialist   Dysphagia   Oropharyngeal dysphagia  Acute Ischemic CVA -Patient presented with acute onset left-sided weakness with slurred speech following a fall at her skilled nursing facility. -History of atrial fibrillation not on chronic anticoagulation secondary to recurrent GI bleeding.  -Imaging significant for an acute right basal ganglia/internal capsule/right posterior temporal lobe ischemic stroke.  -Patient with aphasia, left-sided weakness in both upper and lower extremities, lethargy which is slowly improving.  Patient seems more alert today and following commands. -Hemoglobin A1c 4.9, LDL 83, TTE on 2/3 w/ EF 60-65%, afib, no source of embolism noted -MRA focal right M1 segment flow loss, narrowing right V4 segment -Carotid Doppler: bilateral ICA w/ 1-39% stenosis; right vertebral artery with high resistant flow -PT/OT recommend SNF -Speech therapy recommends alternative means due to dysphasia -coretrack placed.  Aspirin changed to per tube for secondary stroke prophylaxis.  Continue statin.  Discontinued dysphagia 1 diet with pudding until patient is reassessed by speech therapy.  Continue Osmolite.  Permissive hypertension for 5 to 7 days per neurology recommendations.  Palliative care following.   Oropharyngeal Dysphagia -Etiology secondary to acute CVA as above.  -Speech therapy following, failed swallow evaluation, with recommendations of alternative means of nutrition. -MBS 3/1 and per speech therapy passed for Puree Diet with Pudding Thick Liquids but very Somnolent and made NPO.  -IV fluids discontinued.  -Coretrack reordered and placed.  Speech therapy following.  Patient currently on tube feeds with Osmolite -Speech therapy following.  Hyperlipidemia -LDL 83 with a goal less than 70. HDL 62. -  C/w Atorvastatin  daily via  Cortrak.  Normocytic Anemia -Hemoglobin at 10.9.  Stable.  -History of GI bleed in the past with last EGD on 11/17/2018 with ulcer at that GJ anastomosis but no visible vessel.  -No signs of active bleeding. -On Aspirin as above for acute stroke, unlikely candidate for chronic anticoagulation due to her recurrent GI bleeds -Continue to Monitor for S/Sx of Bleeding -Repeat and Monitor CBC daily  Acute Hypoxic Respiratory Failure, worsened slightly today -Recent admission for influenza with discharge to SNF on 11/04/2018. No history of lung disease.  -Titrated off of oxygen and saturating 99% on room air today.  -Discontinued IVF now and given IV Lasix 40 mg x1 -Continue Xopenex/Atrovent q6h scheduled -CXR showed "Enteric tube is identified with tip well below the level of the GE junction. Surgical clips are noted within the left upper quadrant of the abdomen. Normal heart size. No pleural effusion or edema. Aortic atherosclerosis. No airspace opacities. Coarsened interstitial markings throughout both lungs are again noted and appear unchanged."  Back Pain -T/L-spine x-rays notable for a chronic L1 compression fracture, otherwise no acute abnormalities. -Continue current pain regimen of Tylenol, Hydrocodone-Acetaminophen 1 tab po q6hprn Moderate pain and IV Morphine 1 mg q4hprn Severe Pain  Depression/Anxiety -Restarted Escitalopram 10 mg po Daily, Mirtazapine 45 mg po qHS, Quetiapine 50 mg po qHS, Lorazepam 0.5 mg po qHS, and Venlafaxine 50 mg po BID and changed so that could go through Cortrak Placement   Essential Hypertension -Home regimen of clonidine 0.1 mg twice daily, Cardizem XR 360 mg daily, lisinopril 5 mg daily, metoprolol 75 mg twice daily.  Permissive hypertension.  Continue current regimen of Cardizem 60 mg per tube every 6 hours, Lopressor 25 mg twice daily per tube.    Persistent Atrial Fibrillation -Patient noted to have persistent/permanent A. fib on EKG.  -Not on  chronic anticoagulation due to recurrent GI bleeds, most recently in February 2020. Etiology of her stroke likely thrombotic event possibly from underlying A. Fib.  -Overnight on 2/29-3/1 heart rate increased and was persistently with A. fib with RVR.  -Continue Aspirin as above -Cardizem gtt now discontinued as Rates improved and now still in A Fib with Rates in 60's -Continue Diltiazem 60 mg q6h via Cortrak Tube -Continue metoprolol tartrate 25 mg q12h via Cortrak Tube -Metoprolol 5 mg IV q6h prn for HR >105 sustained for 5 minutes -Continue to monitor on Telemetry  Ethics:Dr Marland Mcalpine discussed with her Sister at bedside again. Patient with significant flaccid paralysis with decreased sensation to her left upper and lower extremity. Unclear if she will ever regain any functional status to this side. She also has significant oral pharyngeal dysphasia from her stroke and SLP evaluated and had placed on Dysphagia 1 Diet with Pudding Thick Liquids but was extremely somnolent so made NPO and will have Cortrak placed.  Patient would not want PEG tube placed. Palliative care following.   Hypomagnesemia -Magnesium at 2.3. Repleted.   Morbid Obesity -Estimated body mass index is 42.1 kg/m as calculated from the following:   Height as of this encounter:  (1.6 m).   Weight as of this encounter: 107.8 kg. -Dietician on board and has made recommendations once core track is placed for Osmolite 1.2 formula at 20 mL's per hour via NG tube and increase by 10 mL's every 4 hours for goal rate of 60 mL's per hour with 30 mL's pro-stat once daily along with free water flushes of 150 mL's 3 times daily  per tube once IV fluids were discontinued    DVT prophylaxis: Lovenox Code Status: DNR Family Communication: Updated sister at bedside. Disposition Plan: To be determined.  Skilled nursing facility with palliative care following versus home with hospice pending progression during the  hospitalization.   Consultants:   Neurology  Palliative care  Procedures:   CT head 12/03/2018, 12/04/2018  Plain films of the T-spine 12/04/2018  Chest x-ray 12/08/2018  Plain films of the L-spine 12/04/2018  MRI MRA head 12/04/2018, 12/05/2018  Carotid ultrasound 12/04/2018  MBS 12/06/2018  Antimicrobials:   None   Subjective: Patient sitting in chair sleeping and snoring.  Per sister at bedside patient was able to communicate with her today and seemed more alert.  Per sister patient just received some pain medication.  Patient snoring heavily and in a deep sleep.  Objective: Vitals:   12/10/18 0500 12/10/18 0505 12/10/18 0838 12/10/18 1203  BP:   (!) 158/98 (!) 144/57  Pulse: 97 98 90 76  Resp: Temp:   98.9 F (37.2 C) 99.1 F (37.3 C)  TempSrc:   Axillary Axillary  SpO2:   97% 98%  Weight: 110.7 kg     Height:        Intake/Output Summary (Last 24 hours) at 12/10/2018 1225 Last data filed at 12/09/2018 1934 Gross per 24 hour  Intake 50 ml  Output 700 ml  Net -650 ml   Filed Weights   12/03/18 2300 12/09/18 0430 12/10/18 0500  Weight: 107.8 kg 109.9 kg 110.7 kg    Examination:  General exam: Sleeping.  Coretrack in place. Respiratory system: Lungs clear to auscultation anterior lung fields.  No wheezes, no crackles, no rhonchi.  Cardiovascular system: Irregularly irregular. No JVD.  No lower extremity edema. Gastrointestinal system: Abdomen is soft, nontender, nondistended, obese, positive bowel sounds. Central nervous system: Left facial droop.  Left upper extremity weakness.  Some rightward eye deviation.  Dysarthria.  Left hemineglect.   Extremities: Symmetric 5 x 5 power. Skin: No rashes, lesions or ulcers Psychiatry: Judgement and insight appear poor-fair. Mood & affect flat.     Data Reviewed: I have personally reviewed following labs and imaging studies  CBC: Recent Labs  Lab 12/03/18 1956  12/06/18 0440 12/07/18 0538  12/08/18 0519 12/09/18 0502 12/10/18 0423  WBC 7.5   < > 5.8 5.1 5.2 6.5 7.1  NEUTROABS 5.6  --   --   --  2.8 3.8  --   HGB 10.2*   < > 10.0* 9.4* 9.6* 10.6* 10.9*  HCT 32.7*   < > 32.5* 30.7* 30.3* 33.6* 33.5*  MCV 95.1   < > 93.7 93.9 93.5 92.8 91.8  PLT 319   < > 299 309 328 366 375   < > = values in this interval not displayed.   Basic Metabolic Panel: Recent Labs  Lab 12/06/18 0440 12/07/18 0538 12/08/18 0519 12/09/18 0502 12/10/18 0423  NA 137 139 142 140 136  K 3.8 3.9 3.6 3.7 3.7  CL 107 111 112* 107 102  CO2 GLUCOSE 133* 122* 108* 111* 144*  BUN 7* CREATININE 0.73 0.74 0.76 0.87 0.85  CALCIUM 8.6* 8.7* 8.6* 8.9 8.8*  MG 1.6* 1.7 1.9 1.6* 2.3  PHOS 2.9 3.4 3.5 3.9  --    GFR: Estimated Creatinine Clearance: 69.4 mL/min (by C-G formula based on SCr of 0.85 mg/dL). Liver Function Tests: Recent Labs  Lab  12/03/18 1956 12/08/18 0519 12/09/18 0502  AST 58* 13* 16  ALT 14 10 11   ALKPHOS 40 45 46  BILITOT 1.3* 0.4 0.5  PROT 6.2* 5.6* 5.8*  ALBUMIN 3.3* 2.7* 2.9*   No results for input(s): LIPASE, AMYLASE in the last 168 hours. No results for input(s): AMMONIA in the last 168 hours. Coagulation Profile: No results for input(s): INR, PROTIME in the last 168 hours. Cardiac Enzymes: Recent Labs  Lab 12/03/18 1956  TROPONINI <0.03   BNP (last 3 results) No results for input(s): PROBNP in the last 8760 hours. HbA1C: No results for input(s): HGBA1C in the last 72 hours. CBG: Recent Labs  Lab 12/09/18 1946 12/09/18 2322 12/10/18 0416 12/10/18 0902 12/10/18 1214  GLUCAP 121* 131* 135* 138* 124*   Lipid Profile: No results for input(s): CHOL, HDL, LDLCALC, TRIG, CHOLHDL, LDLDIRECT in the last 72 hours. Thyroid Function Tests: No results for input(s): TSH, T4TOTAL, FREET4, T3FREE, THYROIDAB in the last 72 hours. Anemia Panel: No results for input(s): VITAMINB12, FOLATE, FERRITIN, TIBC, IRON, RETICCTPCT in the last 72  hours. Sepsis Labs: No results for input(s): PROCALCITON, LATICACIDVEN in the last 168 hours.  No results found for this or any previous visit (from the past 240 hour(s)).       Radiology Studies: No results found.      Scheduled Meds: . aspirin  325 mg Per Tube Daily   Or  . aspirin  300 mg Rectal Daily  . atorvastatin  40 mg Per Tube q1800  . diltiazem  60 mg Per Tube Q6H  . enoxaparin (LOVENOX) injection  40 mg Subcutaneous Q24H  . escitalopram  10 mg Per Tube Daily  . famotidine  40 mg Per Tube BID  . feeding supplement (PRO-STAT SUGAR FREE 64)  30 mL Per Tube Daily  . iopamidol  100 mL Intravenous Once  . ipratropium  0.5 mg Nebulization TID  . levalbuterol  0.63 mg Nebulization TID  . metoprolol tartrate  25 mg Per Tube BID  . mirtazapine  45 mg Per Tube QHS  . QUEtiapine  50 mg Per Tube QHS  . sodium chloride flush  10-40 mL Intracatheter Q12H  . venlafaxine  50 mg Per Tube BID   Continuous Infusions: . diltiazem (CARDIZEM) infusion Stopped (12/06/18 1408)  . feeding supplement (OSMOLITE 1.2 CAL) 1,000 mL (12/10/18 0247)     LOS: 6 days    Time spent: 35 minutes    Ramiro Harvest, MD [facility]  If 7PM-7AM, please contact night-coverage [email or web address] 12/10/2018, 12:25 PM

## 2018-12-10 NOTE — Progress Notes (Signed)
  Speech Language Pathology Treatment:    Patient Details [name] MRN: [id number] DOB: [birth year] Today's Date: 12/10/2018 Time: 8299-3716 SLP Time Calculation (min) (ACUTE ONLY): 30 min  Assessment / Plan / Recommendation Clinical Impression  Pt was seen for dysphagia treatment to assess consistency in swallow function. She was alert throughout the session and expressed, "I'm ready to have some food". She tolerated 20/20 halt-tsp boluses of thin puree (i.e., 4 oz of applesauce) without overt s/sx of aspiration but exhibited coughing with 2/20 boluses of thick puree (i.e., 4 oz pudding). Mild oral residue was noted with thick purees and the possibility of aspiration of oral residue after the swallow is considered. It is recommended that a dysphagia 1 diet be re-initiated at this time with IV fluids for hydration. It is also recommended that the Cortrak not be discontinued as yet until pt has demonstrated tolerance of the p.o. diet. SLP will continue to follow pt to ensure tolerance of the recommended diet.     HPI HPI: 73 y.o. female with new left sided weakness, unknown time of onset. Neurology Exam reveals left facial droop, left hemiplegia, rightward eye deviation, dysarthria and left hemineglect, consistent with a large right MCA infarction. CT head reveals hypoattenuation within the right basal ganglia involving the caudate nucleus, lentiform nucleus, and posterior limb of internal capsule, compatible with acute/early subacute infarction. MRI shows R BG, small right posterior temporal infarcts.      SLP Plan  Continue with current plan of care       Recommendations  Diet recommendations: Dysphagia 1 (puree);Pudding-thick liquid Liquids provided via: Teaspoon Medication Administration: Crushed with puree Supervision: Full supervision/cueing for compensatory strategies Compensations: Slow rate;Small sips/bites;Multiple dry swallows after each bite/sip Postural Changes  and/or Swallow Maneuvers: Seated upright 90 degrees;Upright 30-60 min after meal                Oral Care Recommendations: Oral care BID Follow up Recommendations: Skilled Nursing facility SLP Visit Diagnosis: Dysphagia, oropharyngeal phase (R13.12) Plan: Continue with current plan of care       Husna Krone I. Vear Clock, MS, CCC-SLP Acute Rehabilitation Services Office number [phone] Pager [phone]               [name] 12/10/2018, 5:27 PM

## 2018-12-11 LAB — BASIC METABOLIC PANEL
Anion gap: 8 (ref 5–15)
BUN: 28 mg/dL — ABNORMAL HIGH (ref 8–23)
CHLORIDE: 102 mmol/L (ref 98–111)
CO2: 26 mmol/L (ref 22–32)
Calcium: 9 mg/dL (ref 8.9–10.3)
Creatinine, Ser: 0.87 mg/dL (ref 0.44–1.00)
GFR calc non Af Amer: >60 mL/min (ref >60)
Glucose, Bld: 133 mg/dL — ABNORMAL HIGH (ref 70–99)
Potassium: 4.7 mmol/L (ref 3.5–5.1)
Sodium: 136 mmol/L (ref 135–145)

## 2018-12-11 LAB — GLUCOSE, CAPILLARY
Glucose-Capillary: 115 mg/dL — ABNORMAL HIGH (ref 70–99)
Glucose-Capillary: 130 mg/dL — ABNORMAL HIGH (ref 70–99)
Glucose-Capillary: 131 mg/dL — ABNORMAL HIGH (ref 70–99)
Glucose-Capillary: 132 mg/dL — ABNORMAL HIGH (ref 70–99)
Glucose-Capillary: 139 mg/dL — ABNORMAL HIGH (ref 70–99)

## 2018-12-11 MED ORDER — METOPROLOL TARTRATE 50 MG PO TABS
50.0000 mg | ORAL_TABLET | Freq: Two times a day (BID) | ORAL | Status: DC
  Administered 2018-12-11 – 2018-12-19 (×16): 50 mg
  Filled 2018-12-11 (×17): qty 1

## 2018-12-11 MED ORDER — IPRATROPIUM BROMIDE 0.02 % IN SOLN: 0.5000 mg | Freq: Four times a day (QID) | RESPIRATORY_TRACT | Status: DC | PRN

## 2018-12-11 MED ORDER — LEVALBUTEROL HCL 0.63 MG/3ML IN NEBU: 0.6300 mg | INHALATION_SOLUTION | Freq: Four times a day (QID) | RESPIRATORY_TRACT | Status: DC | PRN

## 2018-12-11 MED ORDER — METOPROLOL TARTRATE 25 MG/10 ML ORAL SUSPENSION
25.0000 mg | Freq: Once | ORAL | Status: AC
  Administered 2018-12-11: 25 mg
  Filled 2018-12-11: qty 10

## 2018-12-11 NOTE — Progress Notes (Signed)
PALLIATIVE:  Patient asleep in bed and snoring, but able to arouse. She was able to answer all questions appropriately once she became more awake. Reported her sister had not been up to visit her yet this morning. Breakfast tray at the bedside, untouched. Denied pain or shortness of breath. Asking if she was going to get up out of the bed today.   No family at the bedside. I spoke with sister. She is feeling more positive that patient is showing signs of improvement. She was able to tolerate some food with SLP on yesterday. Recommendations for patient to re-start dysphagia 1 diet with continued Cortrak until patient demonstrates tolerance to diet. Sister is pleased that patient is more alert and wanting to get out of the bed.   Plans remain to continue with current treatment, with hopes patient will continue to show signs of improvement. Goal is for SNF for rehabilitation and outpatient palliative for support. Unless patient declines or further demonstrates inability to meet caloric requirements without artificial feedings, then plans will shift to a more comfort approach. Sister and patient is remaining hopeful.   1245: Stopped back by to check on patient. She was much more awake and alert. SLP at the bedside feeding patient lunch. She appeared to be tolerating well. Patient greeted me during visit while eating. No immediate concerns noted during her meal intake. I did not observe her coughing after bites during the time I observed. No family at bedside.   Assessment: Sleeping, easily aroused, Alert and oriented to self, place, situation. S1S2, a-fib, diminished bases, obese, left-side hemiplegia, left facial droop, Coretrak in place  Plan: -DNR -Continue to treat and continue with Coretrak with watchful waiting to allow patient time to show improvement and ability to tolerate diet. Goal is for SNF w/rehab and outpatient Palliative. If she declines or is unable to tolerate diet, no PEG tube, will  consider continuing with diet despite aspiration risk with goal more focused on comfort. Sister remains hopeful given patient is more alert, and able to tolerate some food with SLP on 3/5. -PMT will continue to support and follow as needed. Goals are set.     Total Time: 45 min.  Greater than 50%  of this time was spent counseling and coordinating care related to the above assessment and plan  [name], AGPCNP-BC Palliative Medicine Team  Pager: [phone] Amion: N. Cousar

## 2018-12-11 NOTE — Progress Notes (Signed)
PROGRESS NOTE    Adrienne Stone  YBF:[id number] DOB: [birth year] DOA: 12/03/2018 PCP: [name], PA-C   Brief Narrative:  HPI per Dr. Odie Sera on 12/03/2018 Adrienne Drivers Ballengeris a 73 y.o.femalewith medical history significant fordepression with anxiety, hypertension, atrial fibrillation not anticoagulated due to recurrent GI bleeding, now presenting to the emergency department from her nursing facility for evaluation of left-sided weakness, dysarthria, and fall. Patient is accompanied by her sister who assists with the history. Nursing facility personnel reported that the patient fell at approximately 1:30 PM, but had told the patient's sister that she fell at approximately 5 PM. Around the same time, she was noted to have a new speech difficulty and was not moving her left side very well. EMS was called and she was brought into the ED for evaluation of this. Patient's ability to provide a history is limited by her severe dysarthria. Her sister was with her yesterday, she seemed to be in her usual state at that time, and was not complaining of anything.  ED Course:Upon arrival to the ED, patient is found to be afebrile and mildly hypertensive. EKG features atrial fibrillation with PVC. Chest x-ray is notable for cardiomegaly, but no acute finding. Noncontrast head CT reveals hypoattenuation within the right basal ganglia concerning for acute/early subacute CVA, as well as a dense right M1 segment concerning for thrombus. Chemistry panel features a potassium of 5.3 and bilirubin 1.3. CBC is notable for a chronic normocytic anemia. Troponin is undetectable. Patient was given 500 cc normal saline. Neurology was consulted by the ED physician and recommended medical admission for further evaluation and management.   Assessment & Plan:   Principal Problem:   Acute ischemic stroke [facility]) Active Problems:   Anemia   Unspecified atrial fibrillation (HCC)   History of GI  bleed   Depression   Hypertension   Cerebrovascular accident (CVA) (HCC)   Goals of care, counseling/discussion   Palliative care by specialist   Dysphagia   Oropharyngeal dysphagia  Acute Ischemic CVA -Patient presented with acute onset left-sided weakness with slurred speech following a fall at her skilled nursing facility. -History of atrial fibrillation not on chronic anticoagulation secondary to recurrent GI bleeding.  -Imaging significant for an acute right basal ganglia/internal capsule/right posterior temporal lobe ischemic stroke.  -Patient with aphasia, left-sided weakness in both upper and lower extremities, lethargy which is slowly improving.  Patient seems more alert today and following commands. -Hemoglobin A1c 4.9, LDL 83, TTE on 2/3 w/ EF 60-65%, afib, no source of embolism noted -MRA focal right M1 segment flow loss, narrowing right V4 segment -Carotid Doppler: bilateral ICA w/ 1-39% stenosis; right vertebral artery with high resistant flow -PT/OT recommend SNF -Patient seems to be more alert today following commands appropriately. -Speech therapy recommends alternative means due to dysphasia -cortrak placed.  Aspirin changed to per tube for secondary stroke prophylaxis.  Continue statin.  Discontinued dysphagia 1 diet with pudding as patient had become more lethargic.  Patient more alert and following commands and as such as been resumed back on dysphagia 1 diet with pudding thick liquids as recommended per speech therapy.  Continue Osmolite for now. Permissive hypertension for 5 to 7 days per neurology recommendations.  Palliative care following.  Will need outpatient follow-up with neurology.  Oropharyngeal Dysphagia -Etiology secondary to acute CVA as above.  -Speech therapy following, failed swallow evaluation, with recommendations of alternative means of nutrition. -MBS 3/1 and per speech therapy passed for Puree Diet with Pudding Thick  Liquids but very Somnolent  and made NPO.  -IV fluids discontinued.  -Cortrak reordered and placed.  Speech therapy following.  Patient currently on tube feeds with Osmolite.  Patient has been started on dysphagia 1 diet with pudding thick liquids which patient seems to be tolerating.  Patient more alert and following commands.  Keep cortrak in place for now while monitoring patient on diet. -Speech therapy following.  Hyperlipidemia -LDL 83 with a goal less than 70. HDL 62. -Continue atorvastatin 40 mg daily via cortrak.   Normocytic Anemia -Hemoglobin at 10.9.  Stable.  -History of GI bleed in the past with last EGD on 11/17/2018 with ulcer at that GJ anastomosis but no visible vessel.  -No signs of active bleeding. -On Aspirin as above for acute stroke, unlikely candidate for chronic anticoagulation due to her recurrent GI bleeds -Continue to Monitor for S/Sx of Bleeding -Repeat and Monitor CBC daily  Acute Hypoxic Respiratory Failure, worsened slightly today -Recent admission for influenza with discharge to SNF on 11/04/2018. No history of lung disease.  -Titrated off of oxygen and saturating 97% on room air today.  -Discontinued IVF now and given IV Lasix 40 mg x1 -Continue Xopenex/Atrovent q6h scheduled -CXR showed "Enteric tube is identified with tip well below the level of the GE junction. Surgical clips are noted within the left upper quadrant of the abdomen. Normal heart size. No pleural effusion or edema. Aortic atherosclerosis. No airspace opacities. Coarsened interstitial markings throughout both lungs are again noted and appear unchanged."  Back Pain -T/L-spine x-rays notable for a chronic L1 compression fracture, otherwise no acute abnormalities. -Continue current pain regimen of Tylenol, Hydrocodone-Acetaminophen 1 tab po q6hprn Moderate pain and IV Morphine 1 mg q4hprn Severe Pain  Depression/Anxiety -Restarted Escitalopram 10 mg po Daily, Mirtazapine 45 mg po qHS, Quetiapine 50 mg po qHS,  Lorazepam 0.5 mg po qHS, and Venlafaxine 50 mg po BID and changed so that could go through Cortrak Placement   Essential Hypertension -Home regimen of clonidine 0.1 mg twice daily, Cardizem XR 360 mg daily, lisinopril 5 mg daily, metoprolol 75 mg twice daily.  Permissive hypertension.  Continue current regimen of Cardizem 60 mg per tube every 6 hours.  Increase Lopressor to 50 mg twice daily per tube and may uptitrate back to home regimen of 75 twice daily.  Persistent Atrial Fibrillation -Patient noted to have persistent/permanent A. fib on EKG.  -Not on chronic anticoagulation due to recurrent GI bleeds, most recently in February 2020. Etiology of her stroke likely thrombotic event possibly from underlying A. Fib.  -Overnight on 2/29-3/1 heart rate increased and was persistently with A. fib with RVR.  -Continue Aspirin as above -Cardizem gtt now discontinued as Rates improved and now still in A Fib with Rates in 60's -Continue Diltiazem 60 mg q6h via Cortrak Tube -Continue metoprolol tartrate 25 mg q12h via Cortrak Tube -Metoprolol 5 mg IV q6h prn for HR >105 sustained for 5 minutes -Continue to monitor on Telemetry  Ethics:Dr Marland Mcalpine discussed with her Sister at bedside again. Patient with significant flaccid paralysis with decreased sensation to her left upper and lower extremity. Unclear if she will ever regain any functional status to this side. She also has significant oral pharyngeal dysphasia from her stroke and SLP evaluated and had placed on Dysphagia 1 Diet with Pudding Thick Liquids but was extremely somnolent so made NPO and Cortrak placed.  Patient would not want PEG tube placed. Patient now more alert and has been placed back on  a dysphagia 1 diet with pudding thick liquids per speech therapy.  Patient somewhat tolerating diet and being fed.  Palliative care following.   Hypomagnesemia -Magnesium at 2.3. Repleted.   Morbid Obesity -Estimated body mass index is 42.1  kg/m as calculated from the following:   Height as of this encounter: 5\' 3"  (1.6 m).   Weight as of this encounter: 107.8 kg. -Dietician on board and patient currently on Osmolite via cortrak.  See dietitian's recommendations.    DVT prophylaxis: Lovenox Code Status: DNR Family Communication: No family at bedside.  Disposition Plan: To be determined.  Skilled nursing facility with palliative care following versus home with hospice pending progression during the hospitalization.   Consultants:   Neurology  Palliative care  Procedures:   CT head 12/03/2018, 12/04/2018  Plain films of the T-spine 12/04/2018  Chest x-ray 12/08/2018  Plain films of the L-spine 12/04/2018  MRI MRA head 12/04/2018, 12/05/2018  Carotid ultrasound 12/04/2018  MBS 12/06/2018  Antimicrobials:   None   Subjective: Patient sitting up in chair being fed.  Patient seems more alert following commands appropriately.  Denies any shortness of breath.  Denies any chest pain.    Objective: Vitals:   12/11/18 0001 12/11/18 0354 12/11/18 0741 12/11/18 0824  BP: (!) 151/59 90/78 (!) 153/73   Pulse: 97 (!) 119 90   Resp: (!) 22 20 (!) 21   Temp: 98 F (36.7 C) 98.6 F (37 C) 98.8 F (37.1 C)   TempSrc: Axillary Axillary Axillary   SpO2: 96% 100% 97% 98%  Weight:      Height:        Intake/Output Summary (Last 24 hours) at 12/11/2018 1240 Last data filed at 12/10/2018 2151 Gross per 24 hour  Intake 120 ml  Output -  Net 120 ml   Filed Weights   12/03/18 2300 12/09/18 0430 12/10/18 0500  Weight: 107.8 kg 109.9 kg 110.7 kg    Examination:  General exam: NAD.  Cortrack in place. Respiratory system: CTAB.  No wheezes, no crackles, no rhonchi.  Normal respiratory effort. Cardiovascular system: Irregularly irregular. No JVD.  No lower extremity edema. Gastrointestinal system: Abdomen is obese, soft, nontender, nondistended, positive bowel sounds.  Central nervous system: Left facial droop.  Left upper  extremity weakness.  Some rightward eye deviation.  Dysarthria.  Left hemineglect.   Extremities: Symmetric 5 x 5 power. Skin: No rashes, lesions or ulcers Psychiatry: Judgement and insight appear poor-fair. Mood & affect flat.     Data Reviewed: I have personally reviewed following labs and imaging studies  CBC: Recent Labs  Lab 12/06/18 0440 12/07/18 0538 12/08/18 0519 12/09/18 0502 12/10/18 0423  WBC 5.8 5.1 5.2 6.5 7.1  NEUTROABS  --   --  2.8 3.8  --   HGB 10.0* 9.4* 9.6* 10.6* 10.9*  HCT 32.5* 30.7* 30.3* 33.6* 33.5*  MCV 93.7 93.9 93.5 92.8 91.8  PLT 299 309 328 366 375   Basic Metabolic Panel: Recent Labs  Lab 12/06/18 0440 12/07/18 0538 12/08/18 0519 12/09/18 0502 12/10/18 0423 12/11/18 0415  NA 137 139 142 140 136 136  K 3.8 3.9 3.6 3.7 3.7 4.7  CL 107 111 112* 107 102 102  CO2 22 23 26 23 25 26   GLUCOSE 133* 122* 108* 111* 144* 133*  BUN 7* 10 12 11 20  28*  CREATININE 0.73 0.74 0.76 0.87 0.85 0.87  CALCIUM 8.6* 8.7* 8.6* 8.9 8.8* 9.0  MG 1.6* 1.7 1.9 1.6* 2.3  --  PHOS 2.9 3.4 3.5 3.9  --   --    GFR: Estimated Creatinine Clearance: 67.8 mL/min (by C-G formula based on SCr of 0.87 mg/dL). Liver Function Tests: Recent Labs  Lab 12/08/18 0519 12/09/18 0502  AST 13* 16  ALT 10 11  ALKPHOS 45 46  BILITOT 0.4 0.5  PROT 5.6* 5.8*  ALBUMIN 2.7* 2.9*   No results for input(s): LIPASE, AMYLASE in the last 168 hours. No results for input(s): AMMONIA in the last 168 hours. Coagulation Profile: No results for input(s): INR, PROTIME in the last 168 hours. Cardiac Enzymes: No results for input(s): CKTOTAL, CKMB, CKMBINDEX, TROPONINI in the last 168 hours. BNP (last 3 results) No results for input(s): PROBNP in the last 8760 hours. HbA1C: No results for input(s): HGBA1C in the last 72 hours. CBG: Recent Labs  Lab 12/10/18 1959 12/11/18 0012 12/11/18 0358 12/11/18 0741 12/11/18 1227  GLUCAP 100* 130* 139* 131* 132*   Lipid Profile: No results  for input(s): CHOL, HDL, LDLCALC, TRIG, CHOLHDL, LDLDIRECT in the last 72 hours. Thyroid Function Tests: No results for input(s): TSH, T4TOTAL, FREET4, T3FREE, THYROIDAB in the last 72 hours. Anemia Panel: No results for input(s): VITAMINB12, FOLATE, FERRITIN, TIBC, IRON, RETICCTPCT in the last 72 hours. Sepsis Labs: No results for input(s): PROCALCITON, LATICACIDVEN in the last 168 hours.  No results found for this or any previous visit (from the past 240 hour(s)).       Radiology Studies: No results found.      Scheduled Meds: . aspirin  325 mg Per Tube Daily   Or  . aspirin  300 mg Rectal Daily  . atorvastatin  40 mg Per Tube q1800  . diltiazem  60 mg Per Tube Q6H  . enoxaparin (LOVENOX) injection  40 mg Subcutaneous Q24H  . escitalopram  10 mg Per Tube Daily  . famotidine  40 mg Per Tube BID  . feeding supplement (PRO-STAT SUGAR FREE 64)  30 mL Per Tube Daily  . iopamidol  100 mL Intravenous Once  . ipratropium  0.5 mg Nebulization BID  . levalbuterol  0.63 mg Nebulization BID  . metoprolol tartrate  25 mg Per Tube BID  . mirtazapine  45 mg Per Tube QHS  . QUEtiapine  50 mg Per Tube QHS  . sodium chloride flush  10-40 mL Intracatheter Q12H  . venlafaxine  50 mg Per Tube BID   Continuous Infusions: . diltiazem (CARDIZEM) infusion Stopped (12/06/18 1408)  . feeding supplement (OSMOLITE 1.2 CAL) 1,000 mL (12/11/18 0000)     LOS: 7 days    Time spent: 40 minutes    Ramiro Harvest, MD [facility]  If 7PM-7AM, please contact night-coverage [email or web address] 12/11/2018, 12:40 PM

## 2018-12-11 NOTE — Progress Notes (Signed)
Physical Therapy Treatment Patient Details [name] MRN: [id number] DOB: 1946/01/07 Today's Date: 12/11/2018    History of Present Illness Pt is a 73 y.o. female with medical history significant for depression with anxiety, hypertension, atrial fibrillation not anticoagulated due to recurrent GI bleeding, now presenting to the ED from her nursing facility for evaluation of left-sided weakness, dysarthria, and fall. 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 Smaller area of acute infarct right posterior temporal lobe. Pt with recent hospital admission and discharged to SNF on 2/13.   MRI 2/28 shows R BG stroke    PT Comments    Pt was initially lethargic, so time spent with there ex and peri care help wake pt up.  Progressed to EOB for sitting balance with moderate assist and pt with difficulty w/shifting right.  Pt was transferred to a recliner with stability assist given as slowly as possible to give pt time to assist with the right LE.  Pt left in a recliner, positioned comfortably on a lift pad.  Follow Up Recommendations  SNF     Equipment Recommendations  None recommended by PT    Recommendations for Other Services       Precautions / Restrictions Precautions Precautions: Fall    Mobility  Bed Mobility Overal bed mobility: Needs Assistance Bed Mobility: Rolling;Sidelying to Sit Rolling: Max assist;+2 for physical assistance Sidelying to sit: Mod assist;+2 for safety/equipment          Transfers Overall transfer level: Needs assistance   Transfers: Stand Pivot Transfers   Stand pivot transfers: Mod assist;+2 physical assistance;Max assist       General transfer comment: pt assisted well with right LE, but needed cues for sequencing and assist to both boost and come forward.  Ambulation/Gait             General Gait Details: unable at this time   Stairs             Wheelchair Mobility    Modified Rankin (Stroke  Patients Only) Modified Rankin (Stroke Patients Only) Pre-Morbid Rankin Score: Moderately severe disability Modified Rankin: Severe disability     Balance Overall balance assessment: Needs assistance Sitting-balance support: Feet supported;No upper extremity supported Sitting balance-Leahy Scale: Poor Sitting balance - Comments: worked approx. 8 min at EOB on balance.  She has a tendency to list left and have more trouble w/shifting right.  Noted some truncal extension, but general upright sit was in coordinated.     Standing balance-Leahy Scale: Poor Standing balance comment: Reliant on a lot of external support                            Cognition Arousal/Alertness: Lethargic Behavior During Therapy: Flat affect Overall Cognitive Status: Impaired/Different from baseline Area of Impairment: Following commands;Memory;Problem solving                 Orientation Level: Place;Time;Situation   Memory: Decreased short-term memory Following Commands: Follows one step commands with increased time     Problem Solving: Slow processing;Decreased initiation;Requires verbal cues;Requires tactile cues        Exercises Other Exercises Other Exercises: pt completed 8 reps AAROM on the left in hip/knee flexion/ext and right LE side resisted in gross extention and active into flexion.    General Comments        Pertinent Vitals/Pain Pain Assessment: Faces Faces Pain Scale: No hurt Pain Intervention(s): Monitored during session  Home Living                      Prior Function            PT Goals (current goals can now be found in the care plan section) Acute Rehab PT Goals Patient Stated Goal: none stated today PT Goal Formulation: Patient unable to participate in goal setting Time For Goal Achievement: 12/18/18 Potential to Achieve Goals: Fair Progress towards PT goals: Progressing toward goals    Frequency    Min 5X/week      PT Plan  Current plan remains appropriate    Co-evaluation              AM-PAC PT "6 Clicks" Mobility   Outcome Measure  Help needed turning from your back to your side while in a flat bed without using bedrails?: Total Help needed moving from lying on your back to sitting on the side of a flat bed without using bedrails?: Total Help needed moving to and from a bed to a chair (including a wheelchair)?: Total Help needed standing up from a chair using your arms (e.g., wheelchair or bedside chair)?: Total Help needed to walk in hospital room?: Total Help needed climbing 3-5 steps with a railing? : Total 6 Click Score: 6    End of Session Equipment Utilized During Treatment: Gait belt Activity Tolerance: Patient limited by lethargy Patient left: in chair;with chair alarm set;with call bell/phone within reach;with family/visitor present;Other (comment)(onto lift pad.) Nurse Communication: Mobility status PT Visit Diagnosis: Other abnormalities of gait and mobility (R26.89);Muscle weakness (generalized) (M62.81);History of falling (Z91.81)     Time: 1884-1660 PT Time Calculation (min) (ACUTE ONLY): 26 min  Charges:  $Therapeutic Activity: 8-22 mins $Neuromuscular Re-education: 8-22 mins                     12/11/2018  [place] Bing, PT Acute Rehabilitation Services [phone]  (pager) [phone]  (office)   Eliseo Gum Kashara Blocher 12/11/2018, 1:58 PM

## 2018-12-11 NOTE — Care Management Important Message (Signed)
Important Message  Patient Details  [name] MRN: [id number] Date of Birth: [birth year]   Medicare Important Message Given:  Yes    Renie Ora 12/11/2018, 1:40 PM

## 2018-12-11 NOTE — Progress Notes (Signed)
  Speech Language Pathology Treatment: Dysphagia  Patient Details [name] MRN: [id number] DOB: October 19, 1945 Today's Date: 12/11/2018 Time: 1205-1218 SLP Time Calculation (min) (ACUTE ONLY): 13 min  Assessment / Plan / Recommendation Clinical Impression  Pt was seen during lunch for dysphagia treatment to assess tolerance of the recommended diet. She tolerated 1/2 and 3/4 tsp boluses of puree solids without overt s/sx of aspiration and the overall timing of the swallow appeared improved. Oral residue was minimal-mild and cleared with secondary swallows. Dr. Janee Morn was updated regarding the pt's progress and it was agreed that the Cortrak still not be removed until pt demonstrates further consistent tolerance of the diet. SLP will continue to follow pt to ensure continued tolerance of the current diet.   HPI HPI: 73 y.o. female with new left sided weakness, unknown time of onset. Neurology Exam reveals left facial droop, left hemiplegia, rightward eye deviation, dysarthria and left hemineglect, consistent with a large right MCA infarction. CT head reveals hypoattenuation within the right basal ganglia involving the caudate nucleus, lentiform nucleus, and posterior limb of internal capsule, compatible with acute/early subacute infarction. MRI shows R BG, small right posterior temporal infarcts.      SLP Plan  Continue with current plan of care       Recommendations  Diet recommendations: Dysphagia 1 (puree);Pudding-thick liquid Liquids provided via: Teaspoon Medication Administration: Crushed with puree Supervision: Full supervision/cueing for compensatory strategies Compensations: Slow rate;Small sips/bites;Multiple dry swallows after each bite/sip Postural Changes and/or Swallow Maneuvers: Seated upright 90 degrees;Upright 30-60 min after meal                Oral Care Recommendations: Oral care BID Follow up Recommendations: Skilled Nursing facility SLP Visit  Diagnosis: Dysphagia, oropharyngeal phase (R13.12) Plan: Continue with current plan of care       Adrienne Stone I. Vear Clock, MS, CCC-SLP Acute Rehabilitation Services Office number [phone] Pager [phone]               Adrienne Stone 12/11/2018, 1:05 PM

## 2018-12-12 LAB — BASIC METABOLIC PANEL
Anion gap: 7 (ref 5–15)
BUN: 33 mg/dL — ABNORMAL HIGH (ref 8–23)
CO2: 26 mmol/L (ref 22–32)
CREATININE: 0.94 mg/dL (ref 0.44–1.00)
Calcium: 9 mg/dL (ref 8.9–10.3)
Chloride: 105 mmol/L (ref 98–111)
GFR calc Af Amer: >60 mL/min (ref >60)
GFR calc non Af Amer: 60 mL/min — ABNORMAL LOW (ref >60)
Glucose, Bld: 142 mg/dL — ABNORMAL HIGH (ref 70–99)
Potassium: 4.2 mmol/L (ref 3.5–5.1)
Sodium: 138 mmol/L (ref 135–145)

## 2018-12-12 LAB — HEMOGLOBIN AND HEMATOCRIT, BLOOD
HEMATOCRIT: 35.8 % — AB (ref 36.0–46.0)
Hemoglobin: 11.2 g/dL — ABNORMAL LOW (ref 12.0–15.0)

## 2018-12-12 LAB — GLUCOSE, CAPILLARY
GLUCOSE-CAPILLARY: 137 mg/dL — AB (ref 70–99)
Glucose-Capillary: 115 mg/dL — ABNORMAL HIGH (ref 70–99)
Glucose-Capillary: 118 mg/dL — ABNORMAL HIGH (ref 70–99)
Glucose-Capillary: 123 mg/dL — ABNORMAL HIGH (ref 70–99)
Glucose-Capillary: 95 mg/dL (ref 70–99)

## 2018-12-12 NOTE — Progress Notes (Signed)
CSW called and spoke with the patient's sister. Sister chose [facility] [facility] (hard faxed over information, will make a decision on Monday when director is back in the office), [place], [place], and [place] as the last resort.   CSW will give bed offers as they come in and start authorization on Monday.   Drucilla Schmidt, MSW, LCSW-A Clinical Social Worker [employer]

## 2018-12-12 NOTE — Progress Notes (Signed)
PROGRESS NOTE    Adrienne Stone  YBF:[id number] DOB: [birth year] DOA: 12/03/2018 PCP: [name], PA-C   Brief Narrative:  HPI per Dr. Odie Sera on 12/03/2018 Adrienne Stone a 73 y.o.femalewith medical history significant fordepression with anxiety, hypertension, atrial fibrillation not anticoagulated due to recurrent GI bleeding, now presenting to the emergency department from her nursing facility for evaluation of left-sided weakness, dysarthria, and fall. Patient is accompanied by her sister who assists with the history. Nursing facility personnel reported that the patient fell at approximately 1:30 PM, but had told the patient's sister that she fell at approximately 5 PM. Around the same time, she was noted to have a new speech difficulty and was not moving her left side very well. EMS was called and she was brought into the ED for evaluation of this. Patient's ability to provide a history is limited by her severe dysarthria. Her sister was with her yesterday, she seemed to be in her usual state at that time, and was not complaining of anything.  ED Course:Upon arrival to the ED, patient is found to be afebrile and mildly hypertensive. EKG features atrial fibrillation with PVC. Chest x-ray is notable for cardiomegaly, but no acute finding. Noncontrast head CT reveals hypoattenuation within the right basal ganglia concerning for acute/early subacute CVA, as well as a dense right M1 segment concerning for thrombus. Chemistry panel features a potassium of 5.3 and bilirubin 1.3. CBC is notable for a chronic normocytic anemia. Troponin is undetectable. Patient was given 500 cc normal saline. Neurology was consulted by the ED physician and recommended medical admission for further evaluation and management.   Assessment & Plan:   Principal Problem:   Acute ischemic stroke [facility]) Active Problems:   Anemia   Unspecified atrial fibrillation (HCC)   History of GI  bleed   Depression   Hypertension   Cerebrovascular accident (CVA) (HCC)   Goals of care, counseling/discussion   Palliative care by specialist   Dysphagia   Oropharyngeal dysphagia  Acute Ischemic CVA -Patient presented with acute onset left-sided weakness with slurred speech following a fall at her skilled nursing facility. -History of atrial fibrillation not on chronic anticoagulation secondary to recurrent GI bleeding.  -Imaging significant for an acute right basal ganglia/internal capsule/right posterior temporal lobe ischemic stroke.  -Patient with aphasia, left-sided weakness in both upper and lower extremities, lethargy which is slowly improving.  Patient seems more alert today and following commands. -Hemoglobin A1c 4.9, LDL 83, TTE on 2/3 w/ EF 60-65%, afib, no source of embolism noted -MRA focal right M1 segment flow loss, narrowing right V4 segment -Carotid Doppler: bilateral ICA w/ 1-39% stenosis; right vertebral artery with high resistant flow -PT/OT recommend SNF -Patient seems to be more alert today following commands appropriately. -Speech therapy recommends alternative means due to dysphasia -cortrak placed.  Aspirin changed to per tube for secondary stroke prophylaxis.  Continue statin.  Discontinued dysphagia 1 diet with pudding as patient had become more lethargic.  Patient more alert and following commands and as such as been resumed back on dysphagia 1 diet with pudding thick liquids as recommended per speech therapy.  Continue Osmolite for now. Permissive hypertension for 5 to 7 days per neurology recommendations.  Palliative care following.  Will need outpatient follow-up with neurology.  Oropharyngeal Dysphagia -Etiology secondary to acute CVA as above.  -Speech therapy following, failed swallow evaluation, with recommendations of alternative means of nutrition. -MBS 3/1 and per speech therapy passed for Puree Diet with Pudding Thick  Liquids but very Somnolent  and made NPO.  -IV fluids discontinued.  -Cortrak reordered and placed.  Speech therapy following.  Patient currently on tube feeds with Osmolite.  Patient has been started on dysphagia 1 diet with pudding thick liquids which patient seems to be tolerating.  Patient more alert and following commands.  Keep cortrak in place for now while monitoring patient on diet and continue tube feeds with Osmolite for adequate nutrition. -Speech therapy following.  Hyperlipidemia -LDL 83 with a goal less than 70. HDL 62. -Continue atorvastatin 40 mg daily via cortrak.   Normocytic Anemia -Hemoglobin at 11.2.  Stable.  -History of GI bleed in the past with last EGD on 11/17/2018 with ulcer at that GJ anastomosis but no visible vessel.  -No signs of active bleeding. -On Aspirin as above for acute stroke, unlikely candidate for chronic anticoagulation due to her recurrent GI bleeds -Continue to Monitor for S/Sx of Bleeding -Repeat and Monitor CBC daily  Acute Hypoxic Respiratory Failure, worsened slightly today -Recent admission for influenza with discharge to SNF on 11/04/2018. No history of lung disease.  -Titrated off of oxygen and saturating 97% on room air today.  -Discontinued IVF now and given IV Lasix 40 mg x1 -Continue Xopenex/Atrovent q6h scheduled -CXR showed "Enteric tube is identified with tip well below the level of the GE junction. Surgical clips are noted within the left upper quadrant of the abdomen. Normal heart size. No pleural effusion or edema. Aortic atherosclerosis. No airspace opacities. Coarsened interstitial markings throughout both lungs are again noted and appear unchanged."  Back Pain -T/L-spine x-rays notable for a chronic L1 compression fracture, otherwise no acute abnormalities. -Continue current pain regimen of Tylenol, Hydrocodone-Acetaminophen 1 tab po q6hprn Moderate pain and IV Morphine 1 mg q4hprn Severe Pain  Depression/Anxiety -Restarted Escitalopram 10 mg po  Daily, Mirtazapine 45 mg po qHS, Quetiapine 50 mg po qHS, Lorazepam 0.5 mg po qHS, and Venlafaxine 50 mg po BID and changed so that could go through Cortrak Placement   Essential Hypertension -Home regimen of clonidine 0.1 mg twice daily, Cardizem XR 360 mg daily, lisinopril 5 mg daily, metoprolol 75 mg twice daily.  Permissive hypertension.  Continue current regimen of Cardizem 60 mg per tube every 6 hours.  Increased Lopressor to 50 mg twice daily per tube and may uptitrate back to home regimen of 75 twice daily.  Blood pressure improved.  Persistent Atrial Fibrillation -Patient noted to have persistent/permanent A. fib on EKG.  -Not on chronic anticoagulation due to recurrent GI bleeds, most recently in February 2020. Etiology of her stroke likely thrombotic event possibly from underlying A. Fib.  -Overnight on 2/29-3/1 heart rate increased and was persistently with A. fib with RVR.  -Continue Aspirin as above -Cardizem gtt now discontinued as Rates improved and now still in A Fib with Rates in 60's -Continue Diltiazem 60 mg q6h via Cortrak Tube -Increased metoprolol tartrate 50 mg q12h via Cortrak Tube -Metoprolol 5 mg IV q6h prn for HR >105 sustained for 5 minutes -Continue to monitor on Telemetry  Ethics:Dr Marland Mcalpine discussed with her Sister at bedside again. Patient with significant flaccid paralysis with decreased sensation to her left upper and lower extremity. Unclear if she will ever regain any functional status to this side. She also has significant oral pharyngeal dysphasia from her stroke and SLP evaluated and had placed on Dysphagia 1 Diet with Pudding Thick Liquids but was extremely somnolent so made NPO and Cortrak placed.  Patient would not want  PEG tube placed. Patient now more alert and has been placed back on a dysphagia 1 diet with pudding thick liquids per speech therapy.  Patient somewhat tolerating diet and being fed.  Palliative care following.    Hypomagnesemia -Magnesium at 2.3. Repleted.   Morbid Obesity -Estimated body mass index is 42.1 kg/m as calculated from the following:   Height as of this encounter: 5\' 3"  (1.6 m).   Weight as of this encounter: 107.8 kg. -Dietician on board and patient currently on Osmolite via cortrak.  See dietitian's recommendations.    DVT prophylaxis: Lovenox Code Status: DNR Family Communication: Updated sister at bedside.  Disposition Plan: Likely skilled nursing facility with palliative care following.     Consultants:   Neurology  Palliative care  Procedures:   CT head 12/03/2018, 12/04/2018  Plain films of the T-spine 12/04/2018  Chest x-ray 12/08/2018  Plain films of the L-spine 12/04/2018  MRI MRA head 12/04/2018, 12/05/2018  Carotid ultrasound 12/04/2018  MBS 12/06/2018  Antimicrobials:   None   Subjective: Patient sleeping.  Easily arousable.  Following commands.  Denies any chest pain or shortness of breath.  Tolerating current diet per RN.  Sister at bedside.  Objective: Vitals:   12/11/18 2340 12/12/18 0422 12/12/18 0845 12/12/18 1142  BP: (!) 142/66 119/63 124/68 133/84  Pulse: (!) 116 96 99 91  Resp: (!) 26 (!) 25  20  Temp: 98.1 F (36.7 C) 98.9 F (37.2 C) 98.2 F (36.8 C) 98.8 F (37.1 C)  TempSrc: Axillary Axillary Axillary Axillary  SpO2: 95% 96% 99% 98%  Weight:      Height:        Intake/Output Summary (Last 24 hours) at 12/12/2018 1146 Last data filed at 12/12/2018 0424 Gross per 24 hour  Intake -  Output 700 ml  Net -700 ml   Filed Weights   12/03/18 2300 12/09/18 0430 12/10/18 0500  Weight: 107.8 kg 109.9 kg 110.7 kg    Examination:  General exam: NAD.  Cortrak in place. Respiratory system: Lungs clear to auscultation bilaterally anterior lung fields.  Cardiovascular system: Irregularly irregular.  No JVD.  No lower extremity edema.   Gastrointestinal system: Abdomen is soft, obese, nontender, nondistended, positive bowel sounds.   Central nervous system: Left facial droop.  Left upper extremity weakness.  Some rightward eye deviation.  Dysarthria.  Left hemineglect.   Extremities: Symmetric 5 x 5 power. Skin: No rashes, lesions or ulcers Psychiatry: Judgement and insight appear poor-fair. Mood & affect flat.     Data Reviewed: I have personally reviewed following labs and imaging studies  CBC: Recent Labs  Lab 12/06/18 0440 12/07/18 0538 12/08/18 0519 12/09/18 0502 12/10/18 0423 12/12/18 0341  WBC 5.8 5.1 5.2 6.5 7.1  --   NEUTROABS  --   --  2.8 3.8  --   --   HGB 10.0* 9.4* 9.6* 10.6* 10.9* 11.2*  HCT 32.5* 30.7* 30.3* 33.6* 33.5* 35.8*  MCV 93.7 93.9 93.5 92.8 91.8  --   PLT 299 309 328 366 375  --    Basic Metabolic Panel: Recent Labs  Lab 12/06/18 0440 12/07/18 0538 12/08/18 0519 12/09/18 0502 12/10/18 0423 12/11/18 0415 12/12/18 0341  NA 137 139 142 140 136 136 138  K 3.8 3.9 3.6 3.7 3.7 4.7 4.2  CL 107 111 112* 107 102 102 105  CO2 22 23 26 23 25 26 26   GLUCOSE 133* 122* 108* 111* 144* 133* 142*  BUN 7* 10 12 11 20  28*  33*  CREATININE 0.73 0.74 0.76 0.87 0.85 0.87 0.94  CALCIUM 8.6* 8.7* 8.6* 8.9 8.8* 9.0 9.0  MG 1.6* 1.7 1.9 1.6* 2.3  --   --   PHOS 2.9 3.4 3.5 3.9  --   --   --    GFR: Estimated Creatinine Clearance: 62.7 mL/min (by C-G formula based on SCr of 0.94 mg/dL). Liver Function Tests: Recent Labs  Lab 12/08/18 0519 12/09/18 0502  AST 13* 16  ALT 10 11  ALKPHOS 45 46  BILITOT 0.4 0.5  PROT 5.6* 5.8*  ALBUMIN 2.7* 2.9*   No results for input(s): LIPASE, AMYLASE in the last 168 hours. No results for input(s): AMMONIA in the last 168 hours. Coagulation Profile: No results for input(s): INR, PROTIME in the last 168 hours. Cardiac Enzymes: No results for input(s): CKTOTAL, CKMB, CKMBINDEX, TROPONINI in the last 168 hours. BNP (last 3 results) No results for input(s): PROBNP in the last 8760 hours. HbA1C: No results for input(s): HGBA1C in the last 72  hours. CBG: Recent Labs  Lab 12/11/18 2033 12/12/18 0003 12/12/18 0429 12/12/18 0923 12/12/18 1140  GLUCAP 115* 118* 123* 137* 115*   Lipid Profile: No results for input(s): CHOL, HDL, LDLCALC, TRIG, CHOLHDL, LDLDIRECT in the last 72 hours. Thyroid Function Tests: No results for input(s): TSH, T4TOTAL, FREET4, T3FREE, THYROIDAB in the last 72 hours. Anemia Panel: No results for input(s): VITAMINB12, FOLATE, FERRITIN, TIBC, IRON, RETICCTPCT in the last 72 hours. Sepsis Labs: No results for input(s): PROCALCITON, LATICACIDVEN in the last 168 hours.  No results found for this or any previous visit (from the past 240 hour(s)).       Radiology Studies: No results found.      Scheduled Meds: . aspirin  325 mg Per Tube Daily   Or  . aspirin  300 mg Rectal Daily  . atorvastatin  40 mg Per Tube q1800  . diltiazem  60 mg Per Tube Q6H  . enoxaparin (LOVENOX) injection  40 mg Subcutaneous Q24H  . escitalopram  10 mg Per Tube Daily  . famotidine  40 mg Per Tube BID  . feeding supplement (PRO-STAT SUGAR FREE 64)  30 mL Per Tube Daily  . iopamidol  100 mL Intravenous Once  . metoprolol tartrate  50 mg Per Tube BID  . mirtazapine  45 mg Per Tube QHS  . QUEtiapine  50 mg Per Tube QHS  . sodium chloride flush  10-40 mL Intracatheter Q12H  . venlafaxine  50 mg Per Tube BID   Continuous Infusions: . diltiazem (CARDIZEM) infusion Stopped (12/06/18 1408)  . feeding supplement (OSMOLITE 1.2 CAL) 1,000 mL (12/11/18 2033)     LOS: 8 days    Time spent: 40 minutes    Ramiro Harvest, MD [facility]  If 7PM-7AM, please contact night-coverage [email or web address] 12/12/2018, 11:46 AM

## 2018-12-13 LAB — GLUCOSE, CAPILLARY
GLUCOSE-CAPILLARY: 142 mg/dL — AB (ref 70–99)
Glucose-Capillary: 139 mg/dL — ABNORMAL HIGH (ref 70–99)
Glucose-Capillary: 132 mg/dL — ABNORMAL HIGH (ref 70–99)
Glucose-Capillary: 117 mg/dL — ABNORMAL HIGH (ref 70–99)
Glucose-Capillary: 127 mg/dL — ABNORMAL HIGH (ref 70–99)

## 2018-12-13 LAB — HEMOGLOBIN AND HEMATOCRIT, BLOOD
HCT: 33.9 % — ABNORMAL LOW (ref 36.0–46.0)
Hemoglobin: 10.6 g/dL — ABNORMAL LOW (ref 12.0–15.0)

## 2018-12-13 LAB — BASIC METABOLIC PANEL
Anion gap: 9 (ref 5–15)
BUN: 28 mg/dL — ABNORMAL HIGH (ref 8–23)
CO2: 25 mmol/L (ref 22–32)
Calcium: 9.3 mg/dL (ref 8.9–10.3)
Chloride: 105 mmol/L (ref 98–111)
Creatinine, Ser: 0.82 mg/dL (ref 0.44–1.00)
GFR calc Af Amer: >60 mL/min (ref >60)
Glucose, Bld: 109 mg/dL — ABNORMAL HIGH (ref 70–99)
Potassium: 4.3 mmol/L (ref 3.5–5.1)
Sodium: 139 mmol/L (ref 135–145)

## 2018-12-13 NOTE — Progress Notes (Signed)
Pt is coughing when nurse tries to give medication and breakfast. Dr. Janee Morn and speech therapy notified. Nurse will hold off on PO intake for now. Nurse will continue to monitor. Tyron Russell Vira Chaplin

## 2018-12-13 NOTE — Progress Notes (Signed)
PROGRESS NOTE    Adrienne Stone  UJW:[id number] DOB: [birth year] DOA: 12/03/2018 PCP: [name], PA-C   Brief Narrative:  HPI per Dr. Odie Sera on 12/03/2018 Adrienne Stone Ballengeris a 73 y.o.femalewith medical history significant fordepression with anxiety, hypertension, atrial fibrillation not anticoagulated due to recurrent GI bleeding, now presenting to the emergency department from her nursing facility for evaluation of left-sided weakness, dysarthria, and fall. Patient is accompanied by her sister who assists with the history. Nursing facility personnel reported that the patient fell at approximately 1:30 PM, but had told the patient's sister that she fell at approximately 5 PM. Around the same time, she was noted to have a new speech difficulty and was not moving her left side very well. EMS was called and she was brought into the ED for evaluation of this. Patient's ability to provide a history is limited by her severe dysarthria. Her sister was with her yesterday, she seemed to be in her usual state at that time, and was not complaining of anything.  ED Course:Upon arrival to the ED, patient is found to be afebrile and mildly hypertensive. EKG features atrial fibrillation with PVC. Chest x-ray is notable for cardiomegaly, but no acute finding. Noncontrast head CT reveals hypoattenuation within the right basal ganglia concerning for acute/early subacute CVA, as well as a dense right M1 segment concerning for thrombus. Chemistry panel features a potassium of 5.3 and bilirubin 1.3. CBC is notable for a chronic normocytic anemia. Troponin is undetectable. Patient was given 500 cc normal saline. Neurology was consulted by the ED physician and recommended medical admission for further evaluation and management.   Assessment & Plan:   Principal Problem:   Acute ischemic stroke [facility]) Active Problems:   Anemia   Unspecified atrial fibrillation (HCC)   History of GI  bleed   Depression   Hypertension   Cerebrovascular accident (CVA) (HCC)   Goals of care, counseling/discussion   Palliative care by specialist   Dysphagia   Oropharyngeal dysphagia  Acute Ischemic CVA -Patient presented with acute onset left-sided weakness with slurred speech following a fall at her skilled nursing facility. -History of atrial fibrillation not on chronic anticoagulation secondary to recurrent GI bleeding.  -Imaging significant for an acute right basal ganglia/internal capsule/right posterior temporal lobe ischemic stroke.  -Patient with aphasia, left-sided weakness in both upper and lower extremities, lethargy which is slowly improving.  Patient seems more alert today and following commands. -Hemoglobin A1c 4.9, LDL 83, TTE on 2/3 w/ EF 60-65%, afib, no source of embolism noted -MRA focal right M1 segment flow loss, narrowing right V4 segment -Carotid Doppler: bilateral ICA w/ 1-39% stenosis; right vertebral artery with high resistant flow -PT/OT recommend SNF -Patient seems to be more alert today following commands appropriately. -Speech therapy recommends alternative means due to dysphasia -cortrak placed.  Aspirin changed to per tube for secondary stroke prophylaxis.  Continue statin.  Initially discontinued dysphagia 1 diet with pudding as patient had become more lethargic.  Patient more alert and following commands and as such as been resumed back on dysphagia 1 diet with pudding thick liquids as recommended per speech therapy.  Continue Osmolite per cortrak tube for now. Permissive hypertension for 5 to 7 days per neurology recommendations.  Palliative care following.  Will need outpatient follow-up with neurology.  Oropharyngeal Dysphagia -Etiology secondary to acute CVA as above.  -Speech therapy following, failed swallow evaluation, with recommendations of alternative means of nutrition. -MBS 3/1 and per speech therapy passed for Puree  Diet with Pudding Thick  Liquids but very Somnolent and made NPO.  -IV fluids discontinued.  -Cortrak reordered and placed.  Speech therapy following.  Patient currently on tube feeds with Osmolite.  Patient has been started on dysphagia 1 diet with pudding thick liquids which patient seems to be tolerating.  Patient more alert and following commands.  Keep cortrak in place for now while monitoring patient on diet and continue tube feeds with Osmolite for adequate nutrition. -Per RN patient with some coughing while receiving medications this morning. -Speech therapy following.  Hyperlipidemia -LDL 83 with a goal less than 70. HDL 62. -Continue atorvastatin 40 mg daily via cortrak.   Normocytic Anemia -Hemoglobin at 10.6.  Stable.  -History of GI bleed in the past with last EGD on 11/17/2018 with ulcer at that GJ anastomosis but no visible vessel.  -No signs of active bleeding. -On Aspirin as above for acute stroke, unlikely candidate for chronic anticoagulation due to her recurrent GI bleeds -Continue to Monitor for S/Sx of Bleeding -Repeat and Monitor CBC daily  Acute Hypoxic Respiratory Failure, worsened slightly today -Recent admission for influenza with discharge to SNF on 11/04/2018. No history of lung disease.  -Titrated off of oxygen and saturating 97% on room air today.  -Discontinued IVF now and given IV Lasix 40 mg x1 -Continue Xopenex/Atrovent q6h scheduled -CXR showed "Enteric tube is identified with tip well below the level of the GE junction. Surgical clips are noted within the left upper quadrant of the abdomen. Normal heart size. No pleural effusion or edema. Aortic atherosclerosis. No airspace opacities. Coarsened interstitial markings throughout both lungs are again noted and appear unchanged."  Back Pain -T/L-spine x-rays notable for a chronic L1 compression fracture, otherwise no acute abnormalities. -Continue current pain regimen of Tylenol, Hydrocodone-Acetaminophen 1 tab po q6hprn  Moderate pain and IV Morphine 1 mg q4hprn Severe Pain  Depression/Anxiety -Continue escitalopram 10 mg po Daily, Mirtazapine 45 mg po qHS, Quetiapine 50 mg po qHS, Lorazepam 0.5 mg po qHS, and Venlafaxine 50 mg po BID.  Essential Hypertension -Blood pressure improved.  Home regimen of clonidine 0.1 mg twice daily, Cardizem XR 360 mg daily, lisinopril 5 mg daily, metoprolol 75 mg twice daily.  Permissive hypertension initially during earlier part of the hospitalization.  Continue current regimen of Cardizem 60 mg per tube every 6 hours, Lopressor 50 mg twice daily per tube.  Persistent Atrial Fibrillation -Patient noted to have persistent/permanent A. fib on EKG.  -Not on chronic anticoagulation due to recurrent GI bleeds, most recently in February 2020. Etiology of her stroke likely thrombotic event possibly from underlying A. Fib.  -Overnight on 2/29-3/1 heart rate increased and was persistently with A. fib with RVR.  -Continue Aspirin as above -Cardizem gtt now discontinued as Rates improved and now still in A Fib with Rates in 60's -Continue Diltiazem 60 mg q6h via Cortrak Tube -Increased metoprolol tartrate 50 mg q12h via Cortrak Tube -Metoprolol 5 mg IV q6h prn for HR >105 sustained for 5 minutes -Continue to monitor on Telemetry  Ethics:Dr Marland Mcalpine discussed with her Sister at bedside again. Patient with significant flaccid paralysis with decreased sensation to her left upper and lower extremity. Unclear if she will ever regain any functional status to this side. She also has significant oral pharyngeal dysphasia from her stroke and SLP evaluated and had placed on Dysphagia 1 Diet with Pudding Thick Liquids but was extremely somnolent so made NPO and Cortrak placed.  Patient would not want PEG tube  placed. Patient now more alert and has been placed back on a dysphagia 1 diet with pudding thick liquids per speech therapy.  Patient somewhat tolerating diet and being fed.  Palliative  care following.   Hypomagnesemia -Magnesium at 2.3. Repleted.   Morbid Obesity -Estimated body mass index is 42.1 kg/m as calculated from the following:   Height as of this encounter: 5\' 3"  (1.6 m).   Weight as of this encounter: 107.8 kg. -Dietician on board and patient currently on Osmolite via cortrak.  See dietitian's recommendations.    DVT prophylaxis: Lovenox Code Status: DNR Family Communication: No family at bedside.    Disposition Plan: Likely skilled nursing facility with palliative care following once tolerating oral intake adequately versus home with hospice if decompensates or declines.     Consultants:   Neurology  Palliative care  Procedures:   CT head 12/03/2018, 12/04/2018  Plain films of the T-spine 12/04/2018  Chest x-ray 12/08/2018  Plain films of the L-spine 12/04/2018  MRI MRA head 12/04/2018, 12/05/2018  Carotid ultrasound 12/04/2018  MBS 12/06/2018  Antimicrobials:   None   Subjective: Patient sleeping however easily arousable.  Patient denies any chest pain or shortness of breath.  No abdominal pain.  Patient following commands.  Per RN patient with coughing spells when medications were being given this morning.  Objective: Vitals:   12/12/18 2027 12/12/18 2352 12/13/18 0447 12/13/18 0752  BP: 122/67 (!) 136/111 (!) 144/73 (!) 162/66  Pulse: 89 73 83 88  Resp: (!) 26 (!) 27    Temp: 99 F (37.2 C) 98.4 F (36.9 C) 98.2 F (36.8 C) 97.6 F (36.4 C)  TempSrc: Axillary Axillary Axillary Axillary  SpO2: 99% 98% 100% 99%  Weight:      Height:        Intake/Output Summary (Last 24 hours) at 12/13/2018 1147 Last data filed at 12/12/2018 1258 Gross per 24 hour  Intake -  Output 500 ml  Net -500 ml   Filed Weights   12/03/18 2300 12/09/18 0430 12/10/18 0500  Weight: 107.8 kg 109.9 kg 110.7 kg    Examination:  General exam: NAD.  Cortrak in place. Respiratory system: Clear to auscultation bilaterally anterior lung fields.  No  wheezing, no crackles, no rhonchi. Cardiovascular system: Irregularly irregular.  No JVD.  No lower extremity edema.   Gastrointestinal system: Abdomen is obese, soft, nontender, nondistended, positive bowel sounds.  Central nervous system: Left upper extremity weakness with some rightward eye deviation.  Left facial droop.  Dysarthria.  Left hemineglect.  Extremities: Symmetric 5 x 5 power. Skin: No rashes, lesions or ulcers Psychiatry: Judgement and insight appear fair. Mood & affect flat.     Data Reviewed: I have personally reviewed following labs and imaging studies  CBC: Recent Labs  Lab 12/07/18 0538 12/08/18 0519 12/09/18 0502 12/10/18 0423 12/12/18 0341  WBC 5.1 5.2 6.5 7.1  --   NEUTROABS  --  2.8 3.8  --   --   HGB 9.4* 9.6* 10.6* 10.9* 11.2*  HCT 30.7* 30.3* 33.6* 33.5* 35.8*  MCV 93.9 93.5 92.8 91.8  --   PLT 309 328 366 375  --    Basic Metabolic Panel: Recent Labs  Lab 12/07/18 0538 12/08/18 0519 12/09/18 0502 12/10/18 0423 12/11/18 0415 12/12/18 0341  NA 139 142 140 136 136 138  K 3.9 3.6 3.7 3.7 4.7 4.2  CL 111 112* 107 102 102 105  CO2 23 26 23 25 26 26   GLUCOSE 122* 108* 111*  144* 133* 142*  BUN 10 12 11 20  28* 33*  CREATININE 0.74 0.76 0.87 0.85 0.87 0.94  CALCIUM 8.7* 8.6* 8.9 8.8* 9.0 9.0  MG 1.7 1.9 1.6* 2.3  --   --   PHOS 3.4 3.5 3.9  --   --   --    GFR: Estimated Creatinine Clearance: 62.7 mL/min (by C-G formula based on SCr of 0.94 mg/dL). Liver Function Tests: Recent Labs  Lab 12/08/18 0519 12/09/18 0502  AST 13* 16  ALT 10 11  ALKPHOS 45 46  BILITOT 0.4 0.5  PROT 5.6* 5.8*  ALBUMIN 2.7* 2.9*   No results for input(s): LIPASE, AMYLASE in the last 168 hours. No results for input(s): AMMONIA in the last 168 hours. Coagulation Profile: No results for input(s): INR, PROTIME in the last 168 hours. Cardiac Enzymes: No results for input(s): CKTOTAL, CKMB, CKMBINDEX, TROPONINI in the last 168 hours. BNP (last 3 results) No  results for input(s): PROBNP in the last 8760 hours. HbA1C: No results for input(s): HGBA1C in the last 72 hours. CBG: Recent Labs  Lab 12/12/18 1140 12/12/18 1808 12/13/18 0132 12/13/18 0403 12/13/18 0833  GLUCAP 115* 95 139* 132* 142*   Lipid Profile: No results for input(s): CHOL, HDL, LDLCALC, TRIG, CHOLHDL, LDLDIRECT in the last 72 hours. Thyroid Function Tests: No results for input(s): TSH, T4TOTAL, FREET4, T3FREE, THYROIDAB in the last 72 hours. Anemia Panel: No results for input(s): VITAMINB12, FOLATE, FERRITIN, TIBC, IRON, RETICCTPCT in the last 72 hours. Sepsis Labs: No results for input(s): PROCALCITON, LATICACIDVEN in the last 168 hours.  No results found for this or any previous visit (from the past 240 hour(s)).       Radiology Studies: No results found.      Scheduled Meds: . aspirin  325 mg Per Tube Daily   Or  . aspirin  300 mg Rectal Daily  . atorvastatin  40 mg Per Tube q1800  . diltiazem  60 mg Per Tube Q6H  . enoxaparin (LOVENOX) injection  40 mg Subcutaneous Q24H  . escitalopram  10 mg Per Tube Daily  . famotidine  40 mg Per Tube BID  . feeding supplement (PRO-STAT SUGAR FREE 64)  30 mL Per Tube Daily  . iopamidol  100 mL Intravenous Once  . metoprolol tartrate  50 mg Per Tube BID  . mirtazapine  45 mg Per Tube QHS  . QUEtiapine  50 mg Per Tube QHS  . sodium chloride flush  10-40 mL Intracatheter Q12H  . venlafaxine  50 mg Per Tube BID   Continuous Infusions: . diltiazem (CARDIZEM) infusion Stopped (12/06/18 1408)  . feeding supplement (OSMOLITE 1.2 CAL) 1,000 mL (12/12/18 1758)     LOS: 9 days    Time spent: 35 minutes    Ramiro Harvest, MD [facility]  If 7PM-7AM, please contact night-coverage [email or web address] 12/13/2018, 11:47 AM

## 2018-12-13 NOTE — Progress Notes (Addendum)
  Speech Language Pathology Treatment: Dysphagia  Patient Details [name] MRN: [id number] DOB: [birth year] Today's Date: 12/13/2018 Time: 4536-4680 SLP Time Calculation (min) (ACUTE ONLY): 15 min  Assessment / Plan / Recommendation Clinical Impression  Pt was seen per request of Adelina Mings, RN due to reports of the pt not tolerating the diet as well as she did on 12/12/18 or 12/11/18. Per Adelina Mings, pt demonstrated coughing with all three boluses of crushed medications in applesauce despite her observance of all swallowing precautions and the pt also demonstrated coughing during lunch when she was fed by her sister. Pt tolerated 12/15 boluses of puree solids with this SLP without overt s/sx of aspiration but demonstrated coughing with the other three boluses. An oral/pharyngeal delay is still suspected considering time of bolus presentation vs that of hyolaryngeal elevation and mild oral residue was noted but cleared with dry secondary swallows. Considering the pt's inconsistency in tolerance of the recommended diet despite observance of swallowing precautions and no there being no significant change in alertness, the need for long-term non-oral alimentation is questioned. However, a repeat modified barium swallow study may be beneficial to re-assess the functional integrity of the swallow mechanism and determine whether there has been any underlying change. SLP will continue to follow.    HPI HPI: 73 y.o. female with new left sided weakness, unknown time of onset. Neurology Exam reveals left facial droop, left hemiplegia, rightward eye deviation, dysarthria and left hemineglect, consistent with a large right MCA infarction. CT head reveals hypoattenuation within the right basal ganglia involving the caudate nucleus, lentiform nucleus, and posterior limb of internal capsule, compatible with acute/early subacute infarction. MRI shows R BG, small right posterior temporal infarcts.      SLP Plan  Continue with current plan of care       Recommendations  Diet recommendations: Dysphagia 1 (puree);Pudding-thick liquid Liquids provided via: Teaspoon Medication Administration: Crushed with puree Supervision: Full supervision/cueing for compensatory strategies Compensations: Slow rate;Small sips/bites;Multiple dry swallows after each bite/sip Postural Changes and/or Swallow Maneuvers: Seated upright 90 degrees;Upright 30-60 min after meal                Oral Care Recommendations: Oral care BID Follow up Recommendations: Skilled Nursing facility SLP Visit Diagnosis: Dysphagia, oropharyngeal phase (R13.12) Plan: Continue with current plan of care       Boubacar Lerette I. Vear Clock, MS, CCC-SLP Acute Rehabilitation Services Office number [phone] Pager [phone]               [name] 12/13/2018, 6:43 PM

## 2018-12-14 ENCOUNTER — Inpatient Hospital Stay (HOSPITAL_COMMUNITY): Payer: Medicare HMO

## 2018-12-14 LAB — GLUCOSE, CAPILLARY
GLUCOSE-CAPILLARY: 107 mg/dL — AB (ref 70–99)
Glucose-Capillary: 128 mg/dL — ABNORMAL HIGH (ref 70–99)
Glucose-Capillary: 122 mg/dL — ABNORMAL HIGH (ref 70–99)
Glucose-Capillary: 128 mg/dL — ABNORMAL HIGH (ref 70–99)
Glucose-Capillary: 106 mg/dL — ABNORMAL HIGH (ref 70–99)

## 2018-12-14 MED ORDER — ENSURE ENLIVE PO LIQD
237.0000 mL | Freq: Every day | ORAL | Status: DC
  Administered 2018-12-14 – 2018-12-20 (×4): 237 mL via ORAL

## 2018-12-14 NOTE — Progress Notes (Signed)
Nutrition Follow-up  DOCUMENTATION CODES:   Morbid obesity  INTERVENTION:  Continue Osmolite 1.2 formula via Cortrak NGT at goal rate of 60 ml/hr.   30 ml Prostat once daily.    Tube feeding regimen provides 1828 kcal (100% of needs), 95 grams of protein, and 1181 ml of H2O.   Provide Ensure Enlive po once daily, each supplement provides 350 kcal and 20 grams of protein  NUTRITION DIAGNOSIS:   Inadequate oral intake related to acute illness, dysphagia as evidenced by NPO status; ongoing  GOAL:   Patient will meet greater than or equal to 90% of their needs; met with TF  MONITOR:   PO intake, Supplement acceptance, Diet advancement, Weight trends, TF tolerance, Skin, Labs, I & O's  REASON FOR ASSESSMENT:   Consult Enteral/tube feeding initiation and management  ASSESSMENT:    73 yo female admitted with acute ischemic CVA with left-sided weakness, dysarthria and dysphagia. PMH includes depression/anxiety, HTN  Pt underwent MBS today, diet modified to a dysphagia 1 diet with thin liquids. Pt lethargic during time of visit and unable to respond to RD interview. No family at bedside. Pt has been tolerating her tube feeds well with no difficulties per RN. RN reports po intake has been poor due to poor cognition. RD to order Ensure to aid in po intake. RN to offer nutritional supplement as appropriate. Will continue with enteral nutrition via Cortrak NGT as po intake has been inadequate. Labs and medications reviewed.   Diet Order:   Diet Order            DIET - DYS 1 Room service appropriate? Yes; Fluid consistency: Thin  Diet effective now              EDUCATION NEEDS:   Not appropriate for education at this time  Skin:  Skin Assessment: Reviewed RN Assessment  Last BM:  3/8  Height:   Ht Readings from Last 1 Encounters:  12/03/18 '5\' 3"'  (1.6 m)    Weight:   Wt Readings from Last 1 Encounters:  12/10/18 110.7 kg    Ideal Body Weight:  52.3 kg  BMI:   Body mass index is 43.23 kg/m.  Estimated Nutritional Needs:   Kcal:  1800-2000 kcals   Protein:  90-100 g   Fluid:  >/= 1.8 L    Corrin Parker, MS, RD, LDN Pager # [phone] After hours/ weekend pager # [phone]

## 2018-12-14 NOTE — Progress Notes (Signed)
PROGRESS NOTE    [name]  MBW:[id number] DOB: [birth year] DOA: 12/03/2018 PCP: [name], PA-C   Brief Narrative:  HPI per Dr. Odie Sera on 12/03/2018 Adrienne Drivers Ballengeris a 73 y.o.femalewith medical history significant fordepression with anxiety, hypertension, atrial fibrillation not anticoagulated due to recurrent GI bleeding, now presenting to the emergency department from her nursing facility for evaluation of left-sided weakness, dysarthria, and fall. Patient is accompanied by her sister who assists with the history. Nursing facility personnel reported that the patient fell at approximately 1:30 PM, but had told the patient's sister that she fell at approximately 5 PM. Around the same time, she was noted to have a new speech difficulty and was not moving her left side very well. EMS was called and she was brought into the ED for evaluation of this. Patient's ability to provide a history is limited by her severe dysarthria. Her sister was with her yesterday, she seemed to be in her usual state at that time, and was not complaining of anything.  ED Course:Upon arrival to the ED, patient is found to be afebrile and mildly hypertensive. EKG features atrial fibrillation with PVC. Chest x-ray is notable for cardiomegaly, but no acute finding. Noncontrast head CT reveals hypoattenuation within the right basal ganglia concerning for acute/early subacute CVA, as well as a dense right M1 segment concerning for thrombus. Chemistry panel features a potassium of 5.3 and bilirubin 1.3. CBC is notable for a chronic normocytic anemia. Troponin is undetectable. Patient was given 500 cc normal saline. Neurology was consulted by the ED physician and recommended medical admission for further evaluation and management.   Assessment & Plan:   Principal Problem:   Acute ischemic stroke [facility] [address]) Active Problems:   Anemia   Unspecified atrial fibrillation (HCC)   History of GI  bleed   Depression   Hypertension   Cerebrovascular accident (CVA) (HCC)   Goals of care, counseling/discussion   Palliative care by specialist   Dysphagia   Oropharyngeal dysphagia  Acute Ischemic CVA -Patient presented with acute onset left-sided weakness with slurred speech following a fall at her skilled nursing facility. -History of atrial fibrillation not on chronic anticoagulation secondary to recurrent GI bleeding.  -Imaging significant for an acute right basal ganglia/internal capsule/right posterior temporal lobe ischemic stroke.  -Patient with aphasia, left-sided weakness in both upper and lower extremities, lethargy which is slowly improving.  Patient seems more lethargic today however easily arousable and following commands.  -Hemoglobin A1c 4.9, LDL 83, TTE on 2/3 w/ EF 60-65%, afib, no source of embolism noted -MRA focal right M1 segment flow loss, narrowing right V4 segment -Carotid Doppler: bilateral ICA w/ 1-39% stenosis; right vertebral artery with high resistant flow -PT/OT recommend SNF -Patient seems to be more lethargic today however easily arousable and following commands appropriately. -Speech therapy recommends alternative means due to dysphasia -cortrak placed.  Aspirin changed to per tube for secondary stroke prophylaxis.  Continue statin.  Initially discontinued dysphagia 1 diet with pudding as patient had become more lethargic.  Patient was more alert and following commands over the past few days, and as such as been resumed back on dysphagia 1 diet with pudding thick liquids as recommended per speech therapy.  Continue Osmolite per cortrak tube for now. Permissive hypertension for 5 to 7 days per neurology recommendations.  Palliative care following.  Will need outpatient follow-up with neurology.  Oropharyngeal Dysphagia -Etiology secondary to acute CVA as above.  -Speech therapy following, failed swallow evaluation, with recommendations  of alternative means  of nutrition. -MBS 3/1 and per speech therapy passed for Puree Diet with Pudding Thick Liquids but very Somnolent and made NPO.  -IV fluids discontinued.  -Cortrak reordered and placed.  Speech therapy following.  Patient currently on tube feeds with Osmolite.  Patient has been started on dysphagia 1 diet with pudding thick liquids which patient seems to be tolerating.  Patient more alert and following commands.  Keep cortrak in place for now while monitoring patient on diet and continue tube feeds with Osmolite for adequate nutrition. -Per RN patient with some coughing while receiving medications yesterday morning and this morning.  Patient has been seen by speech therapy and patient underwent modified barium swallow.  Patient on a dysphagia 1 diet with pudding thick liquids.  See speech therapy recommendations.  Hyperlipidemia -LDL 83 with a goal less than 70. HDL 62. -Continue current regimen of atorvastatin 40 mg daily via cortrak.   Normocytic Anemia -Hemoglobin at 10.6.  Stable.  -History of GI bleed in the past with last EGD on 11/17/2018 with ulcer at that GJ anastomosis but no visible vessel.  -No signs of active bleeding. -On Aspirin as above for acute stroke, unlikely candidate for chronic anticoagulation due to her recurrent GI bleeds -Continue to Monitor for S/Sx of Bleeding -Repeat and Monitor CBC daily  Acute Hypoxic Respiratory Failure, worsened slightly today -Recent admission for influenza with discharge to SNF on 11/04/2018. No history of lung disease.  -Titrated off of oxygen and saturating 97% on room air today.  -Discontinued IVF now and given IV Lasix 40 mg x1 -Continue Xopenex/Atrovent q6h scheduled -CXR showed "Enteric tube is identified with tip well below the level of the GE junction. Surgical clips are noted within the left upper quadrant of the abdomen. Normal heart size. No pleural effusion or edema. Aortic atherosclerosis. No airspace opacities. Coarsened  interstitial markings throughout both lungs are again noted and appear unchanged."  Back Pain -T/L-spine x-rays notable for a chronic L1 compression fracture, otherwise no acute abnormalities. -Continue current pain regimen of Tylenol, Hydrocodone-Acetaminophen 1 tab po q6hprn Moderate pain and IV Morphine 1 mg q4hprn Severe Pain  Depression/Anxiety -Continue escitalopram 10 mg po Daily, Mirtazapine 45 mg po qHS, Quetiapine 50 mg po qHS, Lorazepam 0.5 mg po qHS, and Venlafaxine 50 mg po BID.  Essential Hypertension -Blood pressure improved.  Home regimen was clonidine 0.1 mg twice daily, Cardizem XR 360 mg daily, lisinopril 5 mg daily, metoprolol 75 mg twice daily.  Permissive hypertension initially during earlier part of the hospitalization.  Continue current regimen of Cardizem 60 mg per tube every 6 hours, Lopressor 50 mg twice daily per tube.  Persistent Atrial Fibrillation -Patient noted to have persistent/permanent A. fib on EKG.  -Not on chronic anticoagulation due to recurrent GI bleeds, most recently in February 2020. Etiology of her stroke likely thrombotic event possibly from underlying A. Fib.  -Overnight on 2/29-3/1 heart rate increased and was persistently with A. fib with RVR.  -Continue Aspirin as above -Cardizem gtt now discontinued as Rates improved and now still in A Fib with Rates in 60's -Continue Diltiazem 60 mg q6h via Cortrak Tube -Increased metoprolol tartrate 50 mg q12h via Cortrak Tube -Metoprolol 5 mg IV q6h prn for HR >105 sustained for 5 minutes -Continue to monitor on Telemetry  Ethics:Dr Marland Mcalpine discussed with her Sister at bedside again. Patient with significant flaccid paralysis with decreased sensation to her left upper and lower extremity. Unclear if she will ever regain  any functional status to this side. She also has significant oral pharyngeal dysphasia from her stroke and SLP evaluated and had placed on Dysphagia 1 Diet with Pudding Thick  Liquids but was extremely somnolent so made NPO and Cortrak placed.  Patient would not want PEG tube placed. Patient now more alert and has been placed back on a dysphagia 1 diet with pudding thick liquids per speech therapy.  Patient somewhat tolerating diet and being fed.  Palliative care following.   Hypomagnesemia -Magnesium at 2.3. Repleted.   Morbid Obesity -Estimated body mass index is 42.1 kg/m as calculated from the following:   Height as of this encounter:  (1.6 m).   Weight as of this encounter: 107.8 kg. -Dietician on board and patient currently on Osmolite via cortrak.  See dietitian's recommendations.  Abdominal pain Patient with complaints of deep abdominal pain.  On physical exam patient does not seem tender to palpation.  Patient having bowel movements.  Check a bladder scan and a bladder scan with greater than 350 cc.  Check abdominal films.  Follow.    DVT prophylaxis: Lovenox Code Status: DNR Family Communication: No family at bedside.    Disposition Plan: Likely skilled nursing facility with palliative care following once tolerating oral intake adequately versus home with hospice if decompensates or declines.     Consultants:   Neurology  Palliative care  Procedures:   CT head 12/03/2018, 12/04/2018  Plain films of the T-spine 12/04/2018  Chest x-ray 12/08/2018  Plain films of the L-spine 12/04/2018  MRI MRA head 12/04/2018, 12/05/2018  Carotid ultrasound 12/04/2018  MBS 12/06/2018  Antimicrobials:   None   Subjective: Patient sleeping however easily arousable.  Somewhat lethargic.  Complaining of deep abdominal pain.  Denies any chest pain or shortness of breath.  No abdominal pain.   Objective: Vitals:   12/14/18 0415 12/14/18 0609 12/14/18 0900 12/14/18 1120  BP: (!) 156/73  124/76 132/76  Pulse: 97 (!) 104 92 97  Resp: 18 (!) 25 13   Temp: 98.6 F (37 C)  97.8 F (36.6 C)   TempSrc: Oral  Axillary   SpO2: 97% 96% 96%   Weight:       Height:        Intake/Output Summary (Last 24 hours) at 12/14/2018 1203 Last data filed at 12/14/2018 1100 Gross per 24 hour  Intake 969 ml  Output -  Net 969 ml   Filed Weights   12/03/18 2300 12/09/18 0430 12/10/18 0500  Weight: 107.8 kg 109.9 kg 110.7 kg    Examination:  General exam: NAD.  Cortrak in place. Respiratory system: Lungs clear to auscultation bilaterally anterior lung fields.  No wheezes, no crackles, no rhonchi.  Cardiovascular system: Irregularly irregular.  No JVD.  No lower extremity edema.   Gastrointestinal system: Abdomen is soft, obese, nontender, nondistended, positive bowel sounds.  No rebound.  No guarding.  Central nervous system: Left upper extremity weakness with some rightward eye deviation.  Left facial droop.  Dysarthria.  Left hemineglect.  Extremities: Symmetric 5 x 5 power. Skin: No rashes, lesions or ulcers Psychiatry: Judgement and insight appear fair. Mood & affect flat.     Data Reviewed: I have personally reviewed following labs and imaging studies  CBC: Recent Labs  Lab 12/08/18 0519 12/09/18 0502 12/10/18 0423 12/12/18 0341 12/13/18 1540  WBC 5.2 6.5 7.1  --   --   NEUTROABS 2.8 3.8  --   --   --   HGB 9.6* 10.6*  10.9* 11.2* 10.6*  HCT 30.3* 33.6* 33.5* 35.8* 33.9*  MCV 93.5 92.8 91.8  --   --   PLT 328 366 375  --   --    Basic Metabolic Panel: Recent Labs  Lab 12/08/18 0519 12/09/18 0502 12/10/18 0423 12/11/18 0415 12/12/18 0341 12/13/18 1540  NA 142 140 136 136 138 139  K 3.6 3.7 3.7 4.7 4.2 4.3  CL 112* 107 102 102 105 105  CO2 26 23 25 26 26 25   GLUCOSE 108* 111* 144* 133* 142* 109*  BUN 12 11 20  28* 33* 28*  CREATININE 0.76 0.87 0.85 0.87 0.94 0.82  CALCIUM 8.6* 8.9 8.8* 9.0 9.0 9.3  MG 1.9 1.6* 2.3  --   --   --   PHOS 3.5 3.9  --   --   --   --    GFR: Estimated Creatinine Clearance: 71.9 mL/min (by C-G formula based on SCr of 0.82 mg/dL). Liver Function Tests: Recent Labs  Lab 12/08/18 0519  12/09/18 0502  AST 13* 16  ALT 10 11  ALKPHOS 45 46  BILITOT 0.4 0.5  PROT 5.6* 5.8*  ALBUMIN 2.7* 2.9*   No results for input(s): LIPASE, AMYLASE in the last 168 hours. No results for input(s): AMMONIA in the last 168 hours. Coagulation Profile: No results for input(s): INR, PROTIME in the last 168 hours. Cardiac Enzymes: No results for input(s): CKTOTAL, CKMB, CKMBINDEX, TROPONINI in the last 168 hours. BNP (last 3 results) No results for input(s): PROBNP in the last 8760 hours. HbA1C: No results for input(s): HGBA1C in the last 72 hours. CBG: Recent Labs  Lab 12/13/18 0833 12/13/18 1224 12/13/18 1705 12/14/18 0417 12/14/18 0844  GLUCAP 142* 117* 127* 107* 128*   Lipid Profile: No results for input(s): CHOL, HDL, LDLCALC, TRIG, CHOLHDL, LDLDIRECT in the last 72 hours. Thyroid Function Tests: No results for input(s): TSH, T4TOTAL, FREET4, T3FREE, THYROIDAB in the last 72 hours. Anemia Panel: No results for input(s): VITAMINB12, FOLATE, FERRITIN, TIBC, IRON, RETICCTPCT in the last 72 hours. Sepsis Labs: No results for input(s): PROCALCITON, LATICACIDVEN in the last 168 hours.  No results found for this or any previous visit (from the past 240 hour(s)).       Radiology Studies: Dg Swallowing Func-speech Pathology  Result Date: 12/14/2018 Objective Swallowing Evaluation: Type of Study: MBS-Modified Barium Swallow Study  Patient Details [name] MRN: [id number] Date of Birth: 01-19-1946 Today's Date: 12/14/2018 Time: SLP Start Time (ACUTE ONLY): 1705 -SLP Stop Time (ACUTE ONLY): 1720 SLP Time Calculation (min) (ACUTE ONLY): 15 min Past Medical History: Past Medical History: Diagnosis Date . Depression  . Hypercholesteremia  . Hypertension  Past Surgical History: Past Surgical History: Procedure Laterality Date . ABDOMINAL HYSTERECTOMY   . BIOPSY  11/17/2018  Procedure: BIOPSY;  Surgeon: Benancio DeedsArmbruster, Steven P, MD;  [facility];  Service:  Gastroenterology;; . CHOLECYSTECTOMY   . ESOPHAGOGASTRODUODENOSCOPY (EGD) WITH PROPOFOL N/A 11/17/2018  Procedure: ESOPHAGOGASTRODUODENOSCOPY (EGD) WITH PROPOFOL;  Surgeon: Benancio DeedsArmbruster, Steven P, MD;  [facility];  Service: Gastroenterology;  Laterality: N/A; . GASTRIC BYPASS   . thumb surgery   HPI: 73 y.o. female with new left sided weakness, unknown time of onset. Neurology Exam reveals left facial droop, left hemiplegia, rightward eye deviation, dysarthria and left hemineglect, consistent with a large right MCA infarction. CT head reveals hypoattenuation within the right basal ganglia involving the caudate nucleus, lentiform nucleus, and posterior limb of internal capsule, compatible with acute/early subacute infarction. MRI shows  R BG, small right posterior temporal infarcts.  Subjective: alert, cooperative Assessment / Plan / Recommendation [facility] IP CLINICAL IMPRESSIONS 12/14/2018 Clinical Impression Instrumental swallow study requested to objectively assess integrity of swallow mechanism in light of possible s/s of aspiration at bedside. Area of darkness, possible calcification, on aryynoids prior to introducing barium. This area remains stable throughout study and doesn't indicate penetration or aspiration of bolus.  There is also an area of darkness on the posterior portion of trachea that is not related to barium. Additionally, pt does intermittently cough without any evidence of aspiration/penetration or residue, thus she presents as possibly aspirating when consuming at the bedside. Pt continues to present with moderate to severe oral phase dysphagia c/b anterior spillage, decreased bolus manipulation, lingual pumping and premature spillage. Pt with delayed but functional swallow initiation at the pyriform sinuses with thin liquids via cup sips. Pt is able to obtain good larygneal closure with no penetration or aspiration observed. With larger straw sips of thin liquids, pt with silent aspiration of  bolus. Recommend continuing dysphagia 1 diet with thin liquids via controlled cup sips, medicine crushed in puree with full supervision/assist with PO intake. Given pt's severe cognitive deficits, pt continues to require strict aspiration precautions such as being alert, sitting upright with oral care prior to PO intake. Education provided to pt's nurse.  SLP Visit Diagnosis Dysphagia, oropharyngeal phase (R13.12) Attention and concentration deficit following -- Frontal lobe and executive function deficit following -- Impact on safety and function Mild aspiration risk   [facility] IP TREATMENT RECOMMENDATION 12/14/2018 Treatment Recommendations Therapy as outlined in treatment plan below   Prognosis 12/06/2018 Prognosis for Safe Diet Advancement Good Barriers to Reach Goals Cognitive deficits Barriers/Prognosis Comment -- [facility] IP DIET RECOMMENDATION 12/14/2018 SLP Diet Recommendations Dysphagia 1 (Puree) solids;Thin liquid Liquid Administration via Cup;No straw Medication Administration Crushed with puree Compensations Minimize environmental distractions;Slow rate;Small sips/bites Postural Changes Seated upright at 90 degrees   [facility] IP OTHER RECOMMENDATIONS 12/14/2018 Recommended Consults -- Oral Care Recommendations Oral care before and after PO Other Recommendations --   [facility] IP FOLLOW UP RECOMMENDATIONS 12/14/2018 Follow up Recommendations Skilled Nursing facility   [facility] IP FREQUENCY AND DURATION 12/14/2018 Speech Therapy Frequency (ACUTE ONLY) min 2x/week Treatment Duration 2 weeks      [facility] IP ORAL PHASE 12/14/2018 Oral Phase Impaired Oral - Pudding Teaspoon -- Oral - Pudding Cup -- Oral - Honey Teaspoon Weak lingual manipulation;Impaired mastication;Left anterior bolus loss;Lingual pumping;Decreased bolus cohesion;Delayed oral transit;Premature spillage Oral - Honey Cup Left anterior bolus loss;Weak lingual manipulation;Lingual pumping;Reduced posterior propulsion;Decreased bolus cohesion;Premature spillage;Delayed oral transit Oral -  Nectar Teaspoon Weak lingual manipulation;Lingual pumping;Reduced posterior propulsion;Decreased bolus cohesion;Delayed oral transit;Premature spillage Oral - Nectar Cup Weak lingual manipulation;Lingual pumping;Reduced posterior propulsion;Delayed oral transit;Decreased bolus cohesion;Premature spillage Oral - Nectar Straw Weak lingual manipulation;Lingual pumping;Reduced posterior propulsion;Delayed oral transit;Decreased bolus cohesion;Premature spillage Oral - Thin Teaspoon Lingual pumping;Weak lingual manipulation;Reduced posterior propulsion;Delayed oral transit;Decreased bolus cohesion;Premature spillage Oral - Thin Cup Left anterior bolus loss;Weak lingual manipulation;Lingual pumping;Reduced posterior propulsion;Delayed oral transit;Decreased bolus cohesion;Premature spillage Oral - Thin Straw Weak lingual manipulation;Lingual pumping;Reduced posterior propulsion;Delayed oral transit;Decreased bolus cohesion;Premature spillage Oral - Puree Impaired mastication;Weak lingual manipulation;Lingual pumping;Reduced posterior propulsion;Delayed oral transit;Decreased bolus cohesion;Premature spillage Oral - Mech Soft NT Oral - Regular -- Oral - Multi-Consistency -- Oral - Pill -- Oral Phase - Comment --  [facility] IP PHARYNGEAL PHASE 12/14/2018 Pharyngeal Phase Impaired Pharyngeal- Pudding Teaspoon -- Pharyngeal -- Pharyngeal- Pudding Cup -- Pharyngeal -- Pharyngeal- Honey Teaspoon Delayed  swallow initiation-vallecula Pharyngeal Material does not enter airway Pharyngeal- Honey Cup Delayed swallow initiation-vallecula Pharyngeal Material does not enter airway Pharyngeal- Nectar Teaspoon Delayed swallow initiation-pyriform sinuses Pharyngeal -- Pharyngeal- Nectar Cup Delayed swallow initiation-pyriform sinuses Pharyngeal -- Pharyngeal- Nectar Straw Delayed swallow initiation-pyriform sinuses Pharyngeal -- Pharyngeal- Thin Teaspoon Delayed swallow initiation-pyriform sinuses Pharyngeal -- Pharyngeal- Thin Cup Delayed swallow  initiation-pyriform sinuses Pharyngeal -- Pharyngeal- Thin Straw Delayed swallow initiation-pyriform sinuses;Penetration/Aspiration during swallow Pharyngeal Material enters airway, remains ABOVE vocal cords then ejected out;Material enters airway, passes BELOW cords without attempt by patient to eject out (silent aspiration) Pharyngeal- Puree Delayed swallow initiation-vallecula Pharyngeal -- Pharyngeal- Mechanical Soft NT Pharyngeal -- Pharyngeal- Regular -- Pharyngeal -- Pharyngeal- Multi-consistency -- Pharyngeal -- Pharyngeal- Pill -- Pharyngeal -- Pharyngeal Comment --  [facility] IP CERVICAL ESOPHAGEAL PHASE 12/14/2018 Cervical Esophageal Phase WFL Pudding Teaspoon -- Pudding Cup -- Honey Teaspoon -- Honey Cup -- Nectar Teaspoon -- Nectar Cup -- Nectar Straw -- Thin Teaspoon -- Thin Cup -- Thin Straw -- Puree -- Mechanical Soft -- Regular -- Multi-consistency -- Pill -- Cervical Esophageal Comment -- Happi Overton 12/14/2018, 11:44 AM                   Scheduled Meds: . aspirin  325 mg Per Tube Daily   Or  . aspirin  300 mg Rectal Daily  . atorvastatin  40 mg Per Tube q1800  . diltiazem  60 mg Per Tube Q6H  . enoxaparin (LOVENOX) injection  40 mg Subcutaneous Q24H  . escitalopram  10 mg Per Tube Daily  . famotidine  40 mg Per Tube BID  . feeding supplement (PRO-STAT SUGAR FREE 64)  30 mL Per Tube Daily  . iopamidol  100 mL Intravenous Once  . metoprolol tartrate  50 mg Per Tube BID  . mirtazapine  45 mg Per Tube QHS  . QUEtiapine  50 mg Per Tube QHS  . sodium chloride flush  10-40 mL Intracatheter Q12H  . venlafaxine  50 mg Per Tube BID   Continuous Infusions: . diltiazem (CARDIZEM) infusion Stopped (12/06/18 1408)  . feeding supplement (OSMOLITE 1.2 CAL) 1,000 mL (12/13/18 1521)     LOS: 10 days    Time spent: 35 minutes    Ramiro Harvest, MD [facility]  If 7PM-7AM, please contact night-coverage [email or web address] 12/14/2018, 12:03 PM

## 2018-12-14 NOTE — Progress Notes (Signed)
Modified Barium Swallow Progress Note  Patient Details  [name] MRN: [id number] Date of Birth: 08/04/1946  Today's Date: 12/14/2018  Modified Barium Swallow completed.  Full report located under Chart Review in the Imaging Section.  Brief recommendations include the following:  Clinical Impression  Instrumental swallow study requested to objectively assess integrity of swallow mechanism in light of possible s/s of aspiration at bedside. Area of darkness, possible calcification, on aryynoids prior to introducing barium. This area remains stable throughout study and doesn't indicate penetration or aspiration of bolus.  There is also an area of darkness on the posterior portion of trachea that is not related to barium. Additionally, pt does intermittently cough without any evidence of aspiration/penetration or residue, thus she presents as possibly aspirating when consuming at the bedside. Pt continues to present with moderate to severe oral phase dysphagia c/b anterior spillage, decreased bolus manipulation, lingual pumping and premature spillage. Pt with delayed but functional swallow initiation at the pyriform sinuses with thin liquids via cup sips. Pt is able to obtain good larygneal closure with no penetration or aspiration observed. With larger straw sips of thin liquids, pt with silent aspiration of bolus. Recommend continuing dysphagia 1 diet with thin liquids via controlled cup sips, medicine crushed in puree with full supervision/assist with PO intake. Given pt's severe cognitive deficits, pt continues to require strict aspiration precautions such as being alert, sitting upright with oral care prior to PO intake. Education provided to pt's nurse.    Swallow Evaluation Recommendations       SLP Diet Recommendations: Dysphagia 1 (Puree) solids;Thin liquid   Liquid Administration via: Cup;No straw   Medication Administration: Crushed with puree   Supervision: Full  supervision/cueing for compensatory strategies   Compensations: Minimize environmental distractions;Slow rate;Small sips/bites   Postural Changes: Seated upright at 90 degrees   Oral Care Recommendations: Oral care before and after PO        Adrienne Stone 12/14/2018,11:43 AM

## 2018-12-15 LAB — CBC WITH DIFFERENTIAL/PLATELET
Abs Immature Granulocytes: 0.04 10*3/uL (ref 0.00–0.07)
Basophils Absolute: 0 10*3/uL (ref 0.0–0.1)
Basophils Relative: 0 %
Eosinophils Absolute: 0.3 10*3/uL (ref 0.0–0.5)
Eosinophils Relative: 3 %
HCT: 32.9 % — ABNORMAL LOW (ref 36.0–46.0)
Hemoglobin: 10.4 g/dL — ABNORMAL LOW (ref 12.0–15.0)
Immature Granulocytes: 1 %
Lymphocytes Relative: 15 %
Lymphs Abs: 1.3 10*3/uL (ref 0.7–4.0)
MCH: 29.1 pg (ref 26.0–34.0)
MCHC: 31.6 g/dL (ref 30.0–36.0)
MCV: 92.2 fL (ref 80.0–100.0)
MONO ABS: 0.9 10*3/uL (ref 0.1–1.0)
Monocytes Relative: 10 %
Neutro Abs: 6.3 10*3/uL (ref 1.7–7.7)
Neutrophils Relative %: 71 %
Platelets: 382 10*3/uL (ref 150–400)
RBC: 3.57 MIL/uL — ABNORMAL LOW (ref 3.87–5.11)
RDW: 13.2 % (ref 11.5–15.5)
WBC: 8.9 10*3/uL (ref 4.0–10.5)
nRBC: 0 % (ref 0.0–0.2)

## 2018-12-15 LAB — GLUCOSE, CAPILLARY
Glucose-Capillary: 108 mg/dL — ABNORMAL HIGH (ref 70–99)
Glucose-Capillary: 118 mg/dL — ABNORMAL HIGH (ref 70–99)
Glucose-Capillary: 127 mg/dL — ABNORMAL HIGH (ref 70–99)
Glucose-Capillary: 146 mg/dL — ABNORMAL HIGH (ref 70–99)
Glucose-Capillary: 149 mg/dL — ABNORMAL HIGH (ref 70–99)
Glucose-Capillary: 161 mg/dL — ABNORMAL HIGH (ref 70–99)

## 2018-12-15 LAB — BASIC METABOLIC PANEL
Anion gap: 8 (ref 5–15)
BUN: 26 mg/dL — AB (ref 8–23)
CALCIUM: 8.9 mg/dL (ref 8.9–10.3)
CO2: 24 mmol/L (ref 22–32)
CREATININE: 0.9 mg/dL (ref 0.44–1.00)
Chloride: 105 mmol/L (ref 98–111)
GFR calc Af Amer: >60 mL/min (ref >60)
GFR calc non Af Amer: >60 mL/min (ref >60)
Glucose, Bld: 138 mg/dL — ABNORMAL HIGH (ref 70–99)
Potassium: 4.1 mmol/L (ref 3.5–5.1)
Sodium: 137 mmol/L (ref 135–145)

## 2018-12-15 NOTE — Progress Notes (Signed)
PROGRESS NOTE    Adrienne Stone  NGE:[id number] DOB: 02-17-1946 DOA: 12/03/2018 PCP: [name], PA-C   Brief Narrative:  HPI per Dr. Odie Sera on 12/03/2018 Adrienne Drivers Ballengeris a 73 y.o.femalewith medical history significant fordepression with anxiety, hypertension, atrial fibrillation not anticoagulated due to recurrent GI bleeding, now presenting to the emergency department from her nursing facility for evaluation of left-sided weakness, dysarthria, and fall. Patient is accompanied by her sister who assists with the history. Nursing facility personnel reported that the patient fell at approximately 1:30 PM, but had told the patient's sister that she fell at approximately 5 PM. Around the same time, she was noted to have a new speech difficulty and was not moving her left side very well. EMS was called and she was brought into the ED for evaluation of this. Patient's ability to provide a history is limited by her severe dysarthria. Her sister was with her yesterday, she seemed to be in her usual state at that time, and was not complaining of anything.  ED Course:Upon arrival to the ED, patient is found to be afebrile and mildly hypertensive. EKG features atrial fibrillation with PVC. Chest x-ray is notable for cardiomegaly, but no acute finding. Noncontrast head CT reveals hypoattenuation within the right basal ganglia concerning for acute/early subacute CVA, as well as a dense right M1 segment concerning for thrombus. Chemistry panel features a potassium of 5.3 and bilirubin 1.3. CBC is notable for a chronic normocytic anemia. Troponin is undetectable. Patient was given 500 cc normal saline. Neurology was consulted by the ED physician and recommended medical admission for further evaluation and management.   Assessment & Plan:   Principal Problem:   Acute ischemic stroke [facility]) Active Problems:   Anemia   Unspecified atrial fibrillation (HCC)   History of GI  bleed   Depression   Hypertension   Cerebrovascular accident (CVA) (HCC)   Goals of care, counseling/discussion   Palliative care by specialist   Dysphagia   Oropharyngeal dysphagia  Acute Ischemic CVA -Patient presented with acute onset left-sided weakness with slurred speech following a fall at her skilled nursing facility. -History of atrial fibrillation not on chronic anticoagulation secondary to recurrent GI bleeding.  -Imaging significant for an acute right basal ganglia/internal capsule/right posterior temporal lobe ischemic stroke.  -Patient with aphasia, left-sided weakness in both upper and lower extremities, lethargy which is slowly improving.  Patient seems more lethargic today however easily arousable and following commands.  -Hemoglobin A1c 4.9, LDL 83, TTE on 2/3 w/ EF 60-65%, afib, no source of embolism noted -MRA focal right M1 segment flow loss, narrowing right V4 segment -Carotid Doppler: bilateral ICA w/ 1-39% stenosis; right vertebral artery with high resistant flow -PT/OT recommend SNF -Patient seems to be more lethargic today however easily arousable and following commands appropriately. -Speech therapy recommends alternative means due to dysphasia -cortrak placed.  Aspirin changed to per tube for secondary stroke prophylaxis.  Continue statin.  Initially discontinued dysphagia 1 diet with pudding as patient had become more lethargic.  Patient was more alert and following commands over the past few days, and as such as been resumed back on dysphagia 1 diet with pudding thick liquids as recommended per speech therapy.  Patient lethargic today 12/15/2018.  Continue Osmolite per cortrak tube for now for nutrition as patient with poor oral intake.Marland Kitchen Permissive hypertension for 5 to 7 days per neurology recommendations.  Palliative care following.  Will need outpatient follow-up with neurology.  Oropharyngeal Dysphagia -Etiology secondary to  acute CVA as above.  -Speech  therapy following, failed swallow evaluation, with recommendations of alternative means of nutrition. -MBS 3/1 and per speech therapy passed for Puree Diet with Pudding Thick Liquids but very Somnolent and made NPO.  -IV fluids discontinued.  -Cortrak reordered and placed.  Speech therapy following.  Patient currently on tube feeds with Osmolite.  Patient has been started on dysphagia 1 diet with pudding thick liquids which patient seems to be tolerating.  Patient more alert and following commands.  Keep cortrak in place for now while monitoring patient on diet and continue tube feeds with Osmolite for adequate nutrition. -Per RN patient with some coughing while receiving medications over the past 2 days. Patient has been seen by speech therapy and patient underwent modified barium swallow.  Patient on a dysphagia 1 diet with pudding thick liquids.  See speech therapy recommendations.  Patient somewhat lethargic and as such we will continue tube feeds through cortrak for adequate nutrition.  Hyperlipidemia -LDL 83 with a goal less than 70. HDL 62. -Continue current regimen of atorvastatin 40 mg daily via cortrak.   Normocytic Anemia -Hemoglobin at 10.4.  Stable.  -History of GI bleed in the past with last EGD on 11/17/2018 with ulcer at that GJ anastomosis but no visible vessel.  -No signs of active bleeding. -On Aspirin as above for acute stroke, unlikely candidate for chronic anticoagulation due to her recurrent GI bleeds -Continue to Monitor for S/Sx of Bleeding -Repeat and Monitor CBC daily  Acute Hypoxic Respiratory Failure, worsened slightly today -Recent admission for influenza with discharge to SNF on 11/04/2018. No history of lung disease.  -Titrated off of oxygen and saturating 97% on room air today.  -Discontinued IVF now and given IV Lasix 40 mg x1 -Continue Xopenex/Atrovent q6h scheduled -CXR showed "Enteric tube is identified with tip well below the level of the GE junction.  Surgical clips are noted within the left upper quadrant of the abdomen. Normal heart size. No pleural effusion or edema. Aortic atherosclerosis. No airspace opacities. Coarsened interstitial markings throughout both lungs are again noted and appear unchanged."  Back Pain -T/L-spine x-rays notable for a chronic L1 compression fracture, otherwise no acute abnormalities. -Continue current pain regimen of Tylenol, Hydrocodone-Acetaminophen 1 tab po q6hprn Moderate pain and IV Morphine 1 mg q4hprn Severe Pain  Depression/Anxiety -Continue escitalopram 10 mg po Daily, Mirtazapine 45 mg po qHS, Quetiapine 50 mg po qHS, Lorazepam 0.5 mg po qHS, and Venlafaxine 50 mg po BID.  Essential Hypertension -Blood pressure improved.  Home regimen was clonidine 0.1 mg twice daily, Cardizem XR 360 mg daily, lisinopril 5 mg daily, metoprolol 75 mg twice daily.  Permissive hypertension initially during earlier part of the hospitalization.  Continue current regimen of Cardizem 60 mg per tube every 6 hours, Lopressor 50 mg twice daily per tube.  Persistent Atrial Fibrillation -Patient noted to have persistent/permanent A. fib on EKG.  -Not on chronic anticoagulation due to recurrent GI bleeds, most recently in February 2020. Etiology of her stroke likely thrombotic event possibly from underlying A. Fib.  -Overnight on 2/29-3/1 heart rate increased and was persistently with A. fib with RVR.  -Continue Aspirin as above -Cardizem gtt now discontinued as Rates improved and now still in A Fib with Rates in 60's -Continue Diltiazem 60 mg q6h via Cortrak Tube -Increased metoprolol tartrate 50 mg q12h via Cortrak Tube -Metoprolol 5 mg IV q6h prn for HR >105 sustained for 5 minutes -Continue to monitor on Telemetry  Ethics:Dr  Sheikh discussed with her Sister at bedside again. Patient with significant flaccid paralysis with decreased sensation to her left upper and lower extremity. Unclear if she will ever regain any  functional status to this side. She also has significant oral pharyngeal dysphasia from her stroke and SLP evaluated and had placed on Dysphagia 1 Diet with Pudding Thick Liquids but was extremely somnolent so made NPO and Cortrak placed.  Patient would not want PEG tube placed. Patient now more alert and has been placed back on a dysphagia 1 diet with pudding thick liquids per speech therapy.  Patient somewhat tolerating diet and being fed.  Palliative care following.   Hypomagnesemia -Magnesium at 2.3. Repleted.   Morbid Obesity -Estimated body mass index is 42.1 kg/m as calculated from the following:   Height as of this encounter:  (1.6 m).   Weight as of this encounter: 107.8 kg. -Dietician on board and patient currently on Osmolite via cortrak.  See dietitian's recommendations.  Abdominal pain Patient with complaints of deep abdominal pain.  On physical exam patient does not seem tender to palpation.  Patient having bowel movements.  Bladder scan with no significant urinary retention.  Abdominal films unremarkable.  Some clinical improvement.  Follow.     DVT prophylaxis: Lovenox Code Status: DNR Family Communication: No family at bedside.    Disposition Plan: Likely skilled nursing facility with palliative care following once tolerating oral intake adequately versus home with hospice if decompensates or declines.     Consultants:   Neurology  Palliative care  Procedures:   CT head 12/03/2018, 12/04/2018  Plain films of the T-spine 12/04/2018  Chest x-ray 12/08/2018  Plain films of the L-spine 12/04/2018  MRI MRA head 12/04/2018, 12/05/2018  Carotid ultrasound 12/04/2018  MBS 12/06/2018  Abdominal films 12/14/2018  Antimicrobials:   None   Subjective: Patient drowsy however easily arousable.  Patient dressed back off to sleep.  Patient noted to have received some pain medication this morning as well.  Patient denies any chest pain no shortness of breath.  Denies  any abdominal pain.   Objective: Vitals:   12/14/18 2313 12/15/18 0335 12/15/18 0500 12/15/18 0740  BP: (!) 138/99 106/76  (!) 148/69  Pulse: (!) 106 (!) 103  94  Resp: (!) Temp: 98.8 F (37.1 C) 98.5 F (36.9 C)  98.3 F (36.8 C)  TempSrc: Axillary Axillary  Oral  SpO2: 94% 96%  95%  Weight:   109.4 kg   Height:        Intake/Output Summary (Last 24 hours) at 12/15/2018 1134 Last data filed at 12/14/2018 1800 Gross per 24 hour  Intake 50 ml  Output -  Net 50 ml   Filed Weights   12/09/18 0430 12/10/18 0500 12/15/18 0500  Weight: 109.9 kg 110.7 kg 109.4 kg    Examination:  General exam: Lethargic.  Cortrak in place. Respiratory system: CTAB anterior lung fields..  No wheezes, no crackles, no rhonchi.  Normal respiratory effort.  Cardiovascular system: Irregularly irregular.  No JVD.  No lower extremity edema.   Gastrointestinal system: Abdomen is obese, soft, nontender, nondistended, positive bowel sounds.  No rebound.  No guarding.  Central nervous system: Drowsy.  Left upper extremity weakness with some rightward eye deviation.  Left facial droop.  Dysarthria.  Left hemineglect.  Extremities: Symmetric 5 x 5 power. Skin: No rashes, lesions or ulcers Psychiatry: Judgement and insight appear fair. Mood & affect flat.     Data Reviewed:  I have personally reviewed following labs and imaging studies  CBC: Recent Labs  Lab 12/09/18 0502 12/10/18 0423 12/12/18 0341 12/13/18 1540 12/15/18 0849  WBC 6.5 7.1  --   --  8.9  NEUTROABS 3.8  --   --   --  6.3  HGB 10.6* 10.9* 11.2* 10.6* 10.4*  HCT 33.6* 33.5* 35.8* 33.9* 32.9*  MCV 92.8 91.8  --   --  92.2  PLT 366 375  --   --  382   Basic Metabolic Panel: Recent Labs  Lab 12/09/18 0502 12/10/18 0423 12/11/18 0415 12/12/18 0341 12/13/18 1540 12/15/18 0849  NA 140 136 136 138 139 137  K 3.7 3.7 4.7 4.2 4.3 4.1  CL 107 102 102 105 105 105  CO2 GLUCOSE 111* 144* 133* 142* 109*  138*  BUN 11 20 28* 33* 28* 26*  CREATININE 0.87 0.85 0.87 0.94 0.82 0.90  CALCIUM 8.9 8.8* 9.0 9.0 9.3 8.9  MG 1.6* 2.3  --   --   --   --   PHOS 3.9  --   --   --   --   --    GFR: Estimated Creatinine Clearance: 65.1 mL/min (by C-G formula based on SCr of 0.9 mg/dL). Liver Function Tests: Recent Labs  Lab 12/09/18 0502  AST 16  ALT 11  ALKPHOS 46  BILITOT 0.5  PROT 5.8*  ALBUMIN 2.9*   No results for input(s): LIPASE, AMYLASE in the last 168 hours. No results for input(s): AMMONIA in the last 168 hours. Coagulation Profile: No results for input(s): INR, PROTIME in the last 168 hours. Cardiac Enzymes: No results for input(s): CKTOTAL, CKMB, CKMBINDEX, TROPONINI in the last 168 hours. BNP (last 3 results) No results for input(s): PROBNP in the last 8760 hours. HbA1C: No results for input(s): HGBA1C in the last 72 hours. CBG: Recent Labs  Lab 12/14/18 1218 12/14/18 1820 12/14/18 2014 12/15/18 0339 12/15/18 0736  GLUCAP 122* 128* 106* 127* 146*   Lipid Profile: No results for input(s): CHOL, HDL, LDLCALC, TRIG, CHOLHDL, LDLDIRECT in the last 72 hours. Thyroid Function Tests: No results for input(s): TSH, T4TOTAL, FREET4, T3FREE, THYROIDAB in the last 72 hours. Anemia Panel: No results for input(s): VITAMINB12, FOLATE, FERRITIN, TIBC, IRON, RETICCTPCT in the last 72 hours. Sepsis Labs: No results for input(s): PROCALCITON, LATICACIDVEN in the last 168 hours.  No results found for this or any previous visit (from the past 240 hour(s)).       Radiology Studies: Dg Abd 1 View  Result Date: 12/14/2018 CLINICAL DATA:  Acute right upper quadrant abdominal pain. EXAM: ABDOMEN - 1 VIEW COMPARISON:  None. FINDINGS: Large amount of residual contrast is seen throughout the colon and small bowel and rectum. No abnormal bowel dilatation is noted. Surgical clips are seen in the left side of the pelvis. Distal tip of feeding tube is seen in stomach. IMPRESSION: No evidence of  bowel obstruction or ileus. Electronically Signed   By: Lupita Raider, M.D.   On: 12/14/2018 19:18   Dg Swallowing Func-speech Pathology  Result Date: 12/14/2018 Objective Swallowing Evaluation: Type of Study: MBS-Modified Barium Swallow Study  Patient Details Name: ZORANA BROCKWELL MRN: [id number] Date of Birth: 02/15/1946 Today's Date: 12/14/2018 Time: SLP Start Time (ACUTE ONLY): 1705 -SLP Stop Time (ACUTE ONLY): 1720 SLP Time Calculation (min) (ACUTE ONLY): 15 min Past Medical History: Past Medical History: Diagnosis Date . Depression  . Hypercholesteremia  . Hypertension  Past Surgical History: Past Surgical History: Procedure Laterality Date . ABDOMINAL HYSTERECTOMY   . BIOPSY  11/17/2018  Procedure: BIOPSY;  Surgeon: Benancio DeedsArmbruster, Steven P, MD;  [facility];  Service: Gastroenterology;; . CHOLECYSTECTOMY   . ESOPHAGOGASTRODUODENOSCOPY (EGD) WITH PROPOFOL N/A 11/17/2018  Procedure: ESOPHAGOGASTRODUODENOSCOPY (EGD) WITH PROPOFOL;  Surgeon: Benancio DeedsArmbruster, Steven P, MD;  [facility];  Service: Gastroenterology;  Laterality: N/A; . GASTRIC BYPASS   . thumb surgery   HPI: 73 y.o. female with new left sided weakness, unknown time of onset. Neurology Exam reveals left facial droop, left hemiplegia, rightward eye deviation, dysarthria and left hemineglect, consistent with a large right MCA infarction. CT head reveals hypoattenuation within the right basal ganglia involving the caudate nucleus, lentiform nucleus, and posterior limb of internal capsule, compatible with acute/early subacute infarction. MRI shows R BG, small right posterior temporal infarcts.  Subjective: alert, cooperative Assessment / Plan / Recommendation CHL IP CLINICAL IMPRESSIONS 12/14/2018 Clinical Impression Instrumental swallow study requested to objectively assess integrity of swallow mechanism in light of possible s/s of aspiration at bedside. Area of darkness, possible calcification, on aryynoids prior to introducing barium.  This area remains stable throughout study and doesn't indicate penetration or aspiration of bolus.  There is also an area of darkness on the posterior portion of trachea that is not related to barium. Additionally, pt does intermittently cough without any evidence of aspiration/penetration or residue, thus she presents as possibly aspirating when consuming at the bedside. Pt continues to present with moderate to severe oral phase dysphagia c/b anterior spillage, decreased bolus manipulation, lingual pumping and premature spillage. Pt with delayed but functional swallow initiation at the pyriform sinuses with thin liquids via cup sips. Pt is able to obtain good larygneal closure with no penetration or aspiration observed. With larger straw sips of thin liquids, pt with silent aspiration of bolus. Recommend continuing dysphagia 1 diet with thin liquids via controlled cup sips, medicine crushed in puree with full supervision/assist with PO intake. Given pt's severe cognitive deficits, pt continues to require strict aspiration precautions such as being alert, sitting upright with oral care prior to PO intake. Education provided to pt's nurse.  SLP Visit Diagnosis Dysphagia, oropharyngeal phase (R13.12) Attention and concentration deficit following -- Frontal lobe and executive function deficit following -- Impact on safety and function Mild aspiration risk   CHL IP TREATMENT RECOMMENDATION 12/14/2018 Treatment Recommendations Therapy as outlined in treatment plan below   Prognosis 12/06/2018 Prognosis for Safe Diet Advancement Good Barriers to Reach Goals Cognitive deficits Barriers/Prognosis Comment -- CHL IP DIET RECOMMENDATION 12/14/2018 SLP Diet Recommendations Dysphagia 1 (Puree) solids;Thin liquid Liquid Administration via Cup;No straw Medication Administration Crushed with puree Compensations Minimize environmental distractions;Slow rate;Small sips/bites Postural Changes Seated upright at 90 degrees   CHL IP OTHER  RECOMMENDATIONS 12/14/2018 Recommended Consults -- Oral Care Recommendations Oral care before and after PO Other Recommendations --   CHL IP FOLLOW UP RECOMMENDATIONS 12/14/2018 Follow up Recommendations [facility] D[facility] 12/14/2018 Speech Therapy Frequency (ACUTE ONLY) min 2x/week Treatment Duration 2 weeks      CHL IP ORAL PHASE 12/14/2018 Oral Phase Impaired Oral - Pudding Teaspoon -- Oral - Pudding Cup -- Oral - Honey Teaspoon Weak lingual manipulation;Impaired mastication;Left anterior bolus loss;Lingual pumping;Decreased bolus cohesion;Delayed oral transit;Premature spillage Oral - Honey Cup Left anterior bolus loss;Weak lingual manipulation;Lingual pumping;Reduced posterior propulsion;Decreased bolus cohesion;Premature spillage;Delayed oral transit Oral - Nectar Teaspoon Weak lingual manipulation;Lingual pumping;Reduced posterior propulsion;Decreased bolus cohesion;Delayed oral transit;Premature spillage  Oral - Nectar Cup Weak lingual manipulation;Lingual pumping;Reduced posterior propulsion;Delayed oral transit;Decreased bolus cohesion;Premature spillage Oral - Nectar Straw Weak lingual manipulation;Lingual pumping;Reduced posterior propulsion;Delayed oral transit;Decreased bolus cohesion;Premature spillage Oral - Thin Teaspoon Lingual pumping;Weak lingual manipulation;Reduced posterior propulsion;Delayed oral transit;Decreased bolus cohesion;Premature spillage Oral - Thin Cup Left anterior bolus loss;Weak lingual manipulation;Lingual pumping;Reduced posterior propulsion;Delayed oral transit;Decreased bolus cohesion;Premature spillage Oral - Thin Straw Weak lingual manipulation;Lingual pumping;Reduced posterior propulsion;Delayed oral transit;Decreased bolus cohesion;Premature spillage Oral - Puree Impaired mastication;Weak lingual manipulation;Lingual pumping;Reduced posterior propulsion;Delayed oral transit;Decreased bolus cohesion;Premature spillage Oral - Mech Soft NT Oral  - Regular -- Oral - Multi-Consistency -- Oral - Pill -- Oral Phase - Comment --  CHL IP PHARYNGEAL PHASE 12/14/2018 Pharyngeal Phase Impaired Pharyngeal- Pudding Teaspoon -- Pharyngeal -- Pharyngeal- Pudding Cup -- Pharyngeal -- Pharyngeal- Honey Teaspoon Delayed swallow initiation-vallecula Pharyngeal Material does not enter airway Pharyngeal- Honey Cup Delayed swallow initiation-vallecula Pharyngeal Material does not enter airway Pharyngeal- Nectar Teaspoon Delayed swallow initiation-pyriform sinuses Pharyngeal -- Pharyngeal- Nectar Cup Delayed swallow initiation-pyriform sinuses Pharyngeal -- Pharyngeal- Nectar Straw Delayed swallow initiation-pyriform sinuses Pharyngeal -- Pharyngeal- Thin Teaspoon Delayed swallow initiation-pyriform sinuses Pharyngeal -- Pharyngeal- Thin Cup Delayed swallow initiation-pyriform sinuses Pharyngeal -- Pharyngeal- Thin Straw Delayed swallow initiation-pyriform sinuses;Penetration/Aspiration during swallow Pharyngeal Material enters airway, remains ABOVE vocal cords then ejected out;Material enters airway, passes BELOW cords without attempt by patient to eject out (silent aspiration) Pharyngeal- Puree Delayed swallow initiation-vallecula Pharyngeal -- Pharyngeal- Mechanical Soft NT Pharyngeal -- Pharyngeal- Regular -- Pharyngeal -- Pharyngeal- Multi-consistency -- Pharyngeal -- Pharyngeal- Pill -- Pharyngeal -- Pharyngeal Comment --  CHL IP CERVICAL ESOPHAGEAL PHASE 12/14/2018 Cervical Esophageal Phase WFL Pudding Teaspoon -- Pudding Cup -- Honey Teaspoon -- Honey Cup -- Nectar Teaspoon -- Nectar Cup -- Nectar Straw -- Thin Teaspoon -- Thin Cup -- Thin Straw -- Puree -- Mechanical Soft -- Regular -- Multi-consistency -- Pill -- Cervical Esophageal Comment -- Happi Overton 12/14/2018, 11:44 AM                   Scheduled Meds: . aspirin  325 mg Per Tube Daily   Or  . aspirin  300 mg Rectal Daily  . atorvastatin  40 mg Per Tube q1800  . diltiazem  60 mg Per Tube Q6H  .  enoxaparin (LOVENOX) injection  40 mg Subcutaneous Q24H  . escitalopram  10 mg Per Tube Daily  . famotidine  40 mg Per Tube BID  . feeding supplement (ENSURE ENLIVE)  237 mL Oral Q1500  . feeding supplement (PRO-STAT SUGAR FREE 64)  30 mL Per Tube Daily  . iopamidol  100 mL Intravenous Once  . metoprolol tartrate  50 mg Per Tube BID  . mirtazapine  45 mg Per Tube QHS  . QUEtiapine  50 mg Per Tube QHS  . sodium chloride flush  10-40 mL Intracatheter Q12H  . venlafaxine  50 mg Per Tube BID   Continuous Infusions: . feeding supplement (OSMOLITE 1.2 CAL) 1,000 mL (12/13/18 1521)     LOS: 11 days    Time spent: 35 minutes    Ramiro Harvest, MD [facility]  If 7PM-7AM, please contact night-coverage [email or web address] 12/15/2018, 11:34 AM

## 2018-12-15 NOTE — Progress Notes (Signed)
Physical Therapy Treatment Patient Details [name] MRN: [id number] DOB: 1946/04/19 Today's Date: 12/15/2018    History of Present Illness 73 y.o. female with medical history significant for depression with anxiety, hypertension, atrial fibrillation not anticoagulated due to recurrent GI bleeding, now presenting to the ED from her nursing facility for evaluation of left-sided weakness, dysarthria, and fall. 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 Smaller area of acute infarct right posterior temporal lobe. Pt with recent hospital admission and discharged to SNF on 2/13.   MRI 2/28 shows R BG stroke    PT Comments    Pt was assisted to side of bed and used support under LUE to avoid a tendency tolean entirely to that side.  Pt is sitting with eyes closed, and nurse arrived to share she had received pain meds.  No active LLE movement was elicited but did do active and assisted RLE exercises side of bed.  Back to bed due to her fatigue and concern for being awake enough to sit up yet.  Will recommend she get up with lift later when ready.     Follow Up Recommendations  SNF     Equipment Recommendations  None recommended by PT    Recommendations for Other Services       Precautions / Restrictions Precautions Precautions: Fall Precaution Comments: monitor HR, NG tube Restrictions Weight Bearing Restrictions: No    Mobility  Bed Mobility Overal bed mobility: Needs Assistance Bed Mobility: Rolling;Supine to Sit;Sit to Supine Rolling: Max assist   Supine to sit: Total assist Sit to supine: Total assist      Transfers Overall transfer level: Needs assistance               General transfer comment: too lethargic to attempt up inchair  Ambulation/Gait                 Stairs             Wheelchair Mobility    Modified Rankin (Stroke Patients Only)       Balance     Sitting balance-Leahy Scale: Poor Sitting  balance - Comments: pt sat side of bed wiht LUE supported on pillows to remain up longer                                    Cognition Arousal/Alertness: Lethargic(had pain meds per nsg) Behavior During Therapy: Flat affect Overall Cognitive Status: Impaired/Different from baseline Area of Impairment: Following commands;Memory;Problem solving                 Orientation Level: Place;Time;Situation   Memory: Decreased recall of precautions;Decreased short-term memory Following Commands: Follows one step commands inconsistently;Follows one step commands with increased time     Problem Solving: Slow processing;Decreased initiation;Requires verbal cues;Requires tactile cues General Comments: pt was unable to stay awake but was given pain meds      Exercises General Exercises - Lower Extremity Long Arc Quad: AAROM;AROM;PROM;20 reps;Both    General Comments        Pertinent Vitals/Pain Pain Assessment: Faces Pain Score: 0-No pain Faces Pain Scale: No hurt    Home Living                      Prior Function            PT Goals (current goals can now be found  in the care plan section) Acute Rehab PT Goals Patient Stated Goal: none stated today Progress towards PT goals: Progressing toward goals    Frequency    Min 3X/week      PT Plan Current plan remains appropriate    Co-evaluation              AM-PAC PT "6 Clicks" Mobility   Outcome Measure  Help needed turning from your back to your side while in a flat bed without using bedrails?: A Lot Help needed moving from lying on your back to sitting on the side of a flat bed without using bedrails?: Total Help needed moving to and from a bed to a chair (including a wheelchair)?: Total Help needed standing up from a chair using your arms (e.g., wheelchair or bedside chair)?: Total Help needed to walk in hospital room?: Total Help needed climbing 3-5 steps with a railing? : Total 6  Click Score: 7    End of Session   Activity Tolerance: Patient limited by lethargy Patient left: in bed;with call bell/phone within reach;with bed alarm set;with nursing/sitter in room Nurse Communication: Mobility status PT Visit Diagnosis: Other abnormalities of gait and mobility (R26.89);Muscle weakness (generalized) (M62.81);History of falling (Z91.81)     Time: 6387-5643 PT Time Calculation (min) (ACUTE ONLY): 31 min  Charges:  $Therapeutic Exercise: 8-22 mins $Therapeutic Activity: 8-22 mins                    Ivar Drape 12/15/2018, 9:55 AM  Samul Dada, PT MS Acute Rehab Dept. Number: [facility] [id number] and [facility] [phone]

## 2018-12-15 NOTE — Progress Notes (Signed)
  Speech Language Pathology Treatment: Dysphagia  Patient Details [name] MRN: [id number] DOB: [birth year] Today's Date: 12/15/2018 Time: 4709-6283 SLP Time Calculation (min) (ACUTE ONLY): 8 min  Assessment / Plan / Recommendation Clinical Impression  Pt required consistent verbal/tactile stimulation to maintain alertness during dysphagia session. Consumed puree texture with adequate oral manipulation and propulsion- using right side oral cavity and labial residue on left. Moderate verbal cues provided to bring eyes to midline for visualization of cup. Consumed sips via straw (therapist unaware at time of recommendation for no straws-sign not updated yet) and no s/s aspiration however she silently aspirated using straw during MBS. Did not appear appropriate to trial higher texture during this session. ST to continue for dysphagia/speech/cognition.    HPI HPI: 73 y.o. female with new left sided weakness, unknown time of onset. Neurology Exam reveals left facial droop, left hemiplegia, rightward eye deviation, dysarthria and left hemineglect, consistent with a large right MCA infarction. CT head reveals hypoattenuation within the right basal ganglia involving the caudate nucleus, lentiform nucleus, and posterior limb of internal capsule, compatible with acute/early subacute infarction. MRI shows R BG, small right posterior temporal infarcts.      SLP Plan  Continue with current plan of care       Recommendations  Diet recommendations: Dysphagia 1 (puree);Thin liquid Liquids provided via: Cup;No straw Medication Administration: Crushed with puree Supervision: Staff to assist with self feeding;Full supervision/cueing for compensatory strategies Compensations: Minimize environmental distractions;Slow rate;Small sips/bites Postural Changes and/or Swallow Maneuvers: Seated upright 90 degrees;Upright 30-60 min after meal                Oral Care Recommendations: Oral care  BID Follow up Recommendations: Skilled Nursing facility SLP Visit Diagnosis: Dysphagia, oropharyngeal phase (R13.12) Plan: Continue with current plan of care       GO                Royce Macadamia 12/15/2018, 12:13 PM  Adrienne Stone.Ed Nurse, children's [phone] Office [phone]

## 2018-12-15 NOTE — Plan of Care (Signed)
Patients vitals remained stable through night.  Stroke symptoms remained the same through night, no acute changes.

## 2018-12-15 NOTE — Progress Notes (Addendum)
Occupational Therapy Treatment Patient Details [name] MRN: [id number] DOB: 08-09-1946 Today's Date: 12/15/2018    History of present illness 73 y.o. female with medical history significant for depression with anxiety, hypertension, atrial fibrillation not anticoagulated due to recurrent GI bleeding, now presenting to the ED from her nursing facility for evaluation of left-sided weakness, dysarthria, and fall. 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 Smaller area of acute infarct right posterior temporal lobe. Pt with recent hospital admission and discharged to SNF on 2/13.   MRI 2/28 shows R BG stroke   OT comments  Patient supine in bed and agreeable to OT.  Engaged in self care from bed level with HOB elevated, continues to require max cueing to attend to, scan towards L, and engage L side in self care given hand over hand assist.  Increased time to process and initiate, inconsistent command following. Difficulty alternating task, perseverating on washing face and on exercises (previous exercise completed). PROM to LUE, noted AROM with shoulder elevation today.  Educated sister on positioning and splint use/benefit to L UE.  Splint schedule established.   OT NOTE  RN STAFF Schedule: night time use for 6-8 hours.  Please check splint every 4 hours during shift ( remove splint , remove stockinette/ dressing present) to assess for: * pain * redness *swelling  If any symptoms above present remove splint for 15 minutes. If symptoms continue - keep the splint removed and notify OT staff [phone] immediately.   Keep the UE elevated at all times on pillows / towels.  Splint can be cleaned with warm soapy water and alcohol swab. Splint should not be placed in heat of any kind because the splint with mold into a new shape.    Follow Up Recommendations  SNF;Supervision/Assistance - 24 hour    Equipment Recommendations  Other (comment)(TBD in next venue of  care)    Recommendations for Other Services      Precautions / Restrictions Precautions Precautions: Fall Precaution Comments: monitor HR, NG tube Restrictions Weight Bearing Restrictions: No       Mobility Bed Mobility               General bed mobility comments: deferred, bed level session with HOB elevated  Transfers                 General transfer comment: deferred due to safety this session    Balance                                           ADL either performed or assessed with clinical judgement   ADL Overall ADL's : Needs assistance/impaired   Eating/Feeding Details (indicate cue type and reason): assisted with taking a sip today, able to bring cup to mouth with hand over hand support initially with cueing for small sips  Grooming: Wash/dry face;Minimal assistance;Wash/dry hands;Bed level Grooming Details (indicate cue type and reason): pt able to wash face with multimodal cueing to attend to L side of face, min assist (hand over hand) to wash L hand.  Upper Body Bathing: Bed level;Maximal assistance Upper Body Bathing Details (indicate cue type and reason): HOB elevated, able to wash L arm with hand over hand support initally fading to supervision (washing hand to elbow); total UE bathing with max assist  Vision   Vision Assessment?: Yes Alignment/Gaze Preference: Gaze right Additional Comments: cueing to gaze towards L side, requires cueing to sustain and attend to L side    Perception     Praxis      Cognition Arousal/Alertness: Lethargic Behavior During Therapy: Flat affect Overall Cognitive Status: Impaired/Different from baseline Area of Impairment: Following commands;Memory;Problem solving;Attention                   Current Attention Level: Sustained Memory: Decreased recall of precautions;Decreased short-term memory Following Commands: Follows one step commands  inconsistently;Follows one step commands with increased time     Problem Solving: Slow processing;Decreased initiation;Requires verbal cues;Requires tactile cues General Comments: increased time to process and intiate tasks,  following simple 1 step commands inconsistently         Exercises Exercises: General Upper Extremity General Exercises - Upper Extremity Shoulder Flexion: PROM;Left;10 reps(AROM R ) Shoulder ABduction: PROM;Left;10 reps(AROM R ) Shoulder ADduction: PROM;10 reps;Left(AROM R ) Elbow Flexion: PROM;Left;10 reps(AROM R ) Elbow Extension: PROM;10 reps;Left(AROM R ) Wrist Flexion: PROM;10 reps;Left(AROM R ) Wrist Extension: PROM;10 reps;Left(AROM R ) Digit Composite Flexion: PROM;Left;10 reps Composite Extension: PROM;10 reps;Left Other Exercises Other Exercises: PROM L, AROM R supination/pronation, shoulder elevation  Other Exercises: splint wear schedule established L UE    Shoulder Instructions       General Comments VSS, max HR 107    Pertinent Vitals/ Pain       Pain Assessment: Faces Faces Pain Scale: No hurt  Home Living                                          Prior Functioning/Environment              Frequency  Min 2X/week        Progress Toward Goals  OT Goals(current goals can now be found in the care plan section)  Progress towards OT goals: Progressing toward goals  Acute Rehab OT Goals Patient Stated Goal: none stated today OT Goal Formulation: With patient Time For Goal Achievement: 12/18/18 Potential to Achieve Goals: Fair  Plan Discharge plan remains appropriate;Frequency remains appropriate    Co-evaluation                 AM-PAC OT "6 Clicks" Daily Activity     Outcome Measure   Help from another person eating meals?: A Lot Help from another person taking care of personal grooming?: A Lot Help from another person toileting, which includes using toliet, bedpan, or urinal?: Total Help from  another person bathing (including washing, rinsing, drying)?: A Lot Help from another person to put on and taking off regular upper body clothing?: Total Help from another person to put on and taking off regular lower body clothing?: Total 6 Click Score: 9    End of Session    OT Visit Diagnosis: Muscle weakness (generalized) (M62.81);Hemiplegia and hemiparesis Hemiplegia - Right/Left: Left Hemiplegia - dominant/non-dominant: Dominant Hemiplegia - caused by: Cerebral infarction   Activity Tolerance Patient limited by lethargy;Patient tolerated treatment well   Patient Left in bed;with call bell/phone within reach;with bed alarm set   Nurse Communication Mobility status;Precautions        Time: 0254-2706 OT Time Calculation (min): 35 min  Charges: OT General Charges $OT Visit: 1 Visit OT Treatments $Self Care/Home Management : 8-22 mins $Neuromuscular Re-education: 8-22 mins  Adrienne Stone  Adrienne Stone, OT Acute Rehabilitation Services Pager [phone] Office [phone]    Adrienne Stone 12/15/2018, 4:59 PM

## 2018-12-16 LAB — GLUCOSE, CAPILLARY
GLUCOSE-CAPILLARY: 139 mg/dL — AB (ref 70–99)
Glucose-Capillary: 130 mg/dL — ABNORMAL HIGH (ref 70–99)
Glucose-Capillary: 152 mg/dL — ABNORMAL HIGH (ref 70–99)
Glucose-Capillary: 95 mg/dL (ref 70–99)
Glucose-Capillary: 95 mg/dL (ref 70–99)

## 2018-12-16 NOTE — Progress Notes (Signed)
PROGRESS NOTE    Adrienne Stone  UIQ:[id number] DOB: 1945-10-26 DOA: 12/03/2018 PCP: [name], PA-C   Brief Narrative: 73 year old female with history of depression/anxiety, hypertension, A. fib not on anticoagulation due to recurrent GI bleeding, presented from nursing facility with left-sided weakness dysarthria and fall.  In the ER mildly hypertensive, EKG with A. fib, CT head revealed hypoattenuation within the right basal ganglia concerning for acute/early subacute CVA, patient was admitted for further management. 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  Seen by neurology,  Subjective: Patient was sleeping, woke up on calling, somewhat lethargic. was Able to interact, aware of what is going.  He still with small bore NG tube for nutrition.  Nausea vomiting chest pain shortness of breath. Unable to move her left side  Assessment & Plan:   Acute ischemic stroke of right basal ganglia with a small large area of acute infarct right posterior temporal lobe: patient with left flaccid weakness on the left side and dysphagia: In by neurology, completed stroke work-up. w hemoglobin A1c 4.9, LDL 83, TTE on 2/3 w/ EF 60-65%, no source of embolism noted.  Ekg with afib. MRA focal right M1 segment flow loss, narrowing right V4 segment. Carotid Doppler: bilateral ICA w/ 1-39% stenosis; right vertebral artery with high resistant flow.  Seen by PT OT speech therapy.PT OT recommends SNF.  Patient has cortrak for feeding.  Speech following and now able to be fed through mouth, but still not able to keep up with the nutrition. Continue aspirin 325, Lipitor.  Given her GI bleed, she was felt to be poor candidate for anticoagulation.  Oropharyngeal dysphagia: MBS 3/1. has cortrak for feeding.  Speech following and now able to be fed through mouth-on dysphagia 1 with thin liquid, but still not able to keep up with the nutrition.  Nutrition following, will see if we can safely pull cortrak  out and allow p.o.  Hyperlipidemia continue statin.    Normocytic anemia and history of GI bleeding, hemoglobin is stable.  History of GI bleed with last EGD 11/17/2018 with ulcer at the GJ anastomosis but no visible blood vessel.    Persistent atrial fibrillation : Rate controlled.  Unlikely candidate for anticoagulation due to her recurrent GI bleeding.  Continue p.o. Cardizem, metoprolol scheduled and PRN.  Anxiety/depression: Patient appears to have low mood, lethargic.  She is on Celexa, Remeron and Seroquel Ativan and venlafaxine.  Monitor overall mental status to optimize medication.    Hypertension: Blood pressure is fairly controlled. Continue current meds.   Goals of care palliative care is , following closely. Have discussed with patient's sister.  Given persistent left-sided weakness, difficulty with oral intake overall prognosis guarded.  Patient is very clear that she does not want PEG tube feeding.  Able to tolerate p.o., will increase her oral diet and see if NG tube can be pulled out.  Morbid obesity with BMI 42.1, dietitian on board, encourage nutrition.   DVT prophylaxis: lovenox Code Status: dnr Family Communication: family at bedside Disposition Plan: remains inpatient pending clinical improvement.  Will need skilled nursing facility with palliative care following once able to come off tube feeding.If decompensates may need to consider hospice early on   Consultants:   Neurology  Palliative care Procedures:  CT head 12/03/2018, 12/04/2018  Plain films of the T-spine 12/04/2018  Chest x-ray 12/08/2018  Plain films of the L-spine 12/04/2018  MRI MRA head 12/04/2018, 12/05/2018  Carotid ultrasound 12/04/2018  MBS 12/06/2018  Abdominal films 12/14/2018 Antimicrobials:  Anti-infectives (From admission, onward)   None     Objective: Vitals:   12/16/18 0403 12/16/18 0405 12/16/18 0752 12/16/18 1117  BP: (!) 113/51  (!) 151/96 (!) 155/79  Pulse: (!) 102  73 88   Resp: 16  20 20   Temp: 97.9 F (36.6 C)  98.1 F (36.7 C) 98.5 F (36.9 C)  TempSrc: Oral  Oral Axillary  SpO2: 96%  100% 97%  Weight:  110.2 kg    Height:        Intake/Output Summary (Last 24 hours) at 12/16/2018 1439 Last data filed at 12/16/2018 1009 Gross per 24 hour  Intake 180 ml  Output -  Net 180 ml   Filed Weights   12/10/18 0500 12/15/18 0500 12/16/18 0405  Weight: 110.7 kg 109.4 kg 110.2 kg   Weight change: 0.8 kg  Body mass index is 43.04 kg/m.  Intake/Output from previous day: 03/10 0701 - 03/11 0700 In: 180 [P.O.:180] Out: -  Intake/Output this shift: Total I/O In: 60 [P.O.:60] Out: -   Examination:  General exam: Lethargic but able to respond, converse some. NGT+ . Obese. HEENT:PERRL,Oral mucosa moist, Ear/Nose normal on gross exam Respiratory system: Bilateral equal air entry, normal vesicular breath sounds, no wheezes or crackles  Cardiovascular system: S1 & S2 heard,No JVD, murmurs. Gastrointestinal system: Abdomen is  soft, non tender, non distended, BS +  Nervous System: Lethargic sleepy but able to wake up and interact.  Left-sided flaccid paralysis present able to move her right side.  Left hemineglect, dysarthria, left facial droop present. Extremities: No edema, no clubbing, distal peripheral pulses palpable. Skin: No rashes, lesions, no icterus MSK: Normal muscle bulk,tone ,power  Medications:  Scheduled Meds: . aspirin  325 mg Per Tube Daily   Or  . aspirin  300 mg Rectal Daily  . atorvastatin  40 mg Per Tube q1800  . diltiazem  60 mg Per Tube Q6H  . enoxaparin (LOVENOX) injection  40 mg Subcutaneous Q24H  . escitalopram  10 mg Per Tube Daily  . famotidine  40 mg Per Tube BID  . feeding supplement (ENSURE ENLIVE)  237 mL Oral Q1500  . feeding supplement (PRO-STAT SUGAR FREE 64)  30 mL Per Tube Daily  . iopamidol  100 mL Intravenous Once  . metoprolol tartrate  50 mg Per Tube BID  . mirtazapine  45 mg Per Tube QHS  . QUEtiapine   50 mg Per Tube QHS  . sodium chloride flush  10-40 mL Intracatheter Q12H  . venlafaxine  50 mg Per Tube BID   Continuous Infusions: . feeding supplement (OSMOLITE 1.2 CAL) 1,000 mL (12/16/18 5852)    Data Reviewed: I have personally reviewed following labs and imaging studies  CBC: Recent Labs  Lab 12/10/18 0423 12/12/18 0341 12/13/18 1540 12/15/18 0849  WBC 7.1  --   --  8.9  NEUTROABS  --   --   --  6.3  HGB 10.9* 11.2* 10.6* 10.4*  HCT 33.5* 35.8* 33.9* 32.9*  MCV 91.8  --   --  92.2  PLT 375  --   --  382   Basic Metabolic Panel: Recent Labs  Lab 12/10/18 0423 12/11/18 0415 12/12/18 0341 12/13/18 1540 12/15/18 0849  NA 136 136 138 139 137  K 3.7 4.7 4.2 4.3 4.1  CL 102 102 105 105 105  CO2 25 26 26 25 24   GLUCOSE 144* 133* 142* 109* 138*  BUN 20 28* 33* 28* 26*  CREATININE 0.85 0.87 0.94  0.82 0.90  CALCIUM 8.8* 9.0 9.0 9.3 8.9  MG 2.3  --   --   --   --    GFR: Estimated Creatinine Clearance: 65.4 mL/min (by C-G formula based on SCr of 0.9 mg/dL). Liver Function Tests: No results for input(s): AST, ALT, ALKPHOS, BILITOT, PROT, ALBUMIN in the last 168 hours. No results for input(s): LIPASE, AMYLASE in the last 168 hours. No results for input(s): AMMONIA in the last 168 hours. Coagulation Profile: No results for input(s): INR, PROTIME in the last 168 hours. Cardiac Enzymes: No results for input(s): CKTOTAL, CKMB, CKMBINDEX, TROPONINI in the last 168 hours. BNP (last 3 results) No results for input(s): PROBNP in the last 8760 hours. HbA1C: No results for input(s): HGBA1C in the last 72 hours. CBG: Recent Labs  Lab 12/15/18 2006 12/15/18 2335 12/16/18 0344 12/16/18 0813 12/16/18 1114  GLUCAP 108* 149* 139* 130* 152*   Lipid Profile: No results for input(s): CHOL, HDL, LDLCALC, TRIG, CHOLHDL, LDLDIRECT in the last 72 hours. Thyroid Function Tests: No results for input(s): TSH, T4TOTAL, FREET4, T3FREE, THYROIDAB in the last 72 hours. Anemia  Panel: No results for input(s): VITAMINB12, FOLATE, FERRITIN, TIBC, IRON, RETICCTPCT in the last 72 hours. Sepsis Labs: No results for input(s): PROCALCITON, LATICACIDVEN in the last 168 hours.  No results found for this or any previous visit (from the past 240 hour(s)).    Radiology Studies: Dg Abd 1 View  Result Date: 12/14/2018 CLINICAL DATA:  Acute right upper quadrant abdominal pain. EXAM: ABDOMEN - 1 VIEW COMPARISON:  None. FINDINGS: Large amount of residual contrast is seen throughout the colon and small bowel and rectum. No abnormal bowel dilatation is noted. Surgical clips are seen in the left side of the pelvis. Distal tip of feeding tube is seen in stomach. IMPRESSION: No evidence of bowel obstruction or ileus. Electronically Signed   By: Lupita RaiderJames  Green Jr, M.D.   On: 12/14/2018 19:18      LOS: 12 days   Time spent: More than 50% of that time was spent in counseling and/or coordination of care.  Lanae Boastamesh Brianny Soulliere, MD [facility]  12/16/2018, 2:39 PM

## 2018-12-16 NOTE — Plan of Care (Signed)
Patient stable, unable to participate in POC at this time. 

## 2018-12-17 LAB — GLUCOSE, CAPILLARY
Glucose-Capillary: 114 mg/dL — ABNORMAL HIGH (ref 70–99)
Glucose-Capillary: 106 mg/dL — ABNORMAL HIGH (ref 70–99)
Glucose-Capillary: 133 mg/dL — ABNORMAL HIGH (ref 70–99)
Glucose-Capillary: 91 mg/dL (ref 70–99)

## 2018-12-17 MED ORDER — QUETIAPINE FUMARATE 25 MG PO TABS
25.0000 mg | ORAL_TABLET | Freq: Every day | ORAL | Status: DC
  Administered 2018-12-18 – 2018-12-19 (×2): 25 mg
  Filled 2018-12-17 (×2): qty 1

## 2018-12-17 MED ORDER — MIRTAZAPINE 15 MG PO TABS
15.0000 mg | ORAL_TABLET | Freq: Every day | ORAL | Status: DC
  Administered 2018-12-17 – 2018-12-19 (×3): 15 mg
  Filled 2018-12-17 (×3): qty 1

## 2018-12-17 NOTE — Progress Notes (Signed)
Physical Therapy Treatment Patient Details [name] MRN: [id number] DOB: 04/06/1946 Today's Date: 12/17/2018    History of Present Illness 73 y.o. female with medical history significant for depression with anxiety, hypertension, atrial fibrillation not anticoagulated due to recurrent GI bleeding, now presenting to the ED from her nursing facility for evaluation of left-sided weakness, dysarthria, and fall. 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 Smaller area of acute infarct right posterior temporal lobe. Pt with recent hospital admission and discharged to SNF on 2/13.   MRI 2/28 shows R BG stroke    PT Comments    Pt was relatively lethargic, but woke up to participate minimally.  Emphasis on transitions, sitting balance,  Sitting activity EOB, transfers.   Follow Up Recommendations  SNF     Equipment Recommendations  None recommended by PT    Recommendations for Other Services       Precautions / Restrictions      Mobility  Bed Mobility Overal bed mobility: Needs Assistance Bed Mobility: Rolling;Sidelying to Sit;Sit to Sidelying Rolling: Total assist;+2 for physical assistance;+2 for safety/equipment Sidelying to sit: Total assist;+2 for physical assistance;+2 for safety/equipment   Sit to supine: Total assist;+2 for physical assistance   General bed mobility comments: assisted up toward R more functional side.  Pt assisted up via R elbow at trunk adding stability so pt could help a little with the R UE  Transfers Overall transfer level: Needs assistance   Transfers: Squat Pivot Transfers     Squat pivot transfers: Total assist;+2 physical assistance     General transfer comment: Used a face to face squat pivot transfer via incontinence pad to the recliner with lift pad left under patient  Ambulation/Gait                 Stairs             Wheelchair Mobility    Modified Rankin (Stroke Patients Only) Modified  Rankin (Stroke Patients Only) Pre-Morbid Rankin Score: Moderately severe disability Modified Rankin: Severe disability     Balance Overall balance assessment: Needs assistance Sitting-balance support: Feet supported;No upper extremity supported Sitting balance-Leahy Scale: Poor(to fair) Sitting balance - Comments: pt sat EOB for 8 min workin on sitting balance, upright sitting with some LAQ kicks to challenge balance.       Standing balance comment: Reliant on a lot of external support                            Cognition Arousal/Alertness: Lethargic Behavior During Therapy: Flat affect Overall Cognitive Status: (NT formally due to pt not aroused enough.)                                        Exercises Other Exercises Other Exercises: hip/knee AAROM/PROM bil to warm up knee.    General Comments        Pertinent Vitals/Pain Pain Assessment: Faces Pain Score: 0-No pain Faces Pain Scale: No hurt    Home Living                      Prior Function            PT Goals (current goals can now be found in the care plan section) Acute Rehab PT Goals Patient Stated Goal: none stated today PT  Goal Formulation: With patient Time For Goal Achievement: 12/25/18 Potential to Achieve Goals: Fair Progress towards PT goals: Progressing toward goals;Goals downgraded-see care plan    Frequency    Min 3X/week      PT Plan Current plan remains appropriate    Co-evaluation              AM-PAC PT "6 Clicks" Mobility   Outcome Measure  Help needed turning from your back to your side while in a flat bed without using bedrails?: A Lot Help needed moving from lying on your back to sitting on the side of a flat bed without using bedrails?: Total Help needed moving to and from a bed to a chair (including a wheelchair)?: Total Help needed standing up from a chair using your arms (e.g., wheelchair or bedside chair)?: Total Help needed to  walk in hospital room?: Total Help needed climbing 3-5 steps with a railing? : Total 6 Click Score: 7    End of Session   Activity Tolerance: Patient limited by lethargy Patient left: in chair;with call bell/phone within reach;with chair alarm set(on maxi lift pad.) Nurse Communication: Mobility status PT Visit Diagnosis: Other abnormalities of gait and mobility (R26.89);Muscle weakness (generalized) (M62.81);History of falling (Z91.81)     Time: 3300-7622 PT Time Calculation (min) (ACUTE ONLY): 25 min  Charges:  $Therapeutic Activity: 23-37 mins                     12/17/2018  Pelham Bing, PT Acute Rehabilitation Services [phone]  (pager) [phone]  (office)   Adrienne Stone 12/17/2018, 4:37 PM

## 2018-12-17 NOTE — Progress Notes (Signed)
  Speech Language Pathology Treatment: Dysphagia;Cognitive-Linquistic  Patient Details [name] MRN: [id number] DOB: November 01, 1945 Today's Date: 12/17/2018 Time: 0034-9179 SLP Time Calculation (min) (ACUTE ONLY): 25 min  Assessment / Plan / Recommendation Clinical Impression  Pt was seen for skilled ST targeting cognitive and dysphagia goals.   Pt was seated upright in recliner upon therapist's arrival having just completed PT therapy session.  Pt was awake and alert but very inattentive and distractible, nonpurposefully turning the TV on and off with her remote control with poor awareness, max cues needed for cessation and redirection.  SLP facilitated the session with presentations of thin liquids, purees, and advanced dys 2 textures to continue to work towards diet progression.  Pt demonstrated no overt s/s of aspiration with purees but had intermittent, sporadic coughing with mixed solid and liquid consistencies.  Pt did have coughing on MBS in the absence of aspiration or penetration but for now would recommend that pt remain on her currently prescribed diet with full supervision for use of swallowing precautions.  Throughout session, pt needed max cues for task organization and sequencing when self feeding.  She also demonstrated right gaze preference, needing mod-max cues to locate items on the left side of her table tray.  Pt verbalizes awareness that her cognition is impaired; however, she makes no attempts to correct errors during structured tasks.  SLP will continue to follow along to address goals for swallowing, speech, and cognition.    HPI HPI: 73 y.o. female with new left sided weakness, unknown time of onset. Neurology Exam reveals left facial droop, left hemiplegia, rightward eye deviation, dysarthria and left hemineglect, consistent with a large right MCA infarction. CT head reveals hypoattenuation within the right basal ganglia involving the caudate nucleus, lentiform  nucleus, and posterior limb of internal capsule, compatible with acute/early subacute infarction. MRI shows R BG, small right posterior temporal infarcts.      SLP Plan  Continue with current plan of care       Recommendations  Diet recommendations: Dysphagia 1 (puree);Thin liquid Liquids provided via: Cup;No straw Medication Administration: Crushed with puree Supervision: Full supervision/cueing for compensatory strategies;Staff to assist with self feeding Compensations: Minimize environmental distractions;Slow rate;Small sips/bites Postural Changes and/or Swallow Maneuvers: Seated upright 90 degrees;Upright 30-60 min after meal                Oral Care Recommendations: Oral care BID Follow up Recommendations: Skilled Nursing facility SLP Visit Diagnosis: Dysphagia, oropharyngeal phase (R13.12);Cognitive communication deficit (R41.841) Plan: Continue with current plan of care       GO                PageMelanee Spry 12/17/2018, 3:53 PM

## 2018-12-17 NOTE — Progress Notes (Signed)
PROGRESS NOTE    Adrienne Stone  EYC:[id number] DOB: 1946/08/19 DOA: 12/03/2018 PCP: [name], PA-C   Brief Narrative: 73 year old female with history of depression/anxiety, hypertension, A. fib not on anticoagulation due to recurrent GI bleeding, presented from nursing facility with left-sided weakness dysarthria and fall.  In the ER mildly hypertensive, EKG with A. fib, CT head revealed hypoattenuation within the right basal ganglia concerning for acute/early subacute CVA, patient was admitted for further management. 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  Seen by neurology,  Subjective: Patient seen this morning.  Appears lethargic today wakes up briefly and goes back to sleep.   NO acute events overnight.  Assessment & Plan:   Acute ischemic stroke of right basal ganglia with a small large area of acute infarct right posterior temporal lobe: patient with left flaccid weakness on the left side and dysphagia: In by neurology, completed stroke work-up. w hemoglobin A1c 4.9, LDL 83, TTE on 2/3 w/ EF 60-65%, no source of embolism noted.  Ekg with afib. MRA focal right M1 segment flow loss, narrowing right V4 segment. Carotid Doppler: bilateral ICA w/ 1-39% stenosis; right vertebral artery with high resistant flow.  Seen by PT OT speech therapy.PT OT recommends SNF.  Patient has cortrak for feeding.  Speech following and now able to be fed through mouth, but still not able to keep up with the nutrition.  Patient appears more lethargic today than yesterday, briefly wakes up on command. She is on aspirin 325, Lipitor.  Given her GI bleed, she was felt to be poor candidate for anticoagulation.  Oropharyngeal dysphagia: MBS 3/1. has cortrak for feeding continue the same, worse lethargic today, allow p.o as tolerated. Speech following and allowed for dysphagia 1 diet with with thin liquid  Hyperlipidemia continue statin.    Normocytic anemia and history of GI bleeding,  hemoglobin is stable.  History of GI bleed with last EGD 11/17/2018 with ulcer at the GJ anastomosis but no visible blood vessel.    Persistent atrial fibrillation : Rate controlled.Unlikely candidate for anticoagulation due to her recurrent GI bleeding.  Continue p.o. Cardizem, metoprolol scheduled and PRN.  Anxiety/depression: Patient appears to have low mood, lethargic.  She is on Celexa, Remeron and Seroquel Ativan and venlafaxine. I will minimize psychotropic medications to see if her lethargy improves-continue emergency 15 mg, hold Seroquel tonight and resume at lower dose.    Hypertension: BP stable to soft . On Cardizem 60 mg every 6 hours and metoprolol 50 mg BID.   Goals of care palliative care is following closely.  Morbid obesity with BMI 42.1, dietitian on board, encourage nutrition.  DVT prophylaxis: lovenox Code Status: DNR Family Communication: No family at bedside Disposition Plan: remains inpatient pending clinical improvement.  Will need skilled nursing facility with palliative care following once able to come off tube feeding.If decompensates may need to consider hospice early on.  Given persistent left-sided weakness, difficulty with oral intake overall prognosis guarded.  Patient is very clear that she does not want PEG tube feeding.  Able to tolerate p.o., will increase her oral diet and see if NG tube can be pulled out. Today more lethargic, monitor closely. Overall prognosis is guarded. Did P2P yesterday per insurance doctor's SNF has not been denied but due to her lethargy and difficulty to participate w POC she needs to stay in the hospital for longer for now before going to SNF.  Consultants:   Neurology  Palliative care Procedures:  CT head 12/03/2018, 12/04/2018  Plain films of the T-spine 12/04/2018  Chest x-ray 12/08/2018  Plain films of the L-spine 12/04/2018  MRI MRA head 12/04/2018, 12/05/2018  Carotid ultrasound 12/04/2018  MBS 12/06/2018  Abdominal  films 12/14/2018 Antimicrobials: Anti-infectives (From admission, onward)   None     Objective: Vitals:   12/17/18 0000 12/17/18 0414 12/17/18 0758 12/17/18 1140  BP: 134/71 140/63 120/82 99/76  Pulse: 85 84 83 90  Resp: 18 18 19 16   Temp: 98.5 F (36.9 C) 97.7 F (36.5 C) 98.2 F (36.8 C) 98.5 F (36.9 C)  TempSrc: Oral Oral Oral Axillary  SpO2: 95% 98% 100% 99%  Weight:      Height:        Intake/Output Summary (Last 24 hours) at 12/17/2018 1316 Last data filed at 12/17/2018 0900 Gross per 24 hour  Intake 600 ml  Output 650 ml  Net -50 ml   Filed Weights   12/10/18 0500 12/15/18 0500 12/16/18 0405  Weight: 110.7 kg 109.4 kg 110.2 kg   Weight change:   Body mass index is 43.04 kg/m.  Intake/Output from previous day: 03/11 0701 - 03/12 0700 In: 540 [P.O.:540] Out: -  Intake/Output this shift: Total I/O In: 120 [P.O.:120] Out: 650 [Urine:650]  Examination:  General exam: Obese, lethargic, comfortable HEENT:Oral mucosa moist, Ear/Nose WNL grossly, dentition normal. NGT+ Respiratory system: Bilateral diminished air entry, no crackles and wheezing, no use of accessory muscle, non tender on palpation. Cardiovascular system: regular rate and rhythm, S1 & S2 heard, No JVD/murmurs. Gastrointestinal system: Abdomen soft, non-tender, obese, BS +.  Nervous System: Lethargic, left side w flaccid weakness Extremities: No edema, distal peripheral pulses palpable.  Skin: No rashes,no icterus. MSK: Normal muscle bulk,tone, power  Medications:  Scheduled Meds: . aspirin  325 mg Per Tube Daily   Or  . aspirin  300 mg Rectal Daily  . atorvastatin  40 mg Per Tube q1800  . diltiazem  60 mg Per Tube Q6H  . enoxaparin (LOVENOX) injection  40 mg Subcutaneous Q24H  . escitalopram  10 mg Per Tube Daily  . famotidine  40 mg Per Tube BID  . feeding supplement (ENSURE ENLIVE)  237 mL Oral Q1500  . feeding supplement (PRO-STAT SUGAR FREE 64)  30 mL Per Tube Daily  . iopamidol   100 mL Intravenous Once  . metoprolol tartrate  50 mg Per Tube BID  . mirtazapine  45 mg Per Tube QHS  . QUEtiapine  50 mg Per Tube QHS  . sodium chloride flush  10-40 mL Intracatheter Q12H  . venlafaxine  50 mg Per Tube BID   Continuous Infusions: . feeding supplement (OSMOLITE 1.2 CAL) Stopped (12/16/18 1448)    Data Reviewed: I have personally reviewed following labs and imaging studies  CBC: Recent Labs  Lab 12/12/18 0341 12/13/18 1540 12/15/18 0849  WBC  --   --  8.9  NEUTROABS  --   --  6.3  HGB 11.2* 10.6* 10.4*  HCT 35.8* 33.9* 32.9*  MCV  --   --  92.2  PLT  --   --  382   Basic Metabolic Panel: Recent Labs  Lab 12/11/18 0415 12/12/18 0341 12/13/18 1540 12/15/18 0849  NA 136 138 139 137  K 4.7 4.2 4.3 4.1  CL 102 105 105 105  CO2 26 26 25 24   GLUCOSE 133* 142* 109* 138*  BUN 28* 33* 28* 26*  CREATININE 0.87 0.94 0.82 0.90  CALCIUM 9.0 9.0 9.3 8.9   GFR: Estimated Creatinine Clearance:  65.4 mL/min (by C-G formula based on SCr of 0.9 mg/dL). Liver Function Tests: No results for input(s): AST, ALT, ALKPHOS, BILITOT, PROT, ALBUMIN in the last 168 hours. No results for input(s): LIPASE, AMYLASE in the last 168 hours. No results for input(s): AMMONIA in the last 168 hours. Coagulation Profile: No results for input(s): INR, PROTIME in the last 168 hours. Cardiac Enzymes: No results for input(s): CKTOTAL, CKMB, CKMBINDEX, TROPONINI in the last 168 hours. BNP (last 3 results) No results for input(s): PROBNP in the last 8760 hours. HbA1C: No results for input(s): HGBA1C in the last 72 hours. CBG: Recent Labs  Lab 12/16/18 1114 12/16/18 1649 12/16/18 1953 12/17/18 0755 12/17/18 1123  GLUCAP 152* 95 95 114* 106*   Lipid Profile: No results for input(s): CHOL, HDL, LDLCALC, TRIG, CHOLHDL, LDLDIRECT in the last 72 hours. Thyroid Function Tests: No results for input(s): TSH, T4TOTAL, FREET4, T3FREE, THYROIDAB in the last 72 hours. Anemia Panel: No  results for input(s): VITAMINB12, FOLATE, FERRITIN, TIBC, IRON, RETICCTPCT in the last 72 hours. Sepsis Labs: No results for input(s): PROCALCITON, LATICACIDVEN in the last 168 hours.  No results found for this or any previous visit (from the past 240 hour(s)).    Radiology Studies: No results found.    LOS: 13 days   Time spent: More than 50% of that time was spent in counseling and/or coordination of care.  Lanae Boast, MD [facility]  12/17/2018, 1:16 PM

## 2018-12-18 LAB — GLUCOSE, CAPILLARY
Glucose-Capillary: 105 mg/dL — ABNORMAL HIGH (ref 70–99)
Glucose-Capillary: 109 mg/dL — ABNORMAL HIGH (ref 70–99)
Glucose-Capillary: 98 mg/dL (ref 70–99)
Glucose-Capillary: 98 mg/dL (ref 70–99)
Glucose-Capillary: 99 mg/dL (ref 70–99)

## 2018-12-18 MED ORDER — DILTIAZEM HCL ER COATED BEADS 240 MG PO CP24
240.0000 mg | ORAL_CAPSULE | Freq: Every day | ORAL | Status: DC
  Filled 2018-12-18: qty 1

## 2018-12-18 MED ORDER — DILTIAZEM HCL 30 MG PO TABS
60.0000 mg | ORAL_TABLET | Freq: Four times a day (QID) | ORAL | Status: DC
  Administered 2018-12-18 – 2018-12-20 (×7): 60 mg via ORAL
  Filled 2018-12-18 (×7): qty 2

## 2018-12-18 NOTE — Progress Notes (Signed)
CSW following for discharge plan. Patient has been denied by [facility] at this time as she is too lethargic to participate in therapy, they are saying she's not medically ready at this time. MD in agreement.   CSW to follow.  Blenda Nicely, [place] Clinical Social Worker [phone]

## 2018-12-18 NOTE — Progress Notes (Signed)
PROGRESS NOTE    Adrienne Stone  AOZ:[id number] DOB: 1946/01/30 DOA: 12/03/2018 PCP: [name], PA-C   Brief Narrative: 73 year old female with history of depression/anxiety, hypertension, A. fib not on anticoagulation due to recurrent GI bleeding, presented from nursing facility with left-sided weakness dysarthria and fall.  In the ER mildly hypertensive, EKG with A. fib, CT head revealed hypoattenuation within the right basal ganglia concerning for acute/early subacute CVA, patient was admitted for further management. 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  Seen by neurology,  Subjective: Patient is alert awake oriented x3, cortrak tube is removed.  Tolerating p.o.  No acute events overnight.  Assessment & Plan:   Acute ischemic stroke of right basal ganglia with a small large area of acute infarct right posterior temporal lobe:With left-sided flaccid weakness, dysphagia.  Seen by neurology, completed stroke work-up. w hemoglobin A1c 4.9, LDL 83, TTE on 2/3 w/ EF 60-65%, no source of embolism noted.  Ekg with afib. MRA focal right M1 segment flow loss, narrowing right V4 segment. Carotid Doppler: bilateral ICA w/ 1-39% stenosis; right vertebral artery with high resistant flow.  Continue PT OT and ST.  Patient will need extensive rehabilitation awaiting for placement at this time.  Cont aspirin 325, Lipitor.  Given her GI bleed, she was felt to be poor candidate for anticoagulation for her A. fib.  Oropharyngeal dysphagia: MBS 3/1. has cortrak for feeding and off 3/13. Now eating orally.  Continue with speech.  On dysphagia 1 diet with with thin liquid  Hyperlipidemia continue statin.    Normocytic anemia and history of GI bleeding, hemoglobin is stable.  History of GI bleed with last EGD 11/17/2018 with ulcer at the GJ anastomosis but no visible blood vessel.    Persistent atrial fibrillation:Rate controlled.Unlikely candidate for anticoagulation due to her recurrent GI  bleeding.  Continue p.o. Cardizem q 6-hour, metoprolol scheduled and PRN.  Anxiety/depression: Patient appears to have low mood. She is on Celexa, Remeron and Seroquel Ativan and venlafaxine.  Given her lethargy,we've cut down remeron to 15 mg, and Seroquel at lower dose.    Hypertension: BP controlled on Cardizem and metoprolol.    Goals of care palliative care is following closely.  Had extensive discussion with patient and patient's sister, if patient does not do well they are leaning towards palliative care, and  at this time patient seems to be improving at least in her mental status however remains with guarded prognosis given her stroke and left sided weakness.  Morbid obesity with BMI 42.1, dietitian on board, encourage nutrition.  DVT prophylaxis: lovenox. Code Status: DNR. Family Communication: Updated sister at bedside. Disposition Plan: Patient seems to be getting better today.  Much more alert awake able to participate with PT.  We will plan on skilled nursing facility.  Social worker notified.    Did P2P few days back and per insurance doctor's SNF has not been denied but due to her lethargy and difficulty to participate w POC she needs to stay in the hospital for longer for now before going to SNF.  Consultants:   Neurology  Palliative care Procedures:  CT head 12/03/2018, 12/04/2018  Plain films of the T-spine 12/04/2018  Chest x-ray 12/08/2018  Plain films of the L-spine 12/04/2018  MRI MRA head 12/04/2018, 12/05/2018  Carotid ultrasound 12/04/2018  MBS 12/06/2018  Abdominal films 12/14/2018 Antimicrobials: Anti-infectives (From admission, onward)   None     Objective: Vitals:   12/17/18 2007 12/17/18 2333 12/18/18 9629 12/18/18 0810  BP: (!) 144/61 (!) 127/108 (!) 140/94 137/68  Pulse: 73 (!) 105 (!) 43 90  Resp: 18 17 19 18   Temp: 97.8 F (36.6 C) 98.5 F (36.9 C) 98.5 F (36.9 C) 98.9 F (37.2 C)  TempSrc: Oral Oral Oral Axillary  SpO2: 100% 97% 98% 96%   Weight:      Height:        Intake/Output Summary (Last 24 hours) at 12/18/2018 1254 Last data filed at 12/18/2018 0412 Gross per 24 hour  Intake 360 ml  Output 900 ml  Net -540 ml   Filed Weights   12/10/18 0500 12/15/18 0500 12/16/18 0405  Weight: 110.7 kg 109.4 kg 110.2 kg   Weight change:   Body mass index is 43.04 kg/m.  Intake/Output from previous day: 03/12 0701 - 03/13 0700 In: 480 [P.O.:480] Out: 1550 [Urine:1550] Intake/Output this shift: No intake/output data recorded.  Examination: General exam: Calm, comfortable, obese, not in acute distress, older for age, average built.  HEENT:Oral mucosa moist, Ear/Nose WNL grossly, dentition normal. Respiratory system: Bilateral equal air entry, no crackles and wheezing, no use of accessory muscle, non tender on palpation. Cardiovascular system: regular rate and rhythm, S1 & S2 heard, No JVD/murmurs. Gastrointestinal system: Abdomen soft, non-tender, non-distended, BS +. No hepatosplenomegaly palpable. Nervous System:Alert, awake and oriented x 3, left-sided flaccid weakness Extremities: No edema, distal peripheral pulses palpable.  Skin: No rashes,no icterus. MSK: Normal muscle bulk,tone, power  Medications:  Scheduled Meds: . aspirin  325 mg Per Tube Daily   Or  . aspirin  300 mg Rectal Daily  . atorvastatin  40 mg Per Tube q1800  . diltiazem  60 mg Oral Q6H  . enoxaparin (LOVENOX) injection  40 mg Subcutaneous Q24H  . escitalopram  10 mg Per Tube Daily  . famotidine  40 mg Per Tube BID  . feeding supplement (ENSURE ENLIVE)  237 mL Oral Q1500  . iopamidol  100 mL Intravenous Once  . metoprolol tartrate  50 mg Per Tube BID  . mirtazapine  15 mg Per Tube QHS  . QUEtiapine  25 mg Per Tube QHS  . sodium chloride flush  10-40 mL Intracatheter Q12H  . venlafaxine  50 mg Per Tube BID   Continuous Infusions:   Data Reviewed: I have personally reviewed following labs and imaging studies  CBC: Recent Labs  Lab  12/12/18 0341 12/13/18 1540 12/15/18 0849  WBC  --   --  8.9  NEUTROABS  --   --  6.3  HGB 11.2* 10.6* 10.4*  HCT 35.8* 33.9* 32.9*  MCV  --   --  92.2  PLT  --   --  382   Basic Metabolic Panel: Recent Labs  Lab 12/12/18 0341 12/13/18 1540 12/15/18 0849  NA 138 139 137  K 4.2 4.3 4.1  CL 105 105 105  CO2 26 25 24   GLUCOSE 142* 109* 138*  BUN 33* 28* 26*  CREATININE 0.94 0.82 0.90  CALCIUM 9.0 9.3 8.9   GFR: Estimated Creatinine Clearance: 65.4 mL/min (by C-G formula based on SCr of 0.9 mg/dL). Liver Function Tests: No results for input(s): AST, ALT, ALKPHOS, BILITOT, PROT, ALBUMIN in the last 168 hours. No results for input(s): LIPASE, AMYLASE in the last 168 hours. No results for input(s): AMMONIA in the last 168 hours. Coagulation Profile: No results for input(s): INR, PROTIME in the last 168 hours. Cardiac Enzymes: No results for input(s): CKTOTAL, CKMB, CKMBINDEX, TROPONINI in the last 168 hours. BNP (last 3  results) No results for input(s): PROBNP in the last 8760 hours. HbA1C: No results for input(s): HGBA1C in the last 72 hours. CBG: Recent Labs  Lab 12/17/18 1557 12/17/18 2038 12/18/18 0010 12/18/18 0412 12/18/18 0807  GLUCAP 133* 91 99 105* 109*   Lipid Profile: No results for input(s): CHOL, HDL, LDLCALC, TRIG, CHOLHDL, LDLDIRECT in the last 72 hours. Thyroid Function Tests: No results for input(s): TSH, T4TOTAL, FREET4, T3FREE, THYROIDAB in the last 72 hours. Anemia Panel: No results for input(s): VITAMINB12, FOLATE, FERRITIN, TIBC, IRON, RETICCTPCT in the last 72 hours. Sepsis Labs: No results for input(s): PROCALCITON, LATICACIDVEN in the last 168 hours.  No results found for this or any previous visit (from the past 240 hour(s)).    Radiology Studies: No results found.    LOS: 14 days   Time spent: More than 50% of that time was spent in counseling and/or coordination of care.  Lanae Boast, MD [facility]  12/18/2018, 12:54  PM

## 2018-12-19 LAB — GLUCOSE, CAPILLARY
Glucose-Capillary: 105 mg/dL — ABNORMAL HIGH (ref 70–99)
Glucose-Capillary: 107 mg/dL — ABNORMAL HIGH (ref 70–99)
Glucose-Capillary: 149 mg/dL — ABNORMAL HIGH (ref 70–99)
Glucose-Capillary: 93 mg/dL (ref 70–99)
Glucose-Capillary: 96 mg/dL (ref 70–99)
Glucose-Capillary: 99 mg/dL (ref 70–99)

## 2018-12-19 NOTE — Progress Notes (Signed)
PROGRESS NOTE    Adrienne Stone  ZOX:[id number] DOB: May 05, 1946 DOA: 12/03/2018 PCP: [name], PA-C   Brief Narrative: 73 year old female with history of depression/anxiety, hypertension, A. fib not on anticoagulation due to recurrent GI bleeding, presented from nursing facility with left-sided weakness dysarthria and fall.  In the ER mildly hypertensive, EKG with A. fib, CT head revealed hypoattenuation within the right basal ganglia concerning for acute/early subacute CVA, patient was admitted for further management. 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  Seen by neurology,  Subjective: Patient seen and examined.No acute events overnight.  Resting comfortably. Denies nausea vomiting chest pain  Assessment & Plan:  Acute ischemic stroke of right basal ganglia with a small large area of acute infarct right posterior temporal lobe:With left-sided flaccid weakness, dysphagia.  Seen by neurology, completed stroke work-up. w hemoglobin A1c 4.9, LDL 83, TTE on 2/3 w/ EF 60-65%, no source of embolism noted.  Ekg with afib. MRA focal right M1 segment flow loss, narrowing right V4 segment. Carotid Doppler: bilateral ICA w/ 1-39% stenosis; right vertebral artery with high resistant flow.  Continue PT OT and ST.Patient will need extensive rehabilitation awaiting for placement at this time.  Cont aspirin 325, Lipitor.Given her GI bleed, she was felt to be poor candidate for anticoagulation for her A. fib.  Oropharyngeal dysphagia: MBS 3/1. has cortrak for feeding and off 3/13. Now eating orally.and per ST on dysphagia 1 diet with with thin liquid  Hyperlipidemia continue statin.    Normocytic anemia and history of GI bleeding, hemoglobin is stable.  History of GI bleed with last EGD 11/17/2018 with ulcer at the GJ anastomosis but no visible blood vessel.    Persistent atrial fibrillation: Currently normal sinus rhythm and rate control.  Patient felt to be poor candidate for  anticoagulation due to her recurrent GI bleeding.  Continue Cardizem q 6-hour, metoprolol.   Anxiety/depression: Patient appears to have low mood. She is on Celexa, Remeron and Seroquel Ativan and venlafaxine.  Given her lethargy,we've cut down remeron to 15 mg, and Seroquel at lower dose, she seems to be feeling much improved/much more awake.    Hypertension: BP fairly controlled on  Cardizem and metoprolol.    Goals of care palliative care is following closely.  Had extensive discussion with patient and patient's sister, if patient does not do well they are leaning towards palliative care, and  at this time patient seems to be improving at least in her mental status however remains with guarded prognosis given her stroke and left sided weakness.  Morbid obesity with BMI 42.1, dietitian on board, encourage nutrition.  DVT prophylaxis: lovenox. Code Status: DNR. Family Communication: Updated sister at bedside previously. Disposition Plan: Overall mental status much improved.  Continue PT OT ST.  At this point awaiting for skilled nursing facility placement.   Consultants:   Neurology  Palliative care Procedures:  CT head 12/03/2018, 12/04/2018  Plain films of the T-spine 12/04/2018  Chest x-ray 12/08/2018  Plain films of the L-spine 12/04/2018  MRI MRA head 12/04/2018, 12/05/2018  Carotid ultrasound 12/04/2018  MBS 12/06/2018  Abdominal films 12/14/2018 Antimicrobials: Anti-infectives (From admission, onward)   None     Objective: Vitals:   12/18/18 1937 12/18/18 2338 12/19/18 0434 12/19/18 0802  BP: (!) 141/58 (!) 148/80 (!) 159/97 (!) 144/78  Pulse: 90 92 84 78  Resp: Temp: 98.9 F (37.2 C) 98.7 F (37.1 C) 98.4 F (36.9 C) 98.4 F (36.9 C)  TempSrc:  Oral Oral Oral Oral  SpO2: 99% 96% 95% 99%  Weight:      Height:        Intake/Output Summary (Last 24 hours) at 12/19/2018 1045 Last data filed at 12/19/2018 0600 Gross per 24 hour  Intake 720 ml  Output  1150 ml  Net -430 ml   Filed Weights   12/10/18 0500 12/15/18 0500 12/16/18 0405  Weight: 110.7 kg 109.4 kg 110.2 kg   Weight change:   Body mass index is 43.04 kg/m.  Intake/Output from previous day: 03/13 0701 - 03/14 0700 In: 840 [P.O.:840] Out: 1150 [Urine:1150] Intake/Output this shift: No intake/output data recorded.  Examination: General exam: Calm, comfortable, not in acute distress, older for age, average built.  Obese. HEENT:Oral mucosa moist, Ear/Nose WNL grossly, dentition normal. Respiratory system: Bilateral equal air entry, no crackles and wheezing, no use of accessory muscle, non tender on palpation. Cardiovascular system: regular rate and rhythm, S1 & S2 heard, No JVD/murmurs. Gastrointestinal system: Abdomen soft, non-tender, non-distended, BS +. No hepatosplenomegaly palpable. Nervous System:Alert, awake and oriented at baseline. Able to move UE and LE on right, on left side able to wiggle her toes, unable to move her left arm, facial droop present. Extremities: No edema, distal peripheral pulses palpable.  Skin: No rashes,no icterus. MSK: Normal muscle bulk,tone, power  Medications:  Scheduled Meds: . aspirin  325 mg Per Tube Daily   Or  . aspirin  300 mg Rectal Daily  . atorvastatin  40 mg Per Tube q1800  . diltiazem  60 mg Oral Q6H  . enoxaparin (LOVENOX) injection  40 mg Subcutaneous Q24H  . escitalopram  10 mg Per Tube Daily  . famotidine  40 mg Per Tube BID  . feeding supplement (ENSURE ENLIVE)  237 mL Oral Q1500  . iopamidol  100 mL Intravenous Once  . metoprolol tartrate  50 mg Per Tube BID  . mirtazapine  15 mg Per Tube QHS  . QUEtiapine  25 mg Per Tube QHS  . sodium chloride flush  10-40 mL Intracatheter Q12H  . venlafaxine  50 mg Per Tube BID   Continuous Infusions:   Data Reviewed: I have personally reviewed following labs and imaging studies  CBC: Recent Labs  Lab 12/13/18 1540 12/15/18 0849  WBC  --  8.9  NEUTROABS  --  6.3   HGB 10.6* 10.4*  HCT 33.9* 32.9*  MCV  --  92.2  PLT  --  382   Basic Metabolic Panel: Recent Labs  Lab 12/13/18 1540 12/15/18 0849  NA 139 137  K 4.3 4.1  CL 105 105  CO2 25 24  GLUCOSE 109* 138*  BUN 28* 26*  CREATININE 0.82 0.90  CALCIUM 9.3 8.9   GFR: Estimated Creatinine Clearance: 65.4 mL/min (by C-G formula based on SCr of 0.9 mg/dL). Liver Function Tests: No results for input(s): AST, ALT, ALKPHOS, BILITOT, PROT, ALBUMIN in the last 168 hours. No results for input(s): LIPASE, AMYLASE in the last 168 hours. No results for input(s): AMMONIA in the last 168 hours. Coagulation Profile: No results for input(s): INR, PROTIME in the last 168 hours. Cardiac Enzymes: No results for input(s): CKTOTAL, CKMB, CKMBINDEX, TROPONINI in the last 168 hours. BNP (last 3 results) No results for input(s): PROBNP in the last 8760 hours. HbA1C: No results for input(s): HGBA1C in the last 72 hours. CBG: Recent Labs  Lab 12/18/18 1638 12/18/18 1937 12/18/18 2345 12/19/18 0437 12/19/18 0759  GLUCAP 98 98 105* 99 107*  Lipid Profile: No results for input(s): CHOL, HDL, LDLCALC, TRIG, CHOLHDL, LDLDIRECT in the last 72 hours. Thyroid Function Tests: No results for input(s): TSH, T4TOTAL, FREET4, T3FREE, THYROIDAB in the last 72 hours. Anemia Panel: No results for input(s): VITAMINB12, FOLATE, FERRITIN, TIBC, IRON, RETICCTPCT in the last 72 hours. Sepsis Labs: No results for input(s): PROCALCITON, LATICACIDVEN in the last 168 hours.  No results found for this or any previous visit (from the past 240 hour(s)).    Radiology Studies: No results found.    LOS: 15 days   Time spent: More than 50% of that time was spent in counseling and/or coordination of care.  Lanae Boast, MD [facility]  12/19/2018, 10:45 AM

## 2018-12-19 NOTE — Plan of Care (Signed)
  Problem: Education: Goal: Knowledge of disease or condition will improve Outcome: Progressing Goal: Knowledge of secondary prevention will improve Outcome: Progressing Goal: Knowledge of patient specific risk factors addressed and post discharge goals established will improve Outcome: Progressing Goal: Individualized Educational Video(s) Outcome: Progressing   Problem: Coping: Goal: Will verbalize positive feelings about self Outcome: Progressing Goal: Will identify appropriate support needs Outcome: Progressing   Problem: Health Behavior/Discharge Planning: Goal: Ability to manage health-related needs will improve Outcome: Progressing   Problem: Self-Care: Goal: Ability to participate in self-care as condition permits will improve Outcome: Progressing Goal: Verbalization of feelings and concerns over difficulty with self-care will improve Outcome: Progressing   Problem: Nutrition: Goal: Risk of aspiration will decrease Outcome: Progressing   Problem: Ischemic Stroke/TIA Tissue Perfusion: Goal: Complications of ischemic stroke/TIA will be minimized Outcome: Progressing   Problem: Education: Goal: Knowledge of General Education information will improve Description Including pain rating scale, medication(s)/side effects and non-pharmacologic comfort measures Outcome: Progressing   Problem: Health Behavior/Discharge Planning: Goal: Ability to manage health-related needs will improve Outcome: Progressing   Problem: Clinical Measurements: Goal: Ability to maintain clinical measurements within normal limits will improve Outcome: Progressing

## 2018-12-20 LAB — GLUCOSE, CAPILLARY
GLUCOSE-CAPILLARY: 100 mg/dL — AB (ref 70–99)
Glucose-Capillary: 103 mg/dL — ABNORMAL HIGH (ref 70–99)
Glucose-Capillary: 106 mg/dL — ABNORMAL HIGH (ref 70–99)
Glucose-Capillary: 129 mg/dL — ABNORMAL HIGH (ref 70–99)

## 2018-12-20 MED ORDER — METOPROLOL TARTRATE 50 MG PO TABS
50.0000 mg | ORAL_TABLET | Freq: Two times a day (BID) | ORAL | Status: DC
  Administered 2018-12-20 – 2018-12-25 (×11): 50 mg via ORAL
  Filled 2018-12-20 (×11): qty 1

## 2018-12-20 MED ORDER — FAMOTIDINE 20 MG PO TABS
40.0000 mg | ORAL_TABLET | Freq: Two times a day (BID) | ORAL | Status: DC
  Administered 2018-12-20 – 2018-12-25 (×11): 40 mg via ORAL
  Filled 2018-12-20 (×12): qty 2

## 2018-12-20 MED ORDER — LORAZEPAM 0.5 MG PO TABS: 0.5000 mg | ORAL_TABLET | Freq: Every evening | ORAL | Status: DC | PRN

## 2018-12-20 MED ORDER — ATORVASTATIN CALCIUM 40 MG PO TABS
40.0000 mg | ORAL_TABLET | Freq: Every day | ORAL | Status: DC
  Administered 2018-12-20 – 2018-12-24 (×5): 40 mg via ORAL
  Filled 2018-12-20 (×5): qty 1

## 2018-12-20 MED ORDER — MIRTAZAPINE 15 MG PO TABS
15.0000 mg | ORAL_TABLET | Freq: Every day | ORAL | Status: DC
  Administered 2018-12-20 – 2018-12-24 (×5): 15 mg via ORAL
  Filled 2018-12-20 (×5): qty 1

## 2018-12-20 MED ORDER — HYDROCODONE-ACETAMINOPHEN 5-325 MG PO TABS: 1.0000 | ORAL_TABLET | Freq: Four times a day (QID) | ORAL | Status: DC | PRN

## 2018-12-20 MED ORDER — QUETIAPINE FUMARATE 25 MG PO TABS
25.0000 mg | ORAL_TABLET | Freq: Every day | ORAL | Status: DC
  Administered 2018-12-20 – 2018-12-24 (×5): 25 mg via ORAL
  Filled 2018-12-20 (×5): qty 1

## 2018-12-20 MED ORDER — VENLAFAXINE HCL 50 MG PO TABS
50.0000 mg | ORAL_TABLET | Freq: Two times a day (BID) | ORAL | Status: DC
  Administered 2018-12-20 – 2018-12-25 (×11): 50 mg via ORAL
  Filled 2018-12-20 (×12): qty 1

## 2018-12-20 MED ORDER — ACETAMINOPHEN 650 MG RE SUPP: 650.0000 mg | RECTAL | Status: DC | PRN

## 2018-12-20 MED ORDER — ACETAMINOPHEN 325 MG PO TABS
650.0000 mg | ORAL_TABLET | ORAL | Status: DC | PRN
  Administered 2018-12-20 – 2018-12-24 (×4): 650 mg via ORAL
  Filled 2018-12-20 (×4): qty 2

## 2018-12-20 MED ORDER — ACETAMINOPHEN 160 MG/5ML PO SOLN: 650.0000 mg | ORAL | Status: DC | PRN

## 2018-12-20 MED ORDER — SENNOSIDES-DOCUSATE SODIUM 8.6-50 MG PO TABS: 1.0000 | ORAL_TABLET | Freq: Every evening | ORAL | Status: DC | PRN

## 2018-12-20 MED ORDER — ESCITALOPRAM OXALATE 10 MG PO TABS
10.0000 mg | ORAL_TABLET | Freq: Every day | ORAL | Status: DC
  Administered 2018-12-20 – 2018-12-25 (×6): 10 mg via ORAL
  Filled 2018-12-20 (×6): qty 1

## 2018-12-20 MED ORDER — DILTIAZEM HCL ER COATED BEADS 240 MG PO CP24
240.0000 mg | ORAL_CAPSULE | Freq: Every day | ORAL | Status: DC
  Administered 2018-12-20 – 2018-12-21 (×2): 240 mg via ORAL
  Filled 2018-12-20 (×2): qty 1

## 2018-12-20 NOTE — Progress Notes (Signed)
PROGRESS NOTE    Adrienne Stone  ZOX:[id number] DOB: July 17, 1946 DOA: 12/03/2018 PCP: [name], PA-C   Brief Narrative: 73 year old female with history of depression/anxiety, hypertension, A. fib not on anticoagulation due to recurrent GI bleeding, presented from nursing facility with left-sided weakness dysarthria and fall.  In the ER mildly hypertensive, EKG with A. fib, CT head revealed hypoattenuation within the right basal ganglia concerning for acute/early subacute CVA, patient was admitted for further management. 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  Seen by neurology,  Subjective: Patient but appears much more alert awake oriented, participating with PT, no new complaints.  Assessment & Plan:  Acute ischemic stroke of right basal ganglia with a small large area of acute infarct right posterior temporal lobe:With left-sided flaccid weakness, dysphagia.  Seen by neurology, completed stroke work-up. w hemoglobin A1c 4.9, LDL 83, TTE on 2/3 w/ EF 60-65%, no source of embolism noted.  Ekg with afib. MRA focal right M1 segment flow loss, narrowing right V4 segment. Carotid Doppler: bilateral ICA w/ 1-39% stenosis; right vertebral artery with high resistant flow.  Continue PT OT and ST.Patient will need extensive rehabilitation awaiting for placement at this time.  We will continue on aspirin 325 MG, Lipitor.Given her GI bleed, she was felt to be poor candidate for anticoagulation for her A. fib.  Oropharyngeal dysphagia: MBS 3/1. has cortrak for feeding and off 3/13.  Tolerating diet ordered. As per ST on dysphagia 1 diet with with thin liquid  Hyperlipidemia continue statin.    Normocytic anemia and history of GI bleeding, hemoglobin is stable.  History of GI bleed with last EGD 11/17/2018 with ulcer at the GJ anastomosis but no visible blood vessel.    Persistent atrial fibrillation: Currently normal sinus rhythm and rate control.  Patient felt to be poor candidate  for anticoagulation due to her recurrent GI bleeding.  Will change to Cardizem CD. Cont metoprolol.   Anxiety/depression: Patient appears to have low mood. She is on Celexa, Remeron and Seroquel Ativan and venlafaxine.  Given her lethargy,we've cut down remeron to 15 mg, and Seroquel at lower dose seems to be doing well, much more alert awake oriented and participating with her care.  Hypertension: BP well controlled on  Cardizem and metoprolol.    Goals of care palliative care is following closely.  Had extensive discussion with patient and patient's sister, if patient does not do well they are leaning towards palliative care, and  at this time patient seems to be improving at least in her mental status however remains with guarded prognosis given her stroke and left sided weakness.  Morbid obesity with BMI 42.1, dietitian on board, encourage nutrition.  DVT prophylaxis: lovenox. Code Status: DNR. Family Communication: Updated sister at bedside previously. Disposition Plan: Overall mental status much improved.  Continue PT OT ST.  At this point awaiting for skilled nursing facility placement.   Consultants:   Neurology  Palliative care Procedures:  CT head 12/03/2018, 12/04/2018  Plain films of the T-spine 12/04/2018  Chest x-ray 12/08/2018  Plain films of the L-spine 12/04/2018  MRI MRA head 12/04/2018, 12/05/2018  Carotid ultrasound 12/04/2018  MBS 12/06/2018  Abdominal films 12/14/2018 Antimicrobials: Anti-infectives (From admission, onward)   None     Objective: Vitals:   12/19/18 2307 12/20/18 0420 12/20/18 0500 12/20/18 0811  BP: (!) 131/96 127/76  130/84  Pulse: (!) 110 80  80  Resp: 18 20    Temp: 98.9 F (37.2 C) 98.7 F (37.1 C)  98.2 F (36.8 C)  TempSrc: Oral Oral  Oral  SpO2: 98% 98%  98%  Weight:   110.9 kg   Height:        Intake/Output Summary (Last 24 hours) at 12/20/2018 1155 Last data filed at 12/20/2018 0500 Gross per 24 hour  Intake 10 ml  Output  625 ml  Net -615 ml   Filed Weights   12/15/18 0500 12/16/18 0405 12/20/18 0500  Weight: 109.4 kg 110.2 kg 110.9 kg   Weight change:   Body mass index is 43.31 kg/m.  Intake/Output from previous day: 03/14 0701 - 03/15 0700 In: 10 [I.V.:10] Out: 625 [Urine:625] Intake/Output this shift: No intake/output data recorded.  Examination: General exam: Calm, comfortable, obese, not in acute distress, older for age, average built.  HEENT:Oral mucosa moist, Ear/Nose WNL grossly, dentition normal. Respiratory system: Bilateral equal air entry, no crackles and wheezing, no use of accessory muscle, non tender on palpation. Cardiovascular system: regular rate and rhythm, S1 & S2 heard, No JVD/murmurs. Gastrointestinal system: Abdomen soft, non-tender, non-distended, BS +. No hepatosplenomegaly palpable. Nervous System:Alert, awake and oriented at baseline.  Able to only wiggle left foot toes, weak 0/5 LUE, no sensation on the left side.  Extremities: No edema, distal peripheral pulses palpable.  Skin: No rashes,no icterus. MSK: Normal muscle bulk,tone, power    Medications:  Scheduled Meds: . aspirin  325 mg Per Tube Daily   Or  . aspirin  300 mg Rectal Daily  . atorvastatin  40 mg Oral q1800  . diltiazem  60 mg Oral Q6H  . enoxaparin (LOVENOX) injection  40 mg Subcutaneous Q24H  . escitalopram  10 mg Oral Daily  . famotidine  40 mg Oral BID  . feeding supplement (ENSURE ENLIVE)  237 mL Oral Q1500  . iopamidol  100 mL Intravenous Once  . metoprolol tartrate  50 mg Oral BID  . mirtazapine  15 mg Oral QHS  . QUEtiapine  25 mg Oral QHS  . sodium chloride flush  10-40 mL Intracatheter Q12H  . venlafaxine  50 mg Oral BID   Continuous Infusions:   Data Reviewed: I have personally reviewed following labs and imaging studies  CBC: Recent Labs  Lab 12/13/18 1540 12/15/18 0849  WBC  --  8.9  NEUTROABS  --  6.3  HGB 10.6* 10.4*  HCT 33.9* 32.9*  MCV  --  92.2  PLT  --  382    Basic Metabolic Panel: Recent Labs  Lab 12/13/18 1540 12/15/18 0849  NA 139 137  K 4.3 4.1  CL 105 105  CO2 25 24  GLUCOSE 109* 138*  BUN 28* 26*  CREATININE 0.82 0.90  CALCIUM 9.3 8.9   GFR: Estimated Creatinine Clearance: 65.6 mL/min (by C-G formula based on SCr of 0.9 mg/dL). Liver Function Tests: No results for input(s): AST, ALT, ALKPHOS, BILITOT, PROT, ALBUMIN in the last 168 hours. No results for input(s): LIPASE, AMYLASE in the last 168 hours. No results for input(s): AMMONIA in the last 168 hours. Coagulation Profile: No results for input(s): INR, PROTIME in the last 168 hours. Cardiac Enzymes: No results for input(s): CKTOTAL, CKMB, CKMBINDEX, TROPONINI in the last 168 hours. BNP (last 3 results) No results for input(s): PROBNP in the last 8760 hours. HbA1C: No results for input(s): HGBA1C in the last 72 hours. CBG: Recent Labs  Lab 12/19/18 0759 12/19/18 1208 12/19/18 1621 12/19/18 2015 12/20/18 0614  GLUCAP 107* 149* 96 93 103*   Lipid Profile: No results for  input(s): CHOL, HDL, LDLCALC, TRIG, CHOLHDL, LDLDIRECT in the last 72 hours. Thyroid Function Tests: No results for input(s): TSH, T4TOTAL, FREET4, T3FREE, THYROIDAB in the last 72 hours. Anemia Panel: No results for input(s): VITAMINB12, FOLATE, FERRITIN, TIBC, IRON, RETICCTPCT in the last 72 hours. Sepsis Labs: No results for input(s): PROCALCITON, LATICACIDVEN in the last 168 hours.  No results found for this or any previous visit (from the past 240 hour(s)).    Radiology Studies: No results found.    LOS: 16 days   Time spent: More than 50% of that time was spent in counseling and/or coordination of care.  Lanae Boast, MD [facility]  12/20/2018, 11:55 AM

## 2018-12-21 LAB — GLUCOSE, CAPILLARY: Glucose-Capillary: 101 mg/dL — ABNORMAL HIGH (ref 70–99)

## 2018-12-21 MED ORDER — ENSURE ENLIVE PO LIQD
237.0000 mL | Freq: Two times a day (BID) | ORAL | Status: DC
  Administered 2018-12-22 – 2018-12-24 (×4): 237 mL via ORAL

## 2018-12-21 NOTE — Progress Notes (Signed)
Physical Therapy Treatment Patient Details [name] MRN: [id number] DOB: 24-Feb-1946 Today's Date: 12/21/2018    History of Present Illness 73 y.o. female with medical history significant for depression with anxiety, hypertension, atrial fibrillation not anticoagulated due to recurrent GI bleeding, now presenting to the ED from her nursing facility for evaluation of left-sided weakness, dysarthria, and fall. 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 Smaller area of acute infarct right posterior temporal lobe. Pt with recent hospital admission and discharged to SNF on 2/13.   MRI 2/28 shows R BG stroke    PT Comments    Patient progressing with sitting balance and mobilizing to EOB.  Remains safest with transfer to chair via lift, but needs continued input for weight into L LE for awareness and to activate motor response.  Feel she continues to be appropriate for SNF level rehab at d/c.    Follow Up Recommendations  SNF     Equipment Recommendations  None recommended by PT    Recommendations for Other Services       Precautions / Restrictions Precautions Precautions: Fall Precaution Comments: L neglect    Mobility  Bed Mobility Overal bed mobility: Needs Assistance Bed Mobility: Rolling;Sidelying to Sit Rolling: Max assist;+2 for physical assistance Sidelying to sit: Total assist;+2 for physical assistance       General bed mobility comments: rolling to R and assist to initiate, pt reaching for rail with R hand, assist for legs and trunk to sit  Transfers Overall transfer level: Needs assistance   Transfers: Lateral/Scoot Transfers          Lateral/Scoot Transfers: Max assist;+2 physical assistance General transfer comment: scooting up to [facility] to R with +2 A with bed pad, then placed lift pad under pt to transfer to chair via maximove from sitting  Ambulation/Gait                 Stairs             Wheelchair  Mobility    Modified Rankin (Stroke Patients Only) Modified Rankin (Stroke Patients Only) Pre-Morbid Rankin Score: Moderately severe disability Modified Rankin: Severe disability     Balance Overall balance assessment: Needs assistance Sitting-balance support: Feet supported;Single extremity supported Sitting balance-Leahy Scale: Poor Sitting balance - Comments: able to sit about 10 minutes EOB with R UE support to no UE support with S; initially leaning back and to R then corrected over time and with forward reaching to L; patient able to turn head to look to L with assist to locate targets with close S       Standing balance comment: NT                            Cognition Arousal/Alertness: Awake/alert Behavior During Therapy: Flat affect Overall Cognitive Status: Impaired/Different from baseline                     Current Attention Level: Sustained   Following Commands: Follows one step commands consistently;Follows one step commands with increased time     Problem Solving: Decreased initiation General Comments: Oriented this session and able to recall that her balance is much better today      Exercises      General Comments General comments (skin integrity, edema, etc.): Sister in room [name]) encouraged pt to look to L to talk with sister      Pertinent Vitals/Pain Faces  Pain Scale: Hurts a little bit Pain Location: back and L leg Pain Descriptors / Indicators: Discomfort;Aching Pain Intervention(s): Monitored during session;Repositioned    Home Living                      Prior Function            PT Goals (current goals can now be found in the care plan section) Progress towards PT goals: Progressing toward goals    Frequency    Min 3X/week      PT Plan Current plan remains appropriate    Co-evaluation              AM-PAC PT "6 Clicks" Mobility   Outcome Measure  Help needed turning from your back to  your side while in a flat bed without using bedrails?: Total Help needed moving from lying on your back to sitting on the side of a flat bed without using bedrails?: Total Help needed moving to and from a bed to a chair (including a wheelchair)?: Total Help needed standing up from a chair using your arms (e.g., wheelchair or bedside chair)?: Total Help needed to walk in hospital room?: Total Help needed climbing 3-5 steps with a railing? : Total 6 Click Score: 6    End of Session Equipment Utilized During Treatment: Gait belt Activity Tolerance: Patient tolerated treatment well Patient left: in chair;with chair alarm set;with call bell/phone within reach;with family/visitor present Nurse Communication: Mobility status PT Visit Diagnosis: Other abnormalities of gait and mobility (R26.89);Muscle weakness (generalized) (M62.81);History of falling (Z91.81)     Time: 7588-3254 PT Time Calculation (min) (ACUTE ONLY): 34 min  Charges:  $Therapeutic Activity: 23-37 mins                     Sheran Lawless, [place] [facility] [phone] 12/21/2018    Elray Mcgregor 12/21/2018, 4:26 PM

## 2018-12-21 NOTE — Progress Notes (Signed)
Nutrition Follow-up  DOCUMENTATION CODES:   Morbid obesity  INTERVENTION:  Provide Ensure Enlive po BID, each supplement provides 350 kcal and 20 grams of protein.  Encourage adequate PO intake  NUTRITION DIAGNOSIS:   Inadequate oral intake related to acute illness, dysphagia as evidenced by NPO status; diet advanced, improving  GOAL:   Patient will meet greater than or equal to 90% of their needs; progressing  MONITOR:   PO intake, Supplement acceptance, Diet advancement, Labs, Weight trends, I & O's, Skin  REASON FOR ASSESSMENT:   Consult Enteral/tube feeding initiation and management  ASSESSMENT:    73 yo female admitted with acute ischemic CVA with left-sided weakness, dysarthria and dysphagia. PMH includes depression/anxiety, HTN  Tube feeding discontinued 3/13. Cortrak NGT removed. Pt continues on a dysphagia 1 diet with thin liquids. Meal completion has been 20-50%. Mental status has been improving. Pt currently has Ensure ordered and has been consuming them. RD to increase Ensure orders to aid in caloric and protein needs. Labs and medications reviewed.   Diet Order:   Diet Order            DIET - DYS 1 Room service appropriate? Yes; Fluid consistency: Thin  Diet effective now              EDUCATION NEEDS:   Not appropriate for education at this time  Skin:  Skin Assessment: Reviewed RN Assessment  Last BM:  3/14  Height:   Ht Readings from Last 1 Encounters:  12/03/18 5\' 3"  (1.6 m)    Weight:   Wt Readings from Last 1 Encounters:  12/20/18 110.9 kg    Ideal Body Weight:  52.3 kg  BMI:  Body mass index is 43.31 kg/m.  Estimated Nutritional Needs:   Kcal:  1800-2000 kcals   Protein:  90-100 g   Fluid:  >/= 1.8 L    Roslyn Smiling, MS, RD, LDN Pager # [phone] After hours/ weekend pager # [phone]

## 2018-12-21 NOTE — Progress Notes (Signed)
PROGRESS NOTE    Adrienne Stone  ZOX:[id number] DOB: 12-07-1945 DOA: 12/03/2018 PCP: [name], PA-C   Brief Narrative: 73 year old female with history of depression/anxiety, hypertension, A. fib not on anticoagulation due to recurrent GI bleeding, presented from nursing facility with left-sided weakness dysarthria and fall.  In the ER mildly hypertensive, EKG with A. fib, CT head revealed hypoattenuation within the right basal ganglia concerning for acute/early subacute CVA, patient was admitted for further management.  Patient found to have acute ischemic CVA with left-sided flaccid weakness and dysphasia.  Hospital course complicated by lethargy.  At this time patient is much more alert awake, able to participate with PT OT and ST, able to come off cortrak tube feeding. Patient is awaiting for skilled nursing facility placement.   Subjective: Patient seen and examined.No acute events overnight.  Sitting comfortably. Denies nausea vomiting chest pain. Able to wiggle her left toes, unable to move her left upper extremity.  Waiting for nurse assistant help to eat breakfast this morning.  Assessment & Plan:  Acute ischemic stroke of right basal ganglia with a small large area of acute infarct right posterior temporal lobe with left-sided flaccid weakness, dysphagia.  Seen by neurology, completed stroke work-up. w hemoglobin A1c 4.9, LDL 83, TTE on 2/3 w/ EF 60-65%, no source of embolism noted.  Ekg with afib. MRA focal right M1 segment flow loss, narrowing right V4 segment. Carotid Doppler: bilateral ICA w/ 1-39% stenosis; right vertebral artery with high resistant flow.  Continue aspirin 325 mg, Lipitor 40 mg, PT OT and ST.Given her GI bleed, she was felt to be poor candidate for anticoagulation for her A. fib.Patient will need extensive rehabilitation awaiting for placement at this time.  Oropharyngeal dysphagia: MBS 3/1. has cortrak for feeding and off 3/13.  Tolerating diet as per ST on  dysphagia 1 diet with with thin liquid, and being fed with assistance, aspiration precautions.  Hyperlipidemia continue statin.    Normocytic anemia and history of GI bleeding, hemoglobin is stable.  History of GI bleed with last EGD 11/17/2018 with ulcer at the GJ anastomosis but no visible blood vessel.    Persistent atrial fibrillation: Currently rate controlled in A. fib  On cardizem CD and metoprolol.Felt to be poor candidate for anticoagulation due to her recurrent GI bleeding.  Anxiety/depression:on Celexa, Remeron and Seroquel Ativan and venlafaxine.  Given her lethargy,we've cut down remeron to 15 mg, and Seroquel 25 mg, seems to be doing well.  Much more alert awake.    Hypertension: Controlled on Cardizem and metoprolol.    Goals of care palliative care is following closely.  Had extensive discussion with patient and patient's sister, if patient does not do well they are leaning towards palliative care, and  at this time patient seems to be improving at least in her mental status however remains with guarded prognosis given her stroke and left sided weakness.  She will need further skilled nursing rehabilitation.  Morbid obesity with BMI 42.1, dietitian on board, encourage healthy nutrition.  DVT prophylaxis: lovenox. Code Status: DNR. Family Communication: I have updated sister at bedside and over phone previously.  No family at bedside today Disposition Plan: Waiting for skilled nursing facility placement.  Medically stable.    Consultants:   Neurology  Palliative care Procedures:  CT head 12/03/2018, 12/04/2018  Plain films of the T-spine 12/04/2018  Chest x-ray 12/08/2018  Plain films of the L-spine 12/04/2018  MRI MRA head 12/04/2018, 12/05/2018  Carotid ultrasound 12/04/2018  MBS 12/06/2018  Abdominal films 12/14/2018 Antimicrobials: Anti-infectives (From admission, onward)   None     Objective: Vitals:   12/20/18 1212 12/20/18 2019 12/21/18 0034 12/21/18 0400   BP: 125/78 131/63 96/64 (!) 143/95  Pulse: 80 73 82 (!) 57  Resp:  20 20 20   Temp: 98.8 F (37.1 C) 98.9 F (37.2 C) 98.1 F (36.7 C) 97.9 F (36.6 C)  TempSrc: Oral Oral Oral Oral  SpO2: 100% 100% 100% 100%  Weight:      Height:        Intake/Output Summary (Last 24 hours) at 12/21/2018 0839 Last data filed at 12/20/2018 1822 Gross per 24 hour  Intake 120 ml  Output 250 ml  Net -130 ml   Filed Weights   12/15/18 0500 12/16/18 0405 12/20/18 0500  Weight: 109.4 kg 110.2 kg 110.9 kg   Weight change:   Body mass index is 43.31 kg/m.  Intake/Output from previous day: 03/15 0701 - 03/16 0700 In: 120 [P.O.:120] Out: 250 [Urine:250] Intake/Output this shift: No intake/output data recorded.  Examination:  General exam: Calm, comfortable, not in acute distress, older for age, average built.  HEENT:Oral mucosa moist, Ear/Nose WNL grossly, dentition normal. Respiratory system: Bilateral equal air entry, no crackles and wheezing, no use of accessory muscle, non tender on palpation. Cardiovascular system: regular rate and rhythm, S1 & S2 heard, No JVD/murmurs. Gastrointestinal system: Abdomen soft, non-tender, non-distended, BS +. No hepatosplenomegaly palpable. Nervous System:Alert, awake and oriented at baseline.  Able to wiggle left toes, weakness with power 0/ 5 in LUE and LLE Extremities: No edema, distal peripheral pulses palpable.  Skin: No rashes,no icterus. MSK: Normal muscle bulk,tone, power    Medications:  Scheduled Meds: . aspirin  325 mg Per Tube Daily   Or  . aspirin  300 mg Rectal Daily  . atorvastatin  40 mg Oral q1800  . diltiazem  240 mg Oral Daily  . enoxaparin (LOVENOX) injection  40 mg Subcutaneous Q24H  . escitalopram  10 mg Oral Daily  . famotidine  40 mg Oral BID  . feeding supplement (ENSURE ENLIVE)  237 mL Oral Q1500  . iopamidol  100 mL Intravenous Once  . metoprolol tartrate  50 mg Oral BID  . mirtazapine  15 mg Oral QHS  . QUEtiapine   25 mg Oral QHS  . sodium chloride flush  10-40 mL Intracatheter Q12H  . venlafaxine  50 mg Oral BID   Continuous Infusions:   Data Reviewed: I have personally reviewed following labs and imaging studies  CBC: Recent Labs  Lab 12/15/18 0849  WBC 8.9  NEUTROABS 6.3  HGB 10.4*  HCT 32.9*  MCV 92.2  PLT 382   Basic Metabolic Panel: Recent Labs  Lab 12/15/18 0849  NA 137  K 4.1  CL 105  CO2 24  GLUCOSE 138*  BUN 26*  CREATININE 0.90  CALCIUM 8.9   GFR: Estimated Creatinine Clearance: 65.6 mL/min (by C-G formula based on SCr of 0.9 mg/dL). Liver Function Tests: No results for input(s): AST, ALT, ALKPHOS, BILITOT, PROT, ALBUMIN in the last 168 hours. No results for input(s): LIPASE, AMYLASE in the last 168 hours. No results for input(s): AMMONIA in the last 168 hours. Coagulation Profile: No results for input(s): INR, PROTIME in the last 168 hours. Cardiac Enzymes: No results for input(s): CKTOTAL, CKMB, CKMBINDEX, TROPONINI in the last 168 hours. BNP (last 3 results) No results for input(s): PROBNP in the last 8760 hours. HbA1C: No results for input(s): HGBA1C in the last  72 hours. CBG: Recent Labs  Lab 12/19/18 2015 12/20/18 0614 12/20/18 1242 12/20/18 1657 12/20/18 2157  GLUCAP 93 103* 106* 129* 100*   Lipid Profile: No results for input(s): CHOL, HDL, LDLCALC, TRIG, CHOLHDL, LDLDIRECT in the last 72 hours. Thyroid Function Tests: No results for input(s): TSH, T4TOTAL, FREET4, T3FREE, THYROIDAB in the last 72 hours. Anemia Panel: No results for input(s): VITAMINB12, FOLATE, FERRITIN, TIBC, IRON, RETICCTPCT in the last 72 hours. Sepsis Labs: No results for input(s): PROCALCITON, LATICACIDVEN in the last 168 hours.  No results found for this or any previous visit (from the past 240 hour(s)).    Radiology Studies: No results found.    LOS: 17 days   Time spent: More than 50% of that time was spent in counseling and/or coordination of care.  Lanae Boast, MD [facility]  12/21/2018, 8:39 AM

## 2018-12-21 NOTE — Progress Notes (Signed)
Occupational Therapy Treatment Patient Details [name] MRN: [id number] DOB: 08/07/1946 Today's Date: 12/21/2018    History of present illness 73 y.o. female with medical history significant for depression with anxiety, hypertension, atrial fibrillation not anticoagulated due to recurrent GI bleeding, now presenting to the ED from her nursing facility for evaluation of left-sided weakness, dysarthria, and fall. 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 Smaller area of acute infarct right posterior temporal lobe. Pt with recent hospital admission and discharged to SNF on 2/13.   MRI 2/28 shows R BG stroke   OT comments  Patient pleasant and cooperative.  Seated in recliner upon therapist entering room, PT donned resting hand splint prior to therapist entering; noted hand slipping out of splint.  Therapist applied new velcro to better position splint at wrist to avoid slippage and positioned UE on increased supports, but upon return hand slipping proximally again--splint removed and plan for adjustment of webspace to improve fit.  PROM with L UE remains flaccid.  Noted improved visual scanning and decreased time to follow simple 1 step commands, 4/4 accuracy to verbalize numbers on L side in all quadrants.  Attempted clock drawing with patient, requires increased time with illegible numbers (non dominant UE) but verbalizes as writing and appear accurate; although unable to set time.  Will follow.    Follow Up Recommendations  SNF;Supervision/Assistance - 24 hour    Equipment Recommendations  Other (comment)(TBD in next venue of care )    Recommendations for Other Services      Precautions / Restrictions Precautions Precautions: Fall Precaution Comments: L neglect Restrictions Weight Bearing Restrictions: No       Mobility Bed Mobility Overal bed mobility: Needs Assistance Bed Mobility: Rolling;Sidelying to Sit Rolling: Max assist;+2 for physical  assistance Sidelying to sit: Total assist;+2 for physical assistance       General bed mobility comments: OOB upon entry   Transfers Overall transfer level: Needs assistance   Transfers: Lateral/Scoot Transfers          Lateral/Scoot Transfers: Max assist;+2 physical assistance General transfer comment: scooting up to [facility] to R with +2 A with bed pad, then placed lift pad under pt to transfer to chair via maximove from sitting    Balance Overall balance assessment: Needs assistance Sitting-balance support: Feet supported;Single extremity supported Sitting balance-Leahy Scale: Poor Sitting balance - Comments: able to sit about 10 minutes EOB with R UE support to no UE support with S; initially leaning back and to R then corrected over time and with forward reaching to L; patient able to turn head to look to L with assist to locate targets with close S       Standing balance comment: NT                           ADL either performed or assessed with clinical judgement   ADL Overall ADL's : Needs assistance/impaired                                       General ADL Comments: continues to require max-total assist for ADLs; session focused on cognition and splint      Vision   Vision Assessment?: Yes Eye Alignment: Within Functional Limits Ocular Range of Motion: Within Functional Limits Alignment/Gaze Preference: Gaze right Tracking/Visual Pursuits: Able to track stimulus in all  quads without difficulty Additional Comments: continues with R gaze preference, difficult to assess due to cognition but able to correctly verbalize "# seen" on L side 4/4 times    Perception     Praxis      Cognition Arousal/Alertness: Awake/alert Behavior During Therapy: Flat affect Overall Cognitive Status: Impaired/Different from baseline Area of Impairment: Following commands;Memory;Problem solving;Attention                   Current Attention Level:  Sustained Memory: Decreased recall of precautions;Decreased short-term memory Following Commands: Follows one step commands consistently;Follows one step commands with increased time     Problem Solving: Decreased initiation;Requires verbal cues;Slow processing General Comments: able to follow 1 step commands, increased time to draw clock with illegible numbers        Exercises General Exercises - Upper Extremity Shoulder Flexion: PROM;Left;10 reps Shoulder ABduction: PROM;Left;10 reps Shoulder ADduction: PROM;10 reps;Left Elbow Flexion: PROM;Left;10 reps Elbow Extension: PROM;10 reps;Left Wrist Flexion: PROM;10 reps;Left Wrist Extension: PROM;10 reps;Left Digit Composite Flexion: PROM;Left;10 reps Composite Extension: PROM;10 reps;Left Other Exercises Other Exercises: splint check completed again, pt is tending to slide out of splint and believe webbed space is too big.. plan to modify splint for pt; RN notified   Shoulder Instructions       General Comments Sister in room Adrienne Stone) encouraged pt to look to L to talk with sister    Pertinent Vitals/ Pain       Pain Assessment: Faces Faces Pain Scale: No hurt Pain Location: back and L leg Pain Descriptors / Indicators: Discomfort;Aching Pain Intervention(s): Monitored during session;Repositioned  Home Living                                          Prior Functioning/Environment              Frequency  Min 2X/week        Progress Toward Goals  OT Goals(current goals can now be found in the care plan section)  Progress towards OT goals: Progressing toward goals  Acute Rehab OT Goals Patient Stated Goal: none stated today OT Goal Formulation: With patient Time For Goal Achievement: 12/18/18 Potential to Achieve Goals: Fair  Plan Discharge plan remains appropriate;Frequency remains appropriate    Co-evaluation                 AM-PAC OT "6 Clicks" Daily Activity     Outcome  Measure   Help from another person eating meals?: A Lot Help from another person taking care of personal grooming?: A Lot Help from another person toileting, which includes using toliet, bedpan, or urinal?: Total Help from another person bathing (including washing, rinsing, drying)?: A Lot Help from another person to put on and taking off regular upper body clothing?: Total Help from another person to put on and taking off regular lower body clothing?: Total 6 Click Score: 9    End of Session    OT Visit Diagnosis: Muscle weakness (generalized) (M62.81);Hemiplegia and hemiparesis Hemiplegia - Right/Left: Left Hemiplegia - dominant/non-dominant: Dominant Hemiplegia - caused by: Cerebral infarction   Activity Tolerance Patient tolerated treatment well   Patient Left in chair;with call bell/phone within reach;with chair alarm set   Nurse Communication Mobility status;Precautions;Other (comment)(splint)        Time: 1552-0802 OT Time Calculation (min): 28 min  Charges: OT General Charges $OT Visit: 1 Visit OT  Treatments $Therapeutic Activity: 8-22 mins $Neuromuscular Re-education: 8-22 mins  Chancy Milroyhristie S Charvez [place], OT Acute Rehabilitation Services Pager [phone] Office [phone]    Chancy MilroyChristie S Veyda Kaufman 12/21/2018, 5:13 PM

## 2018-12-21 NOTE — Plan of Care (Signed)
Pt is progressing toward expected goal 

## 2018-12-21 NOTE — Progress Notes (Signed)
  Speech Language Pathology Treatment: Dysphagia  Patient Details Name: FLORIDALMA LIAO MRN: [id number] DOB: 02/04/1946 Today's Date: 12/21/2018 Time: 2035-5974 SLP Time Calculation (min) (ACUTE ONLY): 14 min  Assessment / Plan / Recommendation Clinical Impression  Treatment primarily focused on dysphagia goals across pt's meal tray, consisting of Dys 1 solids and thin liquids. Pt needed Mod cues for emergent awareness of anterior spillage on her left side. Min cues were provided for small, single cup sips, which primarily appeared to be tolerated well, although with a single episode of strong, prolonged coughing that occurred when pt became distracted during drinking. Although aspiration with straw sip on MBS was silent, it is possible that this could have been a result of aspiration if volume of aspirated material was larger. However, per chart review, pt remains afebrile and her lung sounds are unchanged, suggestive of overall diet tolerance. Would continue current diet and precautions for now.   HPI HPI: 73 y.o. female with new left sided weakness, unknown time of onset. Neurology Exam reveals left facial droop, left hemiplegia, rightward eye deviation, dysarthria and left hemineglect, consistent with a large right MCA infarction. CT head reveals hypoattenuation within the right basal ganglia involving the caudate nucleus, lentiform nucleus, and posterior limb of internal capsule, compatible with acute/early subacute infarction. MRI shows R BG, small right posterior temporal infarcts.      SLP Plan  Continue with current plan of care       Recommendations  Diet recommendations: Dysphagia 1 (puree);Thin liquid Liquids provided via: Cup;No straw Medication Administration: Crushed with puree Supervision: Full supervision/cueing for compensatory strategies;Staff to assist with self feeding Compensations: Minimize environmental distractions;Slow rate;Small sips/bites Postural Changes  and/or Swallow Maneuvers: Seated upright 90 degrees;Upright 30-60 min after meal                Oral Care Recommendations: Oral care BID Follow up Recommendations: Skilled Nursing facility SLP Visit Diagnosis: Dysphagia, oropharyngeal phase (R13.12) Plan: Continue with current plan of care       GO                Virl Axe Bauer Ausborn 12/21/2018, 4:55 PM  Ivar Drape, M.A. CCC-SLP Acute Herbalist [phone] Office [phone]

## 2018-12-22 LAB — GLUCOSE, CAPILLARY
Glucose-Capillary: 103 mg/dL — ABNORMAL HIGH (ref 70–99)
Glucose-Capillary: 95 mg/dL (ref 70–99)
Glucose-Capillary: 98 mg/dL (ref 70–99)
Glucose-Capillary: 96 mg/dL (ref 70–99)

## 2018-12-22 MED ORDER — DILTIAZEM HCL ER COATED BEADS 240 MG PO CP24
240.0000 mg | ORAL_CAPSULE | Freq: Every day | ORAL | Status: DC
  Administered 2018-12-22 – 2018-12-24 (×3): 240 mg via ORAL
  Filled 2018-12-22 (×3): qty 1

## 2018-12-22 NOTE — Progress Notes (Signed)
PROGRESS NOTE    Adrienne Stone  YTK:[id number] DOB: [birth year] DOA: 12/03/2018 PCP: [name], PA-C   Brief Narrative: 73 year old female with history of depression/anxiety, hypertension, A. fib not on anticoagulation due to recurrent GI bleeding, presented from nursing facility with left-sided weakness dysarthria and fall.  In the ER mildly hypertensive, EKG with A. fib, CT head revealed hypoattenuation within the right basal ganglia concerning for acute/early subacute CVA, patient was admitted for further management.  Patient found to have acute ischemic CVA with left-sided flaccid weakness and dysphasia.  Hospital course complicated by lethargy.  At this time patient is much more alert awake, able to participate with PT OT and ST, able to come off cortrak tube feeding. Patient is awaiting for skilled nursing facility placement.   Subjective: Patient seen and examined.  No new complaints.  Resting comfortably.  she is alert awake oriented  Assessment & Plan:  Acute ischemic stroke of right basal ganglia with a small large area of acute infarct right posterior temporal lobe with left-sided flaccid weakness, dysphagia.  Seen by neurology, completed stroke work-up. w hemoglobin A1c 4.9, LDL 83, TTE on 2/3 w/ EF 60-65%, no source of embolism noted.  Ekg with afib. MRA focal right M1 segment flow loss, narrowing right V4 segment. Carotid Doppler: bilateral ICA w/ 1-39% stenosis; right vertebral artery with high resistant flow.  Continue aspirin 325 mg, Lipitor 40 mg, PT OT and ST.Given her GI bleed, she was felt to be poor candidate for anticoagulation for her A. fib.Patient will need extensive rehabilitation and awaiting for placement at this time.  Oropharyngeal dysphagia: MBS 3/1. has cortrak for feeding and off 3/13.  Tolerating diet as per ST on dysphagia 1 diet with with thin liquid, and being fed with assistance, aspiration precautions.  Hyperlipidemia : Continue statin.     Normocytic anemia and history of GI bleeding, hemoglobin is stable.  History of GI bleed with last EGD 11/17/2018 with ulcer at the GJ anastomosis but no visible blood vessel.    Persistent atrial fibrillation: This am rate was high in low 100s, in A. fib  On cardizem CD and metoprolol. Cont same and monitor and can adjust as needed, she was felt to be poor candidate for anticoagulation due to her recurrent GI bleeding.  Anxiety/depression:on Celexa, Remeron and Seroquel Ativan and venlafaxine.  Given her lethargy, we've cut down remeron to 15 mg, and Seroquel 25 mg, seems to be doing well.  Much more alert awake.    Hypertension: Controlled on Cardizem and metoprolol.    Goals of care palliative care is following closely.  Had extensive discussion with patient and patient's sister, if patient does not do well they are leaning towards palliative care, and  at this time patient seems to be improving at least in her mental status however remains with guarded prognosis given her stroke and left sided weakness.  She will need further skilled nursing rehabilitation.  Morbid obesity with BMI 42.1, dietitian on board, encourage healthy nutrition.  DVT prophylaxis: lovenox. Code Status: DNR. Family Communication: I have updated sister at bedside and over phone previously.  No family at bedside today Disposition Plan: Waiting for skilled nursing facility placement.  Medically stable.    Consultants:   Neurology  Palliative care Procedures:  CT head 12/03/2018, 12/04/2018  Plain films of the T-spine 12/04/2018  Chest x-ray 12/08/2018  Plain films of the L-spine 12/04/2018  MRI MRA head 12/04/2018, 12/05/2018  Carotid ultrasound 12/04/2018  MBS 12/06/2018  Abdominal  films 12/14/2018 Antimicrobials: Anti-infectives (From admission, onward)   None     Objective: Vitals:   12/21/18 2328 12/22/18 0413 12/22/18 0831 12/22/18 1215  BP: 128/86 (!) 155/53 (!) 148/93 (!) 149/72  Pulse: 76 96 67 (!)  57  Resp: Temp: 99.1 F (37.3 C) 98.3 F (36.8 C) (!) 97.5 F (36.4 C) 99.2 F (37.3 C)  TempSrc: Oral Axillary Oral Oral  SpO2:  99% 98% 98%  Weight:      Height:        Intake/Output Summary (Last 24 hours) at 12/22/2018 1330 Last data filed at 12/22/2018 1311 Gross per 24 hour  Intake 300 ml  Output 750 ml  Net -450 ml   Filed Weights   12/15/18 0500 12/16/18 0405 12/20/18 0500  Weight: 109.4 kg 110.2 kg 110.9 kg   Weight change:   Body mass index is 43.31 kg/m.  Intake/Output from previous day: 03/16 0701 - 03/17 0700 In: 600 [P.O.:600] Out: 400 [Urine:400] Intake/Output this shift: Total I/O In: 60 [P.O.:60] Out: 350 [Urine:350]  Examination:  General exam: Calm, comfortable, obese, not in acute distress, older for age, average built.  HEENT:Oral mucosa moist, Ear/Nose WNL grossly, dentition normal. Respiratory system: Bilateral equal air entry, no crackles and wheezing, no use of accessory muscle, non tender on palpation. Cardiovascular system: regular rate and rhythm, S1 & S2 heard, No JVD/murmurs. Gastrointestinal system: Abdomen soft, non-tender, non-distended, BS +. No hepatosplenomegaly palpable. Nervous System:Alert, awake and oriented at baseline.  Able to move her right side, weakness on the left side and only able to wiggle her left toes Extremities: No edema, distal peripheral pulses palpable.  Skin: No rashes,no icterus. MSK: Normal muscle bulk,tone, power   Medications:  Scheduled Meds: . aspirin  325 mg Per Tube Daily   Or  . aspirin  300 mg Rectal Daily  . atorvastatin  40 mg Oral q1800  . diltiazem  240 mg Oral Daily  . enoxaparin (LOVENOX) injection  40 mg Subcutaneous Q24H  . escitalopram  10 mg Oral Daily  . famotidine  40 mg Oral BID  . feeding supplement (ENSURE ENLIVE)  237 mL Oral BID BM  . iopamidol  100 mL Intravenous Once  . metoprolol tartrate  50 mg Oral BID  . mirtazapine  15 mg Oral QHS  . QUEtiapine  25 mg  Oral QHS  . sodium chloride flush  10-40 mL Intracatheter Q12H  . venlafaxine  50 mg Oral BID   Continuous Infusions:   Data Reviewed: I have personally reviewed following labs and imaging studies  CBC: No results for input(s): WBC, NEUTROABS, HGB, HCT, MCV, PLT in the last 168 hours. Basic Metabolic Panel: No results for input(s): NA, K, CL, CO2, GLUCOSE, BUN, CREATININE, CALCIUM, MG, PHOS in the last 168 hours. GFR: Estimated Creatinine Clearance: 65.6 mL/min (by C-G formula based on SCr of 0.9 mg/dL). Liver Function Tests: No results for input(s): AST, ALT, ALKPHOS, BILITOT, PROT, ALBUMIN in the last 168 hours. No results for input(s): LIPASE, AMYLASE in the last 168 hours. No results for input(s): AMMONIA in the last 168 hours. Coagulation Profile: No results for input(s): INR, PROTIME in the last 168 hours. Cardiac Enzymes: No results for input(s): CKTOTAL, CKMB, CKMBINDEX, TROPONINI in the last 168 hours. BNP (last 3 results) No results for input(s): PROBNP in the last 8760 hours. HbA1C: No results for input(s): HGBA1C in the last 72 hours. CBG: Recent Labs  Lab 12/20/18 1657 12/20/18 2157 12/21/18  2238 12/22/18 0640 12/22/18 1206  GLUCAP 129* 100* 101* 103* 95   Lipid Profile: No results for input(s): CHOL, HDL, LDLCALC, TRIG, CHOLHDL, LDLDIRECT in the last 72 hours. Thyroid Function Tests: No results for input(s): TSH, T4TOTAL, FREET4, T3FREE, THYROIDAB in the last 72 hours. Anemia Panel: No results for input(s): VITAMINB12, FOLATE, FERRITIN, TIBC, IRON, RETICCTPCT in the last 72 hours. Sepsis Labs: No results for input(s): PROCALCITON, LATICACIDVEN in the last 168 hours.  No results found for this or any previous visit (from the past 240 hour(s)).    Radiology Studies: No results found.    LOS: 18 days   Time spent: More than 50% of that time was spent in counseling and/or coordination of care.  Lanae Boast, MD [facility]  12/22/2018, 1:30 PM

## 2018-12-23 LAB — GLUCOSE, CAPILLARY
GLUCOSE-CAPILLARY: 94 mg/dL (ref 70–99)
Glucose-Capillary: 98 mg/dL (ref 70–99)
Glucose-Capillary: 107 mg/dL — ABNORMAL HIGH (ref 70–99)
Glucose-Capillary: 94 mg/dL (ref 70–99)
Glucose-Capillary: 100 mg/dL — ABNORMAL HIGH (ref 70–99)

## 2018-12-23 NOTE — Progress Notes (Signed)
Occupational Therapy Treatment Patient Details [name] MRN: [id number] DOB: [birth year] Today's Date: 12/23/2018    History of present illness 73 y.o. female with medical history significant for depression with anxiety, hypertension, atrial fibrillation not anticoagulated due to recurrent GI bleeding, now presenting to the ED from her nursing facility for evaluation of left-sided weakness, dysarthria, and fall. 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 Smaller area of acute infarct right posterior temporal lobe. Pt with recent hospital admission and discharged to SNF on 2/13.   MRI 2/28 shows R BG stroke   OT comments  Pt engaged in cotx with OT/PT.  Continues to require increased cueing to locate items on L side and follow multiple step commands (ie look at the therapist towards the left), but able to locate therapist on L side when addressing her name. Initiated educated on UE hemi dressing with gown, pt with increasing awareness of deficits as she reports "my arm doesn't work". Min-mod assist EOB during grooming tasks, then transferred to recliner using sara plus stedy with total +2 assist.  DC plan remains appropriative.  Plan for splint adjustment next session.    Follow Up Recommendations  SNF;Supervision/Assistance - 24 hour    Equipment Recommendations  Other (comment)(TBD in next venue of care)    Recommendations for Other Services      Precautions / Restrictions Precautions Precautions: Fall Precaution Comments: L neglect Restrictions Weight Bearing Restrictions: No       Mobility Bed Mobility Overal bed mobility: Needs Assistance Bed Mobility: Rolling;Sidelying to Sit Rolling: Min assist Sidelying to sit: Mod assist       General bed mobility comments: Pt performs rolling to L side with min A and use of bed rails. Pt requiers mod A for BLE negotiation over EOB.   Transfers Overall transfer level: Needs assistance Equipment used:  Rolling walker (2 wheeled)             General transfer comment: Pt tolerated standing via the sar lift plus for ~2 minutes before being transferred to chair.     Balance Overall balance assessment: Needs assistance Sitting-balance support: Feet supported;Single extremity supported Sitting balance-Leahy Scale: Poor Sitting balance - Comments: able to sit about 10 minutes EOB with R UE support to no UE support with S; initially leaning back and to R then corrected over time and with forward reaching to L; patient able to turn head to look to L with assist to locate targets with close S   Standing balance support: During functional activity;Bilateral upper extremity supported Standing balance-Leahy Scale: Poor                             ADL either performed or assessed with clinical judgement   ADL Overall ADL's : Needs assistance/impaired     Grooming: Wash/dry face;Oral care;Sitting;Minimal assistance Grooming Details (indicate cue type and reason): sitting EOB with min-mod assist, requires max cueing to locate toothbrush (towards L side), but then able to complete task with min assist to sequence and problem solve needs         Upper Body Dressing : Moderate assistance;Sitting Upper Body Dressing Details (indicate cue type and reason): donning gown, hand over hand to thread L UE and education began on hemi dressing techniques; able to thread R UE without assist      Toilet Transfer: Total assistance;+2 for physical assistance;+2 for safety/equipment(using sara plus lift ) Toilet Transfer Details (  indicate cue type and reason): simulated to recliner                  Vision   Additional Comments: L inattention, able to scan with increased ease towards L side but continues to have difficulty scanning and locating items    Perception     Praxis      Cognition Arousal/Alertness: Awake/alert Behavior During Therapy: Flat affect Overall Cognitive Status:  Impaired/Different from baseline Area of Impairment: Following commands;Memory;Problem solving;Attention;Awareness                   Current Attention Level: Sustained Memory: Decreased recall of precautions;Decreased short-term memory Following Commands: Follows one step commands consistently;Follows one step commands with increased time   Awareness: Emergent Problem Solving: Decreased initiation;Requires verbal cues;Slow processing;Difficulty sequencing General Comments: pt with increased awareness to deficits, reporting "I can't move or feel my arm", continues to have difficlty following multiple step commands as able to look towards L but does not when asked "Can you look at me?:         Exercises General Exercises - Upper Extremity Shoulder Flexion: PROM;Left;5 reps Shoulder ABduction: PROM;5 reps;Left Elbow Flexion: PROM;Left;5 reps Elbow Extension: PROM;Left;5 reps Wrist Flexion: PROM;Left;5 reps Wrist Extension: PROM;Left;5 reps Digit Composite Flexion: PROM;Left;5 reps Composite Extension: PROM;Left;5 reps General Exercises - Lower Extremity Ankle Circles/Pumps: AROM;PROM;Both;Seated;10 reps((Pt remains flaccid on L side) Heel Slides: AROM;10 reps;Supine;PROM;Both((Pt remains flaccid on L side) Hip ABduction/ADduction: AROM;Seated;Both;PROM;10 reps((Pt remains flaccid on L side)   Shoulder Instructions       General Comments sister arrived during session, setup sister in chair on L side of patient while seated in chair '    Pertinent Vitals/ Pain       Pain Assessment: 0-10 Pain Score: 6  Pain Location: back and L leg Pain Descriptors / Indicators: Discomfort;Aching Pain Intervention(s): Monitored during session;Repositioned  Home Living                                          Prior Functioning/Environment              Frequency  Min 2X/week        Progress Toward Goals  OT Goals(current goals can now be found in the care  plan section)  Progress towards OT goals: Progressing toward goals  Acute Rehab OT Goals Patient Stated Goal: get into chair OT Goal Formulation: With patient Time For Goal Achievement: 12/18/18 Potential to Achieve Goals: Fair  Plan Discharge plan remains appropriate;Frequency remains appropriate    Co-evaluation    PT/OT/SLP Co-Evaluation/Treatment: Yes Reason for Co-Treatment: Complexity of the patient's impairments (multi-system involvement) PT goals addressed during session: Mobility/safety with mobility;Strengthening/ROM OT goals addressed during session: ADL's and self-care;Other (comment)(mobility)      AM-PAC OT "6 Clicks" Daily Activity     Outcome Measure   Help from another person eating meals?: A Lot Help from another person taking care of personal grooming?: A Little Help from another person toileting, which includes using toliet, bedpan, or urinal?: Total Help from another person bathing (including washing, rinsing, drying)?: A Lot Help from another person to put on and taking off regular upper body clothing?: A Lot Help from another person to put on and taking off regular lower body clothing?: Total 6 Click Score: 11    End of Session    OT Visit Diagnosis: Muscle weakness (  generalized) (M62.81);Hemiplegia and hemiparesis Hemiplegia - Right/Left: Left Hemiplegia - dominant/non-dominant: Dominant Hemiplegia - caused by: Cerebral infarction   Activity Tolerance Patient tolerated treatment well   Patient Left in chair;with call bell/phone within reach;with chair alarm set   Nurse Communication Mobility status        Time: 3810-1751 OT Time Calculation (min): 25 min  Charges: OT General Charges $OT Visit: 1 Visit OT Treatments $Self Care/Home Management : 8-22 mins  Chancy Milroy, OT Acute Rehabilitation Services Pager [phone] Office [phone]    Chancy Milroy 12/23/2018, 12:38 PM

## 2018-12-23 NOTE — Progress Notes (Signed)
CSW sent updated therapy notes to [facility] [place] [facility] to initiate new insurance authorization request through [employer].  Blenda Nicely, [place] Clinical Social Worker [phone]

## 2018-12-23 NOTE — Progress Notes (Addendum)
PROGRESS NOTE    [name]  HTX:[id number] DOB: 1946/08/24 DOA: 12/03/2018 PCP: Adrienne Lent, PA-C   Brief Narrative:  HPI On 12/03/2018 Adrienne Stone is a 73 y.o. female with medical history significant for depression with anxiety, hypertension, atrial fibrillation not anticoagulated due to recurrent GI bleeding, now presenting to the emergency department from her nursing facility for evaluation of left-sided weakness, dysarthria, and fall.  Patient is accompanied by her sister who assists with the history.  Nursing facility personnel reported that the patient fell at approximately 1:30 PM, but had told the patient's sister that she fell at approximately 5 PM.  Around the same time, she was noted to have a new speech difficulty and was not moving her left side very well.  EMS was called and she was brought into the ED for evaluation of this.  Patient's ability to provide a history is limited by her severe dysarthria.  Her sister was with her yesterday, she seemed to be in her usual state at that time, and was not complaining of anything.  Interim history   Admitted for acute CVA with left-sided weakness and dysphagia.  Neurology was consulted and appreciated.  Patient is improving and currently awaiting SNF placement. Assessment & Plan   Acute ischemic stroke -Presented with left-sided flaccid weakness and dysphasia -CT head 2/27: Attenuation right basal ganglia, early infarct -CT head 2/28: Right basal/IC infarct.  rM1 thromboembolism. -MRI: Right basal ganglia infarcts.  Small right posterior temporal lobe infarct.  FLAIR hyperintensity R M2.  rM1 patent. -MRA focal right M1 segment flow loss, narrowing right V4 segment -Carotid Doppler bilateral ICA with 1-39 percent stenosis, right vertebral artery with high resistant flow -Neurology consulted and appreciated -Hemoglobin A1c 4.9, LDL 83 -Echocardiogram showed an EF of 60 to 65%, no embolism noted -EKG showing atrial  fibrillation -Patient currently on aspirin and statin -Given her history of GI bleed she is felt to be a poor candidate for anticoagulation for her atrial fibrillation -PT, OT recommending SNF  Oropharyngeal dysphagia -Patient did have MBS on 12/06/2018 and required core track for feeding, which is discontinued -Speech therapy consulted and patient currently advanced to dysphagia 1 diet with thin liquids.  Being fed with assistance -Continue aspiration precautions  Hyperlipidemia -Continue statin  Normocytic anemia with history of GI bleeding -Hemoglobin currently stable  -Patient had EGD on 11/17/2018 with ulcer at the JG anastomosis but no visible blood vessel  Persistent atrial fibrillation -Continue Cardizem and metoprolol -As above given her history of GI bleed, patient is a poor candidate for anticoagulation  Anxiety/depression -Continue Celexa, Remeron, Seroquel, Ativan, venlafaxine -Remeron and Seroquel doses have been reduced  Essential hypertension -Continue Cardizem and metoprolol  Goals of care -Palliative care consulted and appreciated, patient currently DNR  Morbid obesity -BMI 42  DVT Prophylaxis  lovenox  Code Status: DNR  Family Communication: None at bedside  Disposition Plan: Admitted. Pending SNF  Consultants Neurology Palliative care  Procedures  Carotid Doppler  Antibiotics   Anti-infectives (From admission, onward)   None      Subjective:   Adrienne Stone seen and examined today.  Has no complaints this morning.  Denies current chest pain or shortness of breath, abdominal pain.   Objective:   Vitals:   12/22/18 2350 12/23/18 0313 12/23/18 0500 12/23/18 0753  BP: (!) 104/56 113/80  125/80  Pulse: 72 74  90  Resp: (!) 21 20  16   Temp: 98.7 F (37.1 C) 98.4 F (36.9 C)  98.6 F (37 C)  TempSrc: Oral Oral  Oral  SpO2: 100% 99%  96%  Weight:   106 kg   Height:        Intake/Output Summary (Last 24 hours) at 12/23/2018  1114 Last data filed at 12/23/2018 4098 Gross per 24 hour  Intake 635 ml  Output 600 ml  Net 35 ml   Filed Weights   12/16/18 0405 12/20/18 0500 12/23/18 0500  Weight: 110.2 kg 110.9 kg 106 kg    Exam  General: Well developed, chronically ill appearing, NAD  HEENT: NCAT, mucous membranes moist.   Neck: Supple  Cardiovascular: S1 S2 auscultated, irregular  Respiratory: Clear to auscultation bilaterally  Abdomen: Soft, obese, nontender, nondistended, + bowel sounds  Extremities: warm dry without cyanosis clubbing or edema  Neuro: AAOx2 (place, self), left sided weakness  Psych: pleasant, appropriate mood and affect   Data Reviewed: I have personally reviewed following labs and imaging studies  CBC: No results for input(s): WBC, NEUTROABS, HGB, HCT, MCV, PLT in the last 168 hours. Basic Metabolic Panel: No results for input(s): NA, K, CL, CO2, GLUCOSE, BUN, CREATININE, CALCIUM, MG, PHOS in the last 168 hours. GFR: Estimated Creatinine Clearance: 63.9 mL/min (by C-G formula based on SCr of 0.9 mg/dL). Liver Function Tests: No results for input(s): AST, ALT, ALKPHOS, BILITOT, PROT, ALBUMIN in the last 168 hours. No results for input(s): LIPASE, AMYLASE in the last 168 hours. No results for input(s): AMMONIA in the last 168 hours. Coagulation Profile: No results for input(s): INR, PROTIME in the last 168 hours. Cardiac Enzymes: No results for input(s): CKTOTAL, CKMB, CKMBINDEX, TROPONINI in the last 168 hours. BNP (last 3 results) No results for input(s): PROBNP in the last 8760 hours. HbA1C: No results for input(s): HGBA1C in the last 72 hours. CBG: Recent Labs  Lab 12/22/18 1206 12/22/18 1704 12/22/18 2119 12/23/18 0614 12/23/18 0721  GLUCAP 95 98 96 98 94   Lipid Profile: No results for input(s): CHOL, HDL, LDLCALC, TRIG, CHOLHDL, LDLDIRECT in the last 72 hours. Thyroid Function Tests: No results for input(s): TSH, T4TOTAL, FREET4, T3FREE, THYROIDAB in  the last 72 hours. Anemia Panel: No results for input(s): VITAMINB12, FOLATE, FERRITIN, TIBC, IRON, RETICCTPCT in the last 72 hours. Urine analysis:    Component Value Date/Time   COLORURINE YELLOW 12/04/2018 0110   APPEARANCEUR CLEAR 12/04/2018 0110   LABSPEC 1.008 12/04/2018 0110   PHURINE 5.0 12/04/2018 0110   GLUCOSEU NEGATIVE 12/04/2018 0110   HGBUR NEGATIVE 12/04/2018 0110   BILIRUBINUR NEGATIVE 12/04/2018 0110   KETONESUR NEGATIVE 12/04/2018 0110   PROTEINUR NEGATIVE 12/04/2018 0110   NITRITE NEGATIVE 12/04/2018 0110   LEUKOCYTESUR NEGATIVE 12/04/2018 0110   Sepsis Labs: (procalcitonin:4,lacticidven:4)  )No results found for this or any previous visit (from the past 240 hour(s)).    Radiology Studies: No results found.   Scheduled Meds: . aspirin  325 mg Per Tube Daily   Or  . aspirin  300 mg Rectal Daily  . atorvastatin  40 mg Oral q1800  . diltiazem  240 mg Oral Daily  . enoxaparin (LOVENOX) injection  40 mg Subcutaneous Q24H  . escitalopram  10 mg Oral Daily  . famotidine  40 mg Oral BID  . feeding supplement (ENSURE ENLIVE)  237 mL Oral BID BM  . iopamidol  100 mL Intravenous Once  . metoprolol tartrate  50 mg Oral BID  . mirtazapine  15 mg Oral QHS  . QUEtiapine  25 mg Oral QHS  . sodium  chloride flush  10-40 mL Intracatheter Q12H  . venlafaxine  50 mg Oral BID   Continuous Infusions:   LOS: 19 days   Time Spent in minutes   30 minutes  Karman Veney D.O. on 12/23/2018 at 11:14 AM  Between 7am to 7pm - Please see pager noted on [email or web address]  After 7pm go to [email or web address]  And look for the night coverage person covering for me after hours  [facility] Office  [phone]

## 2018-12-23 NOTE — Progress Notes (Signed)
While patient was alone in room she attempted to pull her eating tray over, she now has a skin tear to the middle of her chest. Area cleaned and opsite placed on it.

## 2018-12-23 NOTE — Progress Notes (Signed)
  Speech Language Pathology Treatment: Dysphagia;Cognitive-Linquistic  Patient Details [name] MRN: [id number] DOB: 03-Jun-1946 Today's Date: 12/23/2018 Time: 2500-3704 SLP Time Calculation (min) (ACUTE ONLY): 50 min  Assessment / Plan / Recommendation Clinical Impression  SLP engaged pt in upgraded po textures trials and basic functional task of brushing teeth. Pt able to tolerate sips of thin liquids from a cup without anterior spill though immediate hard cough intermittently present, likely indicative of improving laryngeal sensation since MBS. No adverse signs of intolerance of diet, pt may continue thin liquids with appropriate precautions. She is also able to masticate soft foods with assist for feeding and is anxious for upgraded diet as intake has been poor. Diet changed to dys 3/thin.  Pt required max visual and verbal cues to direct attention to midline while locating objects. Multiple rapid repitition needed for pt to follow one step commands to sequence task as she is highly distractible, perseverative and displays characteristics of ideational apraxia. Will continue to follow acutely.   HPI HPI: 73 y.o. female with new left sided weakness, unknown time of onset. Neurology Exam reveals left facial droop, left hemiplegia, rightward eye deviation, dysarthria and left hemineglect, consistent with a large right MCA infarction. CT head reveals hypoattenuation within the right basal ganglia involving the caudate nucleus, lentiform nucleus, and posterior limb of internal capsule, compatible with acute/early subacute infarction. MRI shows R BG, small right posterior temporal infarcts.      SLP Plan  Continue with current plan of care       Recommendations  Diet recommendations: Dysphagia 3 (mechanical soft);Thin liquid Liquids provided via: Cup;No straw Medication Administration: Crushed with puree Supervision: Full supervision/cueing for compensatory strategies;Staff to  assist with self feeding Compensations: Minimize environmental distractions;Slow rate;Small sips/bites Postural Changes and/or Swallow Maneuvers: Seated upright 90 degrees;Upright 30-60 min after meal                Oral Care Recommendations: Oral care BID Follow up Recommendations: Skilled Nursing facility SLP Visit Diagnosis: Dysphagia, oropharyngeal phase (R13.12) Plan: Continue with current plan of care       GO               [name], MA CCC-SLP  Acute Rehabilitation Services Pager [phone] Office [phone]  Claudine Mouton 12/23/2018, 2:45 PM

## 2018-12-23 NOTE — Care Management Important Message (Signed)
Important Message  Patient Details  [name] MRN: [id number] Date of Birth: 1946-04-12   Medicare Important Message Given:  Yes    Dorena Bodo 12/23/2018, 11:27 AM

## 2018-12-23 NOTE — Progress Notes (Signed)
CSW following for discharge plan. Per MD, patient more alert and interactive today. Awaiting updated therapy notes to initiate new authorization with [facility].   CSW to follow.  Blenda Nicely, [place] Clinical Social Worker [phone]

## 2018-12-23 NOTE — Progress Notes (Signed)
Physical Therapy Treatment Patient Details [name] MRN: [id number] DOB: [birth year] Today's Date: 12/23/2018    History of Present Illness 73 y.o. female with medical history significant for depression with anxiety, hypertension, atrial fibrillation not anticoagulated due to recurrent GI bleeding, now presenting to the ED from her nursing facility for evaluation of left-sided weakness, dysarthria, and fall. 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 Smaller area of acute infarct right posterior temporal lobe. Pt with recent hospital admission and discharged to SNF on 2/13.   MRI 2/28 shows R BG stroke    PT Comments    PT co-treat with Lorene Dy (OT). Pt much more alert than prior treatments but remains oriented to just herself. Pt able to perform bed mobility with min to mod A when going to the L side. Pt tolerated EOB for several minutes to perform functional tasks with OT. Pt requires min guard to min A at EOB with max cues to help with balance and hand placement, and is a total A for any transfers. Plan to d/c to SNF remains appropriate at this time based on current progress. Plan to progress pt with transfer training and OOB activities.   Follow Up Recommendations  SNF     Equipment Recommendations  None recommended by PT    Recommendations for Other Services       Precautions / Restrictions Precautions Precautions: Fall Precaution Comments: L neglect    Mobility  Bed Mobility Overal bed mobility: Needs Assistance Bed Mobility: Rolling;Sidelying to Sit Rolling: Min assist Sidelying to sit: Mod assist       General bed mobility comments: Pt performs rolling to L side with min A and use of bed rails. Pt requiers mod A for BLE negotiation over EOB.   Transfers Overall transfer level: Needs assistance               General transfer comment: Pt tolerated standing via the sara lift plus for ~2 minutes before being transferred to chair. Pt  initially able to fully extend hips and promote BLE weight bearing with use of sara stedy plus, before pt fatigued and began to buckle more at the knees. Pt grip strength strong with RUE and required clinician assistance with LUE to maintain LUE placement/hand hold.   Ambulation/Gait             General Gait Details: unable at this time   Stairs             Wheelchair Mobility    Modified Rankin (Stroke Patients Only)       Balance Overall balance assessment: Needs assistance Sitting-balance support: Feet supported;Single extremity supported Sitting balance-Leahy Scale: Poor Sitting balance - Comments: able to sit about 10 minutes EOB with R UE support to no UE support with S; initially leaning back and to R then corrected over time and with forward reaching to L; patient able to turn head to look to L with assist to locate targets with close S   Standing balance support: During functional activity;Bilateral upper extremity supported Standing balance-Leahy Scale: Poor                              Cognition Arousal/Alertness: Awake/alert Behavior During Therapy: Flat affect Overall Cognitive Status: Impaired/Different from baseline  Exercises General Exercises - Lower Extremity Ankle Circles/Pumps: AROM;PROM;Both;Seated;10 reps((Pt remains flaccid on L side) Heel Slides: AROM;10 reps;Supine;PROM;Both((Pt remains flaccid on L side) Hip ABduction/ADduction: AROM;Seated;Both;PROM;10 reps((Pt remains flaccid on L side)    General Comments        Pertinent Vitals/Pain Pain Score: 6  Pain Location: back and L leg Pain Descriptors / Indicators: Discomfort;Aching    Home Living                      Prior Function            PT Goals (current goals can now be found in the care plan section) Acute Rehab PT Goals Patient Stated Goal: get into chair PT Goal Formulation: With patient Time  For Goal Achievement: 12/25/18 Potential to Achieve Goals: Fair    Frequency    Min 3X/week      PT Plan Current plan remains appropriate    Co-evaluation PT/OT/SLP Co-Evaluation/Treatment: Yes Reason for Co-Treatment: Complexity of the patient's impairments (multi-system involvement);Necessary to address cognition/behavior during functional activity;For patient/therapist safety;To address functional/ADL transfers PT goals addressed during session: Mobility/safety with mobility;Strengthening/ROM OT goals addressed during session: ADL's and self-care      AM-PAC PT "6 Clicks" Mobility   Outcome Measure  Help needed turning from your back to your side while in a flat bed without using bedrails?: A Lot Help needed moving from lying on your back to sitting on the side of a flat bed without using bedrails?: Total Help needed moving to and from a bed to a chair (including a wheelchair)?: Total Help needed standing up from a chair using your arms (e.g., wheelchair or bedside chair)?: Total Help needed to walk in hospital room?: Total Help needed climbing 3-5 steps with a railing? : Total 6 Click Score: 7    End of Session Equipment Utilized During Treatment: Gait belt(sara lift plus) Activity Tolerance: Patient tolerated treatment well Patient left: in chair;with chair alarm set;with call bell/phone within reach;with family/visitor present Nurse Communication: Mobility status PT Visit Diagnosis: Other abnormalities of gait and mobility (R26.89);Muscle weakness (generalized) (M62.81);History of falling (Z91.81)     Time: 1100-1135 PT Time Calculation (min) (ACUTE ONLY): 35 min  Charges:  $Therapeutic Activity: 8-22 mins                     Margarita Mail, SPTA   Margarita Mail 12/23/2018, 11:51 AM

## 2018-12-24 LAB — GLUCOSE, CAPILLARY
GLUCOSE-CAPILLARY: 117 mg/dL — AB (ref 70–99)
GLUCOSE-CAPILLARY: 116 mg/dL — AB (ref 70–99)
GLUCOSE-CAPILLARY: 91 mg/dL (ref 70–99)
Glucose-Capillary: 87 mg/dL (ref 70–99)

## 2018-12-24 MED ORDER — BOOST / RESOURCE BREEZE PO LIQD CUSTOM
1.0000 | Freq: Three times a day (TID) | ORAL | Status: DC
  Administered 2018-12-24 – 2018-12-25 (×2): 1 via ORAL

## 2018-12-24 NOTE — Progress Notes (Signed)
  Speech Language Pathology Treatment: Dysphagia;Cognitive-Linquistic  Patient Details [name] MRN: [id number] DOB: 07/17/1946 Today's Date: 12/24/2018 Time: 1740-8144 SLP Time Calculation (min) (ACUTE ONLY): 40 min  Assessment / Plan / Recommendation Clinical Impression  Pt, alert and upright in bed, observed with dys 3, thin liquid breakfast this am. With SLP at mid visual field, pt required mod verbal/visual cues to navigate to L side of tray and find desired food/drink items. She displayed adequate sustained attention during meal time, which is an improvement from last SLP session (3/18), but this could be due to better time of day where pt is less fatigued. She tolerated all POs, with no s/sx of aspiration (coughing/throat clearing) as has been observed in the past. During mealtime, SLP provided mod verbal cues for pt to increase vocal volume in order to improve speech intelligibilty. She independently used the strategy of overarticulation following cues to increase vocal intensity and stated that she is trying to articulate words more. Recommend continuation of dys 3, thin diet with full supervision to help pt navigate tray and assist in self feeding. Will f/u for tolerance and to address cognitive-linguistic needs.    HPI HPI: 73 y.o. female with new left sided weakness, unknown time of onset. Neurology Exam reveals left facial droop, left hemiplegia, rightward eye deviation, dysarthria and left hemineglect, consistent with a large right MCA infarction. CT head reveals hypoattenuation within the right basal ganglia involving the caudate nucleus, lentiform nucleus, and posterior limb of internal capsule, compatible with acute/early subacute infarction. MRI shows R BG, small right posterior temporal infarcts.      SLP Plan  Continue with current plan of care       Recommendations  Diet recommendations: Dysphagia 3 (mechanical soft);Thin liquid Liquids provided via: Cup;No  straw Medication Administration: Crushed with puree Supervision: Full supervision/cueing for compensatory strategies;Staff to assist with self feeding Compensations: Minimize environmental distractions;Slow rate;Small sips/bites Postural Changes and/or Swallow Maneuvers: Seated upright 90 degrees;Upright 30-60 min after meal                Oral Care Recommendations: Oral care BID Follow up Recommendations: Skilled Nursing facility SLP Visit Diagnosis: Dysphagia, oropharyngeal phase (R13.12) Plan: Continue with current plan of care       GO                Gardiner Ramus, Student SLP 12/24/2018, 10:11 AM

## 2018-12-24 NOTE — Progress Notes (Signed)
PROGRESS NOTE    Adrienne Stone  XLK:[id number] DOB: [birth year] DOA: 12/03/2018 PCP: [name], PA-C   Brief Narrative:  HPI On 12/03/2018 Adrienne Stone is a 73 y.o. female with medical history significant for depression with anxiety, hypertension, atrial fibrillation not anticoagulated due to recurrent GI bleeding, now presenting to the emergency department from her nursing facility for evaluation of left-sided weakness, dysarthria, and fall.  Patient is accompanied by her sister who assists with the history.  Nursing facility personnel reported that the patient fell at approximately 1:30 PM, but had told the patient's sister that she fell at approximately 5 PM.  Around the same time, she was noted to have a new speech difficulty and was not moving her left side very well.  EMS was called and she was brought into the ED for evaluation of this.  Patient's ability to provide a history is limited by her severe dysarthria.  Her sister was with her yesterday, she seemed to be in her usual state at that time, and was not complaining of anything.  Interim history   Admitted for acute CVA with left-sided weakness and dysphagia.  Neurology was consulted and appreciated.  Patient is improving and currently awaiting SNF placement. Assessment & Plan   Acute ischemic stroke -Presented with left-sided flaccid weakness and dysphasia -CT head 2/27: Attenuation right basal ganglia, early infarct -CT head 2/28: Right basal/IC infarct.  rM1 thromboembolism. -MRI: Right basal ganglia infarcts.  Small right posterior temporal lobe infarct.  FLAIR hyperintensity R M2.  rM1 patent. -MRA focal right M1 segment flow loss, narrowing right V4 segment -Carotid Doppler bilateral ICA with 1-39 percent stenosis, right vertebral artery with high resistant flow -Neurology consulted and appreciated -Hemoglobin A1c 4.9, LDL 83 -Echocardiogram showed an EF of 60 to 65%, no embolism noted -EKG showing atrial  fibrillation -Patient currently on aspirin and statin -Given her history of GI bleed she is felt to be a poor candidate for anticoagulation for her atrial fibrillation -PT, OT recommending SNF  Oropharyngeal dysphagia -Patient did have MBS on 12/06/2018 and required core track for feeding, which is discontinued -Speech therapy consulted and patient currently advanced to dysphagia 1 diet with thin liquids.  Being fed with assistance -Continue aspiration precautions  Hyperlipidemia -Continue statin  Normocytic anemia with history of GI bleeding -Hemoglobin currently stable  -Patient had EGD on 11/17/2018 with ulcer at the JG anastomosis but no visible blood vessel  Persistent atrial fibrillation -Continue Cardizem and metoprolol -As above given her history of GI bleed, patient is a poor candidate for anticoagulation  Anxiety/depression -Continue Celexa, Remeron, Seroquel, Ativan, venlafaxine -Remeron and Seroquel doses have been reduced  Essential hypertension -Continue Cardizem and metoprolol  Goals of care -Palliative care consulted and appreciated, patient currently DNR  Morbid obesity -BMI 42  DVT Prophylaxis  lovenox  Code Status: DNR  Family Communication: None at bedside  Disposition Plan: Admitted. Pending SNF  Consultants Neurology Palliative care  Procedures  Carotid Doppler  Antibiotics   Anti-infectives (From admission, onward)   None      Subjective:   Adrienne Stone seen and examined today.  Patient has no complaints this morning.  Denies shortness of breath, chest pain, abdominal pain.    Objective:   Vitals:   12/24/18 0341 12/24/18 0500 12/24/18 0855 12/24/18 1230  BP: (!) 148/72  (!) 150/87 (!) 142/67  Pulse: 76  91 75  Resp: 18  (!) 24 16  Temp: 98.6 F (37 C)  97.7 F (  36.5 C) 98.3 F (36.8 C)  TempSrc: Axillary  Oral Oral  SpO2: 98%  99% 98%  Weight:  107.9 kg    Height:        Intake/Output Summary (Last 24 hours) at  12/24/2018 1419 Last data filed at 12/24/2018 1230 Gross per 24 hour  Intake 765 ml  Output 400 ml  Net 365 ml   Filed Weights   12/20/18 0500 12/23/18 0500 12/24/18 0500  Weight: 110.9 kg 106 kg 107.9 kg   Exam  General: Well developed, chronically ill-appearing, NAD  HEENT: NCAT, mucous membranes moist.   Neuro: AAOx2 (self, place), left sided weakness  Psych:, Appropriate mood and affect   Data Reviewed: I have personally reviewed following labs and imaging studies  CBC: No results for input(s): WBC, NEUTROABS, HGB, HCT, MCV, PLT in the last 168 hours. Basic Metabolic Panel: No results for input(s): NA, K, CL, CO2, GLUCOSE, BUN, CREATININE, CALCIUM, MG, PHOS in the last 168 hours. GFR: Estimated Creatinine Clearance: 64.6 mL/min (by C-G formula based on SCr of 0.9 mg/dL). Liver Function Tests: No results for input(s): AST, ALT, ALKPHOS, BILITOT, PROT, ALBUMIN in the last 168 hours. No results for input(s): LIPASE, AMYLASE in the last 168 hours. No results for input(s): AMMONIA in the last 168 hours. Coagulation Profile: No results for input(s): INR, PROTIME in the last 168 hours. Cardiac Enzymes: No results for input(s): CKTOTAL, CKMB, CKMBINDEX, TROPONINI in the last 168 hours. BNP (last 3 results) No results for input(s): PROBNP in the last 8760 hours. HbA1C: No results for input(s): HGBA1C in the last 72 hours. CBG: Recent Labs  Lab 12/23/18 1153 12/23/18 1623 12/23/18 2116 12/24/18 0612 12/24/18 1133  GLUCAP 107* 94 100* 87 117*   Lipid Profile: No results for input(s): CHOL, HDL, LDLCALC, TRIG, CHOLHDL, LDLDIRECT in the last 72 hours. Thyroid Function Tests: No results for input(s): TSH, T4TOTAL, FREET4, T3FREE, THYROIDAB in the last 72 hours. Anemia Panel: No results for input(s): VITAMINB12, FOLATE, FERRITIN, TIBC, IRON, RETICCTPCT in the last 72 hours. Urine analysis:    Component Value Date/Time   COLORURINE YELLOW 12/04/2018 0110   APPEARANCEUR  CLEAR 12/04/2018 0110   LABSPEC 1.008 12/04/2018 0110   PHURINE 5.0 12/04/2018 0110   GLUCOSEU NEGATIVE 12/04/2018 0110   HGBUR NEGATIVE 12/04/2018 0110   BILIRUBINUR NEGATIVE 12/04/2018 0110   KETONESUR NEGATIVE 12/04/2018 0110   PROTEINUR NEGATIVE 12/04/2018 0110   NITRITE NEGATIVE 12/04/2018 0110   LEUKOCYTESUR NEGATIVE 12/04/2018 0110   Sepsis Labs: @LABRCNTIP (procalcitonin:4,lacticidven:4)  )No results found for this or any previous visit (from the past 240 hour(s)).    Radiology Studies: No results found.   Scheduled Meds: . aspirin  325 mg Per Tube Daily   Or  . aspirin  300 mg Rectal Daily  . atorvastatin  40 mg Oral q1800  . diltiazem  240 mg Oral Daily  . enoxaparin (LOVENOX) injection  40 mg Subcutaneous Q24H  . escitalopram  10 mg Oral Daily  . famotidine  40 mg Oral BID  . feeding supplement (ENSURE ENLIVE)  237 mL Oral BID BM  . iopamidol  100 mL Intravenous Once  . metoprolol tartrate  50 mg Oral BID  . mirtazapine  15 mg Oral QHS  . QUEtiapine  25 mg Oral QHS  . sodium chloride flush  10-40 mL Intracatheter Q12H  . venlafaxine  50 mg Oral BID   Continuous Infusions:   LOS: 20 days   Time Spent in minutes   30 minutes  Quina Wilbourne D.O. on 12/24/2018 at 2:19 PM  Between 7am to 7pm - Please see pager noted on [email or web address]  After 7pm go to [email or web address]  And look for the night coverage person covering for me after hours  [facility] Office  [phone]

## 2018-12-24 NOTE — Plan of Care (Signed)
progressing 

## 2018-12-24 NOTE — Progress Notes (Signed)
Nutrition Follow-up  DOCUMENTATION CODES:   Morbid obesity  INTERVENTION:  Discontinue Ensure.   Provide Boost Breeze po TID, each supplement provides 250 kcal and 9 grams of protein.  Encourage adequate PO intake.   NUTRITION DIAGNOSIS:   Inadequate oral intake related to acute illness, dysphagia as evidenced by NPO status; diet advanced; improving  GOAL:   Patient will meet greater than or equal to 90% of their needs; progressing  MONITOR:   PO intake, Supplement acceptance, Diet advancement, Labs, Weight trends, I & O's, Skin  REASON FOR ASSESSMENT:   Consult Enteral/tube feeding initiation and management  ASSESSMENT:    73 yo female admitted with acute ischemic CVA with left-sided weakness, dysarthria and dysphagia. PMH includes depression/anxiety, HTN Tube feeding discontinued 3/13. Cortrak NGT removed.  Diet has been advanced to a dysphagia 3 diet with thin liquids. Meal completion has been varied from 20-100%. Intake has mental status has been improving. Pt prefers Boost Breeze over Ensure. RD to modify orders. Pt encouraged to eat her food at meals and to drink her supplements. Labs and medications reviewed.   Diet Order:   Diet Order            DIET DYS 3 Room service appropriate? No; Fluid consistency: Thin  Diet effective now              EDUCATION NEEDS:   Not appropriate for education at this time  Skin:  Skin Assessment: Reviewed RN Assessment  Last BM:  3/17  Height:   Ht Readings from Last 1 Encounters:  12/03/18 5\' 3"  (1.6 m)    Weight:   Wt Readings from Last 1 Encounters:  12/24/18 107.9 kg    Ideal Body Weight:  52.3 kg  BMI:  Body mass index is 42.14 kg/m.  Estimated Nutritional Needs:   Kcal:  1800-2000 kcals   Protein:  90-100 g   Fluid:  >/= 1.8 L    Roslyn Smiling, MS, RD, LDN Pager # [phone] After hours/ weekend pager # [phone]

## 2018-12-25 LAB — GLUCOSE, CAPILLARY
Glucose-Capillary: 94 mg/dL (ref 70–99)
Glucose-Capillary: 118 mg/dL — ABNORMAL HIGH (ref 70–99)

## 2018-12-25 MED ORDER — ATORVASTATIN CALCIUM 40 MG PO TABS: 40.0000 mg | ORAL_TABLET | Freq: Every day | ORAL | Status: AC

## 2018-12-25 MED ORDER — HYDROCODONE-ACETAMINOPHEN 5-325 MG PO TABS: 1.0000 | ORAL_TABLET | Freq: Four times a day (QID) | ORAL | 0 refills | Status: DC | PRN

## 2018-12-25 MED ORDER — DILTIAZEM HCL 60 MG PO TABS: 60.0000 mg | ORAL_TABLET | Freq: Four times a day (QID) | ORAL | Status: DC

## 2018-12-25 MED ORDER — QUETIAPINE FUMARATE 25 MG PO TABS: 25.0000 mg | ORAL_TABLET | Freq: Every day | ORAL | Status: DC

## 2018-12-25 MED ORDER — ASPIRIN 300 MG RE SUPP: 300.0000 mg | Freq: Every day | RECTAL | Status: DC

## 2018-12-25 MED ORDER — MIRTAZAPINE 15 MG PO TABS: 15.0000 mg | ORAL_TABLET | Freq: Every day | ORAL | Status: AC

## 2018-12-25 MED ORDER — METOPROLOL TARTRATE 50 MG PO TABS: 50.0000 mg | ORAL_TABLET | Freq: Two times a day (BID) | ORAL | Status: DC

## 2018-12-25 MED ORDER — LIP MEDEX EX OINT
TOPICAL_OINTMENT | CUTANEOUS | Status: DC | PRN
  Filled 2018-12-25: qty 7

## 2018-12-25 MED ORDER — LORAZEPAM 0.5 MG PO TABS: 0.5000 mg | ORAL_TABLET | Freq: Every evening | ORAL | 0 refills | Status: DC | PRN

## 2018-12-25 MED ORDER — DILTIAZEM HCL 30 MG PO TABS
60.0000 mg | ORAL_TABLET | Freq: Four times a day (QID) | ORAL | Status: DC
  Administered 2018-12-25: 60 mg via ORAL
  Filled 2018-12-25: qty 2

## 2018-12-25 MED ORDER — ASPIRIN 325 MG PO TABS
325.0000 mg | ORAL_TABLET | Freq: Every day | ORAL | Status: DC
  Administered 2018-12-25: 325 mg via ORAL
  Filled 2018-12-25: qty 1

## 2018-12-25 MED ORDER — ASPIRIN 325 MG PO TABS: 325.0000 mg | ORAL_TABLET | Freq: Every day | ORAL | Status: AC

## 2018-12-25 MED ORDER — SENNOSIDES-DOCUSATE SODIUM 8.6-50 MG PO TABS: 1.0000 | ORAL_TABLET | Freq: Every evening | ORAL | Status: AC | PRN

## 2018-12-25 NOTE — TOC Transition Note (Signed)
Transition of Care [facility]) - CM/SW Discharge Note   Patient Details  [name] MRN: [id number] Date of Birth: July 04, 1946  [facility] CM/SW Contact:  Baldemar Lenis, LCSW Phone Number: 12/25/2018, 12:24 PM   Clinical Narrative:  Nurse to call report to [phone], Room 1203P     Final next level of care: Skilled Nursing Facility Barriers to Discharge: No Barriers Identified   Patient Goals and CMS Choice        Discharge Placement              Patient chooses bed at: [facility] Patient to be transferred to facility by: [facility] Name of family member notified: Sister Patient and family notified of of transfer: 12/25/18  Discharge Plan and Services In-house Referral: Clinical Social Work Discharge Planning Services: CM Consult                      Social Determinants of Health (SDOH) Interventions     Readmission Risk Interventions No flowsheet data found.

## 2018-12-25 NOTE — Discharge Summary (Signed)
Physician Discharge Summary  Adrienne Stone ZOX:[id number] DOB: 31-Mar-1946 DOA: 12/03/2018  PCP: [name], PA-C  Admit date: 12/03/2018 Discharge date: 12/25/2018  Time spent: 45 minutes  Recommendations for Outpatient Follow-up:  Patient will be discharged to skilled nursing facility, continue physical, occupational, speech therapy.  Patient will need to follow up with primary care provider within one week of discharge. Follow up with neurology.  Patient should continue medications as prescribed.  Patient should follow a dysphagia 3 diet.   Discharge Diagnoses:  Principal Problem:   Acute ischemic stroke [facility]) Active Problems: Oropharyngeal dysphagia Hyperlipidemia Normocytic anemia with history of GI bleeding Persistent atrial fibrillation Anxiety/depression Essential hypertension Goals of care Morbid obesity   Discharge Condition: Stable  Diet recommendation: Dysphagia 3  Filed Weights   12/23/18 0500 12/24/18 0500 12/25/18 0500  Weight: 106 kg 107.9 kg 107.5 kg    History of present illness:  On 12/03/2018 Adrienne Royse Ballengeris a 73 y.o.femalewith medical history significant fordepression with anxiety, hypertension, atrial fibrillation not anticoagulated due to recurrent GI bleeding, now presenting to the emergency department from her nursing facility for evaluation of left-sided weakness, dysarthria, and fall. Patient is accompanied by her sister who assists with the history. Nursing facility personnel reported that the patient fell at approximately 1:30 PM, but had told the patient's sister that she fell at approximately 5 PM. Around the same time, she was noted to have a new speech difficulty and was not moving her left side very well. EMS was called and she was brought into the ED for evaluation of this. Patient's ability to provide a history is limited by her severe dysarthria. Her sister was with her yesterday, she seemed to be in her usual state at that  time, and was not complaining of anything.  Hospital Course:  Acute ischemic stroke -Presented with left-sided flaccid weakness and dysphasia -CT head 2/27: Attenuation right basal ganglia, early infarct -CT head 2/28: Right basal/IC infarct.  rM1 thromboembolism. -MRI: Right basal ganglia infarcts.  Small right posterior temporal lobe infarct.  FLAIR hyperintensity R M2.  rM1 patent. -MRA focal right M1 segment flow loss, narrowing right V4 segment -Carotid Doppler bilateral ICA with 1-39 percent stenosis, right vertebral artery with high resistant flow -Neurology consulted and appreciated -Hemoglobin A1c 4.9, LDL 83 -Echocardiogram showed an EF of 60 to 65%, no embolism noted -EKG showing atrial fibrillation -Patient currently on aspirin and statin -Given her history of GI bleed she is felt to be a poor candidate for anticoagulation for her atrial fibrillation -PT, OT recommending SNF  Oropharyngeal dysphagia -Patient did have MBS on 12/06/2018 and required core track for feeding, which is discontinued -Speech therapy consulted and patient currently advanced to dysphagia 1 diet with thin liquids.  Being fed with assistance -Continue aspiration precautions  Hyperlipidemia -Continue statin  Normocytic anemia with history of GI bleeding -Hemoglobin currently stable   -Patient had EGD on 11/17/2018 with ulcer at the JG anastomosis but no visible blood vessel -GI recommended omeprazole  BID-capsule should be cracked open incontinence ingested to maximize interruption of PPI (PN from 11/18/2018)  Persistent atrial fibrillation -Continue Cardizem and metoprolol -As above given her history of GI bleed, patient is a poor candidate for anticoagulation  Anxiety/depression -Continue Celexa, Remeron, Seroquel, Ativan, venlafaxine -Remeron and Seroquel doses have been reduced  Essential hypertension -Continue Cardizem and metoprolol  Goals of care -Palliative care consulted and  appreciated, patient currently DNR  Morbid obesity -BMI 42  Code status: DNR  Consultants Neurology  Palliative care  Procedures  Carotid Doppler  Discharge Exam: Vitals:   12/25/18 1059 12/25/18 1117  BP: (!) 128/58 123/70  Pulse:  61  Resp:  17  Temp:  98.3 F (36.8 C)  SpO2:  99%     General: Well developed, chronically ill-appearing, NAD  HEENT: NCAT, mucous membranes moist.  Cardiovascular: S1 S2 auscultated,RRR   Respiratory: Diminished however clear  Abdomen: Soft, obese, nontender, nondistended, + bowel sounds  Extremities: warm dry without cyanosis clubbing or edema  Neuro: AAOx2, left-sided weakness  Discharge Instructions Discharge Instructions    Discharge instructions   Complete by:  As directed    Patient will be discharged to skilled nursing facility, continue physical, occupational, speech therapy.  Patient will need to follow up with primary care provider within one week of discharge. Follow up with neurology.  Patient should continue medications as prescribed.  Patient should follow a dysphagia 3 diet.     Allergies as of 12/25/2018      Reactions   Demerol [meperidine] Rash   Penicillins Rash   Did it involve swelling of the face/tongue/throat, SOB, or low BP? Unknown Did it involve sudden or severe rash/hives, skin peeling, or any reaction on the inside of your mouth or nose? Unknown Did you need to seek medical attention at a hospital or doctor's office? Unknown When did it last happen?15 years ago If all above answers are "NO", may proceed with cephalosporin use.   Prozac [fluoxetine Hcl] Palpitations   Serzone [nefazodone] Rash   Sulfa Antibiotics Rash      Medication List    STOP taking these medications   cloNIDine 0.1 MG tablet Commonly known as:  CATAPRES   diltiazem 360 MG 24 hr capsule Commonly known as:  CARDIZEM CD   fenofibrate 160 MG tablet   lisinopril 5 MG tablet Commonly known as:  PRINIVIL,ZESTRIL    loratadine 10 MG tablet Commonly known as:  CLARITIN     TAKE these medications   acetaminophen 650 MG CR tablet Commonly known as:  TYLENOL Take 1,300 mg by mouth every 8 (eight) hours as needed for pain.   aspirin 325 MG tablet Take 1 tablet (325 mg total) by mouth daily. Start taking on:  December 26, 2018   atorvastatin 40 MG tablet Commonly known as:  LIPITOR Take 1 tablet (40 mg total) by mouth daily at 6 PM.   CALCIUM 1200 PO Take 1,200 mg by mouth daily.   diltiazem 60 MG tablet Commonly known as:  CARDIZEM Take 1 tablet (60 mg total) by mouth every 6 (six) hours.   escitalopram 10 MG tablet Commonly known as:  LEXAPRO Take 10 mg by mouth daily.   ferrous sulfate 325 (65 FE) MG tablet Take 325 mg by mouth daily with breakfast.   guaiFENesin 600 MG 12 hr tablet Commonly known as:  MUCINEX Take 600 mg by mouth 2 (two) times daily.   HYDROcodone-acetaminophen 5-325 MG tablet Commonly known as:  NORCO/VICODIN Take 1 tablet by mouth every 6 (six) hours as needed for moderate pain.   lidocaine 5 % Commonly known as:  Lidoderm Place 1 patch onto the skin daily. Remove & Discard patch within 12 hours or as directed by MD   LORazepam 0.5 MG tablet Commonly known as:  ATIVAN Take 1 tablet (0.5 mg total) by mouth at bedtime as needed for anxiety.   Magnesium 250 MG Tabs Take 250 mg by mouth daily.   metoprolol tartrate 50 MG tablet Commonly known  as:  LOPRESSOR Take 1 tablet (50 mg total) by mouth 2 (two) times daily. What changed:    medication strength  how much to take   mirtazapine 15 MG tablet Commonly known as:  REMERON Take 1 tablet (15 mg total) by mouth at bedtime. What changed:    medication strength  how much to take   multivitamin capsule Take 1 capsule by mouth daily.   omeprazole 40 MG capsule Commonly known as:  PriLOSEC crack open and ingest contents to maximize absorption of PPI given your gastric bypass state What changed:     how much to take  how to take this  when to take this   polyethylene glycol powder powder Commonly known as:  GLYCOLAX/MIRALAX Take 17 g by mouth daily as needed for mild constipation.   QUEtiapine 25 MG tablet Commonly known as:  SEROQUEL Take 1 tablet (25 mg total) by mouth at bedtime. What changed:    medication strength  how much to take   senna-docusate 8.6-50 MG tablet Commonly known as:  Senokot-S Take 1 tablet by mouth at bedtime as needed for mild constipation.   venlafaxine 50 MG tablet Commonly known as:  EFFEXOR Take 50 mg by mouth 2 (two) times daily.   vitamin C 500 MG tablet Commonly known as:  ASCORBIC ACID Take 1,000 mg by mouth daily.      Allergies  Allergen Reactions  . Demerol [Meperidine] Rash  . Penicillins Rash    Did it involve swelling of the face/tongue/throat, SOB, or low BP? Unknown Did it involve sudden or severe rash/hives, skin peeling, or any reaction on the inside of your mouth or nose? Unknown Did you need to seek medical attention at a hospital or doctor's office? Unknown When did it last happen?15 years ago If all above answers are "NO", may proceed with cephalosporin use.   . Prozac [Fluoxetine Hcl] Palpitations  . Serzone [Nefazodone] Rash  . Sulfa Antibiotics Rash    Contact information for follow-up providers    [name], PA-C. Schedule an appointment as soon as possible for a visit in 1 week(s).   Specialty:  Physician Assistant Why:  Hospital follow up Contact information: [address] [place] [address] [phone]        [facility]. Schedule an appointment as soon as possible for a visit in 4 week(s).   Specialty:  Neurology Why:  Hospital follow up, stroke clinic Contact information: [address] [place] [address] [phone]           Contact information for after-discharge care    Destination    [facility]erred [facility] .    Service:  Skilled Nursing Contact information: [address] [place] [address] [phone]                   The results of significant diagnostics from this hospitalization (including imaging, microbiology, ancillary and laboratory) are listed below for reference.    Significant Diagnostic Studies: Dg Thoracic Spine 2 View  Result Date: 12/04/2018 CLINICAL DATA:  Fall yesterday with upper back pain, initial encounter EXAM: THORACIC SPINE 2 VIEWS COMPARISON:  None. FINDINGS: Vertebral body height is well maintained. Mild osteophytic changes are seen. Pedicles are within normal limits and no paraspinal mass is seen. The visualize ribcage shows no abnormality. IMPRESSION: Degenerative change without acute abnormality. Electronically Signed   By: Alcide Clever M.D.   On: 12/04/2018 10:15   Dg Lumbar  Spine 2-3 Views  Result Date: 12/04/2018 CLINICAL DATA:  Recent fall with back pain, initial encounter EXAM: LUMBAR SPINE - 2-3 VIEW COMPARISON:  02/15/18 FINDINGS: Five lumbar type vertebral bodies are well visualized. Vertebral body height is well maintained with the exception of a chronic compression deformity of L1. No new focal abnormality is seen. Mild osteophytic changes are noted. IMPRESSION: Chronic L1 compression deformity.  No acute abnormality seen. Electronically Signed   By: Alcide Clever M.D.   On: 12/04/2018 10:16   Dg Abd 1 View  Result Date: 12/14/2018 CLINICAL DATA:  Acute right upper quadrant abdominal pain. EXAM: ABDOMEN - 1 VIEW COMPARISON:  None. FINDINGS: Large amount of residual contrast is seen throughout the colon and small bowel and rectum. No abnormal bowel dilatation is noted. Surgical clips are seen in the left side of the pelvis. Distal tip of feeding tube is seen in stomach. IMPRESSION: No evidence of bowel obstruction or ileus. Electronically Signed   By: Lupita Raider, M.D.   On: 12/14/2018 19:18   Dg Abd 1 View  Result Date:  12/08/2018 CLINICAL DATA:  Stroke. Feeding tube placement. History of gastric bypass surgery. EXAM: ABDOMEN - 1 VIEW; DG NASO G TUBE PLC W/FL-NO RAD CONTRAST:  41mL ISOVUE-300 IOPAMIDOL (ISOVUE-300) INJECTION 61% FLUOROSCOPY TIME:  Fluoroscopy Time:  1 minutes, 36 seconds. Radiation Exposure Index (if provided by the fluoroscopic device): 14.5 mGy Number of Acquired Spot Images: 0 COMPARISON:  None. FINDINGS: Under fluoroscopy, a feeding tube was advanced through the gastrojejunal anastomosis into the mid jejunum. Injection of contrast confirmed jejunal placement. Oral contrast is seen within the colon. IMPRESSION: 1. Feeding tube placement in the mid jejunum. Electronically Signed   By: Obie Dredge M.D.   On: 12/08/2018 11:47   Ct Head Wo Contrast  Result Date: 12/04/2018 CLINICAL DATA:  Follow up stroke. History of hypertension trauma hypercholesterolemia. EXAM: CT HEAD WITHOUT CONTRAST TECHNIQUE: Contiguous axial images were obtained from the base of the skull through the vertex without intravenous contrast. COMPARISON:  CT HEAD December 03, 2018 FINDINGS: BRAIN: RIGHT basal ganglia and internal capsule loss of gray-white matter differentiation. No intraparenchymal hemorrhage, mass effect, midline shift. No abnormal extra-axial fluid collections. VASCULAR: Mildly hyperdense RIGHT M1 segment. Mild calcific atherosclerosis carotid siphons. SKULL: No skull fracture. No significant scalp soft tissue swelling. SINUSES/ORBITS: Trace paranasal sinus mucosal thickening. Mastoid air cells are well aerated.The included ocular globes and orbital contents are non-suspicious. OTHER: None. IMPRESSION: 1. Acute nonhemorrhagic RIGHT basal ganglia/internal capsule MCA territory infarct. 2. Suspected RIGHT M1 thromboembolism. Electronically Signed   By: Awilda Metro M.D.   On: 12/04/2018 05:43   Ct Head Wo Contrast  Result Date: 12/03/2018 CLINICAL DATA:  73 y/o F; unwitnessed fall, altered level of consciousness.  EXAM: CT HEAD WITHOUT CONTRAST TECHNIQUE: Contiguous axial images were obtained from the base of the skull through the vertex without intravenous contrast. COMPARISON:  08/19/2018 CT head. 09/24/2018 MRI head. FINDINGS: Brain: Hypoattenuation within the right basal ganglia involving the caudate nucleus, lentiform nucleus, and posterior limb of internal capsule compatible with acute/early subacute infarction. ASPECTS is 7. no additional findings of stroke, hemorrhage, extra-axial collection, hydrocephalus, or herniation. Vascular: Dense right M1, suspected thrombus (series 3, image 17). Skull: Normal. Negative for fracture or focal lesion. Sinuses/Orbits: No acute finding. Other: None. IMPRESSION: 1. Hypoattenuation within right basal ganglia involving caudate nucleus, lentiform nucleus, and posterior limb of internal capsule compatible with acute/early subacute infarction. ASPECTS is 7. No hemorrhage. 2. Dense right  M1, suspected thrombus. These results were called by telephone at the time of interpretation on 12/03/2018 at 8:32 pm to Dr. Donnetta Hutching , who verbally acknowledged these results. Electronically Signed   By: Mitzi Hansen M.D.   On: 12/03/2018 20:34   Mr Maxine Glenn Head Wo Contrast  Result Date: 12/05/2018 CLINICAL DATA:  Follow-up examination for acute right MCA territory infarct. EXAM: MRA HEAD WITHOUT CONTRAST TECHNIQUE: Angiographic images of the Circle of Willis were obtained using MRA technique without intravenous contrast. COMPARISON:  Prior MRI from 12/04/2018 FINDINGS: ANTERIOR CIRCULATION: Distal cervical segments of the internal carotid arteries are patent with symmetric antegrade flow. Petrous, cavernous, and supraclinoid segments patent without hemodynamically significant stenosis. Origin of the ophthalmic arteries patent bilaterally. ICA termini well perfused. A1 segments patent bilaterally. Normal anterior communicating artery. Anterior cerebral arteries patent to their distal  aspects without high-grade stenosis. Left M1 widely patent without stenosis. Normal left MCA bifurcation. Left MCA branches well perfused to their distal aspects. Right M1 patent proximally. There is focal signal loss at the distal right M1/right MCA bifurcation (series 5, image 112), likely occluded. Signal loss extends into proximal M2 branches. Some flow related signal is seen distally within anterior right MCA branches, with little to no flow related signal seen within the mid and posterior right MCA region. POSTERIOR CIRCULATION: Left vertebral artery dominant and patent to the vertebrobasilar junction without stenosis. Right vertebral artery diffusely hypoplastic. Moderate diffuse narrowing of the proximal right V4 segment noted. Posterior inferior cerebral arteries patent bilaterally. Basilar patent to its distal aspect without stenosis. Superior cerebral arteries patent bilaterally. Left PCA supplied primarily via the basilar as well as a small left posterior communicating artery. Predominant fetal type origin of the right PCA supplied via a robust right posterior communicating artery. PCAs widely patent to their distal aspects without stenosis. No intracranial aneurysm. IMPRESSION: 1. Focal loss of flow related signal within the distal right M1 segment/right MCA bifurcation, likely at least partially occluded. Some flow preserved distally within anterior right MCA branches, with little to no flow related signal seen within the mid and posterior right MCA distribution. 2. Moderate diffuse narrowing of the hypoplastic right V4 segment. Dominant left V4 widely patent to the vertebrobasilar junction. 3. Otherwise wide patency of the intracranial circulation, with no other hemodynamically significant or correctable stenosis identified. Electronically Signed   By: Rise Mu M.D.   On: 12/05/2018 02:40   Mr Brain Wo Contrast  Result Date: 12/04/2018 CLINICAL DATA:  Stroke.  Atrial fibrillation.  EXAM: MRI HEAD WITHOUT CONTRAST TECHNIQUE: Multiplanar, multiecho pulse sequences of the brain and surrounding structures were obtained without intravenous contrast. COMPARISON:  CT head 12/04/2018 FINDINGS: Brain: Acute infarct right basal ganglia involving the body and tail of caudate and most of the putamen. Relative sparing of the internal capsule and head of caudate. Additional small areas of acute infarct in the right posterior temporal lobe. Negative for hemorrhage. Ventricle size normal. Small chronic infarct left lateral basal ganglia. No other significant chronic ischemia. Negative for mass or edema. No midline shift. Vascular: Normal arterial flow voids in the internal carotid arteries and M1 segments. There is FLAIR hyperintensity in right M2 branch which is likely due to clot corresponding to hyperdensity on CT. Normal flow void in the vertebral arteries and basilar. Skull and upper cervical spine: Negative Sinuses/Orbits: Negative Other: None IMPRESSION: Acute infarct in the right basal ganglia. Smaller area of acute infarct right posterior temporal lobe FLAIR hyperintensity right M2 segment. Right M1  segment appears patent. These results were called by telephone at the time of interpretation on 12/04/2018 at 11:16 am to Dr. Roda Shutters , who verbally acknowledged these results. Electronically Signed   By: Marlan Palau M.D.   On: 12/04/2018 11:17   Dg Chest Port 1 View  Result Date: 12/08/2018 CLINICAL DATA:  Shortness of breath last evening. EXAM: PORTABLE CHEST 1 VIEW COMPARISON:  12/03/2018 FINDINGS: Enteric tube is identified with tip well below the level of the GE junction. Surgical clips are noted within the left upper quadrant of the abdomen. Normal heart size. No pleural effusion or edema. Aortic atherosclerosis. No airspace opacities. Coarsened interstitial markings throughout both lungs are again noted and appear unchanged. IMPRESSION: No active disease. Electronically Signed   By: Signa Kell  M.D.   On: 12/08/2018 12:24   Dg Chest Port 1 View  Result Date: 12/03/2018 CLINICAL DATA:  Cough EXAM: PORTABLE CHEST 1 VIEW COMPARISON:  11/14/2018 FINDINGS: Mild cardiomegaly. No confluent opacity, effusion or edema. No acute bony abnormality. IMPRESSION: Cardiomegaly.  No acute findings. Electronically Signed   By: Charlett Nose M.D.   On: 12/03/2018 19:21   Dg Vangie Bicker G Tube Plc W/fl-no Rad  Result Date: 12/08/2018 CLINICAL DATA:  Stroke. Feeding tube placement. History of gastric bypass surgery. EXAM: ABDOMEN - 1 VIEW; DG NASO G TUBE PLC W/FL-NO RAD CONTRAST:  20mL ISOVUE-300 IOPAMIDOL (ISOVUE-300) INJECTION 61% FLUOROSCOPY TIME:  Fluoroscopy Time:  1 minutes, 36 seconds. Radiation Exposure Index (if provided by the fluoroscopic device): 14.5 mGy Number of Acquired Spot Images: 0 COMPARISON:  None. FINDINGS: Under fluoroscopy, a feeding tube was advanced through the gastrojejunal anastomosis into the mid jejunum. Injection of contrast confirmed jejunal placement. Oral contrast is seen within the colon. IMPRESSION: 1. Feeding tube placement in the mid jejunum. Electronically Signed   By: Obie Dredge M.D.   On: 12/08/2018 11:47   Dg Swallowing Func-speech Pathology  Result Date: 12/14/2018 Objective Swallowing Evaluation: Type of Study: MBS-Modified Barium Swallow Study  Patient Details [name] MRN: [id number] Date of Birth: 1946-01-06 Today's Date: 12/14/2018 Time: SLP Start Time (ACUTE ONLY): 1705 -SLP Stop Time (ACUTE ONLY): 1720 SLP Time Calculation (min) (ACUTE ONLY): 15 min Past Medical History: Past Medical History: Diagnosis Date . Depression  . Hypercholesteremia  . Hypertension  Past Surgical History: Past Surgical History: Procedure Laterality Date . ABDOMINAL HYSTERECTOMY   . BIOPSY  11/17/2018  Procedure: BIOPSY;  Surgeon: Benancio Deeds, MD;  Location: [facility] ENDOSCOPY;  Service: Gastroenterology;; . CHOLECYSTECTOMY   . ESOPHAGOGASTRODUODENOSCOPY (EGD) WITH PROPOFOL N/A  11/17/2018  Procedure: ESOPHAGOGASTRODUODENOSCOPY (EGD) WITH PROPOFOL;  Surgeon: Benancio Deeds, MD;  Location: [facility] ENDOSCOPY;  Service: Gastroenterology;  Laterality: N/A; . GASTRIC BYPASS   . thumb surgery   HPI: 73 y.o. female with new left sided weakness, unknown time of onset. Neurology Exam reveals left facial droop, left hemiplegia, rightward eye deviation, dysarthria and left hemineglect, consistent with a large right MCA infarction. CT head reveals hypoattenuation within the right basal ganglia involving the caudate nucleus, lentiform nucleus, and posterior limb of internal capsule, compatible with acute/early subacute infarction. MRI shows R BG, small right posterior temporal infarcts.  Subjective: alert, cooperative Assessment / Plan / Recommendation CHL IP CLINICAL IMPRESSIONS 12/14/2018 Clinical Impression Instrumental swallow study requested to objectively assess integrity of swallow mechanism in light of possible s/s of aspiration at bedside. Area of darkness, possible calcification, on aryynoids prior to introducing barium. This area remains stable throughout study and doesn't  indicate penetration or aspiration of bolus.  There is also an area of darkness on the posterior portion of trachea that is not related to barium. Additionally, pt does intermittently cough without any evidence of aspiration/penetration or residue, thus she presents as possibly aspirating when consuming at the bedside. Pt continues to present with moderate to severe oral phase dysphagia c/b anterior spillage, decreased bolus manipulation, lingual pumping and premature spillage. Pt with delayed but functional swallow initiation at the pyriform sinuses with thin liquids via cup sips. Pt is able to obtain good larygneal closure with no penetration or aspiration observed. With larger straw sips of thin liquids, pt with silent aspiration of bolus. Recommend continuing dysphagia 1 diet with thin liquids via controlled cup sips,  medicine crushed in puree with full supervision/assist with PO intake. Given pt's severe cognitive deficits, pt continues to require strict aspiration precautions such as being alert, sitting upright with oral care prior to PO intake. Education provided to pt's nurse.  SLP Visit Diagnosis Dysphagia, oropharyngeal phase (R13.12) Attention and concentration deficit following -- Frontal lobe and executive function deficit following -- Impact on safety and function Mild aspiration risk   CHL IP TREATMENT RECOMMENDATION 12/14/2018 Treatment Recommendations Therapy as outlined in treatment plan below   Prognosis 12/06/2018 Prognosis for Safe Diet Advancement Good Barriers to Reach Goals Cognitive deficits Barriers/Prognosis Comment -- CHL IP DIET RECOMMENDATION 12/14/2018 SLP Diet Recommendations Dysphagia 1 (Puree) solids;Thin liquid Liquid Administration via Cup;No straw Medication Administration Crushed with puree Compensations Minimize environmental distractions;Slow rate;Small sips/bites Postural Changes Seated upright at 90 degrees   CHL IP OTHER RECOMMENDATIONS 12/14/2018 Recommended Consults -- Oral Care Recommendations Oral care before and after PO Other Recommendations --   [facility] IP FOLLOW UP RECOMMENDATIONS 12/14/2018 Follow up Recommendations Skilled Nursing facility   [facility] IP FREQUENCY AND DURATION 12/14/2018 Speech Therapy Frequency (ACUTE ONLY) min 2x/week Treatment Duration 2 weeks      CHL IP ORAL PHASE 12/14/2018 Oral Phase Impaired Oral - Pudding Teaspoon -- Oral - Pudding Cup -- Oral - Honey Teaspoon Weak lingual manipulation;Impaired mastication;Left anterior bolus loss;Lingual pumping;Decreased bolus cohesion;Delayed oral transit;Premature spillage Oral - Honey Cup Left anterior bolus loss;Weak lingual manipulation;Lingual pumping;Reduced posterior propulsion;Decreased bolus cohesion;Premature spillage;Delayed oral transit Oral - Nectar Teaspoon Weak lingual manipulation;Lingual pumping;Reduced posterior  propulsion;Decreased bolus cohesion;Delayed oral transit;Premature spillage Oral - Nectar Cup Weak lingual manipulation;Lingual pumping;Reduced posterior propulsion;Delayed oral transit;Decreased bolus cohesion;Premature spillage Oral - Nectar Straw Weak lingual manipulation;Lingual pumping;Reduced posterior propulsion;Delayed oral transit;Decreased bolus cohesion;Premature spillage Oral - Thin Teaspoon Lingual pumping;Weak lingual manipulation;Reduced posterior propulsion;Delayed oral transit;Decreased bolus cohesion;Premature spillage Oral - Thin Cup Left anterior bolus loss;Weak lingual manipulation;Lingual pumping;Reduced posterior propulsion;Delayed oral transit;Decreased bolus cohesion;Premature spillage Oral - Thin Straw Weak lingual manipulation;Lingual pumping;Reduced posterior propulsion;Delayed oral transit;Decreased bolus cohesion;Premature spillage Oral - Puree Impaired mastication;Weak lingual manipulation;Lingual pumping;Reduced posterior propulsion;Delayed oral transit;Decreased bolus cohesion;Premature spillage Oral - Mech Soft NT Oral - Regular -- Oral - Multi-Consistency -- Oral - Pill -- Oral Phase - Comment --  CHL IP PHARYNGEAL PHASE 12/14/2018 Pharyngeal Phase Impaired Pharyngeal- Pudding Teaspoon -- Pharyngeal -- Pharyngeal- Pudding Cup -- Pharyngeal -- Pharyngeal- Honey Teaspoon Delayed swallow initiation-vallecula Pharyngeal Material does not enter airway Pharyngeal- Honey Cup Delayed swallow initiation-vallecula Pharyngeal Material does not enter airway Pharyngeal- Nectar Teaspoon Delayed swallow initiation-pyriform sinuses Pharyngeal -- Pharyngeal- Nectar Cup Delayed swallow initiation-pyriform sinuses Pharyngeal -- Pharyngeal- Nectar Straw Delayed swallow initiation-pyriform sinuses Pharyngeal -- Pharyngeal- Thin Teaspoon Delayed swallow initiation-pyriform sinuses Pharyngeal -- Pharyngeal- Thin Cup Delayed swallow initiation-pyriform  sinuses Pharyngeal -- Pharyngeal- Thin Straw Delayed  swallow initiation-pyriform sinuses;Penetration/Aspiration during swallow Pharyngeal Material enters airway, remains ABOVE vocal cords then ejected out;Material enters airway, passes BELOW cords without attempt by patient to eject out (silent aspiration) Pharyngeal- Puree Delayed swallow initiation-vallecula Pharyngeal -- Pharyngeal- Mechanical Soft NT Pharyngeal -- Pharyngeal- Regular -- Pharyngeal -- Pharyngeal- Multi-consistency -- Pharyngeal -- Pharyngeal- Pill -- Pharyngeal -- Pharyngeal Comment --  CHL IP CERVICAL ESOPHAGEAL PHASE 12/14/2018 Cervical Esophageal Phase WFL Pudding Teaspoon -- Pudding Cup -- Honey Teaspoon -- Honey Cup -- Nectar Teaspoon -- Nectar Cup -- Nectar Straw -- Thin Teaspoon -- Thin Cup -- Thin Straw -- Puree -- Mechanical Soft -- Regular -- Multi-consistency -- Pill -- Cervical Esophageal Comment -- Adrienne Stone 12/14/2018, 11:44 AM              Dg Swallowing Func-speech Pathology  Result Date: 12/06/2018 Objective Swallowing Evaluation: Type of Study: MBS-Modified Barium Swallow Study  Patient Details [name] MRN: [id number] Date of Birth: [birth year] Today's Date: 12/06/2018 Time: SLP Start Time (ACUTE ONLY): 1101 -SLP Stop Time (ACUTE ONLY): 1125 SLP Time Calculation (min) (ACUTE ONLY): 24 min Past Medical History: Past Medical History: Diagnosis Date . Depression  . Hypercholesteremia  . Hypertension  Past Surgical History: Past Surgical History: Procedure Laterality Date . ABDOMINAL HYSTERECTOMY   . BIOPSY  11/17/2018  Procedure: BIOPSY;  Surgeon: Benancio Deeds, MD;  Location: [facility] ENDOSCOPY;  Service: Gastroenterology;; . CHOLECYSTECTOMY   . ESOPHAGOGASTRODUODENOSCOPY (EGD) WITH PROPOFOL N/A 11/17/2018  Procedure: ESOPHAGOGASTRODUODENOSCOPY (EGD) WITH PROPOFOL;  Surgeon: Benancio Deeds, MD;  Location: [facility] ENDOSCOPY;  Service: Gastroenterology;  Laterality: N/A; . GASTRIC BYPASS   . thumb surgery   HPI: 73 y.o. female with new left sided weakness, unknown time  of onset. Neurology Exam reveals left facial droop, left hemiplegia, rightward eye deviation, dysarthria and left hemineglect, consistent with a large right MCA infarction. CT head reveals hypoattenuation within the right basal ganglia involving the caudate nucleus, lentiform nucleus, and posterior limb of internal capsule, compatible with acute/early subacute infarction. MRI shows R BG, small right posterior temporal infarcts.  Subjective: alert, cooperative Assessment / Plan / Recommendation CHL IP CLINICAL IMPRESSIONS 12/06/2018 Clinical Impression Patient presents with moderate-severe oral dysphagia. Oral stage characterized by lingual pumping, poor bolus formation, decreased bolus cohesion, and premature spillage. Penetration/trace aspiration of honey-thick liquids occurs with delayed sensation due to spillage from pyriforms entering laryngeal vestibule prior to closure. Pt sensed these episodes, but with delay. Chin tuck was not effective. No penetration or aspiration with puree. In general, smaller 1/2 teaspoon boluses of honey were better contained in the valleculae than larger spoonfuls or cup sips, though in instances where spillage to pyriforms occurred, there was trace penetration/aspiration. With soft solid, there was absent rotary mastication, lingual thrusting and premature spillage, moderate lingual residue. No abnormal pharyngeal residue. Recommend careful initiation of dysphagia 1 (puree) with pudding-thick liquids by teaspoon, meds crushed in puree, full supervision.  SLP Visit Diagnosis Dysphagia, oral phase (R13.11);Dysphagia, oropharyngeal phase (R13.12) Attention and concentration deficit following -- Frontal lobe and executive function deficit following -- Impact on safety and function Moderate aspiration risk   CHL IP TREATMENT RECOMMENDATION 12/06/2018 Treatment Recommendations Therapy as outlined in treatment plan below;F/U MBS in --- days (Comment)   Prognosis 12/06/2018 Prognosis for Safe Diet  Advancement Good Barriers to Reach Goals Cognitive deficits Barriers/Prognosis Comment -- CHL IP DIET RECOMMENDATION 12/06/2018 SLP Diet Recommendations Dysphagia 1 (Puree) solids;Pudding thick liquid Liquid Administration via [employer]  Medication Administration Crushed with puree Compensations Slow rate;Small sips/bites Postural Changes Seated upright at 90 degrees   CHL IP OTHER RECOMMENDATIONS 12/06/2018 Recommended Consults -- Oral Care Recommendations Oral care before and after PO Other Recommendations Order thickener from pharmacy;Prohibited food (jello, ice cream, thin soups);Remove water pitcher;Have oral suction available   CHL IP FOLLOW UP RECOMMENDATIONS 12/06/2018 Follow up Recommendations Inpatient Rehab   CHL IP FREQUENCY AND DURATION 12/06/2018 Speech Therapy Frequency (ACUTE ONLY) min 2x/week Treatment Duration 2 weeks      CHL IP ORAL PHASE 12/06/2018 Oral Phase Impaired Oral - Pudding Teaspoon -- Oral - Pudding Cup -- Oral - Honey Teaspoon Weak lingual manipulation;Lingual pumping;Reduced posterior propulsion;Delayed oral transit;Decreased bolus cohesion;Premature spillage Oral - Honey Cup Weak lingual manipulation;Lingual pumping;Reduced posterior propulsion;Delayed oral transit;Decreased bolus cohesion;Premature spillage Oral - Nectar Teaspoon -- Oral - Nectar Cup -- Oral - Nectar Straw -- Oral - Thin Teaspoon -- Oral - Thin Cup -- Oral - Thin Straw -- Oral - Puree Weak lingual manipulation;Lingual pumping;Reduced posterior propulsion;Delayed oral transit;Decreased bolus cohesion;Premature spillage Oral - Mech Soft Impaired mastication;Weak lingual manipulation;Lingual pumping;Reduced posterior propulsion;Lingual/palatal residue;Delayed oral transit;Decreased bolus cohesion;Premature spillage Oral - Regular -- Oral - Multi-Consistency -- Oral - Pill -- Oral Phase - Comment --  CHL IP PHARYNGEAL PHASE 12/06/2018 Pharyngeal Phase Impaired Pharyngeal- Pudding Teaspoon -- Pharyngeal -- Pharyngeal- Pudding Cup --  Pharyngeal -- Pharyngeal- Honey Teaspoon Delayed swallow initiation-vallecula;Delayed swallow initiation-pyriform sinuses;Penetration/Aspiration during swallow;Trace aspiration Pharyngeal Material enters airway, remains ABOVE vocal cords and not ejected out;Material enters airway, passes BELOW cords and not ejected out despite cough attempt by patient;Material does not enter airway Pharyngeal- Honey Cup Delayed swallow initiation-pyriform sinuses;Penetration/Aspiration during swallow;Trace aspiration Pharyngeal Material enters airway, remains ABOVE vocal cords and not ejected out;Material enters airway, passes BELOW cords and not ejected out despite cough attempt by patient Pharyngeal- Nectar Teaspoon -- Pharyngeal -- Pharyngeal- Nectar Cup -- Pharyngeal -- Pharyngeal- Nectar Straw -- Pharyngeal -- Pharyngeal- Thin Teaspoon -- Pharyngeal -- Pharyngeal- Thin Cup -- Pharyngeal -- Pharyngeal- Thin Straw -- Pharyngeal -- Pharyngeal- Puree Delayed swallow initiation-vallecula Pharyngeal -- Pharyngeal- Mechanical Soft Delayed swallow initiation-vallecula Pharyngeal -- Pharyngeal- Regular -- Pharyngeal -- Pharyngeal- Multi-consistency -- Pharyngeal -- Pharyngeal- Pill -- Pharyngeal -- Pharyngeal Comment --  CHL IP CERVICAL ESOPHAGEAL PHASE 12/06/2018 Cervical Esophageal Phase WFL Pudding Teaspoon -- Pudding Cup -- Honey Teaspoon -- Honey Cup -- Nectar Teaspoon -- Nectar Cup -- Nectar Straw -- Thin Teaspoon -- Thin Cup -- Thin Straw -- Puree -- Mechanical Soft -- Regular -- Multi-consistency -- Pill -- Cervical Esophageal Comment -- Rondel Baton, MS, CCC-SLP Speech-Language Pathologist Acute Rehabilitation Services Pager: [phone] Office: [phone] Arlana Lindau 12/06/2018, 12:42 PM              Vas US Carotid  Result Date: 12/04/2018 Carotid Arterial Duplex Study Indications:       Speech disturbance, Left side Weakness and Stroke. Risk Factors:      Hypertension, hyperlipidemia. Limitations:       A-fib; Body  habitus; immobility of the head. Comparison Study:  No prior study available. Performing Technologist: Melodie Bouillon  Examination Guidelines: A complete evaluation includes B-mode imaging, spectral Doppler, color Doppler, and power Doppler as needed of all accessible portions of each vessel. Bilateral testing is considered an integral part of a complete examination. Limited examinations for reoccurring indications may be performed as noted.  Right Carotid Findings: +----------+--------+--------+--------+-----------------------+--------+           PSV cm/sEDV cm/sStenosisDescribe  Comments +----------+--------+--------+--------+-----------------------+--------+ CCA Prox  107                     diffuse and hyperechoic         +----------+--------+--------+--------+-----------------------+--------+ CCA Distal100                     diffuse and hyperechoic         +----------+--------+--------+--------+-----------------------+--------+ ICA Prox  78      19      1-39%   diffuse and hyperechoic         +----------+--------+--------+--------+-----------------------+--------+ ICA Distal120     29                                              +----------+--------+--------+--------+-----------------------+--------+ ECA       139                                                     +----------+--------+--------+--------+-----------------------+--------+ +----------+--------+-------+--------+-------------------+           PSV cm/sEDV cmsDescribeArm Pressure (mmHG) +----------+--------+-------+--------+-------------------+ [id number]                                         +----------+--------+-------+--------+-------------------+ +---------+--------+--+--------+----------------------------+ VertebralPSV cm/s63EDV cm/sHigh resistant and Antegrade +---------+--------+--+--------+----------------------------+  Left Carotid Findings:  +----------+--------+--------+--------+-------------------------------+--------+           PSV cm/sEDV cm/sStenosisDescribe                       Comments +----------+--------+--------+--------+-------------------------------+--------+ CCA Prox  125     24              diffuse and hyperechoic                 +----------+--------+--------+--------+-------------------------------+--------+ CCA Distal107     21              diffuse and hyperechoic                 +----------+--------+--------+--------+-------------------------------+--------+ ICA Prox  93      36      1-39%   focal, hyperechoic, calcific                                              and irregular                           +----------+--------+--------+--------+-------------------------------+--------+ ICA Mid   93      33                                                      +----------+--------+--------+--------+-------------------------------+--------+ ICA Distal115     44                                                      +----------+--------+--------+--------+-------------------------------+--------+ +----------+--------+--------+--------+-------------------+  SubclavianPSV cm/sEDV cm/sDescribeArm Pressure (mmHG) +----------+--------+--------+--------+-------------------+           237                                         +----------+--------+--------+--------+-------------------+ +---------+--------+--+--------+--+---------+ VertebralPSV cm/s80EDV cm/s19Antegrade +---------+--------+--+--------+--+---------+  Summary: Right Carotid: Velocities in the right ICA are consistent with a 1-39% stenosis. Left Carotid: Velocities in the left ICA are consistent with a 1-39% stenosis. Vertebrals: Bilateral vertebral arteries demonstrate antegrade flow. Right             vertebral artery demonstrates high resistant flow. *See table(s) above for measurements and observations.      Preliminary     Microbiology: No results found for this or any previous visit (from the past 240 hour(s)).   Labs: Basic Metabolic Panel: No results for input(s): NA, K, CL, CO2, GLUCOSE, BUN, CREATININE, CALCIUM, MG, PHOS in the last 168 hours. Liver Function Tests: No results for input(s): AST, ALT, ALKPHOS, BILITOT, PROT, ALBUMIN in the last 168 hours. No results for input(s): LIPASE, AMYLASE in the last 168 hours. No results for input(s): AMMONIA in the last 168 hours. CBC: No results for input(s): WBC, NEUTROABS, HGB, HCT, MCV, PLT in the last 168 hours. Cardiac Enzymes: No results for input(s): CKTOTAL, CKMB, CKMBINDEX, TROPONINI in the last 168 hours. BNP: BNP (last 3 results) No results for input(s): BNP in the last 8760 hours.  ProBNP (last 3 results) No results for input(s): PROBNP in the last 8760 hours.  CBG: Recent Labs  Lab 12/24/18 1133 12/24/18 1713 12/24/18 2117 12/25/18 0626 12/25/18 1056  GLUCAP 117* 116* 91 94 118*       Signed:  Everson Mott  [facility] 12/25/2018, 12:06 PM

## 2018-12-25 NOTE — Progress Notes (Signed)
Pt D/C from unit with PTAR via stretcher, no lines, D/C packet and DNR given to EMT, pt left with all belongings. Adrienne Stone

## 2018-12-25 NOTE — Progress Notes (Signed)
[facility] Nurse Reuel Boom, gave report for pt. Adrienne Stone

## 2019-05-16 ENCOUNTER — Emergency Department (HOSPITAL_COMMUNITY): Payer: Medicare HMO

## 2019-05-16 ENCOUNTER — Emergency Department (HOSPITAL_COMMUNITY)
Admission: EM | Admit: 2019-05-16 | Discharge: 2019-05-16 | Disposition: A | Discharge status: Home / Self Care | Payer: Medicare HMO | Attending: Emergency Medicine | Admitting: Emergency Medicine

## 2019-05-16 ENCOUNTER — Other Ambulatory Visit: Payer: Self-pay

## 2019-05-16 DIAGNOSIS — Z8673 Personal history of transient ischemic attack (TIA), and cerebral infarction without residual deficits: Secondary | ICD-10-CM | POA: Insufficient documentation

## 2019-05-16 DIAGNOSIS — S0990XA Unspecified injury of head, initial encounter: Secondary | ICD-10-CM | POA: Insufficient documentation

## 2019-05-16 DIAGNOSIS — Y998 Other external cause status: Secondary | ICD-10-CM | POA: Insufficient documentation

## 2019-05-16 DIAGNOSIS — S161XXA Strain of muscle, fascia and tendon at neck level, initial encounter: Secondary | ICD-10-CM | POA: Insufficient documentation

## 2019-05-16 DIAGNOSIS — I10 Essential (primary) hypertension: Secondary | ICD-10-CM | POA: Diagnosis not present

## 2019-05-16 DIAGNOSIS — W06XXXA Fall from bed, initial encounter: Secondary | ICD-10-CM | POA: Diagnosis not present

## 2019-05-16 DIAGNOSIS — Y92122 Bedroom in nursing home as the place of occurrence of the external cause: Secondary | ICD-10-CM | POA: Diagnosis not present

## 2019-05-16 DIAGNOSIS — Z87891 Personal history of nicotine dependence: Secondary | ICD-10-CM | POA: Insufficient documentation

## 2019-05-16 DIAGNOSIS — Z9049 Acquired absence of other specified parts of digestive tract: Secondary | ICD-10-CM | POA: Insufficient documentation

## 2019-05-16 DIAGNOSIS — Z9884 Bariatric surgery status: Secondary | ICD-10-CM | POA: Diagnosis not present

## 2019-05-16 DIAGNOSIS — Y9389 Activity, other specified: Secondary | ICD-10-CM | POA: Insufficient documentation

## 2019-05-16 DIAGNOSIS — S29012A Strain of muscle and tendon of back wall of thorax, initial encounter: Secondary | ICD-10-CM | POA: Insufficient documentation

## 2019-05-16 DIAGNOSIS — S29019A Strain of muscle and tendon of unspecified wall of thorax, initial encounter: Secondary | ICD-10-CM

## 2019-05-16 DIAGNOSIS — W19XXXA Unspecified fall, initial encounter: Secondary | ICD-10-CM

## 2019-05-16 MED ORDER — DILTIAZEM HCL 60 MG PO TABS
60.0000 mg | ORAL_TABLET | Freq: Once | ORAL | Status: AC
  Administered 2019-05-16: 60 mg via ORAL
  Filled 2019-05-16: qty 1

## 2019-05-16 MED ORDER — FENTANYL CITRATE (PF) 100 MCG/2ML IJ SOLN
100.0000 ug | Freq: Once | INTRAMUSCULAR | Status: AC
  Administered 2019-05-16: 100 ug via INTRAMUSCULAR
  Filled 2019-05-16: qty 2

## 2019-05-16 NOTE — ED Triage Notes (Signed)
Pt coming from [facility] home via [facility], EMS reports that during her PT today at the facility pt fell getting up from the bed and hit her head on the tile floor. Pt does have left sided deficits from a previous CVA. Pt does have a significant hematoma to her left eyebrow, bleeding was controlled. EMS reports no LOC, no blood thinners and that pt is CA&Ox4 c/o low back pain. EMS reports that upon assessment they noticed a bright red rash on her low back. Did place c-collar on. EMS reports pt in afib with hx of same at a controlled rate of 110-120.

## 2019-05-16 NOTE — ED Provider Notes (Signed)
Emergency Department Provider Note   I have reviewed the triage vital signs and the nursing notes.   HISTORY  Chief Complaint Fall   HPI Adrienne Stone is a 73 y.o. female with PMH of HLD, HTN, and CVA with left side weakness [facility].  Patient states that she was sitting on the edge of the bed when she lost her balance and fell over striking the left side of her head and face on the ground.  No loss of consciousness.  Patient is describing pain over the left eye without vision change.  She is also experiencing some neck and mid back pain.  Denies chest pain or shortness of breath.  No recent fevers.  No new weakness.    Past Medical History:  Diagnosis Date   Depression    Hypercholesteremia    Hypertension     Patient Active Problem List   Diagnosis Date Noted   Dysphagia    Oropharyngeal dysphagia    Cerebrovascular accident (CVA) (HCC)    Goals of care, counseling/discussion    Palliative care by specialist    Acute ischemic stroke (HCC) 12/03/2018   History of GI bleed 12/03/2018   Depression 12/03/2018   Hypertension 12/03/2018   Gastric ulcer with hemorrhage    Melena    Unspecified atrial fibrillation (HCC) 11/10/2018   Hypertensive urgency 11/05/2018   Anemia 11/05/2018    Past Surgical History:  Procedure Laterality Date   ABDOMINAL HYSTERECTOMY     BIOPSY  11/17/2018   Procedure: BIOPSY;  Surgeon: Benancio DeedsArmbruster, Steven P, MD;  Location: [facility];  Service: Gastroenterology;;   CHOLECYSTECTOMY     ESOPHAGOGASTRODUODENOSCOPY (EGD) WITH PROPOFOL N/A 11/17/2018   Procedure: ESOPHAGOGASTRODUODENOSCOPY (EGD) WITH PROPOFOL;  Surgeon: Benancio DeedsArmbruster, Steven P, MD;  Location: [facility];  Service: Gastroenterology;  Laterality: N/A;   GASTRIC BYPASS     thumb surgery      Allergies Demerol [meperidine], Penicillins, Prozac [fluoxetine hcl], Serzone [nefazodone], and Sulfa antibiotics  No family history on  file.  Social History Social History   Tobacco Use   Smoking status: Former Smoker   Smokeless tobacco: Never Used  Substance Use Topics   Alcohol use: Yes    Comment: daily   Drug use: No    Review of Systems  Constitutional: No fever/chills Eyes: No visual changes. Positive left eye pain and swelling.  ENT: No sore throat. Cardiovascular: Denies chest pain. Respiratory: Denies shortness of breath. Gastrointestinal: No abdominal pain.  No nausea, no vomiting.  No diarrhea.  No constipation. Genitourinary: Negative for dysuria. Musculoskeletal: Negative for back pain. Skin: Negative for rash. Abrasion over the left eye.  Neurological: Negative for focal weakness or numbness. Positive HA.   10-point ROS otherwise negative.  ____________________________________________   PHYSICAL EXAM:  VITAL SIGNS: ED Triage Vitals  Enc Vitals Group     BP 05/16/19 1107 (!) 154/119     Pulse Rate 05/16/19 1107 (!) 117     Resp 05/16/19 1107 18     Temp 05/16/19 1107 97.9 F (36.6 C)     Temp Source 05/16/19 1107 Oral     SpO2 05/16/19 1107 93 %     Weight 05/16/19 1109 207 lb (93.9 kg)     Height 05/16/19 1109 5\' 3"  (1.6 m)     Pain Score 05/16/19 1108 10   Constitutional: Alert and oriented. Well appearing and in no acute distress. Eyes: Conjunctivae are normal. PERRL. EOMI without pain. Supraorbital swelling on  the left with abrasion. No laceration.  Head: Atraumatic with exception of supraorbital swelling above.  Nose: No congestion/rhinnorhea. Mouth/Throat: Mucous membranes are moist.  Neck: No stridor. C-collar in place.  Cardiovascular: Tachycardia. Good peripheral circulation. Grossly normal heart sounds.   Respiratory: Normal respiratory effort.  No retractions. Lungs CTAB. Gastrointestinal: Soft and nontender. No distention.  Musculoskeletal: No gross deformities of extremities. Neurologic:  Normal speech and language. Dense left hemiparesis with right gaze  (baseline).  Skin:  Skin is warm, dry and intact. No rash noted.  ____________________________________________  RADIOLOGY  Ct Head Wo Contrast  Result Date: 05/16/2019 CLINICAL DATA:  Fall, head injury EXAM: CT HEAD WITHOUT CONTRAST CT MAXILLOFACIAL WITHOUT CONTRAST CT CERVICAL SPINE WITHOUT CONTRAST TECHNIQUE: Multidetector CT imaging of the head, cervical spine, and maxillofacial structures were performed using the standard protocol without intravenous contrast. Multiplanar CT image reconstructions of the cervical spine and maxillofacial structures were also generated. COMPARISON:  CT head 12/04/2018 FINDINGS: CT HEAD FINDINGS Brain: Generalized atrophy. Large chronic infarct right basal ganglia. Chronic microvascular ischemic changes in the white matter. Negative for acute infarct, hemorrhage, or mass. Vascular: Negative for hyperdense vessel Skull: Negative for skull fracture. Other: Large acute hematoma left frontal scalp. CT MAXILLOFACIAL FINDINGS Osseous: Chronic fracture left orbital floor unchanged from prior studies. Mild depression. No acute facial fracture. Periapical lucency around left upper bicuspid with associated caries. Orbits: Supraorbital hematoma on the left. Chronic fracture left orbital floor. Sinuses: Mild mucosal edema paranasal sinuses.  No air-fluid level. Soft tissues: Supraorbital hematoma on the left. CT CERVICAL SPINE FINDINGS Alignment: 3 mm anterolisthesis C6-7 due to advanced facet degeneration. Skull base and vertebrae: Negative for cervical spine fracture Soft tissues and spinal canal: Benign-appearing calcification left thyroid. No adenopathy in the neck. Disc levels: Advanced facet degeneration in the spine bilaterally. Moderate disc degeneration throughout the cervical spine. Upper chest: Apically scarring bilaterally unchanged. Other: None IMPRESSION: 1. No acute intracranial abnormality. Atrophy and chronic ischemia. Large chronic infarct right basal ganglia 2.  Negative for acute facial fracture. Chronic fracture left orbital floor 3. Large left supraorbital scalp hematoma 4. Negative for cervical spine fracture. Advanced disc and facet degeneration diffusely. Electronically Signed   By: Franchot Gallo M.D.   On: 05/16/2019 13:42   Ct Cervical Spine Wo Contrast  Result Date: 05/16/2019 CLINICAL DATA:  Fall, head injury EXAM: CT HEAD WITHOUT CONTRAST CT MAXILLOFACIAL WITHOUT CONTRAST CT CERVICAL SPINE WITHOUT CONTRAST TECHNIQUE: Multidetector CT imaging of the head, cervical spine, and maxillofacial structures were performed using the standard protocol without intravenous contrast. Multiplanar CT image reconstructions of the cervical spine and maxillofacial structures were also generated. COMPARISON:  CT head 12/04/2018 FINDINGS: CT HEAD FINDINGS Brain: Generalized atrophy. Large chronic infarct right basal ganglia. Chronic microvascular ischemic changes in the white matter. Negative for acute infarct, hemorrhage, or mass. Vascular: Negative for hyperdense vessel Skull: Negative for skull fracture. Other: Large acute hematoma left frontal scalp. CT MAXILLOFACIAL FINDINGS Osseous: Chronic fracture left orbital floor unchanged from prior studies. Mild depression. No acute facial fracture. Periapical lucency around left upper bicuspid with associated caries. Orbits: Supraorbital hematoma on the left. Chronic fracture left orbital floor. Sinuses: Mild mucosal edema paranasal sinuses.  No air-fluid level. Soft tissues: Supraorbital hematoma on the left. CT CERVICAL SPINE FINDINGS Alignment: 3 mm anterolisthesis C6-7 due to advanced facet degeneration. Skull base and vertebrae: Negative for cervical spine fracture Soft tissues and spinal canal: Benign-appearing calcification left thyroid. No adenopathy in the neck. Disc levels: Advanced facet degeneration  in the spine bilaterally. Moderate disc degeneration throughout the cervical spine. Upper chest: Apically scarring  bilaterally unchanged. Other: None IMPRESSION: 1. No acute intracranial abnormality. Atrophy and chronic ischemia. Large chronic infarct right basal ganglia 2. Negative for acute facial fracture. Chronic fracture left orbital floor 3. Large left supraorbital scalp hematoma 4. Negative for cervical spine fracture. Advanced disc and facet degeneration diffusely. Electronically Signed   By: Marlan Palauharles  Clark M.D.   On: 05/16/2019 13:42   Ct Thoracic Spine Wo Contrast  Result Date: 05/16/2019 CLINICAL DATA:  Fall from bed.  Neck and back pain. EXAM: CT THORACIC SPINE WITHOUT CONTRAST TECHNIQUE: Multidetector CT images of the thoracic were obtained using the standard protocol without intravenous contrast. COMPARISON:  Thoracic spine radiographs 12/04/2018. Chest CT 11/04/2018. Lumbar radiographs 02/15/2018. FINDINGS: Alignment: The alignment is stable and near anatomic. There is a mild upper thoracic kyphosis without focal angulation. There is a slight degenerative anterolisthesis at C6-7. Vertebrae: No evidence of acute fracture or traumatic subluxation. There are small ribs bilaterally at the L1 level. There is a chronic appearing superior endplate compression deformity at L2. This is unchanged from previous lumbar radiographs. Paraspinal and other soft tissues: The paraspinal soft tissues appear normal. There is no paraspinal hematoma. Small right-greater-than-left pleural effusions are present. There is mild diffuse atherosclerosis of the aorta, great vessels and coronary arteries. There is chronic lung disease with diffuse ground-glass and interstitial opacities superimposed on emphysema, similar to previous chest CT. No consolidation or focal mass lesion identified. Disc levels: Mild degenerative changes throughout the thoracic spine with paraspinal osteophytes. No large disc herniation or high-grade spinal stenosis identified. IMPRESSION: 1. No evidence of acute thoracic spine injury. 2. Stable mild thoracic  spondylosis with endplate osteophytes. No significant spinal stenosis. 3. Chronic superior endplate compression deformity at L2. Transitional thoracolumbar anatomy. 4. Stable chronic lung disease with small right-greater-than-left pleural effusions. Electronically Signed   By: Carey BullocksWilliam  Veazey M.D.   On: 05/16/2019 12:37   Ct Maxillofacial Wo Contrast  Result Date: 05/16/2019 CLINICAL DATA:  Fall, head injury EXAM: CT HEAD WITHOUT CONTRAST CT MAXILLOFACIAL WITHOUT CONTRAST CT CERVICAL SPINE WITHOUT CONTRAST TECHNIQUE: Multidetector CT imaging of the head, cervical spine, and maxillofacial structures were performed using the standard protocol without intravenous contrast. Multiplanar CT image reconstructions of the cervical spine and maxillofacial structures were also generated. COMPARISON:  CT head 12/04/2018 FINDINGS: CT HEAD FINDINGS Brain: Generalized atrophy. Large chronic infarct right basal ganglia. Chronic microvascular ischemic changes in the white matter. Negative for acute infarct, hemorrhage, or mass. Vascular: Negative for hyperdense vessel Skull: Negative for skull fracture. Other: Large acute hematoma left frontal scalp. CT MAXILLOFACIAL FINDINGS Osseous: Chronic fracture left orbital floor unchanged from prior studies. Mild depression. No acute facial fracture. Periapical lucency around left upper bicuspid with associated caries. Orbits: Supraorbital hematoma on the left. Chronic fracture left orbital floor. Sinuses: Mild mucosal edema paranasal sinuses.  No air-fluid level. Soft tissues: Supraorbital hematoma on the left. CT CERVICAL SPINE FINDINGS Alignment: 3 mm anterolisthesis C6-7 due to advanced facet degeneration. Skull base and vertebrae: Negative for cervical spine fracture Soft tissues and spinal canal: Benign-appearing calcification left thyroid. No adenopathy in the neck. Disc levels: Advanced facet degeneration in the spine bilaterally. Moderate disc degeneration throughout the cervical  spine. Upper chest: Apically scarring bilaterally unchanged. Other: None IMPRESSION: 1. No acute intracranial abnormality. Atrophy and chronic ischemia. Large chronic infarct right basal ganglia 2. Negative for acute facial fracture. Chronic fracture left orbital floor 3. Large left  supraorbital scalp hematoma 4. Negative for cervical spine fracture. Advanced disc and facet degeneration diffusely. Electronically Signed   By: Marlan Palauharles  Clark M.D.   On: 05/16/2019 13:42    ____________________________________________   PROCEDURES  Procedure(s) performed:   Procedures  None  ____________________________________________   INITIAL IMPRESSION / ASSESSMENT AND PLAN / ED COURSE  Pertinent labs & imaging results that were available during my care of the patient were reviewed by me and considered in my medical decision making (see chart for details).   Patient with chronic left-sided weakness [facility] to the emergency department with face/head injury after fall.  Complaining of neck and thoracic spine type pain as well.  Patient with both midline and paraspinal tenderness on exam.  No new neuro deficits.  CT imaging reviewed with no acute findings.  Patient does have a supraorbital hematoma with abrasion.  No laceration.  Discussed wound care with the family and provided in writing for facility.  Patient's family is at bedside.  Plan for transport back to SNF at this time. All questions answered. Patient's pain well controlled here. HR improved with home diltiazem.    ____________________________________________  FINAL CLINICAL IMPRESSION(S) / ED DIAGNOSES  Final diagnoses:  Injury of head, initial encounter  Fall, initial encounter  Strain of neck muscle, initial encounter  Thoracic myofascial strain, initial encounter     MEDICATIONS GIVEN DURING THIS VISIT:  Medications  diltiazem (CARDIZEM) tablet 60 mg (60 mg Oral Given 05/16/19 1316)  fentaNYL  (SUBLIMAZE) injection 100 mcg (100 mcg Intramuscular Given 05/16/19 1352)    Note:  This document was prepared using Dragon voice recognition software and may include unintentional dictation errors.  Alona BeneJoshua Treavor Blomquist, MD Emergency Medicine    Zoii Florer, Arlyss RepressJoshua G, MD 05/16/19 [phone]

## 2019-05-16 NOTE — Discharge Instructions (Signed)
You were seen in the emergency department today after a fall.  You may resume your home medications.  CT imaging of the head, face, neck, thoracic spine were normal.  The wound over the left eye does not require stitches.  Please keep it clean and dry.  Follow-up with the primary care physician in the coming week.

## 2019-05-16 NOTE — ED Notes (Signed)
[facility] called for transport back to [facility] 

## 2019-05-16 NOTE — ED Notes (Signed)
Waiting on pharmacy to send up diltiazem. Unable to give at scheduled time.

## 2019-08-04 ENCOUNTER — Other Ambulatory Visit: Payer: Self-pay

## 2019-08-04 ENCOUNTER — Emergency Department (HOSPITAL_COMMUNITY): Payer: Medicare HMO

## 2019-08-04 ENCOUNTER — Encounter (HOSPITAL_COMMUNITY): Payer: Self-pay

## 2019-08-04 ENCOUNTER — Inpatient Hospital Stay (HOSPITAL_COMMUNITY)
Admission: EM | Admit: 2019-08-04 | Discharge: 2019-08-08 | DRG: 871 | Disposition: A | Discharge status: Skilled Nursing Facility | Payer: Medicare HMO | Attending: Internal Medicine | Admitting: Internal Medicine

## 2019-08-04 DIAGNOSIS — F32A Depression, unspecified: Secondary | ICD-10-CM | POA: Diagnosis present

## 2019-08-04 DIAGNOSIS — Z9884 Bariatric surgery status: Secondary | ICD-10-CM

## 2019-08-04 DIAGNOSIS — F329 Major depressive disorder, single episode, unspecified: Secondary | ICD-10-CM | POA: Diagnosis present

## 2019-08-04 DIAGNOSIS — E785 Hyperlipidemia, unspecified: Secondary | ICD-10-CM | POA: Diagnosis present

## 2019-08-04 DIAGNOSIS — I10 Essential (primary) hypertension: Secondary | ICD-10-CM | POA: Diagnosis present

## 2019-08-04 DIAGNOSIS — Z87891 Personal history of nicotine dependence: Secondary | ICD-10-CM

## 2019-08-04 DIAGNOSIS — M7989 Other specified soft tissue disorders: Secondary | ICD-10-CM | POA: Diagnosis present

## 2019-08-04 DIAGNOSIS — N281 Cyst of kidney, acquired: Secondary | ICD-10-CM | POA: Diagnosis present

## 2019-08-04 DIAGNOSIS — Z7982 Long term (current) use of aspirin: Secondary | ICD-10-CM

## 2019-08-04 DIAGNOSIS — Z8249 Family history of ischemic heart disease and other diseases of the circulatory system: Secondary | ICD-10-CM

## 2019-08-04 DIAGNOSIS — A419 Sepsis, unspecified organism: Secondary | ICD-10-CM | POA: Diagnosis present

## 2019-08-04 DIAGNOSIS — K5900 Constipation, unspecified: Secondary | ICD-10-CM | POA: Diagnosis present

## 2019-08-04 DIAGNOSIS — R1312 Dysphagia, oropharyngeal phase: Secondary | ICD-10-CM | POA: Diagnosis present

## 2019-08-04 DIAGNOSIS — Z882 Allergy status to sulfonamides status: Secondary | ICD-10-CM

## 2019-08-04 DIAGNOSIS — A4151 Sepsis due to Escherichia coli [E. coli]: Secondary | ICD-10-CM | POA: Diagnosis not present

## 2019-08-04 DIAGNOSIS — Z8673 Personal history of transient ischemic attack (TIA), and cerebral infarction without residual deficits: Secondary | ICD-10-CM

## 2019-08-04 DIAGNOSIS — D649 Anemia, unspecified: Secondary | ICD-10-CM | POA: Diagnosis present

## 2019-08-04 DIAGNOSIS — Z91011 Allergy to milk products: Secondary | ICD-10-CM

## 2019-08-04 DIAGNOSIS — Z66 Do not resuscitate: Secondary | ICD-10-CM | POA: Diagnosis present

## 2019-08-04 DIAGNOSIS — Z888 Allergy status to other drugs, medicaments and biological substances status: Secondary | ICD-10-CM

## 2019-08-04 DIAGNOSIS — G9340 Encephalopathy, unspecified: Secondary | ICD-10-CM | POA: Diagnosis not present

## 2019-08-04 DIAGNOSIS — E440 Moderate protein-calorie malnutrition: Secondary | ICD-10-CM

## 2019-08-04 DIAGNOSIS — R1110 Vomiting, unspecified: Secondary | ICD-10-CM

## 2019-08-04 DIAGNOSIS — Z20828 Contact with and (suspected) exposure to other viral communicable diseases: Secondary | ICD-10-CM | POA: Diagnosis present

## 2019-08-04 DIAGNOSIS — G9341 Metabolic encephalopathy: Secondary | ICD-10-CM

## 2019-08-04 DIAGNOSIS — Z6836 Body mass index (BMI) 36.0-36.9, adult: Secondary | ICD-10-CM

## 2019-08-04 DIAGNOSIS — K922 Gastrointestinal hemorrhage, unspecified: Secondary | ICD-10-CM | POA: Diagnosis present

## 2019-08-04 DIAGNOSIS — R112 Nausea with vomiting, unspecified: Secondary | ICD-10-CM | POA: Diagnosis present

## 2019-08-04 DIAGNOSIS — I482 Chronic atrial fibrillation, unspecified: Secondary | ICD-10-CM | POA: Diagnosis present

## 2019-08-04 DIAGNOSIS — N3001 Acute cystitis with hematuria: Secondary | ICD-10-CM

## 2019-08-04 DIAGNOSIS — Z8711 Personal history of peptic ulcer disease: Secondary | ICD-10-CM

## 2019-08-04 DIAGNOSIS — Z880 Allergy status to penicillin: Secondary | ICD-10-CM

## 2019-08-04 DIAGNOSIS — N390 Urinary tract infection, site not specified: Secondary | ICD-10-CM

## 2019-08-04 DIAGNOSIS — Z8719 Personal history of other diseases of the digestive system: Secondary | ICD-10-CM

## 2019-08-04 DIAGNOSIS — E876 Hypokalemia: Secondary | ICD-10-CM | POA: Diagnosis present

## 2019-08-04 DIAGNOSIS — E7800 Pure hypercholesterolemia, unspecified: Secondary | ICD-10-CM | POA: Diagnosis present

## 2019-08-04 DIAGNOSIS — R1310 Dysphagia, unspecified: Secondary | ICD-10-CM

## 2019-08-04 DIAGNOSIS — E860 Dehydration: Secondary | ICD-10-CM | POA: Diagnosis present

## 2019-08-04 DIAGNOSIS — Z79899 Other long term (current) drug therapy: Secondary | ICD-10-CM

## 2019-08-04 DIAGNOSIS — E669 Obesity, unspecified: Secondary | ICD-10-CM | POA: Diagnosis present

## 2019-08-04 HISTORY — DX: Chronic atrial fibrillation, unspecified: I48.20

## 2019-08-04 HISTORY — DX: Cerebral infarction, unspecified: I63.9

## 2019-08-04 LAB — COMPREHENSIVE METABOLIC PANEL
ALT: 15 U/L (ref 0–44)
AST: 20 U/L (ref 15–41)
Albumin: 2.2 g/dL — ABNORMAL LOW (ref 3.5–5.0)
Alkaline Phosphatase: 43 U/L (ref 38–126)
Anion gap: 15 (ref 5–15)
BUN: 19 mg/dL (ref 8–23)
CO2: 22 mmol/L (ref 22–32)
Calcium: 8.1 mg/dL — ABNORMAL LOW (ref 8.9–10.3)
Chloride: 102 mmol/L (ref 98–111)
Creatinine, Ser: 0.81 mg/dL (ref 0.44–1.00)
GFR calc Af Amer: >60 mL/min (ref >60)
GFR calc non Af Amer: >60 mL/min (ref >60)
Glucose, Bld: 89 mg/dL (ref 70–99)
Potassium: 3.1 mmol/L — ABNORMAL LOW (ref 3.5–5.1)
Sodium: 139 mmol/L (ref 135–145)
Total Bilirubin: 1.4 mg/dL — ABNORMAL HIGH (ref 0.3–1.2)
Total Protein: 5 g/dL — ABNORMAL LOW (ref 6.5–8.1)

## 2019-08-04 LAB — CBC WITH DIFFERENTIAL/PLATELET
Abs Immature Granulocytes: 0.04 10*3/uL (ref 0.00–0.07)
Basophils Absolute: 0 10*3/uL (ref 0.0–0.1)
Basophils Relative: 0 %
Eosinophils Absolute: 0.1 10*3/uL (ref 0.0–0.5)
Eosinophils Relative: 1 %
HCT: 35 % — ABNORMAL LOW (ref 36.0–46.0)
Hemoglobin: 11.3 g/dL — ABNORMAL LOW (ref 12.0–15.0)
Immature Granulocytes: 1 %
Lymphocytes Relative: 14 %
Lymphs Abs: 1.1 10*3/uL (ref 0.7–4.0)
MCH: 28.8 pg (ref 26.0–34.0)
MCHC: 32.3 g/dL (ref 30.0–36.0)
MCV: 89.1 fL (ref 80.0–100.0)
Monocytes Absolute: 0.5 10*3/uL (ref 0.1–1.0)
Monocytes Relative: 7 %
Neutro Abs: 5.9 10*3/uL (ref 1.7–7.7)
Neutrophils Relative %: 77 %
Platelets: 222 10*3/uL (ref 150–400)
RBC: 3.93 MIL/uL (ref 3.87–5.11)
RDW: 14.6 % (ref 11.5–15.5)
WBC: 7.6 10*3/uL (ref 4.0–10.5)
nRBC: 0 % (ref 0.0–0.2)

## 2019-08-04 LAB — URINALYSIS, ROUTINE W REFLEX MICROSCOPIC
Glucose, UA: NEGATIVE mg/dL
Ketones, ur: 20 mg/dL — AB
Nitrite: NEGATIVE
Protein, ur: 30 mg/dL — AB
RBC / HPF: >50 RBC/hpf — ABNORMAL HIGH (ref 0–5)
Specific Gravity, Urine: >1.046 — ABNORMAL HIGH (ref 1.005–1.030)
WBC, UA: >50 WBC/hpf — ABNORMAL HIGH (ref 0–5)
pH: 6 (ref 5.0–8.0)

## 2019-08-04 LAB — TSH: TSH: 1.297 u[IU]/mL (ref 0.350–4.500)

## 2019-08-04 LAB — TROPONIN I (HIGH SENSITIVITY)
Troponin I (High Sensitivity): 4 ng/L (ref <18)
Troponin I (High Sensitivity): 5 ng/L (ref <18)

## 2019-08-04 LAB — CBG MONITORING, ED: Glucose-Capillary: 93 mg/dL (ref 70–99)

## 2019-08-04 LAB — AMMONIA: Ammonia: 33 umol/L (ref 9–35)

## 2019-08-04 LAB — LACTIC ACID, PLASMA: Lactic Acid, Venous: 1.1 mmol/L (ref 0.5–1.9)

## 2019-08-04 LAB — LIPASE, BLOOD: Lipase: 25 U/L (ref 11–51)

## 2019-08-04 MED ORDER — THIAMINE HCL 100 MG/ML IJ SOLN
100.0000 mg | Freq: Once | INTRAMUSCULAR | Status: AC
  Administered 2019-08-04: 100 mg via INTRAVENOUS
  Filled 2019-08-04: qty 2

## 2019-08-04 MED ORDER — SODIUM CHLORIDE 0.9 % IV SOLN
1.0000 g | Freq: Once | INTRAVENOUS | Status: AC
  Administered 2019-08-04: 1 g via INTRAVENOUS
  Filled 2019-08-04: qty 10

## 2019-08-04 MED ORDER — METOCLOPRAMIDE HCL 5 MG/ML IJ SOLN
10.0000 mg | Freq: Once | INTRAMUSCULAR | Status: AC
  Administered 2019-08-04: 10 mg via INTRAVENOUS
  Filled 2019-08-04: qty 2

## 2019-08-04 MED ORDER — IOHEXOL 300 MG/ML  SOLN
100.0000 mL | Freq: Once | INTRAMUSCULAR | Status: AC | PRN
  Administered 2019-08-04: 100 mL via INTRAVENOUS

## 2019-08-04 NOTE — ED Notes (Signed)
Patient transported to CT 

## 2019-08-04 NOTE — ED Provider Notes (Signed)
[facility] Provider Note   CSN: [id number] Arrival date & time: 08/04/19  1629     History   Chief Complaint Chief Complaint  Patient presents with  . Nausea    HPI Adrienne Stone is a 73 y.o. female who presents with cc of vomiting. She presents via EMS for nausea and vomiting.  History is gathered from the patient, EMS and EMR.  EMS reports that facility staff felt she was altered however patient appears to be alert and oriented here.  She reports that she has had 3 weeks of nausea and vomiting.  She has been unable to eat any food at all for the past 2 weeks and has had an IV in place she thinks for the past 5 days.  She complains of severe nausea, gagging and inability to tolerate anything by mouth.  Patient has been receiving IV fluids in the right arm.  She has a history of previous gastric ulcer and GI bleed.  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  She denies any abdominal pain, melena or hematochezia.  She has never had anything like this before. SHe has had a slight cough.      HPI  Past Medical History:  Diagnosis Date  . Depression   . Hypercholesteremia   . Hypertension     Patient Active Problem List   Diagnosis Date Noted  . Dysphagia   . Oropharyngeal dysphagia   . Cerebrovascular accident (CVA) (HCC)   . Goals of care, counseling/discussion   . Palliative care by specialist   . Acute ischemic stroke (HCC) 12/03/2018  . History of GI bleed 12/03/2018  . Depression 12/03/2018  . Hypertension 12/03/2018  . Gastric ulcer with hemorrhage   . Melena   . Unspecified atrial fibrillation (HCC) 11/10/2018  . Hypertensive urgency 11/05/2018  . Anemia 11/05/2018    Past Surgical History:  Procedure Laterality Date  . ABDOMINAL HYSTERECTOMY    . BIOPSY  11/17/2018   Procedure: BIOPSY;  Surgeon: Benancio DeedsArmbruster, Steven P, MD;  [facility];  Service:  Gastroenterology;;  . CHOLECYSTECTOMY    . ESOPHAGOGASTRODUODENOSCOPY (EGD) WITH PROPOFOL N/A 11/17/2018   Procedure: ESOPHAGOGASTRODUODENOSCOPY (EGD) WITH PROPOFOL;  Surgeon: Benancio DeedsArmbruster, Steven P, MD;  [facility];  Service: Gastroenterology;  Laterality: N/A;  . GASTRIC BYPASS    . thumb surgery       OB History   No obstetric history on file.      Home Medications    Prior to Admission medications   Medication Sig Start Date End Date Taking? Authorizing Provider  acetaminophen (TYLENOL) 500 MG tablet Take 1,000 mg by mouth every 8 (eight) hours as needed for mild pain.    [provider]  aspirin 325 MG tablet Take 1 tablet (325 mg total) by mouth daily. 12/26/18   Mikhail, Nita SellsMaryann, DO  atorvastatin (LIPITOR) 40 MG tablet Take 1 tablet (40 mg total) by mouth daily at 6 PM. 12/25/18   Edsel PetrinMikhail, Maryann, DO  Calcium Carbonate-Vit D-Min (CALCIUM 1200 PO) Take 1,200 mg by mouth daily.     [provider]  diltiazem (CARDIZEM) 60 MG tablet Take 1 tablet (60 mg total) by mouth every 6 (six) hours. Patient taking differently: Take 60 mg by mouth 2 (two) times daily.  12/25/18   Mikhail, Nita SellsMaryann, DO  guaiFENesin (MUCINEX) 600 MG 12 hr tablet Take 600 mg by mouth 2 (two) times daily.  [provider]  HYDROcodone-acetaminophen (NORCO/VICODIN) 5-325 MG tablet Take 1 tablet by mouth every 6 (six) hours as needed for moderate pain. Patient not taking: Reported on 05/16/2019 12/25/18   Edsel Petrin, DO  hydroxypropyl methylcellulose / hypromellose (ISOPTO TEARS / GONIOVISC) 2.5 % ophthalmic solution Place 1 drop into both eyes 3 (three) times daily as needed for dry eyes.    [provider]  lidocaine (LIDODERM) 5 % Place 1 patch onto the skin daily. Remove & Discard patch within 12 hours or as directed by MD Patient not taking: Reported on 05/16/2019 12/03/18   Donnetta Hutching, MD  Lidocaine 4 % PTCH Apply 1 patch topically daily. To the mid-back     [provider]  LORazepam (ATIVAN) 0.5 MG tablet Take 1 tablet (0.5 mg total) by mouth at bedtime as needed for anxiety. Patient not taking: Reported on 05/16/2019 12/25/18   Edsel Petrin, DO  Magnesium 250 MG TABS Take 250 mg by mouth daily.     [provider]  Melatonin 3 MG TABS Take 3 mg by mouth at bedtime.    [provider]  metoprolol succinate (TOPROL-XL) 50 MG 24 hr tablet Take 50 mg by mouth daily. Take with or immediately following a meal.    [provider]  metoprolol tartrate (LOPRESSOR) 50 MG tablet Take 1 tablet (50 mg total) by mouth 2 (two) times daily. Patient not taking: Reported on 05/16/2019 12/25/18   Edsel Petrin, DO  mirtazapine (REMERON) 15 MG tablet Take 1 tablet (15 mg total) by mouth at bedtime. Patient not taking: Reported on 05/16/2019 12/25/18   Edsel Petrin, DO  omeprazole (PRILOSEC) 40 MG capsule crack open and ingest contents to maximize absorption of PPI given your gastric bypass state Patient taking differently: Take 40 mg by mouth See admin instructions. crack open and ingest contents to maximize absorption of PPI given your gastric bypass state 11/19/18   Burnadette Pop, MD  polyethylene glycol powder (GLYCOLAX/MIRALAX) powder Take 17 g by mouth daily as needed for mild constipation.    [provider]  QUEtiapine (SEROQUEL) 25 MG tablet Take 1 tablet (25 mg total) by mouth at bedtime. Patient not taking: Reported on 05/16/2019 12/25/18   Edsel Petrin, DO  senna-docusate (SENOKOT-S) 8.6-50 MG tablet Take 1 tablet by mouth at bedtime as needed for mild constipation. 12/25/18   Mikhail, Nita Sells, DO  sertraline (ZOLOFT) 100 MG tablet Take 100 mg by mouth daily.    [provider]  traZODone (DESYREL) 100 MG tablet Take 100 mg by mouth at bedtime.    [provider]  vitamin C (ASCORBIC ACID) 500 MG tablet Take 1,000 mg by mouth daily.     [provider]    Family History History  reviewed. No pertinent family history.  Social History Social History   Tobacco Use  . Smoking status: Former Games developer  . Smokeless tobacco: Never Used  Substance Use Topics  . Alcohol use: Yes    Comment: daily  . Drug use: No     Allergies   Demerol [meperidine], Penicillins, Prozac [fluoxetine hcl], Serzone [nefazodone], and Sulfa antibiotics   Review of Systems Review of Systems  Ten systems reviewed and are negative for acute change, except as noted in the HPI.   Physical Exam Updated Vital Signs BP 136/89 (BP Location: Right Arm)   Pulse 87   Temp 98.4 F (36.9 C) (Oral)   Resp 18   SpO2 98%   Physical Exam Vitals signs and  nursing note reviewed.  Constitutional:      General: She is not in acute distress.    Appearance: She is well-developed. She is not diaphoretic.  HENT:     Head: Normocephalic and atraumatic.  Eyes:     General: No scleral icterus.    Conjunctiva/sclera: Conjunctivae normal.  Neck:     Musculoskeletal: Normal range of motion.  Cardiovascular:     Rate and Rhythm: Normal rate and regular rhythm.     Heart sounds: Normal heart sounds. No murmur. No friction rub. No gallop.   Pulmonary:     Effort: Pulmonary effort is normal. No respiratory distress.     Breath sounds: Normal breath sounds.  Abdominal:     General: Bowel sounds are normal. There is no distension.     Palpations: Abdomen is soft. There is no mass.     Tenderness: There is no abdominal tenderness. There is no guarding.  Skin:    General: Skin is warm and dry.  Neurological:     Mental Status: She is alert and oriented to person, place, and time.  Psychiatric:        Behavior: Behavior normal.      ED Treatments / Results  Labs (all labs ordered are listed, but only abnormal results are displayed) Labs Reviewed  SARS CORONAVIRUS 2 (TAT 6-24 HRS)  CBC WITH DIFFERENTIAL/PLATELET  COMPREHENSIVE METABOLIC PANEL  URINALYSIS, ROUTINE W REFLEX MICROSCOPIC  AMMONIA   LIPASE, BLOOD  LACTIC ACID, PLASMA  TSH  CBG MONITORING, ED  TROPONIN I (HIGH SENSITIVITY)    EKG None  Radiology No results found.  Procedures Procedures (including critical care time)  Medications Ordered in ED Medications  thiamine (B-1) injection 100 mg (has no administration in time range)  metoCLOPramide (REGLAN) injection 10 mg (has no administration in time range)     Initial Impression / Assessment and Plan / ED Course  I have reviewed the triage vital signs and the nursing notes.  Pertinent labs & imaging results that were available during my care of the patient were reviewed by me and considered in my medical decision making (see chart for details).        10:42 PM BP 128/76   Pulse 96   Temp 98.4 F (36.9 C) (Oral)   Resp 19   SpO2 92%  73 y/o F here with c/o nausea and vomiting. The emergent differential diagnosis for vomiting includes, but is not limited to ACS/MI, Boerhaave's, DKA, Intracranial Hemorrhage, Ischemic bowel, Meningitis, Sepsis, Acute radiation syndrome, Acute gastric dilation, Acetaminophen toxicity, Adrenal insufficiency, Appendicitis, Aspirin toxicity, Bowel obstruction/ileus, Carbon monoxide poisoning, Cholecystitis, CNS tumor. Digoxin toxicity, Electrolyte abnormalities, Elevated ICP, Gastric outlet obstruction, Hyperemesis gravidarum, Pancreatitis, Peritonitis, Ruptured viscus, Testicular torsion/ovarian torsion, Theophyline toxicity, Biliary colic, Cannabinoid hyperemesis syndrome, Chemotherapy, Disulfiram effect, Erythromycin, ETOH, Gastritis, Gastroenteritis, Gastroparesis, Hepatitis, Ibuprofen, Ipecac toxicity, Labyrinthitis, Migraine, Motion sickness, Narcotic withdrawal, Thyroid, Pregnancy, Peptic ulcer disease, Renal colic, and UTI Patients labs show mild normocytic anemia, patient is mildly hypokalemic, TSH is within normal limits.  Normal troponin level, normal lactic acid, ammonia level.  Patient's CT scan which I personally  reviewed shows no evidence of bowel obstruction on my interpretation however patient does have bilateral cystic lesions within the kidneys with large abnormally shaped cystic lesion on the right kidney which has enlarged from previous scans.  Patient urine has returned with obvious infection she will be treated with antibiotics but will need admission for intractable vomiting.  Final Clinical Impressions(s) / ED Diagnoses  Final diagnoses:  Acute cystitis with hematuria  Intractable vomiting, presence of nausea not specified, unspecified vomiting type    ED Discharge Orders    None       Arthor Captain, PA-C 08/04/19 2251    Tegeler, Canary Brim, MD 08/06/19 0100

## 2019-08-04 NOTE — ED Triage Notes (Signed)
Per [facility], pt from [facility] with complaint of 2 week hx of nausea, general malaise and not eating well. No vomiting. EMS called due to pt having ams that began this morning. Pt answering all questions appropriately with EMS and this RN. VSS.

## 2019-08-05 ENCOUNTER — Encounter (HOSPITAL_COMMUNITY): Payer: Self-pay | Admitting: Internal Medicine

## 2019-08-05 ENCOUNTER — Emergency Department (HOSPITAL_COMMUNITY): Payer: Medicare HMO

## 2019-08-05 DIAGNOSIS — A4151 Sepsis due to Escherichia coli [E. coli]: Secondary | ICD-10-CM | POA: Diagnosis present

## 2019-08-05 DIAGNOSIS — G9340 Encephalopathy, unspecified: Secondary | ICD-10-CM | POA: Diagnosis present

## 2019-08-05 DIAGNOSIS — I482 Chronic atrial fibrillation, unspecified: Secondary | ICD-10-CM | POA: Diagnosis present

## 2019-08-05 DIAGNOSIS — E860 Dehydration: Secondary | ICD-10-CM

## 2019-08-05 DIAGNOSIS — F329 Major depressive disorder, single episode, unspecified: Secondary | ICD-10-CM | POA: Diagnosis present

## 2019-08-05 DIAGNOSIS — I10 Essential (primary) hypertension: Secondary | ICD-10-CM | POA: Diagnosis present

## 2019-08-05 DIAGNOSIS — Z6836 Body mass index (BMI) 36.0-36.9, adult: Secondary | ICD-10-CM | POA: Diagnosis not present

## 2019-08-05 DIAGNOSIS — R638 Other symptoms and signs concerning food and fluid intake: Secondary | ICD-10-CM | POA: Diagnosis not present

## 2019-08-05 DIAGNOSIS — Z87891 Personal history of nicotine dependence: Secondary | ICD-10-CM | POA: Diagnosis not present

## 2019-08-05 DIAGNOSIS — Z66 Do not resuscitate: Secondary | ICD-10-CM | POA: Diagnosis present

## 2019-08-05 DIAGNOSIS — Z20828 Contact with and (suspected) exposure to other viral communicable diseases: Secondary | ICD-10-CM | POA: Diagnosis present

## 2019-08-05 DIAGNOSIS — N3001 Acute cystitis with hematuria: Secondary | ICD-10-CM

## 2019-08-05 DIAGNOSIS — E876 Hypokalemia: Secondary | ICD-10-CM | POA: Diagnosis present

## 2019-08-05 DIAGNOSIS — Z8249 Family history of ischemic heart disease and other diseases of the circulatory system: Secondary | ICD-10-CM | POA: Diagnosis not present

## 2019-08-05 DIAGNOSIS — G9341 Metabolic encephalopathy: Secondary | ICD-10-CM | POA: Diagnosis present

## 2019-08-05 DIAGNOSIS — R1310 Dysphagia, unspecified: Secondary | ICD-10-CM

## 2019-08-05 DIAGNOSIS — D649 Anemia, unspecified: Secondary | ICD-10-CM

## 2019-08-05 DIAGNOSIS — A419 Sepsis, unspecified organism: Secondary | ICD-10-CM | POA: Diagnosis present

## 2019-08-05 DIAGNOSIS — M7989 Other specified soft tissue disorders: Secondary | ICD-10-CM | POA: Diagnosis not present

## 2019-08-05 DIAGNOSIS — N390 Urinary tract infection, site not specified: Secondary | ICD-10-CM | POA: Diagnosis not present

## 2019-08-05 DIAGNOSIS — N281 Cyst of kidney, acquired: Secondary | ICD-10-CM | POA: Diagnosis present

## 2019-08-05 DIAGNOSIS — Z8711 Personal history of peptic ulcer disease: Secondary | ICD-10-CM | POA: Diagnosis not present

## 2019-08-05 DIAGNOSIS — R1312 Dysphagia, oropharyngeal phase: Secondary | ICD-10-CM | POA: Diagnosis present

## 2019-08-05 DIAGNOSIS — R6520 Severe sepsis without septic shock: Secondary | ICD-10-CM | POA: Diagnosis not present

## 2019-08-05 DIAGNOSIS — R1110 Vomiting, unspecified: Secondary | ICD-10-CM

## 2019-08-05 DIAGNOSIS — Z8719 Personal history of other diseases of the digestive system: Secondary | ICD-10-CM

## 2019-08-05 DIAGNOSIS — Z9884 Bariatric surgery status: Secondary | ICD-10-CM | POA: Diagnosis not present

## 2019-08-05 DIAGNOSIS — K922 Gastrointestinal hemorrhage, unspecified: Secondary | ICD-10-CM | POA: Diagnosis present

## 2019-08-05 DIAGNOSIS — E669 Obesity, unspecified: Secondary | ICD-10-CM | POA: Diagnosis present

## 2019-08-05 DIAGNOSIS — E785 Hyperlipidemia, unspecified: Secondary | ICD-10-CM | POA: Diagnosis present

## 2019-08-05 DIAGNOSIS — Z8673 Personal history of transient ischemic attack (TIA), and cerebral infarction without residual deficits: Secondary | ICD-10-CM | POA: Diagnosis not present

## 2019-08-05 DIAGNOSIS — E440 Moderate protein-calorie malnutrition: Secondary | ICD-10-CM | POA: Diagnosis present

## 2019-08-05 LAB — COMPREHENSIVE METABOLIC PANEL
ALT: 15 U/L (ref 0–44)
AST: 17 U/L (ref 15–41)
Albumin: 2.1 g/dL — ABNORMAL LOW (ref 3.5–5.0)
Alkaline Phosphatase: 41 U/L (ref 38–126)
Anion gap: 6 (ref 5–15)
BUN: 18 mg/dL (ref 8–23)
CO2: 22 mmol/L (ref 22–32)
Calcium: 7.5 mg/dL — ABNORMAL LOW (ref 8.9–10.3)
Chloride: 107 mmol/L (ref 98–111)
Creatinine, Ser: 0.63 mg/dL (ref 0.44–1.00)
GFR calc Af Amer: >60 mL/min (ref >60)
GFR calc non Af Amer: >60 mL/min (ref >60)
Glucose, Bld: 77 mg/dL (ref 70–99)
Potassium: 3.2 mmol/L — ABNORMAL LOW (ref 3.5–5.1)
Sodium: 135 mmol/L (ref 135–145)
Total Bilirubin: 1 mg/dL (ref 0.3–1.2)
Total Protein: 4.6 g/dL — ABNORMAL LOW (ref 6.5–8.1)

## 2019-08-05 LAB — TSH: TSH: 1.956 u[IU]/mL (ref 0.350–4.500)

## 2019-08-05 LAB — PHOSPHORUS: Phosphorus: 2.8 mg/dL (ref 2.5–4.6)

## 2019-08-05 LAB — CBC
HCT: 34.8 % — ABNORMAL LOW (ref 36.0–46.0)
Hemoglobin: 11 g/dL — ABNORMAL LOW (ref 12.0–15.0)
MCH: 28.6 pg (ref 26.0–34.0)
MCHC: 31.6 g/dL (ref 30.0–36.0)
MCV: 90.6 fL (ref 80.0–100.0)
Platelets: 223 10*3/uL (ref 150–400)
RBC: 3.84 MIL/uL — ABNORMAL LOW (ref 3.87–5.11)
RDW: 14.6 % (ref 11.5–15.5)
WBC: 11.4 10*3/uL — ABNORMAL HIGH (ref 4.0–10.5)
nRBC: 0 % (ref 0.0–0.2)

## 2019-08-05 LAB — PREALBUMIN: Prealbumin: 8.5 mg/dL — ABNORMAL LOW (ref 18–38)

## 2019-08-05 LAB — MAGNESIUM: Magnesium: 1.5 mg/dL — ABNORMAL LOW (ref 1.7–2.4)

## 2019-08-05 LAB — CBG MONITORING, ED: Glucose-Capillary: 78 mg/dL (ref 70–99)

## 2019-08-05 LAB — SARS CORONAVIRUS 2 (TAT 6-24 HRS): SARS Coronavirus 2: NEGATIVE

## 2019-08-05 MED ORDER — SERTRALINE HCL 100 MG PO TABS
100.0000 mg | ORAL_TABLET | Freq: Every day | ORAL | Status: DC
  Administered 2019-08-05 – 2019-08-08 (×4): 100 mg via ORAL
  Filled 2019-08-05 (×4): qty 1

## 2019-08-05 MED ORDER — MAGNESIUM SULFATE 2 GM/50ML IV SOLN
2.0000 g | Freq: Once | INTRAVENOUS | Status: AC
  Administered 2019-08-05: 2 g via INTRAVENOUS
  Filled 2019-08-05: qty 50

## 2019-08-05 MED ORDER — SODIUM CHLORIDE 0.9 % IV SOLN
INTRAVENOUS | Status: DC
  Administered 2019-08-05 (×2): via INTRAVENOUS

## 2019-08-05 MED ORDER — METOPROLOL SUCCINATE ER 25 MG PO TB24
50.0000 mg | ORAL_TABLET | Freq: Every day | ORAL | Status: DC
  Administered 2019-08-05 – 2019-08-08 (×4): 50 mg via ORAL
  Filled 2019-08-05 (×4): qty 2

## 2019-08-05 MED ORDER — METOCLOPRAMIDE HCL 5 MG/ML IJ SOLN
5.0000 mg | Freq: Four times a day (QID) | INTRAMUSCULAR | Status: DC
  Administered 2019-08-05 – 2019-08-08 (×13): 5 mg via INTRAVENOUS
  Filled 2019-08-05 (×14): qty 2

## 2019-08-05 MED ORDER — ACETAMINOPHEN 325 MG PO TABS
650.0000 mg | ORAL_TABLET | Freq: Four times a day (QID) | ORAL | Status: DC | PRN
  Administered 2019-08-05: 650 mg via ORAL
  Filled 2019-08-05: qty 2

## 2019-08-05 MED ORDER — SODIUM CHLORIDE 0.9 % IV BOLUS
1000.0000 mL | Freq: Once | INTRAVENOUS | Status: AC
  Administered 2019-08-05: 1000 mL via INTRAVENOUS

## 2019-08-05 MED ORDER — PANTOPRAZOLE SODIUM 40 MG PO TBEC
40.0000 mg | DELAYED_RELEASE_TABLET | Freq: Every day | ORAL | Status: DC
  Administered 2019-08-05: 40 mg via ORAL
  Filled 2019-08-05: qty 1

## 2019-08-05 MED ORDER — SODIUM CHLORIDE 0.9 % IV SOLN
INTRAVENOUS | Status: DC
  Administered 2019-08-05: 11:00:00 via INTRAVENOUS

## 2019-08-05 MED ORDER — POTASSIUM CHLORIDE 10 MEQ/100ML IV SOLN
10.0000 meq | INTRAVENOUS | Status: AC
  Administered 2019-08-05 (×4): 10 meq via INTRAVENOUS
  Filled 2019-08-05 (×4): qty 100

## 2019-08-05 MED ORDER — ONDANSETRON HCL 4 MG PO TABS: 4.0000 mg | ORAL_TABLET | Freq: Four times a day (QID) | ORAL | Status: DC | PRN

## 2019-08-05 MED ORDER — HYDROCODONE-ACETAMINOPHEN 5-325 MG PO TABS: 1.0000 | ORAL_TABLET | ORAL | Status: DC | PRN

## 2019-08-05 MED ORDER — SODIUM CHLORIDE 0.9 % IV SOLN
1.0000 g | INTRAVENOUS | Status: DC
  Administered 2019-08-06: 1 g via INTRAVENOUS
  Filled 2019-08-05: qty 10
  Filled 2019-08-05: qty 1

## 2019-08-05 MED ORDER — PANTOPRAZOLE SODIUM 40 MG IV SOLR: 40.0000 mg | INTRAVENOUS | Status: DC

## 2019-08-05 MED ORDER — THIAMINE HCL 100 MG/ML IJ SOLN
100.0000 mg | Freq: Every day | INTRAMUSCULAR | Status: DC
  Administered 2019-08-05: 100 mg via INTRAVENOUS
  Filled 2019-08-05: qty 2

## 2019-08-05 MED ORDER — ATORVASTATIN CALCIUM 40 MG PO TABS
40.0000 mg | ORAL_TABLET | Freq: Every day | ORAL | Status: DC
  Administered 2019-08-05 – 2019-08-07 (×3): 40 mg via ORAL
  Filled 2019-08-05 (×3): qty 1

## 2019-08-05 MED ORDER — DILTIAZEM HCL ER COATED BEADS 180 MG PO CP24
180.0000 mg | ORAL_CAPSULE | Freq: Every day | ORAL | Status: DC
  Administered 2019-08-05 – 2019-08-08 (×4): 180 mg via ORAL
  Filled 2019-08-05 (×4): qty 1

## 2019-08-05 MED ORDER — ACETAMINOPHEN 650 MG RE SUPP: 650.0000 mg | Freq: Four times a day (QID) | RECTAL | Status: DC | PRN

## 2019-08-05 MED ORDER — DILTIAZEM HCL ER 180 MG PO CP24
180.0000 mg | ORAL_CAPSULE | Freq: Every day | ORAL | Status: DC
  Filled 2019-08-05: qty 1

## 2019-08-05 MED ORDER — ASPIRIN 325 MG PO TABS
325.0000 mg | ORAL_TABLET | Freq: Every day | ORAL | Status: DC
  Administered 2019-08-05 – 2019-08-08 (×4): 325 mg via ORAL
  Filled 2019-08-05 (×4): qty 1

## 2019-08-05 MED ORDER — ONDANSETRON HCL 4 MG/2ML IJ SOLN: 4.0000 mg | Freq: Four times a day (QID) | INTRAMUSCULAR | Status: DC | PRN

## 2019-08-05 NOTE — ED Notes (Signed)
Lunch Tray Ordered @ 1053.  

## 2019-08-05 NOTE — NC FL2 (Signed)
[place] LEVEL OF CARE SCREENING TOOL     IDENTIFICATION  Patient [name] Birthdate: 05/18/1946 Sex: female Admission Date (Current Location): 08/04/2019  [facility] and [place] Number:  Herbalist and Address:  [facility], [place] [address] [place], [place] [address]      Provider Number: [id number]  Attending Physician Name and Address:  Toy Baker, MD  Relative Name and Phone Number:       Current Level of Care: Hospital Recommended Level of Care: [place] Prior Approval Number:    Date Approved/Denied:   PASRR Number: [id number]  Discharge Plan: SNF    Current Diagnoses: Patient Active Problem List   Diagnosis Date Noted  . Sepsis ([place]) 08/05/2019  . Acute lower UTI 08/05/2019  . Hypokalemia 08/05/2019  . Hypomagnesemia 08/05/2019  . Dehydration 08/05/2019  . Chronic a-fib ([place]) 08/05/2019  . Acute metabolic encephalopathy 41/93/7902  . Dysphagia   . Oropharyngeal dysphagia   . Cerebrovascular accident (CVA) ([place])   . Goals of care, counseling/discussion   . Palliative care by specialist   . Acute ischemic stroke ([place]) 12/03/2018  . History of GI bleed 12/03/2018  . Depression 12/03/2018  . Hypertension 12/03/2018  . Gastric ulcer with hemorrhage   . Melena   . Unspecified atrial fibrillation ([place]) 11/10/2018  . Hypertensive urgency 11/05/2018  . Anemia 11/05/2018    Orientation RESPIRATION BLADDER Height & Weight     Self, Time, Situation  Normal Continent Weight:   Height:     BEHAVIORAL SYMPTOMS/MOOD NEUROLOGICAL BOWEL NUTRITION STATUS      Continent Diet(see discharge summary)  AMBULATORY STATUS COMMUNICATION OF NEEDS Skin   Limited Assist Verbally Normal                       Personal Care Assistance Level of Assistance  Bathing, Feeding, Dressing Bathing Assistance: Limited assistance Feeding assistance: Independent Dressing Assistance: Limited  assistance     Functional Limitations Info  Sight, Hearing, Speech Sight Info: Impaired Hearing Info: Adequate Speech Info: Adequate    SPECIAL CARE FACTORS FREQUENCY  OT (By licensed OT), PT (By licensed PT)     PT Frequency: 5x week OT Frequency: 5x week            Contractures Contractures Info: Not present    Additional Factors Info  Code Status, Allergies, Psychotropic Code Status Info: DNR Allergies Info: Milk-related Compounds, Demerol (Meperidine), Penicillins, Prozac (Fluoxetine Hcl), Serzone (Nefazodone), Sulfa Antibiotics Psychotropic Info: sertraline (ZOLOFT) tablet 100 mg daily PO         Current Medications (08/05/2019):  This is the current hospital active medication list Current Facility-Administered Medications  Medication Dose Route Frequency Provider Last Rate Last Dose  . 0.9 %  sodium chloride infusion   Intravenous Continuous Toy Baker, MD 75 mL/hr at 08/05/19 1054    . acetaminophen (TYLENOL) tablet 650 mg  650 mg Oral Q6H PRN Toy Baker, MD   650 mg at 08/05/19 1427   Or  . acetaminophen (TYLENOL) suppository 650 mg  650 mg Rectal Q6H PRN Doutova, Anastassia, MD      . aspirin tablet 325 mg  325 mg Oral Daily Doutova, Anastassia, MD   325 mg at 08/05/19 1042  . atorvastatin (LIPITOR) tablet 40 mg  40 mg Oral q1800 Toy Baker, MD      . Derrill Memo ON 08/06/2019] cefTRIAXone (ROCEPHIN) 1 g in sodium chloride 0.9 % 100 mL  IVPB  1 g Intravenous Q24H Doutova, Anastassia, MD      . diltiazem (CARDIZEM CD) 24 hr capsule 180 mg  180 mg Oral Daily Doutova, Anastassia, MD   180 mg at 08/05/19 1043  . HYDROcodone-acetaminophen (NORCO/VICODIN) 5-325 MG per tablet 1-2 tablet  1-2 tablet Oral Q4H PRN Doutova, Anastassia, MD      . metoCLOPramide (REGLAN) injection 5 mg  5 mg Intravenous Q6H Doutova, Anastassia, MD   5 mg at 08/05/19 1204  . metoprolol succinate (TOPROL-XL) 24 hr tablet 50 mg  50 mg Oral Daily Doutova, Anastassia, MD   50  mg at 08/05/19 1043  . ondansetron (ZOFRAN) tablet 4 mg  4 mg Oral Q6H PRN Doutova, Anastassia, MD       Or  . ondansetron (ZOFRAN) injection 4 mg  4 mg Intravenous Q6H PRN Doutova, Anastassia, MD      . pantoprazole (PROTONIX) EC tablet 40 mg  40 mg Oral Daily Doutova, Anastassia, MD   40 mg at 08/05/19 1043  . sertraline (ZOLOFT) tablet 100 mg  100 mg Oral Daily Doutova, Anastassia, MD   100 mg at 08/05/19 1043  . thiamine (B-1) injection 100 mg  100 mg Intravenous Daily Doutova, Anastassia, MD   100 mg at 08/05/19 1048     Discharge Medications: Please see discharge summary for a list of discharge medications.  Relevant Imaging Results:  Relevant Lab Results:   Additional Information SS#[id number] [place], [place]

## 2019-08-05 NOTE — ED Notes (Signed)
Tele   Breakfast ordered  

## 2019-08-05 NOTE — ED Notes (Signed)
Pt c/o back pain and asked for tylenol.

## 2019-08-05 NOTE — H&P (Addendum)
Eduardo Wurth Mastropietro SKS:[id number] DOB: 01-24-1946 DOA: 08/04/2019     PCP: System, Pcp Not In   Outpatient Specialists:     GI  Armbruster, Carlota Raspberry, MD     Patient arrived to ER on 08/04/19 at [place]  Patient coming from:   From facility [place] home   Chief Complaint:   Chief Complaint  Patient presents with  . Nausea    HPI: Adrienne Stone is a 73 y.o. female with medical history significant of previous gastric ulcer and GI bleed. Roux-en-Y gastric bypass in 2000, atrial fibrillation not on anticoagulation secondary to bleeding.  History of CVA with oropharyngeal dysphagia, history of chronic anemia and GI bleeding and  Essential hypertension  have not been able to eat for 2 weeks States she have had an IV in for the past 5 days  Presented with  3 wks of nausea and Vomiting Unable to obtain detailed history secondary to confusion  Infectious risk factors:  Reports dry cough,  N/V/ Patient is from facility In  ER RAPID COVID TEST in house testing  Pending  No results found for: SARSCOV2NAA   Regarding pertinent Chronic problems:     Hyperlipidemia -  on statins  lipitor   HTN on diltiazem, toprolol  last echo 04/09/7184 systolic function of 50-15%.   History of peptic ulcer last EGD was on 11/17/2018 with ulcer at the [place] anastomosis but no visible blood vessel -GI recommended omeprazole 75m BID    obesity-   BMI Readings from Last 1 Encounters:  05/16/19 36.67 kg/m     Hx of CVA -  With  residual deficits on Aspirin , 325,  Patient should follow a dysphagia 3 diet.   A. Fib -  - CHA2DS2 vas score 5 :    Not on anticoagulation secondary to  recurrent bleeding         -  Rate control:  Currently controlled with Diltiazem,toprolol     While in ER: Patient noted to be somewhat confused unclear what her baseline is CT abd/pelvis non acute And lactic acid unremarkable Stable creatinine Normal ammonia. UA was found to have evidence of UTI she was  started on Rocephin The following Work up has been ordered so far:  Orders Placed This Encounter  Procedures  . SARS CORONAVIRUS 2 (TAT 6-24 HRS) Nasopharyngeal Nasopharyngeal Swab  . CT ABDOMEN PELVIS W CONTRAST  . CBC with Differential  . Comprehensive metabolic panel  . Urinalysis, Routine w reflex microscopic  . Ammonia  . Lipase, blood  . Lactic acid, plasma  . TSH  . Cardiac monitoring  . [place]  ALL PATIENTS BEING ADMITTED/HAVING PROCEDURES NEED COVID-19 SCREENING  . Pulse oximetry, continuous  . CBG monitoring, ED  . ED EKG  . EKG 12-Lead  . Insert peripheral IV    Following Medications were ordered in ER: Medications  cefTRIAXone (ROCEPHIN) 1 g in sodium chloride 0.9 % 100 mL IVPB (1 g Intravenous New Bag/Given 08/04/19 2351)  thiamine (B-1) injection 100 mg (100 mg Intravenous Given 08/04/19 2000)  metoCLOPramide (REGLAN) injection 10 mg (10 mg Intravenous Given 08/04/19 2000)  iohexol (OMNIPAQUE) 300 MG/ML solution 100 mL (100 mLs Intravenous Contrast Given 08/04/19 1930)    Significant initial  Findings: Abnormal Labs Reviewed  CBC WITH DIFFERENTIAL/PLATELET - Abnormal; Notable for the following components:      Result Value   Hemoglobin 11.3 (*)    HCT 35.0 (*)    All  other components within normal limits  COMPREHENSIVE METABOLIC PANEL - Abnormal; Notable for the following components:   Potassium 3.1 (*)    Calcium 8.1 (*)    Total Protein 5.0 (*)    Albumin 2.2 (*)    Total Bilirubin 1.4 (*)    All other components within normal limits  URINALYSIS, ROUTINE W REFLEX MICROSCOPIC - Abnormal; Notable for the following components:   Color, Urine AMBER (*)    APPearance TURBID (*)    Specific Gravity, Urine >1.046 (*)    Hgb urine dipstick MODERATE (*)    Bilirubin Urine SMALL (*)    Ketones, ur 20 (*)    Protein, ur 30 (*)    Leukocytes,Ua MODERATE (*)    RBC / HPF >50 (*)    WBC, UA >50 (*)    Bacteria, UA MANY (*)     All other components within normal limits    Otherwise labs showing:    Recent Labs  Lab 08/04/19 1750  NA 139  K 3.1*  CO2 22  GLUCOSE 89  BUN 19  CREATININE 0.81  CALCIUM 8.1*    Cr    stable,    Lab Results  Component Value Date   CREATININE 0.81 08/04/2019   CREATININE 0.90 12/15/2018   CREATININE 0.82 12/13/2018    Recent Labs  Lab 08/04/19 1750  AST 20  ALT 15  ALKPHOS 43  BILITOT 1.4*  PROT 5.0*  ALBUMIN 2.2*   Lab Results  Component Value Date   CALCIUM 8.1 (L) 08/04/2019   PHOS 3.9 12/09/2018     WBC      Component Value Date/Time   WBC 7.6 08/04/2019 1750   ANC    Component Value Date/Time   NEUTROABS 5.9 08/04/2019 1750   ALC No components found for: LYMPHAB    Plt: Lab Results  Component Value Date   PLT 222 08/04/2019    Lactic Acid, Venous    Component Value Date/Time   LATICACIDVEN 1.1 08/04/2019 1750    Procalcitonin   Ordered   COVID-19 Labs   No results found for: SARSCOV2NAA     HG/HCT  stable,      Component Value Date/Time   HGB 11.3 (L) 08/04/2019 1750   HCT 35.0 (L) 08/04/2019 1750    Recent Labs  Lab 08/04/19 1750  LIPASE 25   Recent Labs  Lab 08/04/19 1750  AMMONIA 33    No components found for: LABALBU   Troponin 4   Cardiac Panel (last 3 results) No results for input(s): CKTOTAL, CKMB, TROPONINI, RELINDX in the last 72 hours.     ECG: Ordered Personally reviewed by me showing: HR : 86 Rhythm:  A.fib.  ed   no evidence of ischemic changes QTC 498   DM  labs:  HbA1C: Recent Labs    12/04/18 0536  HGBA1C 4.9     CBG (last 3)  Recent Labs    08/04/19 1757  GLUCAP 93       UA  evidence of UTI    Urine analysis:    Component Value Date/Time   COLORURINE AMBER (A) 08/04/2019 2058   APPEARANCEUR TURBID (A) 08/04/2019 2058   LABSPEC >1.046 (H) 08/04/2019 2058   PHURINE 6.0 08/04/2019 2058   GLUCOSEU NEGATIVE 08/04/2019 2058   HGBUR MODERATE (A) 08/04/2019 2058    BILIRUBINUR SMALL (A) 08/04/2019 2058   KETONESUR 20 (A) 08/04/2019 2058   PROTEINUR 30 (A) 08/04/2019 2058   NITRITE NEGATIVE 08/04/2019 2058  LEUKOCYTESUR MODERATE (A) 08/04/2019 2058    Ordered   CTabd/pelvis -  nonacute    ED Triage Vitals  Enc Vitals Group     BP 08/04/19 1637 136/89     Pulse Rate 08/04/19 1637 87     Resp 08/04/19 1637 18     Temp 08/04/19 1637 98.4 F (36.9 C)     Temp Source 08/04/19 1637 Oral     SpO2 08/04/19 1637 98 %     Weight --      Height --      Head Circumference --      Peak Flow --      Pain Score 08/04/19 1626 0     Pain Loc --      Pain Edu? --      Excl. in [place]? --   TMAX(24)@       Latest  Blood pressure 136/71, pulse (!) 116, temperature 98.4 F (36.9 C), temperature source Oral, resp. rate 19, SpO2 91 %.    Hospitalist was called for admission for dehydration and UTI   Review of Systems:    Pertinent positives include: nausea, vomiting,  Constitutional:  No weight loss, night sweats, Fevers, chills, fatigue, weight loss  HEENT:  No headaches, Difficulty swallowing,Tooth/dental problems,Sore throat,  No sneezing, itching, ear ache, nasal congestion, post nasal drip,  Cardio-vascular:  No chest pain, Orthopnea, PND, anasarca, dizziness, palpitations.no Bilateral lower extremity swelling  GI:  No heartburn, indigestion, abdominal pain,  diarrhea, change in bowel habits, loss of appetite, melena, blood in stool, hematemesis Resp:  no shortness of breath at rest. No dyspnea on exertion, No excess mucus, no productive cough, No non-productive cough, No coughing up of blood.No change in color of mucus.No wheezing. Skin:  no rash or lesions. No jaundice GU:  no dysuria, change in color of urine, no urgency or frequency. No straining to urinate.  No flank pain.  Musculoskeletal:  No joint pain or no joint swelling. No decreased range of motion. No back pain.  Psych:  No change in mood or affect. No depression or anxiety. No  memory loss.  Neuro: no localizing neurological complaints, no tingling, no weakness, no double vision, no gait abnormality, no slurred speech, no confusion  All systems reviewed and apart from [place] all are negative  Past Medical History:   Past Medical History:  Diagnosis Date  . Depression   . Hypercholesteremia   . Hypertension       Past Surgical History:  Procedure Laterality Date  . ABDOMINAL HYSTERECTOMY    . BIOPSY  11/17/2018   Procedure: BIOPSY;  Surgeon: Yetta Flock, MD;  Location: [facility];  Service: Gastroenterology;;  . CHOLECYSTECTOMY    . ESOPHAGOGASTRODUODENOSCOPY (EGD) WITH PROPOFOL N/A 11/17/2018   Procedure: ESOPHAGOGASTRODUODENOSCOPY (EGD) WITH PROPOFOL;  Surgeon: Yetta Flock, MD;  Location: [facility];  Service: Gastroenterology;  Laterality: N/A;  . GASTRIC BYPASS    . thumb surgery      Social History:     reports that she has quit smoking. She has never used smokeless tobacco. She reports current alcohol use. She reports that she does not use drugs.     Family History:   Family History  Problem Relation Age of Onset  . Hypertension Other     Allergies: Allergies  Allergen Reactions  . Milk-Related Compounds Nausea Only and Other (See Comments)    Upsets the patient's stomach  . Demerol [Meperidine] Rash  . Penicillins Rash  Did it involve swelling of the face/tongue/throat, SOB, or low BP? Unknown Did it involve sudden or severe rash/hives, skin peeling, or any reaction on the inside of your mouth or nose? Unknown Did you need to seek medical attention at a hospital or doctor's office? Unknown When did it last happen? 15 years ago If all above answers are "NO", may proceed with cephalosporin use.   . Prozac [Fluoxetine Hcl] Palpitations  . Serzone [Nefazodone] Rash  . Sulfa Antibiotics Rash     Prior to Admission medications   Medication Sig Start Date End Date Taking? Authorizing Provider  acetaminophen  (TYLENOL) 500 MG tablet Take 1,000 mg by mouth every 8 (eight) hours as needed for mild pain.    [provider]  aspirin 325 MG tablet Take 1 tablet (325 mg total) by mouth daily. 12/26/18   Mikhail, Velta Addison, DO  atorvastatin (LIPITOR) 40 MG tablet Take 1 tablet (40 mg total) by mouth daily at 6 PM. 12/25/18   Cristal Ford, DO  Calcium Carbonate-Vit D-Min (CALCIUM 1200 PO) Take 1,200 mg by mouth daily.     [provider]  diltiazem (CARDIZEM) 60 MG tablet Take 1 tablet (60 mg total) by mouth every 6 (six) hours. Patient taking differently: Take 60 mg by mouth 2 (two) times daily.  12/25/18   Mikhail, Velta Addison, DO  guaiFENesin (MUCINEX) 600 MG 12 hr tablet Take 600 mg by mouth 2 (two) times daily.    [provider]  HYDROcodone-acetaminophen (NORCO/VICODIN) 5-325 MG tablet Take 1 tablet by mouth every 6 (six) hours as needed for moderate pain. Patient not taking: Reported on 05/16/2019 12/25/18   Cristal Ford, DO  hydroxypropyl methylcellulose / hypromellose (ISOPTO TEARS / GONIOVISC) 2.5 % ophthalmic solution Place 1 drop into both eyes 3 (three) times daily as needed for dry eyes.    [provider]  lidocaine (LIDODERM) 5 % Place 1 patch onto the skin daily. Remove & Discard patch within 12 hours or as directed by MD Patient not taking: Reported on 05/16/2019 12/03/18   Nat Christen, MD  Lidocaine 4 % PTCH Apply 1 patch topically daily. To the mid-back    [provider]  LORazepam (ATIVAN) 0.5 MG tablet Take 1 tablet (0.5 mg total) by mouth at bedtime as needed for anxiety. Patient not taking: Reported on 05/16/2019 12/25/18   Cristal Ford, DO  Magnesium 250 MG TABS Take 250 mg by mouth daily.     [provider]  Melatonin 3 MG TABS Take 3 mg by mouth at bedtime.    [provider]  metoprolol succinate (TOPROL-XL) 50 MG 24 hr tablet Take 50 mg by mouth daily. Take with or immediately following a meal.    [provider]   metoprolol tartrate (LOPRESSOR) 50 MG tablet Take 1 tablet (50 mg total) by mouth 2 (two) times daily. Patient not taking: Reported on 05/16/2019 12/25/18   Cristal Ford, DO  mirtazapine (REMERON) 15 MG tablet Take 1 tablet (15 mg total) by mouth at bedtime. Patient not taking: Reported on 05/16/2019 12/25/18   Cristal Ford, DO  omeprazole (PRILOSEC) 40 MG capsule crack open and ingest contents to maximize absorption of PPI given your gastric bypass state Patient taking differently: Take 40 mg by mouth See admin instructions. crack open and ingest contents to maximize absorption of PPI given your gastric bypass state 11/19/18   Shelly Coss, MD  polyethylene glycol powder (GLYCOLAX/MIRALAX) powder Take 17 g by mouth daily as needed for mild constipation.  [provider]  QUEtiapine (SEROQUEL) 25 MG tablet Take 1 tablet (25 mg total) by mouth at bedtime. Patient not taking: Reported on 05/16/2019 12/25/18   Cristal Ford, DO  senna-docusate (SENOKOT-S) 8.6-50 MG tablet Take 1 tablet by mouth at bedtime as needed for mild constipation. 12/25/18   Mikhail, Velta Addison, DO  sertraline (ZOLOFT) 100 MG tablet Take 100 mg by mouth daily.    [provider]  traZODone (DESYREL) 100 MG tablet Take 100 mg by mouth at bedtime.    [provider]  vitamin C (ASCORBIC ACID) 500 MG tablet Take 1,000 mg by mouth daily.     [provider]   Physical Exam: Blood pressure 136/71, pulse (!) 116, temperature 98.4 F (36.9 C), temperature source Oral, resp. rate 19, SpO2 91 %. 1. General:  in No Acute distress    Chronically ill  -appearing 2. Psychological: Alert and    Oriented 3. Head/ENT:     Dry Mucous Membranes                          Head Non traumatic, neck supple                            Poor Dentition 4. SKIN:  decreased Skin turgor,  Skin clean Dry and intact no rash 5. Heart: Regular rate and rhythm no  Murmur, no Rub or gallop 6. Lungs:  no wheezes or  crackles   7. Abdomen: Soft, non-tender, Non distended  bowel sounds present 8. Lower extremities: no clubbing, cyanosis, no  edema 9. Neurologically Grossly intact, moving all 4 extremities equally   10. MSK: Normal range of motion   All other LABS:     Recent Labs  Lab 08/04/19 1750  WBC 7.6  NEUTROABS 5.9  HGB 11.3*  HCT 35.0*  MCV 89.1  PLT 222     Recent Labs  Lab 08/04/19 1750  NA 139  K 3.1*  CL 102  CO2 22  GLUCOSE 89  BUN 19  CREATININE 0.81  CALCIUM 8.1*     Recent Labs  Lab 08/04/19 1750  AST 20  ALT 15  ALKPHOS 43  BILITOT 1.4*  PROT 5.0*  ALBUMIN 2.2*       Cultures:    Component Value Date/Time   SDES BLOOD LEFT ARM 11/15/2018 1640   SPECREQUEST  11/15/2018 1640    BOTTLES DRAWN AEROBIC ONLY Blood Culture results may not be optimal due to an inadequate volume of blood received in culture bottles   CULT  11/15/2018 1640    NO GROWTH 5 DAYS Performed at [facility], [place] [address] [place], [place] [address]    REPTSTATUS 11/20/2018 FINAL 11/15/2018 1640     Radiological Exams on Admission: Ct Abdomen Pelvis W Contrast  Result Date: 08/04/2019 CLINICAL DATA:  Nausea and vomiting for 2 weeks EXAM: CT ABDOMEN AND PELVIS WITH CONTRAST TECHNIQUE: Multidetector CT imaging of the abdomen and pelvis was performed using the standard protocol following bolus administration of intravenous contrast. CONTRAST:  12m OMNIPAQUE IOHEXOL 300 MG/ML  SOLN COMPARISON:  By report from 06/27/2013 FINDINGS: Lower chest: Small right pleural effusion is noted. No focal infiltrate is seen. Hepatobiliary: No focal liver abnormality is seen. Status post cholecystectomy. No biliary dilatation. Pancreas: The pancreas is atrophic.  No focal mass is noted. Spleen: Normal in size without focal abnormality. Adrenals/Urinary Tract: Adrenal glands are  within normal limits. Left kidney is well visualize with small hypodensities representing cysts. No obstructive changes  are noted. Right kidney demonstrates a dominant upper pole cystic lesion measuring 4.3 cm. It demonstrates some peripheral calcifications. This has increased in size from the prior CT from 2014 at which time it measured 2.9 cm. A medial simple cyst is noted within the right kidney as well. No obstructive changes are noted. No calculi are seen. The bladder is partially distended. Stomach/Bowel: The appendix is within normal limits. The colon is predominately decompressed without significant inflammatory change. No small bowel or gastric abnormality is noted. Changes of prior gastric bypass are seen. Vascular/Lymphatic: Aortic atherosclerosis. No enlarged abdominal or pelvic lymph nodes. Reproductive: Status post hysterectomy. No adnexal masses. Other: No abdominal wall hernia or abnormality. No abdominopelvic ascites. Musculoskeletal: Degenerative changes of lumbar spine are noted. Chronic L1 compression deformity is seen. IMPRESSION: Small right pleural effusion. Cystic changes within the kidneys bilaterally. A large complicated cyst is noted in the upper pole of the right kidney which is increased in size from the prior exam (4.3 cm increased from 2.9 cm). Chronic changes as described above. Electronically Signed   By: Inez Catalina M.D.   On: 08/04/2019 20:08    Chart has been reviewed    Assessment/Plan  73 y.o. female with medical history significant of previous gastric ulcer and GI bleed. Roux-en-Y gastric bypass in 2000, atrial fibrillation not on anticoagulation secondary to bleeding.  History of CVA with oropharyngeal dysphagia, history of chronic anemia and GI bleeding and  Essential hypertension Admitted for UTI, dehydration  Present on Admission: . Acute metabolic encephalopathy -   most likely multifactorial secondary to combination of   Infection  mild dehydration secondary to decreased by mouth intake,    - Will rehydrate   - treat underlining infection   - Hold contributing medications    - if no improvement may need further imaging to evaluate for CNS pathology pathology such as MRI of the brain   - neurological exam appears to be nonfocal but patient unable to cooperate fully     - no history of liver disease ammonia unremarkable  . Sepsis ([facility]) -  -SIRS criteria met with  tachycardia,  confusion   With evidence of end organ damage such as  encephalopathy -Most likely source being  Urinary   - Obtain serial lactic acid and procalcitonin level.  - Initiate IV antibiotics   - await results of blood and urine culture  - Rehydrate    Nausea likely due to UTI ct abd non acute, supportive management  . Anemia -chronic stable  . Depression chronic stable resume home medications   . Hypertension resume home medications if able to tolerate p.o. otherwise may need to switch to IV  . Acute lower UTI -  - treat with Rocephin        await results of urine culture and adjust antibiotic coverage as needed  . Hypokalemia -  will replace and repeat in AM,  check magnesium level and replace as needed  . Hypomagnesemia -replace  . Dehydration -rehydrate and follow  . Chronic a-fib (HCC)-           - CHA2DS2 vas score 5  Not on anticoagulation secondary to  recurrent bleeding         -  Rate control:  Currently controlled with  Toprolol  Diltiazem,     History of CVA continue aspirin  History of dysphagia patient states that she  has been having trouble swallowing will do speech pathology evaluation  Other plan as per orders.  DVT prophylaxis:  SCD   Code Status:    DNR/DNI as per prior records pt confirms    Family Communication:   Family not at  Bedside   Disposition Plan:                              Back to current facility when stable                                             Would benefit from PT/OT eval prior to DC  Ordered                   Swallow eval - SLP ordered                   Social Work  consulted                   Nutrition    consulted                                        Consults called: none    Admission status:  ED Disposition    ED Disposition Condition Comment   Admit  The patient appears reasonably stabilized for admission considering the current resources, flow, and capabilities available in the ED at this time, and I doubt any other [facility] requiring further screening and/or treatment in the ED prior to admission is  present.       Obs      Level of care    tele  For 12H    Precautions:   Airborne and Contact precautions  PPE: Used by the provider:   P100  eye Goggles,  Gloves       Raymie Giammarco 08/05/2019, 1:09 AM    [facility]     after 2 AM please page floor coverage PA If 7AM-7PM, please contact the day team taking care of the patient using [email or web address]

## 2019-08-05 NOTE — Progress Notes (Addendum)
Subjective  This is a 73 year old female who was admitted early this morning with past medical history of previous gastric ulcer and GI bleed, status post Roux-en-Y gastric bypass in 2000, atrial fibrillation not on anticoagulation secondary to bleeding, history of CVA with oropharyngeal dysphagia, chronic anemia and essential hypertension who presented to the ED on 10/28 with decreased p.o. intake x2 weeks, 3 weeks of nausea and vomiting.  On presentation patient was unable to give a detailed history secondary to confusion.  She was reporting a dry cough, nausea and vomiting and patient is from a facility so she had a rapid Covid test which was negative.  In the ED the patient had a CT abdomen/pelvis which was without acute pathology, unremarkable lactic acid, normal ammonia.  She was found to have evidence of UTI and started on ceftriaxone and IV fluids.  She was admitted for acute metabolic encephalopathy and sepsis secondary to UTI.  Currently, patient was evaluated lying flat at bedside in no acute distress and resting comfortably.  She admits to the above complaints which caused her to present to the ED.  Currently states that she has no appetite and does not feel like she can tolerate p.o. intake.  Denies any dysuria or frequency.  Admits to lack of appetite.  Patient states that she has chronic left upper extremity swelling since her stroke in the past.  Otherwise, she denies any other complaints.  Physical Exam Constitutional:      General: She is not in acute distress.    Appearance: She is obese. She is not toxic-appearing.     Comments: AOx3 Does not make eye contact  HENT:     Head: Normocephalic and atraumatic.     Mouth/Throat:     Mouth: Mucous membranes are dry.  Eyes:     Extraocular Movements: Extraocular movements intact.  Cardiovascular:     Rate and Rhythm: Normal rate. Rhythm irregular.     Heart sounds: No murmur.  Pulmonary:     Effort: Pulmonary effort is normal.    Breath sounds: Normal breath sounds.  Abdominal:     General: Abdomen is flat.     Comments: Right lower quadrant tenderness to palpation  Musculoskeletal:     Comments: Left upper extremity edema  Skin:    General: Skin is warm and dry.  Neurological:     Mental Status: She is alert. Mental status is at baseline.  Psychiatric:        Thought Content: Thought content normal.    Assessment/plan  Sepsis secondary to UTI sepsis resolved  Continue IV fluids  Continue IV antibiotics  Follow-up cultures  Dehydration likely secondary to decreased p.o. intake and vomiting, suspect viral gastritis.  Continue IV fluids  IV Protonix  Acute metabolic encephalopathy currently AAO x3.  Unknown baseline  Plan as above  Large complicated right renal cyst noted to have some calcifications, 2.9 cm-> 4.3 cm change from previous.  May be because of right-sided abdominal pain  Consider further work-up  Tylenol  Left upper extremity edema - ultrasound Anemia -stable Depression -stable continue home meds Hypertension -stable on p.o. meds Hypokalemia -replete and follow-up in a.m. Hypomagnesemia -replete and follow-up in a.m. Chronic atrial fibrillation not on anticoagulation due to history of GI bleed -continue Cardizem and Toprol History of CVA -continue aspirin History of dysphagia -follow-up speech eval   Marva Panda, DO [facility]  Pager [phone]

## 2019-08-05 NOTE — Social Work (Signed)
CSW acknowledging consult for SNF placement.  Pt from [facility].  Will follow for therapy recommendations needed to best determine disposition/for insurance authorization. Pt must have PT/OT for [facility] authorization.   Westley Hummer, MSW, [employer] Work [phone]

## 2019-08-06 ENCOUNTER — Inpatient Hospital Stay (HOSPITAL_COMMUNITY): Payer: Medicare HMO

## 2019-08-06 DIAGNOSIS — R6520 Severe sepsis without septic shock: Secondary | ICD-10-CM

## 2019-08-06 DIAGNOSIS — M7989 Other specified soft tissue disorders: Secondary | ICD-10-CM | POA: Diagnosis not present

## 2019-08-06 DIAGNOSIS — A4151 Sepsis due to Escherichia coli [E. coli]: Principal | ICD-10-CM

## 2019-08-06 DIAGNOSIS — E440 Moderate protein-calorie malnutrition: Secondary | ICD-10-CM | POA: Insufficient documentation

## 2019-08-06 LAB — BASIC METABOLIC PANEL
Anion gap: 17 — ABNORMAL HIGH (ref 5–15)
BUN: 16 mg/dL (ref 8–23)
CO2: 18 mmol/L — ABNORMAL LOW (ref 22–32)
Calcium: 8.2 mg/dL — ABNORMAL LOW (ref 8.9–10.3)
Chloride: 103 mmol/L (ref 98–111)
Creatinine, Ser: 0.68 mg/dL (ref 0.44–1.00)
GFR calc Af Amer: >60 mL/min (ref >60)
GFR calc non Af Amer: >60 mL/min (ref >60)
Glucose, Bld: 64 mg/dL — ABNORMAL LOW (ref 70–99)
Potassium: 3.1 mmol/L — ABNORMAL LOW (ref 3.5–5.1)
Sodium: 138 mmol/L (ref 135–145)

## 2019-08-06 LAB — MAGNESIUM: Magnesium: 1.8 mg/dL (ref 1.7–2.4)

## 2019-08-06 LAB — CBC
HCT: 33.1 % — ABNORMAL LOW (ref 36.0–46.0)
Hemoglobin: 11 g/dL — ABNORMAL LOW (ref 12.0–15.0)
MCH: 28.6 pg (ref 26.0–34.0)
MCHC: 33.2 g/dL (ref 30.0–36.0)
MCV: 86.2 fL (ref 80.0–100.0)
Platelets: 211 10*3/uL (ref 150–400)
RBC: 3.84 MIL/uL — ABNORMAL LOW (ref 3.87–5.11)
RDW: 14.7 % (ref 11.5–15.5)
WBC: 9.9 10*3/uL (ref 4.0–10.5)
nRBC: 0 % (ref 0.0–0.2)

## 2019-08-06 LAB — VITAMIN D 25 HYDROXY (VIT D DEFICIENCY, FRACTURES): Vit D, 25-Hydroxy: 49.97 ng/mL (ref 30–100)

## 2019-08-06 MED ORDER — ENSURE ENLIVE PO LIQD
237.0000 mL | Freq: Three times a day (TID) | ORAL | Status: DC
  Administered 2019-08-07 (×3): 237 mL via ORAL

## 2019-08-06 MED ORDER — ADULT MULTIVITAMIN W/MINERALS CH
1.0000 | ORAL_TABLET | Freq: Every day | ORAL | Status: DC
  Administered 2019-08-06 – 2019-08-08 (×3): 1 via ORAL
  Filled 2019-08-06 (×3): qty 1

## 2019-08-06 MED ORDER — PANTOPRAZOLE SODIUM 40 MG PO TBEC
40.0000 mg | DELAYED_RELEASE_TABLET | Freq: Every day | ORAL | Status: DC
  Administered 2019-08-06 – 2019-08-07 (×2): 40 mg via ORAL
  Filled 2019-08-06 (×2): qty 1

## 2019-08-06 MED ORDER — PRO-STAT SUGAR FREE PO LIQD
30.0000 mL | Freq: Three times a day (TID) | ORAL | Status: DC
  Administered 2019-08-06 – 2019-08-08 (×4): 30 mL via ORAL
  Filled 2019-08-06 (×6): qty 30

## 2019-08-06 MED ORDER — MAGNESIUM SULFATE 2 GM/50ML IV SOLN
2.0000 g | Freq: Once | INTRAVENOUS | Status: AC
  Administered 2019-08-06: 2 g via INTRAVENOUS
  Filled 2019-08-06: qty 50

## 2019-08-06 MED ORDER — POTASSIUM CHLORIDE 10 MEQ/100ML IV SOLN
10.0000 meq | INTRAVENOUS | Status: AC
  Administered 2019-08-06 (×4): 10 meq via INTRAVENOUS
  Filled 2019-08-06 (×3): qty 100

## 2019-08-06 MED ORDER — VITAMIN B-1 100 MG PO TABS
100.0000 mg | ORAL_TABLET | Freq: Every day | ORAL | Status: DC
  Administered 2019-08-06 – 2019-08-08 (×3): 100 mg via ORAL
  Filled 2019-08-06 (×3): qty 1

## 2019-08-06 NOTE — Evaluation (Signed)
Occupational Therapy Evaluation Patient Details [name] MRN: [id number] DOB: March 15, 1946 Today's Date: 08/06/2019    History of Present Illness Pt is a 73 y/o female admitted from SNF secondary to nausea. Pt found to have sepsis with acute metabolic encephalopathy and dehydration. PMH including but not limited to Roux-en-Y gastric bypass in 2000, atrial fibrillation not on anticoagulation secondary to bleeding, CVA with oropharyngeal dysphagia and HTN.   Clinical Impression   Pt admitted with the above diagnoses and presents with below problem list. Pt will benefit from continued acute OT to address the below listed deficits and maximize independence with basic ADLs prior to d/c back to SNF. Per pt and sister's report pt was total assist with bathing/dressing, +2 for bed mobility, lift equipment utilized for OOB. Pt was however able to feed self with setup provided PTA. Pt is currently total A with all basic ADLs, including feeding. LUE noted to be edematous.      Follow Up Recommendations  SNF    Equipment Recommendations  None recommended by OT    Recommendations for Other Services       Precautions / Restrictions Precautions Precautions: Fall Precaution Comments: prior CVA with L sided hemiparesis Restrictions Weight Bearing Restrictions: No      Mobility Bed Mobility Overal bed mobility: Needs Assistance Bed Mobility: Rolling Rolling: Total assist;+2 for physical assistance         General bed mobility comments: total A x2 with use of bed pads to roll bilaterally and for repositioning in bed  Transfers                 General transfer comment: would require a mechanical lift    Balance                                           ADL either performed or assessed with clinical judgement   ADL Overall ADL's : Needs assistance/impaired Eating/Feeding: Bed level;Total assistance   Grooming: Total assistance;Bed level    Upper Body Bathing: Total assistance;Bed level   Lower Body Bathing: Total assistance;+2 for physical assistance;Bed level   Upper Body Dressing : Total assistance;Bed level   Lower Body Dressing: Total assistance;+2 for physical assistance;Bed level                 General ADL Comments: Pt total A for ADLs.      Vision         Perception     Praxis      Pertinent Vitals/Pain Pain Assessment: No/denies pain     Hand Dominance Left   Extremity/Trunk Assessment Upper Extremity Assessment Upper Extremity Assessment: LUE deficits/detail;Generalized weakness LUE Deficits / Details: flaccid LUE with significant edema noted throughout LUE. Pt denies pain in LUE.   Lower Extremity Assessment Lower Extremity Assessment: Defer to PT evaluation LLE Deficits / Details: pt with no active movement of L LE observed throughout evaluation; pt with normal sensation to light touch       Communication Communication Communication: Other (comment)(hx of dysphagia from prior CVA)   Cognition Arousal/Alertness: Awake/alert Behavior During Therapy: Flat affect Overall Cognitive Status: Impaired/Different from baseline Area of Impairment: Memory                     Memory: Decreased short-term memory         General Comments: some delayed responses  General Comments       Exercises Exercises: Other exercises Other Exercises Other Exercises: PROM to L LE for hip flexion, hip abduction, hip adduction, knee flexion, knee extension, ankle DF/PF   Shoulder Instructions      Home Living Family/patient expects to be discharged to:: Skilled nursing facility Living Arrangements: Other (Comment)(SNF)                               Additional Comments: from [facility]      Prior Functioning/Environment Level of Independence: Needs assistance  Gait / Transfers Assistance Needed: total A, required a lift for transfers ADL's / Homemaking Assistance  Needed: total A from staff for bathing, dressing, toileting. was able to feed self at [facility] per pt and pt's sister's report..            OT Problem List: Decreased activity tolerance;Decreased knowledge of use of DME or AE;Decreased knowledge of precautions;Decreased strength;Impaired UE functional use;Increased edema;Obesity;Impaired tone;Decreased coordination;Impaired balance (sitting and/or standing)      OT Treatment/Interventions: Self-care/ADL training;DME and/or AE instruction;Therapeutic activities;Patient/family education    OT Goals(Current goals can be found in the care plan section) Acute Rehab OT Goals Patient Stated Goal: to get stronger OT Goal Formulation: With patient/family Time For Goal Achievement: 08/20/19 Potential to Achieve Goals: Fair ADL Goals Pt Will Perform Eating: bed level;with min assist;with set-up Additional ADL Goal #1: Pt and caregivers will be independent with education on positioning for pressure relief and L side hemiplegia.  OT Frequency: Min 2X/week   Barriers to D/C:            Co-evaluation PT/OT/SLP Co-Evaluation/Treatment: Yes Reason for Co-Treatment: For patient/therapist safety;To address functional/ADL transfers PT goals addressed during session: Mobility/safety with mobility;Balance;Proper use of DME;Strengthening/ROM OT goals addressed during session: ADL's and self-care      AM-PAC OT "6 Clicks" Daily Activity     Outcome Measure Help from another person eating meals?: Total Help from another person taking care of personal grooming?: Total Help from another person toileting, which includes using toliet, bedpan, or urinal?: Total Help from another person bathing (including washing, rinsing, drying)?: Total Help from another person to put on and taking off regular upper body clothing?: Total Help from another person to put on and taking off regular lower body clothing?: Total 6 Click Score: 6   End of Session    Activity  Tolerance:   Patient left: in bed;with call bell/phone within reach;with bed alarm set;with family/visitor present  OT Visit Diagnosis: Muscle weakness (generalized) (M62.81);Feeding difficulties (R63.3);Cognitive communication deficit (R41.841);Hemiplegia and hemiparesis;Other abnormalities of gait and mobility (R26.89)                Time: 6712-4580 OT Time Calculation (min): 23 min Charges:  OT General Charges $OT Visit: 1 Visit OT Evaluation $OT Eval Moderate Complexity: 1 Mod  Raynald Kemp, OT Acute Rehabilitation Services Pager: [phone] Office: [phone]   Pilar Grammes 08/06/2019, 12:59 PM

## 2019-08-06 NOTE — Evaluation (Addendum)
Clinical/Bedside Swallow Evaluation Patient Details  [name] MRN: [id number] Date of Birth: [birth year]  Today's Date: 08/06/2019 Time: SLP Start Time (ACUTE ONLY): 7096 SLP Stop Time (ACUTE ONLY): 0905 SLP Time Calculation (min) (ACUTE ONLY): 10 min  Past Medical History:  Past Medical History:  Diagnosis Date  . Chronic atrial fibrillation ([place])   . Depression   . Hypercholesteremia   . Hypertension   . Stroke [facility])    Past Surgical History:  Past Surgical History:  Procedure Laterality Date  . ABDOMINAL HYSTERECTOMY    . BIOPSY  11/17/2018   Procedure: BIOPSY;  Surgeon: Yetta Flock, MD;  Location: [place];  Service: Gastroenterology;;  . CHOLECYSTECTOMY    . ESOPHAGOGASTRODUODENOSCOPY (EGD) WITH PROPOFOL N/A 11/17/2018   Procedure: ESOPHAGOGASTRODUODENOSCOPY (EGD) WITH PROPOFOL;  Surgeon: Yetta Flock, MD;  Location: [place];  Service: Gastroenterology;  Laterality: N/A;  . GASTRIC BYPASS    . thumb surgery     HPI:  Adrienne Stone is a 73 y.o. female with medical history significant of previous gastric ulcer and GI bleed, gastric bypass in 2000, CVA with oropharyngeal dysphagia, and GI bleeding. Presented with 3 wks of nausea and Vomiting.  She was found to have evidence of UTI and admitted for acute metabolic encephalopathy and sepsis secondary to UTI.   Assessment / Plan / Recommendation Clinical Impression   Assessment today was limited by pt's nausea and minimal PO intake; however, pt exhibited no overt s/s of aspiration with straw sips of thin liquids.  Pt politely declined all solids.  Pt does have a history of silent aspiration with large volume boluses following CVA per MBS report from 12/2018, but today her voice remained clear immediately prior to and following straw sips, her respirations remained unchanged with PO intake, and swallow response appeared swift and timely at bedside.  Pt also reports that she had advanced to  eating and drinking a regular diet at her skilled nursing facility.  Her swallowing deficits appear to most directly correlate to her prev CVA but may be slightly exacerbated in light of her acute deconditioning.  As a result, I would suggest that pt remain on a dys 3, thin liquids diet.  For now, please avoid using straws and encourage small careful sips with thin liquids.  Meds may be administered whole with water or per pt's preference.  SLP will follow up for 1-2 additional sessions to ensure toleration of diet and make adjustments as needed given that pt was not observed with solids today.    Of note, orders were also received for instrumental swallow study/MBS.  At this time I do not feel that an instrumental assessment of pt's swallowing function is warranted but if it does become necessary to more objectively evaluate pt's swallow the speech therapy team will reach out for new orders.  Thank you.  SLP Visit Diagnosis: Dysphagia, oropharyngeal phase (R13.12)    Aspiration Risk  Mild aspiration risk    Diet Recommendation Dysphagia 3 (Mech soft);Thin liquid   Liquid Administration via: Cup;No straw Medication Administration: Whole meds with liquid Supervision: Full supervision/cueing for compensatory strategies Compensations: Minimize environmental distractions;Slow rate;Small sips/bites Postural Changes: Seated upright at 90 degrees;Remain upright for at least 30 minutes after po intake    Other  Recommendations Oral Care Recommendations: Oral care BID   Follow up Recommendations Skilled Nursing facility      Frequency and Duration min 2x/week          Prognosis Prognosis  for Safe Diet Advancement: Good Barriers to Reach Goals: Cognitive deficits      Swallow Study   General HPI: Adrienne Stone is a 73 y.o. female with medical history significant of previous gastric ulcer and GI bleed, gastric bypass in 2000, CVA with oropharyngeal dysphagia, and GI bleeding. Presented  with 3 wks of nausea and Vomiting.  She was found to have evidence of UTI and admitted for acute metabolic encephalopathy and sepsis secondary to UTI. Type of Study: Bedside Swallow Evaluation Previous Swallow Assessment: 12/2018 Diet Prior to this Study: Dysphagia 3 (soft);Thin liquids Temperature Spikes Noted: No Respiratory Status: Room air History of Recent Intubation: No Behavior/Cognition: Lethargic/Drowsy;Cooperative;Pleasant mood Oral Cavity Assessment: Within Functional Limits Oral Cavity - Dentition: Adequate natural dentition Vision: Functional for self-feeding Self-Feeding Abilities: Needs assist Patient Positioning: Upright in bed Baseline Vocal Quality: Low vocal intensity Volitional Cough: Weak Volitional Swallow: Able to elicit    Oral/Motor/Sensory Function Overall Oral Motor/Sensory Function: Moderate impairment Facial ROM: Reduced left Facial Symmetry: Abnormal symmetry left Facial Strength: Reduced left Lingual ROM: Reduced left Lingual Symmetry: Abnormal symmetry left Lingual Strength: Reduced   Ice Chips     Thin Liquid Thin Liquid: Within functional limits Presentation: Straw    Nectar Thick     Honey Thick     Puree     Solid            Adrienne Stone, Adrienne Stone 08/06/2019,9:13 AM

## 2019-08-06 NOTE — Progress Notes (Signed)
Upper extremity venous has been completed.   Preliminary results in CV Proc.   Abram Sander 08/06/2019 10:13 AM

## 2019-08-06 NOTE — Evaluation (Signed)
Physical Therapy Evaluation Patient Details [name] MRN: [id number] DOB: November 20, 1945 Today's Date: 08/06/2019   History of Present Illness  Pt is a 73 y/o female admitted from SNF secondary to nausea. Pt found to have sepsis with acute metabolic encephalopathy and dehydration. PMH including but not limited to Roux-en-Y gastric bypass in 2000, atrial fibrillation not on anticoagulation secondary to bleeding, CVA with oropharyngeal dysphagia and HTN.    Clinical Impression  Pt presented supine in bed with HOB elevated, awake and willing to participate in therapy session. Prior to admission, pt and pt's sister report pt was at [facility] where she required total A for all mobility and ADLs. Pt currently requiring total A x2 for bed mobility. She presents with continued L sided weakness (no active movement during evaluation), generalized weakness and debility. Pt would continue to benefit from skilled physical therapy services at this time while admitted and after d/c to address the below listed limitations in order to improve overall safety and independence with functional mobility.     Follow Up Recommendations SNF    Equipment Recommendations  None recommended by PT    Recommendations for Other Services       Precautions / Restrictions Precautions Precautions: Fall Precaution Comments: prior CVA with L sided hemiparesis Restrictions Weight Bearing Restrictions: No      Mobility  Bed Mobility Overal bed mobility: Needs Assistance Bed Mobility: Rolling Rolling: Total assist;+2 for physical assistance         General bed mobility comments: total A x2 with use of bed pads to roll bilaterally and for repositioning in bed  Transfers                 General transfer comment: would require a mechanical lift  Ambulation/Gait                Stairs            Wheelchair Mobility    Modified Rankin (Stroke Patients Only)       Balance                                              Pertinent Vitals/Pain Pain Assessment: No/denies pain    Home Living Family/patient expects to be discharged to:: Skilled nursing facility                 Additional Comments: from [facility]    Prior Function Level of Independence: Needs assistance   Gait / Transfers Assistance Needed: total A, required a lift for transfers  ADL's / Homemaking Assistance Needed: total A from staff        Hand Dominance        Extremity/Trunk Assessment   Upper Extremity Assessment Upper Extremity Assessment: Defer to OT evaluation    Lower Extremity Assessment Lower Extremity Assessment: LLE deficits/detail LLE Deficits / Details: pt with no active movement of L LE observed throughout evaluation; pt with normal sensation to light touch       Communication   Communication: No difficulties;Other (comment)(hx of dysphagia from prior CVA)  Cognition Arousal/Alertness: Awake/alert Behavior During Therapy: Flat affect Overall Cognitive Status: Impaired/Different from baseline Area of Impairment: Memory                     Memory: Decreased short-term memory  General Comments      Exercises Other Exercises Other Exercises: PROM to L LE for hip flexion, hip abduction, hip adduction, knee flexion, knee extension, ankle DF/PF   Assessment/Plan    PT Assessment Patient needs continued PT services  PT Problem List Decreased strength;Decreased range of motion;Decreased activity tolerance;Decreased balance;Decreased mobility;Decreased coordination;Decreased cognition;Decreased knowledge of use of DME;Decreased safety awareness;Decreased knowledge of precautions       PT Treatment Interventions Functional mobility training;Therapeutic activities;Therapeutic exercise;Balance training;Neuromuscular re-education;Cognitive remediation;DME instruction    PT Goals (Current goals can be found in  the Care Plan section)  Acute Rehab PT Goals Patient Stated Goal: to get stronger PT Goal Formulation: With patient/family Time For Goal Achievement: 08/20/19 Potential to Achieve Goals: Fair    Frequency Min 2X/week   Barriers to discharge        Co-evaluation PT/OT/SLP Co-Evaluation/Treatment: Yes Reason for Co-Treatment: For patient/therapist safety;To address functional/ADL transfers PT goals addressed during session: Mobility/safety with mobility;Balance;Proper use of DME;Strengthening/ROM         AM-PAC PT "6 Clicks" Mobility  Outcome Measure Help needed turning from your back to your side while in a flat bed without using bedrails?: Total Help needed moving from lying on your back to sitting on the side of a flat bed without using bedrails?: Total Help needed moving to and from a bed to a chair (including a wheelchair)?: Total Help needed standing up from a chair using your arms (e.g., wheelchair or bedside chair)?: Total Help needed to walk in hospital room?: Total Help needed climbing 3-5 steps with a railing? : Total 6 Click Score: 6    End of Session   Activity Tolerance: Patient tolerated treatment well Patient left: in bed;with call bell/phone within reach;with bed alarm set;with family/visitor present Nurse Communication: Mobility status;Other (comment)(pt positioned in R sidelying) PT Visit Diagnosis: Other abnormalities of gait and mobility (R26.89)    Time: 9147-8295 PT Time Calculation (min) (ACUTE ONLY): 24 min   Charges:   PT Evaluation $PT Eval Moderate Complexity: 1 Mod          Eduard Clos, PT, DPT  Acute Rehabilitation Services Pager [phone] Office [place] 08/06/2019, 12:29 PM

## 2019-08-06 NOTE — Progress Notes (Addendum)
PROGRESS NOTE    Adrienne Stone   ZOX:[id number] DOB: [birth year] DOA: 08/04/2019  Admitted from: [facility] PCP: System, Pcp Not In   Hospital Summary  This is a 73 year old female who was admitted with past medical history of previous gastric ulcer and GI bleed, status post Roux-en-Y gastric bypass in 2000, atrial fibrillation not on anticoagulation secondary to bleeding, history of CVA with oropharyngeal dysphagia, chronic anemia and essential hypertension who presented to the ED on 10/28 with decreased p.o. intake x2 weeks, 3 weeks of nausea and vomiting. rapid Covid test was negative. In the ED the patient had a CT abdomen/pelvis which was without acute pathology, unremarkable lactic acid, normal ammonia.  She was found to have evidence of UTI and started on ceftriaxone and IV fluids.  She was admitted for acute metabolic encephalopathy and sepsis secondary to UTI.  Patient underwent left upper extremity Doppler for LUE swelling which was negative for DVT.  Underwent bedside swallow eval by speech pathology and recommended dysphagia 3 diet.  Evaluated by PT/OT recommended for SNF placement.  A & P   Principal Problem:   Sepsis (HCC) Active Problems:   Anemia   History of GI bleed   Depression   Hypertension   Dysphagia   Acute lower UTI   Hypokalemia   Hypomagnesemia   Dehydration   Chronic a-fib (HCC)   Acute metabolic encephalopathy   Encephalopathy acute  Sepsis secondary to E. coli UTI sepsis on admission resolved.  Asymptomatic.  Afebrile with resolved leukocytosis  Hold IV fluids now that patient is advancing her diet   continue IV antibiotics, currently day 2/3 ceftriaxone  Dehydration  improved with IV fluids.  Likely secondary to decreased p.o. intake and vomiting, suspect viral gastritis.  Now starting to tolerate p.o. intake  Hold IV fluids  IV Protonix-> p.o. Protonix  Advance diet as tolerated  Acute metabolic encephalopathy secondary to sepsis  and dehydration  improved from yesterday.  Currently AAO x3  seems at/near baseline  Plan as above  Large complicated right renal cyst noted to have some calcifications, 2.9 cm-> 4.3 cm change from previous.  May be the cause of right-sided abdominal pain on admission -which has since resolved  Consider further outpatient work-up  Tylenol  Left upper extremity edema -LUE Doppler final read pending, preliminary read without DVT  Anemia -stable Depression -stable continue home meds Hypertension -stable on p.o. meds, Cardizem and metoprolol Hypokalemia -replete and follow-up in a.m. Hypomagnesemia -improved.  Follow-up in a.m. Chronic atrial fibrillation not on anticoagulation due to history of GI bleed -continue Cardizem and Toprol History of CVA -continue full dose aspirin History of dysphagia -follow-up speech eval  DVT prophylaxis: SCDs   Code Status: DNR  Diet: Dysphagia 3 Family Communication: No family at bedside Disposition Plan: Pending clinical improvement, likely medically stable for discharge tomorrow.  Patient will need SNF placement at discharge  Consultants   None  Antibiotics  Day 2/3 of ceftriaxone for E. coli UTI    Subjective   Patient is a poor historian  Patient seen and examined lying flat resting comfortably at bedside.  Currently admits that she is feeling better than she has been over the past couple weeks and improved since admission.  States she was able to drink orange juice this morning without any adverse effects.  Denies any abdominal pain, nausea or vomiting.  Denies any chest pain. Objective   Vitals:   08/05/19 2157 08/06/19 0250 08/06/19 0545 08/06/19 0954  BP: 116/79  125/80 136/70 (!) 143/86  Pulse: 81 85 93 77  Resp: 15 18 16 16   Temp: 97.7 F (36.5 C) 98.5 F (36.9 C) 97.9 F (36.6 C) 98.5 F (36.9 C)  TempSrc: Oral Oral  Axillary  SpO2: 98% 97% 97% 97%    Intake/Output Summary (Last 24 hours) at 08/06/2019 1352 Last data  filed at 08/06/2019 0919 Gross per 24 hour  Intake 1290.56 ml  Output 300 ml  Net 990.56 ml   There were no vitals filed for this visit.  Examination:  Physical Exam Vitals signs and nursing note reviewed.  Constitutional:      General: She is not in acute distress.    Appearance: She is obese.     Comments: Slow but appropriate speech  HENT:     Head: Normocephalic and atraumatic.     Mouth/Throat:     Mouth: Mucous membranes are moist.  Cardiovascular:     Rate and Rhythm: Normal rate and regular rhythm.  Pulmonary:     Effort: Pulmonary effort is normal.     Breath sounds: Normal breath sounds.  Abdominal:     General: Abdomen is flat. Bowel sounds are normal. There is no distension.     Palpations: Abdomen is soft.     Tenderness: There is no abdominal tenderness.  Musculoskeletal:     Comments: Left upper extremity edema similar to yesterday without pain redness  Range of motion at baseline status post prior stroke  Skin:    General: Skin is warm.     Coloration: Skin is not jaundiced.  Neurological:     Mental Status: She is alert and oriented to person, place, and time. Mental status is at baseline.  Psychiatric:        Mood and Affect: Mood normal.        Behavior: Behavior normal.     Data Reviewed: I have personally reviewed following labs and imaging studies  CBC: Recent Labs  Lab 08/04/19 1750 08/05/19 0950 08/06/19 0712  WBC 7.6 11.4* 9.9  NEUTROABS 5.9  --   --   HGB 11.3* 11.0* 11.0*  HCT 35.0* 34.8* 33.1*  MCV 89.1 90.6 86.2  PLT 222 223 211   Basic Metabolic Panel: Recent Labs  Lab 08/04/19 1750 08/05/19 0950 08/06/19 0712 08/06/19 0730  NA 139 135 138  --   K 3.1* 3.2* 3.1*  --   CL 102 107 103  --   CO2 22 22 18*  --   GLUCOSE 89 77 64*  --   BUN 19 18 16   --   CREATININE 0.81 0.63 0.68  --   CALCIUM 8.1* 7.5* 8.2*  --   MG  --  1.5*  --  1.8  PHOS  --  2.8  --   --    GFR: CrCl cannot be calculated (Unknown ideal  weight.). Liver Function Tests: Recent Labs  Lab 08/04/19 1750 08/05/19 0950  AST 20 17  ALT 15 15  ALKPHOS 43 41  BILITOT 1.4* 1.0  PROT 5.0* 4.6*  ALBUMIN 2.2* 2.1*   Recent Labs  Lab 08/04/19 1750  LIPASE 25   Recent Labs  Lab 08/04/19 1750  AMMONIA 33   Coagulation Profile: No results for input(s): INR, PROTIME in the last 168 hours. Cardiac Enzymes: No results for input(s): CKTOTAL, CKMB, CKMBINDEX, TROPONINI in the last 168 hours. BNP (last 3 results) No results for input(s): PROBNP in the last 8760 hours. HbA1C: No results for input(s): HGBA1C in  the last 72 hours. CBG: Recent Labs  Lab 08/04/19 1757 08/05/19 0435  GLUCAP 93 78   Lipid Profile: No results for input(s): CHOL, HDL, LDLCALC, TRIG, CHOLHDL, LDLDIRECT in the last 72 hours. Thyroid Function Tests: Recent Labs    08/05/19 0950  TSH 1.956   Anemia Panel: No results for input(s): VITAMINB12, FOLATE, FERRITIN, TIBC, IRON, RETICCTPCT in the last 72 hours. Sepsis Labs: Recent Labs  Lab 08/04/19 1750  LATICACIDVEN 1.1    Recent Results (from the past 240 hour(s))  SARS CORONAVIRUS 2 (TAT 6-24 HRS) Nasopharyngeal Nasopharyngeal Swab     Status: None   Collection Time: 08/04/19  8:09 PM   Specimen: Nasopharyngeal Swab  Result Value Ref Range Status   SARS Coronavirus 2 NEGATIVE NEGATIVE Final    Comment: (NOTE) SARS-CoV-2 target nucleic acids are NOT DETECTED. The SARS-CoV-2 RNA is generally detectable in upper and lower respiratory specimens during the acute phase of infection. Negative results do not preclude SARS-CoV-2 infection, do not rule out co-infections with other pathogens, and should not be used as the sole basis for treatment or other patient management decisions. Negative results must be combined with clinical observations, patient history, and epidemiological information. The expected result is Negative. Fact Sheet for  Patients: HairSlick.no Fact Sheet for Healthcare Providers: quierodirigir.com This test is not yet approved or cleared by the [place] FDA and  has been authorized for detection and/or diagnosis of SARS-CoV-2 by FDA under an Emergency Use Authorization (EUA). This EUA will remain  in effect (meaning this test can be used) for the duration of the COVID-19 declaration under Section 56 4(b)(1) of the Act, 21 U.S.C. section 360bbb-3(b)(1), unless the authorization is terminated or revoked sooner. Performed at [facility], [address] [place], [place] [address]   Urine Culture     Status: Abnormal (Preliminary result)   Collection Time: 08/05/19 12:23 AM   Specimen: Urine, Clean Catch  Result Value Ref Range Status   Specimen Description URINE, CLEAN CATCH  Final   Special Requests NONE  Final   Culture (A)  Final    >=100,000 COLONIES/mL ESCHERICHIA COLI SUSCEPTIBILITIES TO FOLLOW CULTURE REINCUBATED FOR BETTER GROWTH Performed at [facility], [address] [place], [place] [address]    Report Status PENDING  Incomplete         Radiology Studies: Ct Abdomen Pelvis W Contrast  Result Date: 08/04/2019 CLINICAL DATA:  Nausea and vomiting for 2 weeks EXAM: CT ABDOMEN AND PELVIS WITH CONTRAST TECHNIQUE: Multidetector CT imaging of the abdomen and pelvis was performed using the standard protocol following bolus administration of intravenous contrast. CONTRAST:  OMNIPAQUE IOHEXOL 300 MG/ML  SOLN COMPARISON:  By report from 06/27/2013 FINDINGS: Lower chest: Small right pleural effusion is noted. No focal infiltrate is seen. Hepatobiliary: No focal liver abnormality is seen. Status post cholecystectomy. No biliary dilatation. Pancreas: The pancreas is atrophic.  No focal mass is noted. Spleen: Normal in size without focal abnormality. Adrenals/Urinary Tract: Adrenal glands are within normal limits. Left  kidney is well visualize with small hypodensities representing cysts. No obstructive changes are noted. Right kidney demonstrates a dominant upper pole cystic lesion measuring 4.3 cm. It demonstrates some peripheral calcifications. This has increased in size from the prior CT from 2014 at which time it measured 2.9 cm. A medial simple cyst is noted within the right kidney as well. No obstructive changes are noted. No calculi are seen. The bladder is partially distended. Stomach/Bowel: The appendix  is within normal limits. The colon is predominately decompressed without significant inflammatory change. No small bowel or gastric abnormality is noted. Changes of prior gastric bypass are seen. Vascular/Lymphatic: Aortic atherosclerosis. No enlarged abdominal or pelvic lymph nodes. Reproductive: Status post hysterectomy. No adnexal masses. Other: No abdominal wall hernia or abnormality. No abdominopelvic ascites. Musculoskeletal: Degenerative changes of lumbar spine are noted. Chronic L1 compression deformity is seen. IMPRESSION: Small right pleural effusion. Cystic changes within the kidneys bilaterally. A large complicated cyst is noted in the upper pole of the right kidney which is increased in size from the prior exam (4.3 cm increased from 2.9 cm). Chronic changes as described above. Electronically Signed   By: Inez Catalina M.D.   On: 08/04/2019 20:08   Dg Chest Port 1 View  Result Date: 08/05/2019 CLINICAL DATA:  Cough EXAM: PORTABLE CHEST 1 VIEW COMPARISON:  12/08/2018 FINDINGS: Cardiac shadow is stable. The lungs are well aerated bilaterally. Coarsened interstitial markings are again identified bilaterally stable from previous exams. No acute infiltrate is seen. No sizable effusion is noted. No bony abnormality is seen. IMPRESSION: Chronic coarsened interstitial markings bilaterally stable from previous exam. Electronically Signed   By: Inez Catalina M.D.   On: 08/05/2019 00:48   Vas [place] Upper Extremity  Venous Duplex  Result Date: 08/06/2019 UPPER VENOUS STUDY  Indications: Swelling Limitations: Body habitus and poor ultrasound/tissue interface. Comparison Study: no prior Performing Technologist: Abram Sander RVS  Examination Guidelines: A complete evaluation includes B-mode imaging, spectral Doppler, color Doppler, and power Doppler as needed of all accessible portions of each vessel. Bilateral testing is considered an integral part of a complete examination. Limited examinations for reoccurring indications may be performed as noted.  Right Findings: +----------+------------+---------+-----------+----------+-------+  RIGHT      Compressible Phasicity Spontaneous Properties Summary  +----------+------------+---------+-----------+----------+-------+  Subclavian                 Yes        Yes                         +----------+------------+---------+-----------+----------+-------+  Left Findings: +----------+------------+---------+-----------+----------+--------------+  LEFT       Compressible Phasicity Spontaneous Properties    Summary      +----------+------------+---------+-----------+----------+--------------+  IJV                                                      Not visualized  +----------+------------+---------+-----------+----------+--------------+  Subclavian     Full        Yes        Yes                                +----------+------------+---------+-----------+----------+--------------+  Axillary       Full        Yes        Yes                                +----------+------------+---------+-----------+----------+--------------+  Brachial       Full        Yes        Yes                                +----------+------------+---------+-----------+----------+--------------+  Radial         Full                                                      +----------+------------+---------+-----------+----------+--------------+  Ulnar          Full                                                       +----------+------------+---------+-----------+----------+--------------+  Cephalic       Full                                                      +----------+------------+---------+-----------+----------+--------------+  Basilic        Full                                                      +----------+------------+---------+-----------+----------+--------------+  Summary:  Right: No evidence of thrombosis in the subclavian.  Left: No evidence of deep vein thrombosis in the upper extremity. No evidence of superficial vein thrombosis in the upper extremity.  *See table(s) above for measurements and observations.    Preliminary         Scheduled Meds:  aspirin  325 mg Oral Daily   atorvastatin  40 mg Oral q1800   diltiazem  180 mg Oral Daily   feeding supplement (ENSURE ENLIVE)  237 mL Oral TID BM   feeding supplement (PRO-STAT SUGAR FREE 64)  30 mL Oral TID WC   metoCLOPramide (REGLAN) injection  5 mg Intravenous Q6H   metoprolol succinate  50 mg Oral Daily   multivitamin with minerals  1 tablet Oral Daily   pantoprazole  40 mg Oral q1800   sertraline  100 mg Oral Daily   thiamine  100 mg Oral Daily   Continuous Infusions:  cefTRIAXone (ROCEPHIN)  IV     magnesium sulfate bolus IVPB       LOS: 1 day    Time spent: 25 minutes    Jae Dire, DO [facility] Pager [phone]  If 7PM-7AM, please contact night-coverage [email or web address] Password [email or web address] 08/06/2019, 1:52 PM

## 2019-08-06 NOTE — TOC Initial Note (Signed)
Transition of Care [facility]) - Initial/Assessment Note    Patient Details  [name] MRN: [id number] Date of Birth: [birth year]  Transition of Care [facility]) CM/SW Contact:    Doy Hutching, LCSWA Phone Number: 08/06/2019, 3:11 PM  Clinical Narrative:                 CSW spoke with pt at bedside. Pt sister also present. CSW introduced self, role, reason for visit. Due to previous stroke pt speech limited and she allowed her sister Adrienne Stone to complete assessment with CSW. Pt from [facility]- she has been there since February. Pt has survived a stroke, COVID, and continues to require multiple people to assist with most ADL/IADLs. Pt sister has completed Medicaid and pt was approved- encouraged her to call hospital admitting to add her Medicaid information onto the chart for her sister. Pt is LTC at [facility] and the plan is to return when she is medically stable- she will not need authorization under [employer]. She will need new COVID swab 24-48 hrs prior to dc.   Pt sister and pt aware that CSW remains available as needed.   Expected Discharge Plan: Skilled Nursing Facility Barriers to Discharge: Continued Medical Work up   Patient Goals and CMS Choice Patient states their goals for this hospitalization and ongoing recovery are:: for her to return to [email or web address].gov Compare Post Acute Care list provided to:: [name] Choice offered to / list presented to : Patient, Sibling  Expected Discharge Plan and Services Expected Discharge Plan: Skilled Nursing Facility In-house Referral: Clinical Social Work Discharge Planning Services: CM Consult Post Acute Care Choice: Skilled Nursing Facility, Resumption of Svcs/PTA Provider Living arrangements for the past 2 months: Skilled Nursing Facility  Prior Living Arrangements/Services Living arrangements for the past 2 months: Skilled Nursing Facility Lives with:: Facility  Resident Patient language and need for interpreter reviewed:: Yes(no needs) Do you feel safe going back to the place where you live?: Yes      Need for Family Participation in Patient Care: Yes (Comment)(assistance and support with decision making) Care giver support system in place?: Yes (comment)(pt sister/facility staff)   Criminal Activity/Legal Involvement Pertinent to Current Situation/Hospitalization: No - Comment as needed  Activities of Daily Living Home Assistive Devices/Equipment: Cane (specify quad or straight) ADL Screening (condition at time of admission) Patient's cognitive ability adequate to safely complete daily activities?: Yes Is the patient deaf or have difficulty hearing?: No Does the patient have difficulty seeing, even when wearing glasses/contacts?: No Does the patient have difficulty concentrating, remembering, or making decisions?: No Patient able to express need for assistance with ADLs?: Yes Does the patient have difficulty dressing or bathing?: No Independently performs ADLs?: Yes (appropriate for developmental age) Does the patient have difficulty walking or climbing stairs?: Yes Weakness of Legs: Both Weakness of Arms/Hands: None  Permission Sought/Granted Permission sought to share information with : Facility Medical sales representative, Family Supports Permission granted to share information with : Yes, Verbal Permission Granted  Share Information with NAME: Ermalene Searing  Permission granted to share info w AGENCY: [facility]  Permission granted to share info w Relationship: sister  Permission granted to share info w Contact Information: [phone]  Emotional Assessment Appearance:: Appears stated age Attitude/Demeanor/Rapport: Gracious Affect (typically observed): Quiet, Accepting Orientation: : Oriented to Self, Oriented to Place, Oriented to  Time, Oriented to Situation Alcohol / Substance Use: Not Applicable Psych Involvement: No  (comment)  Admission diagnosis:  Acute cystitis with hematuria [N30.01] Intractable vomiting, presence of nausea not specified, unspecified vomiting type [R11.10] Sepsis ([place]) [A41.9] Patient Active Problem List   Diagnosis Date Noted  . Sepsis ([place]) 08/05/2019  . Acute lower UTI 08/05/2019  . Hypokalemia 08/05/2019  . Hypomagnesemia 08/05/2019  . Dehydration 08/05/2019  . Chronic a-fib ([place]) 08/05/2019  . Acute metabolic encephalopathy 60/73/7106  . Encephalopathy acute 08/05/2019  . Dysphagia   . Oropharyngeal dysphagia   . Cerebrovascular accident (CVA) ([place])   . Goals of care, counseling/discussion   . Palliative care by specialist   . Acute ischemic stroke ([place]) 12/03/2018  . History of GI bleed 12/03/2018  . Depression 12/03/2018  . Hypertension 12/03/2018  . Gastric ulcer with hemorrhage   . Melena   . Unspecified atrial fibrillation ([place]) 11/10/2018  . Hypertensive urgency 11/05/2018  . Anemia 11/05/2018   PCP:  System, Pcp Not In Pharmacy:  No Pharmacies Listed    Social Determinants of Health (SDOH) Interventions    Readmission Risk Interventions Readmission Risk Prevention Plan 08/06/2019  Transportation Screening Complete  PCP or Specialist Appt within 3-5 Days Not Complete  Not Complete comments SNF pt  [place] or [place] Not Complete  HRI or Home Care Consult comments SNF pt  Social Work Consult for [place] Planning/Counseling Complete  Palliative Care Screening Not Complete  Palliative Care Screening Not Complete Comments may be worth considering will ask MD  Medication Review (RN Care Manager) Complete

## 2019-08-06 NOTE — Progress Notes (Signed)
Initial Nutrition Assessment  DOCUMENTATION CODES:   Obesity unspecified, Non-severe (moderate) malnutrition in context of chronic illness  INTERVENTION:   -Ensure Enlive po TID, each supplement provides 350 kcal and 20 grams of protein -30 ml Prostat TID, each supplement provides 100 kcals and 15 grams protein  NUTRITION DIAGNOSIS:   Moderate Malnutrition related to chronic illness(CVA) as evidenced by energy intake < 75% for > or equal to 1 month, mild fat depletion, edema, percent weight loss.  GOAL:   Patient will meet greater than or equal to 90% of their needs  MONITOR:   PO intake, Supplement acceptance, Diet advancement, Labs, Weight trends, Skin, I & O's  REASON FOR ASSESSMENT:   Consult Assessment of nutrition requirement/status  ASSESSMENT:   This is a 73 year old female who was admitted with past medical history of previous gastric ulcer and GI bleed, status post Roux-en-Y gastric bypass in 2000, atrial fibrillation not on anticoagulation secondary to bleeding, history of CVA with oropharyngeal dysphagia, chronic anemia and essential hypertension who presented to the ED on 10/28 with decreased p.o. intake x2 weeks, 3 weeks of nausea and vomiting. rapid Covid test was negative. In the ED the patient had a CT abdomen/pelvis which was without acute pathology, unremarkable lactic acid, normal ammonia.  She was found to have evidence of UTI and started on ceftriaxone and IV fluids.  She was admitted for acute metabolic encephalopathy and sepsis secondary to UTI.  Pt admitted with sepsis secondary to UTI.   Reviewed I/O's: +1.7 L x 24 hours  UOP: 500 ml x 24 hours  10/29- s/p BSE- advanced to dysphagia 3 diet with thin liquids  Pt lethargic at time of visit. She did not respond to touch or voice.   Spoke with pt sister at bedside, who reports that pt has experienced a general decline in health over the past 6 months. Per sister, pt has lost approximately 50 pounds over  the past 6 months due to poor appetite. Appetite has decreased significantly over the past 3 months, since pt was admitted to [facility] s/p stroke. Pt has been consuming mainly bites of meals; sister reports that they have worked closely with RD at [facility] to assist with food preferences and improve oral intake. For the past several weeks, pt has been more lethargic and consuming mainly liquids. Pt consumed a bite of banana and a few sips of juice. Observed two meal trays in room, which were untouched.   Discussed available oral nutrition supplements to assist with oral intake, as pt has been more accepting of liquids. Pt does not take lactose containing supplements well, but pt sister thinks she will consume Ensure and Prostat.   Reviewed wt hx; noted pt has experienced a 12.5% over the past 6 months, which is significant for time frame.    Medications reviewed and include reglan, cardizem,. MVI, and thiamine.   Labs reviewed.   NUTRITION - FOCUSED PHYSICAL EXAM:    Most Recent Value  Orbital Region  Mild depletion  Upper Arm Region  Mild depletion  Thoracic and Lumbar Region  No depletion  Buccal Region  Mild depletion  Temple Region  Mild depletion  Clavicle Bone Region  No depletion  Clavicle and Acromion Bone Region  No depletion  Scapular Bone Region  No depletion  Dorsal Hand  No depletion  Patellar Region  No depletion  Anterior Thigh Region  No depletion  Posterior Calf Region  No depletion  Edema (RD Assessment)  Moderate  Hair  Reviewed  Eyes  Reviewed  Mouth  Reviewed  Skin  Reviewed  Nails  Reviewed       Diet Order:   Diet Order            DIET DYS 3 Room service appropriate? Yes with Assist; Fluid consistency: Thin  Diet effective now              EDUCATION NEEDS:   Education needs have been addressed  Skin:  Skin Assessment: Reviewed RN Assessment  Last BM:  08/05/19  Height:   Ht Readings from Last 1 Encounters:  08/06/19 5\' 3"  (1.6 m)     Weight:   Wt Readings from Last 1 Encounters:  08/06/19 93.9 kg    Ideal Body Weight:  52.3 kg  BMI:  Body mass index is 36.67 kg/m.  Estimated Nutritional Needs:   Kcal:  1800-2000  Protein:  90-105 grams  Fluid:  > 1.8 L    Grisela Mesch A. Jimmye Norman, RD, LDN, Center Registered Dietitian II Certified Diabetes Care and Education Specialist Pager: [phone] After hours Pager: [phone]

## 2019-08-07 DIAGNOSIS — R638 Other symptoms and signs concerning food and fluid intake: Secondary | ICD-10-CM

## 2019-08-07 LAB — CBC
HCT: 32.2 % — ABNORMAL LOW (ref 36.0–46.0)
Hemoglobin: 10.5 g/dL — ABNORMAL LOW (ref 12.0–15.0)
MCH: 28.3 pg (ref 26.0–34.0)
MCHC: 32.6 g/dL (ref 30.0–36.0)
MCV: 86.8 fL (ref 80.0–100.0)
Platelets: 249 10*3/uL (ref 150–400)
RBC: 3.71 MIL/uL — ABNORMAL LOW (ref 3.87–5.11)
RDW: 14.8 % (ref 11.5–15.5)
WBC: 9.2 10*3/uL (ref 4.0–10.5)
nRBC: 0 % (ref 0.0–0.2)

## 2019-08-07 LAB — BASIC METABOLIC PANEL
Anion gap: 13 (ref 5–15)
BUN: 17 mg/dL (ref 8–23)
CO2: 19 mmol/L — ABNORMAL LOW (ref 22–32)
Calcium: 8.3 mg/dL — ABNORMAL LOW (ref 8.9–10.3)
Chloride: 108 mmol/L (ref 98–111)
Creatinine, Ser: 0.7 mg/dL (ref 0.44–1.00)
GFR calc Af Amer: >60 mL/min (ref >60)
GFR calc non Af Amer: >60 mL/min (ref >60)
Glucose, Bld: 75 mg/dL (ref 70–99)
Potassium: 3.3 mmol/L — ABNORMAL LOW (ref 3.5–5.1)
Sodium: 140 mmol/L (ref 135–145)

## 2019-08-07 LAB — URINE CULTURE: Culture: >=100000 — AB

## 2019-08-07 LAB — MAGNESIUM: Magnesium: 2 mg/dL (ref 1.7–2.4)

## 2019-08-07 LAB — SARS CORONAVIRUS 2 (TAT 6-24 HRS): SARS Coronavirus 2: NEGATIVE

## 2019-08-07 MED ORDER — POTASSIUM CHLORIDE 10 MEQ/100ML IV SOLN
10.0000 meq | INTRAVENOUS | Status: AC
  Administered 2019-08-07 (×2): 10 meq via INTRAVENOUS
  Filled 2019-08-07 (×2): qty 100

## 2019-08-07 MED ORDER — BOOST / RESOURCE BREEZE PO LIQD CUSTOM
1.0000 | Freq: Three times a day (TID) | ORAL | Status: DC
  Administered 2019-08-07: 1 via ORAL

## 2019-08-07 MED ORDER — SODIUM CHLORIDE 0.9 % IV SOLN
1.0000 g | INTRAVENOUS | Status: AC
  Administered 2019-08-07: 1 g via INTRAVENOUS
  Filled 2019-08-07: qty 1

## 2019-08-07 NOTE — TOC Progression Note (Addendum)
Transition of Care [facility]) - Progression Note    Patient Details  Name: TIPPI MCCRAE MRN: [id number] Date of Birth: 1946-08-22  Transition of Care [facility]) CM/SW [place], [place] Phone Number: 08/07/2019, 8:46 AM  Clinical Narrative:    10:15am- Pt will need a new COVID swab, MD secure chatted and will order.  8:46am- Per MD notes pt may be stable for dc today, have reached out to [facility] to assess need for new COVID test as last one was swabbed 10/28.   Expected Discharge Plan: Skilled Nursing Facility Barriers to Discharge: Continued Medical Work up  Expected Discharge Plan and Services Expected Discharge Plan: [place] In-house Referral: Clinical Social Work Discharge Planning Services: CM Consult Post Acute Care Choice: [place], Resumption of Svcs/PTA Provider Living arrangements for the past 2 months: [place]   Social Determinants of Health (SDOH) Interventions    Readmission Risk Interventions Readmission Risk Prevention Plan 08/06/2019  Transportation Screening Complete  PCP or Specialist Appt within 3-5 Days Not Complete  Not Complete comments SNF pt  [place] or [place] Not Complete  HRI or Home Care Consult comments SNF pt  Social Work Consult for [place] Planning/Counseling Complete  Palliative Care Screening Not Complete  Palliative Care Screening Not Complete Comments may be worth considering will ask MD  Medication Review (RN Care Manager) Complete

## 2019-08-07 NOTE — Progress Notes (Signed)
PROGRESS NOTE    ROSELLEN LICHTENBERGER   ZOX:[id number] DOB: 19-Mar-1946 DOA: 08/04/2019  Admitted from: [facility] PCP: System, Pcp Not In   Hospital Summary  This is a 73 year old female who was admitted with past medical history of previous gastric ulcer and GI bleed, status post Roux-en-Y gastric bypass in 2000, atrial fibrillation not on anticoagulation secondary to bleeding, history of CVA with oropharyngeal dysphagia, chronic anemia and essential hypertension who presented to the ED on 10/28 with decreased p.o. intake x2 weeks, 3 weeks of nausea and vomiting. rapid Covid test was negative. In the ED the patient had a CT abdomen/pelvis which was without acute pathology, unremarkable lactic acid, normal ammonia.  She was found to have evidence of UTI and started on ceftriaxone and IV fluids.  She was admitted for acute metabolic encephalopathy and sepsis secondary to UTI.  Patient underwent left upper extremity Doppler for LUE swelling which was negative for DVT.  Underwent bedside swallow eval by speech pathology and recommended dysphagia 3 diet.  Evaluated by PT/OT recommended for SNF placement.  A & P   Principal Problem:   Sepsis (HCC) Active Problems:   Anemia   History of GI bleed   Depression   Hypertension   Dysphagia   Acute lower UTI   Hypokalemia   Hypomagnesemia   Dehydration   Chronic a-fib (HCC)   Acute metabolic encephalopathy   Encephalopathy acute   Malnutrition of moderate degree  Sepsis secondary to E. coli UTI sepsis from admission resolved.  Asymptomatic.  Afebrile with resolved leukocytosis  continue IV antibiotics, last dose today of ceftriaxone  Dehydration  resolved with IV fluids.  Likely secondary to decreased p.o. intake and vomiting, suspect viral gastritis.    IV Protonix-> p.o. Protonix  Encourage PO intake  Acute metabolic encephalopathy secondary to sepsis and dehydration  resolved. seems at/near baseline  Plan as above  Large  complicated right renal cyst noted to have some calcifications, 2.9 cm-> 4.3 cm change from previous.    Consider further outpatient work-up  Tylenol  Left upper extremity edema -LUE Doppler final read negative, keep elevated Anemia -stable Depression -stable continue home meds Hypertension -stable on p.o. meds, Cardizem and metoprolol Hypokalemia -repleted follow-up in a.m. Hypomagnesemia -resolved Chronic atrial fibrillation not on anticoagulation due to history of GI bleed -continue Cardizem and Toprol History of CVA -continue full dose aspirin History of dysphagia -follow-up speech eval  DVT prophylaxis: SCDs   Code Status: DNR  Diet: Dysphagia 3 Family Communication: No family at bedside Disposition Plan: Pending resumption of diet.  Encourage p.o. intake.  Repeat Covid testing ordered.  Patient will need SNF placement at discharge  Consultants  . None  Antibiotics  Day 3/3 of ceftriaxone for E. coli UTI    Subjective   Patient is a poor historian No complaints today.  States she is able to tolerate liquids but does not have an appetite for food.  I encouraged her to eat lunch she was agreeable to.    Objective   Vitals:   08/06/19 1551 08/06/19 2038 08/07/19 0414 08/07/19 1103  BP:  129/76 (!) 107/54 (!) 142/83  Pulse:  81 97 97  Resp:   16   Temp:  98.7 F (37.1 C) 98.6 F (37 C)   TempSrc:  Oral Oral   SpO2:  96% 98%   Weight: 93.9 kg     Height:  (1.6 m)       Intake/Output Summary (Last 24 hours) at  08/07/2019 1210 Last data filed at 08/07/2019 0300 Gross per 24 hour  Intake 540.31 ml  Output -  Net 540.31 ml   Filed Weights   08/06/19 1551  Weight: 93.9 kg    Examination:  Physical Exam Vitals signs and nursing note reviewed.  Constitutional:      Appearance: She is obese.  HENT:     Head: Normocephalic and atraumatic.     Nose: Nose normal.     Mouth/Throat:     Mouth: Mucous membranes are moist.  Eyes:     Extraocular  Movements: Extraocular movements intact.  Neck:     Musculoskeletal: Normal range of motion.  Cardiovascular:     Rate and Rhythm: Normal rate and regular rhythm.  Pulmonary:     Effort: Pulmonary effort is normal.     Breath sounds: Normal breath sounds.  Abdominal:     General: Abdomen is flat. There is no distension.     Palpations: Abdomen is soft.     Tenderness: There is no abdominal tenderness.  Musculoskeletal: Normal range of motion.     Comments: LUE edema  Neurological:     Mental Status: She is alert and oriented to person, place, and time. Mental status is at baseline.  Psychiatric:        Mood and Affect: Mood normal.        Behavior: Behavior normal.     Comments: Simple minded appears at baseline.     Data Reviewed: I have personally reviewed following labs and imaging studies  CBC: Recent Labs  Lab 08/04/19 1750 08/05/19 0950 08/06/19 0712 08/07/19 0325  WBC 7.6 11.4* 9.9 9.2  NEUTROABS 5.9  --   --   --   HGB 11.3* 11.0* 11.0* 10.5*  HCT 35.0* 34.8* 33.1* 32.2*  MCV 89.1 90.6 86.2 86.8  PLT 222 223 211 249   Basic Metabolic Panel: Recent Labs  Lab 08/04/19 1750 08/05/19 0950 08/06/19 0712 08/06/19 0730 08/07/19 0325  NA 139 135 138  --  140  K 3.1* 3.2* 3.1*  --  3.3*  CL 102 107 103  --  108  CO2 22 22 18*  --  19*  GLUCOSE 89 77 64*  --  75  BUN 19 18 16   --  17  CREATININE 0.81 0.63 0.68  --  0.70  CALCIUM 8.1* 7.5* 8.2*  --  8.3*  MG  --  1.5*  --  1.8 2.0  PHOS  --  2.8  --   --   --    GFR: Estimated Creatinine Clearance: 66.2 mL/min (by C-G formula based on SCr of 0.7 mg/dL). Liver Function Tests: Recent Labs  Lab 08/04/19 1750 08/05/19 0950  AST 20 17  ALT 15 15  ALKPHOS 43 41  BILITOT 1.4* 1.0  PROT 5.0* 4.6*  ALBUMIN 2.2* 2.1*   Recent Labs  Lab 08/04/19 1750  LIPASE 25   Recent Labs  Lab 08/04/19 1750  AMMONIA 33   Coagulation Profile: No results for input(s): INR, PROTIME in the last 168 hours. Cardiac  Enzymes: No results for input(s): CKTOTAL, CKMB, CKMBINDEX, TROPONINI in the last 168 hours. BNP (last 3 results) No results for input(s): PROBNP in the last 8760 hours. HbA1C: No results for input(s): HGBA1C in the last 72 hours. CBG: Recent Labs  Lab 08/04/19 1757 08/05/19 0435  GLUCAP 93 78   Lipid Profile: No results for input(s): CHOL, HDL, LDLCALC, TRIG, CHOLHDL, LDLDIRECT in the last 72 hours. Thyroid  Function Tests: Recent Labs    08/05/19 0950  TSH 1.956   Anemia Panel: No results for input(s): VITAMINB12, FOLATE, FERRITIN, TIBC, IRON, RETICCTPCT in the last 72 hours. Sepsis Labs: Recent Labs  Lab 08/04/19 1750  LATICACIDVEN 1.1    Recent Results (from the past 240 hour(s))  SARS CORONAVIRUS 2 (TAT 6-24 HRS) Nasopharyngeal Nasopharyngeal Swab     Status: None   Collection Time: 08/04/19  8:09 PM   Specimen: Nasopharyngeal Swab  Result Value Ref Range Status   SARS Coronavirus 2 NEGATIVE NEGATIVE Final    Comment: (NOTE) SARS-CoV-2 target nucleic acids are NOT DETECTED. The SARS-CoV-2 RNA is generally detectable in upper and lower respiratory specimens during the acute phase of infection. Negative results do not preclude SARS-CoV-2 infection, do not rule out co-infections with other pathogens, and should not be used as the sole basis for treatment or other patient management decisions. Negative results must be combined with clinical observations, patient history, and epidemiological information. The expected result is Negative. Fact Sheet for Patients: HairSlick.no Fact Sheet for Healthcare Providers: quierodirigir.com This test is not yet approved or cleared by the [place] FDA and  has been authorized for detection and/or diagnosis of SARS-CoV-2 by FDA under an Emergency Use Authorization (EUA). This EUA will remain  in effect (meaning this test can be used) for the duration of the COVID-19  declaration under Section 56 4(b)(1) of the Act, 21 U.S.C. section 360bbb-3(b)(1), unless the authorization is terminated or revoked sooner. Performed at [facility], [address], [place], [place] [address]   Urine Culture     Status: Abnormal   Collection Time: 08/05/19 12:23 AM   Specimen: Urine, Clean Catch  Result Value Ref Range Status   Specimen Description URINE, CLEAN CATCH  Final   Special Requests   Final    NONE Performed at [facility], [address], [place], [place] [address]    Culture (A)  Final    >=100,000 COLONIES/mL ESCHERICHIA COLI 20,000 COLONIES/mL PSEUDOMONAS AERUGINOSA    Report Status 08/07/2019 FINAL  Final   Organism ID, Bacteria ESCHERICHIA COLI (A)  Final   Organism ID, Bacteria PSEUDOMONAS AERUGINOSA (A)  Final      Susceptibility   Escherichia coli - MIC*    AMPICILLIN 8 SENSITIVE Sensitive     CEFAZOLIN <=4 SENSITIVE Sensitive     CEFTRIAXONE <=1 SENSITIVE Sensitive     CIPROFLOXACIN <=0.25 SENSITIVE Sensitive     GENTAMICIN <=1 SENSITIVE Sensitive     IMIPENEM 0.5 SENSITIVE Sensitive     NITROFURANTOIN <=16 SENSITIVE Sensitive     TRIMETH/SULFA <=20 SENSITIVE Sensitive     AMPICILLIN/SULBACTAM 4 SENSITIVE Sensitive     PIP/TAZO <=4 SENSITIVE Sensitive     Extended ESBL NEGATIVE Sensitive     * >=100,000 COLONIES/mL ESCHERICHIA COLI   Pseudomonas aeruginosa - MIC*    CEFTAZIDIME 16 INTERMEDIATE Intermediate     CIPROFLOXACIN 0.5 SENSITIVE Sensitive     GENTAMICIN <=1 SENSITIVE Sensitive     IMIPENEM 1 SENSITIVE Sensitive     PIP/TAZO 8 SENSITIVE Sensitive     CEFEPIME INTERMEDIATE Intermediate     * 20,000 COLONIES/mL PSEUDOMONAS AERUGINOSA         Radiology Studies: Vas [place] Upper Extremity Venous Duplex  Result Date: 08/06/2019 UPPER VENOUS STUDY  Indications: Swelling Limitations: Body habitus and poor ultrasound/tissue interface. Comparison Study: no prior Performing Technologist: Blanch Media RVS  Examination  Guidelines: A complete evaluation includes B-mode imaging,  spectral Doppler, color Doppler, and power Doppler as needed of all accessible portions of each vessel. Bilateral testing is considered an integral part of a complete examination. Limited examinations for reoccurring indications may be performed as noted.  Right Findings: +----------+------------+---------+-----------+----------+-------+ RIGHT     CompressiblePhasicitySpontaneousPropertiesSummary +----------+------------+---------+-----------+----------+-------+ Subclavian               Yes       Yes                      +----------+------------+---------+-----------+----------+-------+  Left Findings: +----------+------------+---------+-----------+----------+--------------+ LEFT      CompressiblePhasicitySpontaneousProperties   Summary     +----------+------------+---------+-----------+----------+--------------+ IJV                                                 Not visualized +----------+------------+---------+-----------+----------+--------------+ Subclavian    Full       Yes       Yes                             +----------+------------+---------+-----------+----------+--------------+ Axillary      Full       Yes       Yes                             +----------+------------+---------+-----------+----------+--------------+ Brachial      Full       Yes       Yes                             +----------+------------+---------+-----------+----------+--------------+ Radial        Full                                                 +----------+------------+---------+-----------+----------+--------------+ Ulnar         Full                                                 +----------+------------+---------+-----------+----------+--------------+ Cephalic      Full                                                 +----------+------------+---------+-----------+----------+--------------+ Basilic        Full                                                 +----------+------------+---------+-----------+----------+--------------+  Summary:  Right: No evidence of thrombosis in the subclavian.  Left: No evidence of deep vein thrombosis in the upper extremity. No evidence of superficial vein thrombosis in the upper extremity.  *See table(s) above for measurements and observations.  Diagnosing physician: Deitra Mayo MD Electronically signed by Deitra Mayo MD on 08/06/2019 at 2:20:21  PM.    Final         Scheduled Meds: . aspirin  325 mg Oral Daily  . atorvastatin  40 mg Oral q1800  . diltiazem  180 mg Oral Daily  . feeding supplement (ENSURE ENLIVE)  237 mL Oral TID BM  . feeding supplement (PRO-STAT SUGAR FREE 64)  30 mL Oral TID WC  . metoCLOPramide (REGLAN) injection  5 mg Intravenous Q6H  . metoprolol succinate  50 mg Oral Daily  . multivitamin with minerals  1 tablet Oral Daily  . pantoprazole  40 mg Oral q1800  . sertraline  100 mg Oral Daily  . thiamine  100 mg Oral Daily   Continuous Infusions: . cefTRIAXone (ROCEPHIN)  IV 1 g (08/06/19 2311)     LOS: 2 days    Time spent: 15 minutes    Jae Dire, DO [facility] Pager [phone]  If 7PM-7AM, please contact night-coverage [email or web address] Password [email or web address] 08/07/2019, 12:10 PM

## 2019-08-07 NOTE — Progress Notes (Signed)
During shift change patient sister at bedside and patient in expectation of DC tonight as patient stated DR told her she would be going back to [facility] this evening. No DC orders currently, COVID came back negative. This nurse reached out to Dr Kennon Holter for updates awaiting response to pass update along to patient.

## 2019-08-08 LAB — BASIC METABOLIC PANEL
Anion gap: 11 (ref 5–15)
BUN: 17 mg/dL (ref 8–23)
CO2: 21 mmol/L — ABNORMAL LOW (ref 22–32)
Calcium: 8.3 mg/dL — ABNORMAL LOW (ref 8.9–10.3)
Chloride: 108 mmol/L (ref 98–111)
Creatinine, Ser: 0.69 mg/dL (ref 0.44–1.00)
GFR calc Af Amer: >60 mL/min (ref >60)
GFR calc non Af Amer: >60 mL/min (ref >60)
Glucose, Bld: 70 mg/dL (ref 70–99)
Potassium: 3.1 mmol/L — ABNORMAL LOW (ref 3.5–5.1)
Sodium: 140 mmol/L (ref 135–145)

## 2019-08-08 LAB — CBC
HCT: 30.6 % — ABNORMAL LOW (ref 36.0–46.0)
Hemoglobin: 10.1 g/dL — ABNORMAL LOW (ref 12.0–15.0)
MCH: 28.9 pg (ref 26.0–34.0)
MCHC: 33 g/dL (ref 30.0–36.0)
MCV: 87.4 fL (ref 80.0–100.0)
Platelets: 190 10*3/uL (ref 150–400)
RBC: 3.5 MIL/uL — ABNORMAL LOW (ref 3.87–5.11)
RDW: 14.9 % (ref 11.5–15.5)
WBC: 8.8 10*3/uL (ref 4.0–10.5)
nRBC: 0 % (ref 0.0–0.2)

## 2019-08-08 NOTE — Discharge Summary (Signed)
Physician Discharge Summary  Adrienne Stone UJW:[id number] DOB: 1945/11/21 DOA: 08/04/2019  PCP: System, Pcp Not In  Admit date: 08/04/2019 Discharge date: 08/08/2019  Admitted From: [facility] Discharged to:  [facility] Equipment/Devices:None  Discharge Condition: stable  Recommendations for Outpatient Follow-up    1. Follow up with PCP in 1-2 weeks 2. Please follow up BMP/CBC 3. Encourage PO intake 4. Consider further workup for right renal cyst   Hospital Summary  This is a 73 year old female who was admitted with past medical history of previous gastric ulcer and GI bleed, status post Roux-en-Y gastric bypass in 2000, atrial fibrillation not on anticoagulation secondary to bleeding, history of CVA with oropharyngeal dysphagia, chronic anemia and essential hypertension who presented to the ED on 10/28 with decreased p.o. intake x2 weeks, 3 weeks of nausea and vomiting. rapid Covid test was negative. In the ED the patient had a CT abdomen/pelvis which was without acute pathology, unremarkable lactic acid, normal ammonia. She was found to have evidence of sepsis with tachycardia, leukocytosis and encephalopathy secondary to UTI and started on ceftriaxone and IV fluids.   Patient underwent left upper extremity Doppler for LUE swelling which was negative for DVT.  Underwent bedside swallow eval by speech pathology and recommended dysphagia 3 diet.  Evaluated by PT/OT recommended for SNF placement.   Patient had prolonged stay due to decreased appetite and decreased PO intake. This appears to be a new baseline for her and has been going on for several months. She was noted to eat with encouragement day of discharge and discharged in stable condition.   A & P   Principal Problem:   Sepsis (HCC) Active Problems:   Anemia   History of GI bleed   Depression   Hypertension   Dysphagia   Acute lower UTI   Hypokalemia   Hypomagnesemia   Dehydration   Chronic a-fib (HCC)    Acute metabolic encephalopathy   Encephalopathy acute   Malnutrition of moderate degree    Sepsis secondary to E. coli UTI resolved. Completed 3 days ceftriaxone    Dehydration resolved with IV fluids.    Encourage PO intake  Acute metabolic encephalopathy secondary to sepsis and dehydration  Now at baseline  Large complicated right renal cystnoted to have some calcifications, 2.9 cm->4.3 cm change from previous.   Recommend further outpatient work-up  Tylenol  Left upper extremity edema-LUE Doppler final read negative, keep elevated Anemia-stable Depression-stable continue home meds Hypertension-stable on p.o. meds, Cardizem and metoprolol Hypokalemia-repleted follow-up in a.m. Hypomagnesemia-resolved Chronic atrial fibrillation not on anticoagulation due to history of GI bleed-continue Cardizem and Toprol History of CVA-continue full dose aspirin History of dysphagia-follow-up speech eval   COVID negative x 2 Discussed with sister by phone    Code Status: DNR Diet recommendation: Dysphagia soft  Consultants  . none  Procedures  . LUE doppler  Antibiotics  Patient completed 3/3 days of ceftriaxone  For E. coli UTI    Subjective  Patient seen and examined at bedside no acute distress and resting comfortably.  No events overnight.  Tolerating diet with encouragement.   Denies any chest pain, shortness of breath, fever, nausea, vomiting, urinary or bowel complaints. Otherwise ROS negative   Objective   Discharge Exam: Vitals:   08/07/19 2145 08/08/19 0424  BP: 133/81 129/74  Pulse: (!) 103 88  Resp: 20 16  Temp: 97.9 F (36.6 C) 97.7 F (36.5 C)  SpO2: 96% 98%   Vitals:   08/07/19 1103 08/07/19 1347  08/07/19 2145 08/08/19 0424  BP: (!) 142/83 133/67 133/81 129/74  Pulse: 97 88 (!) 103 88  Resp:  20 20 16   Temp:  97.7 F (36.5 C) 97.9 F (36.6 C) 97.7 F (36.5 C)  TempSrc:  Oral Oral Oral  SpO2:  97% 96% 98%  Weight:       Height:        Physical Exam Vitals signs and nursing note reviewed.  Constitutional:      Appearance: She is obese.  HENT:     Head: Normocephalic and atraumatic.     Nose: Nose normal.     Mouth/Throat:     Mouth: Mucous membranes are moist.  Eyes:     Extraocular Movements: Extraocular movements intact.  Neck:     Musculoskeletal: Normal range of motion. No neck rigidity.  Cardiovascular:     Rate and Rhythm: Normal rate and regular rhythm.  Pulmonary:     Effort: Pulmonary effort is normal.     Breath sounds: Normal breath sounds.  Abdominal:     General: Abdomen is flat.     Palpations: Abdomen is soft.  Musculoskeletal: Normal range of motion.     Comments: LUE chronic swelling  Neurological:     Mental Status: She is alert and oriented to person, place, and time. Mental status is at baseline.  Psychiatric:        Mood and Affect: Mood normal.        Behavior: Behavior normal.       The results of significant diagnostics from this hospitalization (including imaging, microbiology, ancillary and laboratory) are listed below for reference.     Microbiology: Recent Results (from the past 240 hour(s))  SARS CORONAVIRUS 2 (TAT 6-24 HRS) Nasopharyngeal Nasopharyngeal Swab     Status: None   Collection Time: 08/04/19  8:09 PM   Specimen: Nasopharyngeal Swab  Result Value Ref Range Status   SARS Coronavirus 2 NEGATIVE NEGATIVE Final    Comment: (NOTE) SARS-CoV-2 target nucleic acids are NOT DETECTED. The SARS-CoV-2 RNA is generally detectable in upper and lower respiratory specimens during the acute phase of infection. Negative results do not preclude SARS-CoV-2 infection, do not rule out co-infections with other pathogens, and should not be used as the sole basis for treatment or other patient management decisions. Negative results must be combined with clinical observations, patient history, and epidemiological information. The expected result is  Negative. Fact Sheet for Patients: SugarRoll.be Fact Sheet for Healthcare Providers: https://www.woods-mathews.com/ This test is not yet approved or cleared by the [place] FDA and  has been authorized for detection and/or diagnosis of SARS-CoV-2 by FDA under an Emergency Use Authorization (EUA). This EUA will remain  in effect (meaning this test can be used) for the duration of the COVID-19 declaration under Section 56 4(b)(1) of the Act, 21 U.S.C. section 360bbb-3(b)(1), unless the authorization is terminated or revoked sooner. Performed at [facility], [place] [address], [place], [place] [address]   Urine Culture     Status: Abnormal   Collection Time: 08/05/19 12:23 AM   Specimen: Urine, Clean Catch  Result Value Ref Range Status   Specimen Description URINE, CLEAN CATCH  Final   Special Requests   Final    NONE Performed at [facility], [place] [address], [place], [place] [address]    Culture (A)  Final    >=100,000 COLONIES/mL ESCHERICHIA COLI 20,000 COLONIES/mL PSEUDOMONAS AERUGINOSA    Report Status 08/07/2019 FINAL  Final  Organism ID, Bacteria ESCHERICHIA COLI (A)  Final   Organism ID, Bacteria PSEUDOMONAS AERUGINOSA (A)  Final      Susceptibility   Escherichia coli - MIC*    AMPICILLIN 8 SENSITIVE Sensitive     CEFAZOLIN <=4 SENSITIVE Sensitive     CEFTRIAXONE <=1 SENSITIVE Sensitive     CIPROFLOXACIN <=0.25 SENSITIVE Sensitive     GENTAMICIN <=1 SENSITIVE Sensitive     IMIPENEM 0.5 SENSITIVE Sensitive     NITROFURANTOIN <=16 SENSITIVE Sensitive     TRIMETH/SULFA <=20 SENSITIVE Sensitive     AMPICILLIN/SULBACTAM 4 SENSITIVE Sensitive     PIP/TAZO <=4 SENSITIVE Sensitive     Extended ESBL NEGATIVE Sensitive     * >=100,000 COLONIES/mL ESCHERICHIA COLI   Pseudomonas aeruginosa - MIC*    CEFTAZIDIME 16 INTERMEDIATE Intermediate     CIPROFLOXACIN 0.5 SENSITIVE Sensitive     GENTAMICIN <=1 SENSITIVE Sensitive      IMIPENEM 1 SENSITIVE Sensitive     PIP/TAZO 8 SENSITIVE Sensitive     CEFEPIME INTERMEDIATE Intermediate     * 20,000 COLONIES/mL PSEUDOMONAS AERUGINOSA  SARS CORONAVIRUS 2 (TAT 6-24 HRS) Nasopharyngeal Nasopharyngeal Swab     Status: None   Collection Time: 08/07/19 11:24 AM   Specimen: Nasopharyngeal Swab  Result Value Ref Range Status   SARS Coronavirus 2 NEGATIVE NEGATIVE Final    Comment: (NOTE) SARS-CoV-2 target nucleic acids are NOT DETECTED. The SARS-CoV-2 RNA is generally detectable in upper and lower respiratory specimens during the acute phase of infection. Negative results do not preclude SARS-CoV-2 infection, do not rule out co-infections with other pathogens, and should not be used as the sole basis for treatment or other patient management decisions. Negative results must be combined with clinical observations, patient history, and epidemiological information. The expected result is Negative. Fact Sheet for Patients: HairSlick.no Fact Sheet for Healthcare Providers: quierodirigir.com This test is not yet approved or cleared by the [place] FDA and  has been authorized for detection and/or diagnosis of SARS-CoV-2 by FDA under an Emergency Use Authorization (EUA). This EUA will remain  in effect (meaning this test can be used) for the duration of the COVID-19 declaration under Section 56 4(b)(1) of the Act, 21 U.S.C. section 360bbb-3(b)(1), unless the authorization is terminated or revoked sooner. Performed at [facility], [address] [place], [place] [address]      Labs: BNP (last 3 results) No results for input(s): BNP in the last 8760 hours. Basic Metabolic Panel: Recent Labs  Lab 08/04/19 1750 08/05/19 0950 08/06/19 0712 08/06/19 0730 08/07/19 0325 08/08/19 0314  NA 139 135 138  --  140 140  K 3.1* 3.2* 3.1*  --  3.3* 3.1*  CL 102 107 103  --  108 108  CO2 22 22 18*  --  19*  21*  GLUCOSE 89 77 64*  --  75 70  BUN --  17 17  CREATININE 0.81 0.63 0.68  --  0.70 0.69  CALCIUM 8.1* 7.5* 8.2*  --  8.3* 8.3*  MG  --  1.5*  --  1.8 2.0  --   PHOS  --  2.8  --   --   --   --    Liver Function Tests: Recent Labs  Lab 08/04/19 1750 08/05/19 0950  AST 20 17  ALT 15 15  ALKPHOS 43 41  BILITOT 1.4* 1.0  PROT 5.0* 4.6*  ALBUMIN 2.2* 2.1*   Recent Labs  Lab 08/04/19  1750  LIPASE 25   Recent Labs  Lab 08/04/19 1750  AMMONIA 33   CBC: Recent Labs  Lab 08/04/19 1750 08/05/19 0950 08/06/19 0712 08/07/19 0325 08/08/19 0314  WBC 7.6 11.4* 9.9 9.2 8.8  NEUTROABS 5.9  --   --   --   --   HGB 11.3* 11.0* 11.0* 10.5* 10.1*  HCT 35.0* 34.8* 33.1* 32.2* 30.6*  MCV 89.1 90.6 86.2 86.8 87.4  PLT 222 223 211 249 190   Cardiac Enzymes: No results for input(s): CKTOTAL, CKMB, CKMBINDEX, TROPONINI in the last 168 hours. BNP: Invalid input(s): POCBNP CBG: Recent Labs  Lab 08/04/19 1757 08/05/19 0435  GLUCAP 93 78   D-Dimer No results for input(s): DDIMER in the last 72 hours. Hgb A1c No results for input(s): HGBA1C in the last 72 hours. Lipid Profile No results for input(s): CHOL, HDL, LDLCALC, TRIG, CHOLHDL, LDLDIRECT in the last 72 hours. Thyroid function studies No results for input(s): TSH, T4TOTAL, T3FREE, THYROIDAB in the last 72 hours.  Invalid input(s): FREET3 Anemia work up No results for input(s): VITAMINB12, FOLATE, FERRITIN, TIBC, IRON, RETICCTPCT in the last 72 hours. Urinalysis    Component Value Date/Time   COLORURINE AMBER (A) 08/04/2019 2058   APPEARANCEUR TURBID (A) 08/04/2019 2058   LABSPEC >1.046 (H) 08/04/2019 2058   PHURINE 6.0 08/04/2019 2058   GLUCOSEU NEGATIVE 08/04/2019 2058   HGBUR MODERATE (A) 08/04/2019 2058   BILIRUBINUR SMALL (A) 08/04/2019 2058   KETONESUR 20 (A) 08/04/2019 2058   PROTEINUR 30 (A) 08/04/2019 2058   NITRITE NEGATIVE 08/04/2019 2058   LEUKOCYTESUR MODERATE (A) 08/04/2019 2058    Sepsis Labs Invalid input(s): PROCALCITONIN,  WBC,  LACTICIDVEN Microbiology Recent Results (from the past 240 hour(s))  SARS CORONAVIRUS 2 (TAT 6-24 HRS) Nasopharyngeal Nasopharyngeal Swab     Status: None   Collection Time: 08/04/19  8:09 PM   Specimen: Nasopharyngeal Swab  Result Value Ref Range Status   SARS Coronavirus 2 NEGATIVE NEGATIVE Final    Comment: (NOTE) SARS-CoV-2 target nucleic acids are NOT DETECTED. The SARS-CoV-2 RNA is generally detectable in upper and lower respiratory specimens during the acute phase of infection. Negative results do not preclude SARS-CoV-2 infection, do not rule out co-infections with other pathogens, and should not be used as the sole basis for treatment or other patient management decisions. Negative results must be combined with clinical observations, patient history, and epidemiological information. The expected result is Negative. Fact Sheet for Patients: HairSlick.no Fact Sheet for Healthcare Providers: quierodirigir.com This test is not yet approved or cleared by the [place] FDA and  has been authorized for detection and/or diagnosis of SARS-CoV-2 by FDA under an Emergency Use Authorization (EUA). This EUA will remain  in effect (meaning this test can be used) for the duration of the COVID-19 declaration under Section 56 4(b)(1) of the Act, 21 U.S.C. section 360bbb-3(b)(1), unless the authorization is terminated or revoked sooner. Performed at [facility], [address], [place], [place] [address]   Urine Culture     Status: Abnormal   Collection Time: 08/05/19 12:23 AM   Specimen: Urine, Clean Catch  Result Value Ref Range Status   Specimen Description URINE, CLEAN CATCH  Final   Special Requests   Final    NONE Performed at [facility], [address] [place], [place] [address]    Culture (A)  Final    >=100,000 COLONIES/mL ESCHERICHIA  COLI 20,000 COLONIES/mL PSEUDOMONAS AERUGINOSA    Report Status  08/07/2019 FINAL  Final   Organism ID, Bacteria ESCHERICHIA COLI (A)  Final   Organism ID, Bacteria PSEUDOMONAS AERUGINOSA (A)  Final      Susceptibility   Escherichia coli - MIC*    AMPICILLIN 8 SENSITIVE Sensitive     CEFAZOLIN <=4 SENSITIVE Sensitive     CEFTRIAXONE <=1 SENSITIVE Sensitive     CIPROFLOXACIN <=0.25 SENSITIVE Sensitive     GENTAMICIN <=1 SENSITIVE Sensitive     IMIPENEM 0.5 SENSITIVE Sensitive     NITROFURANTOIN <=16 SENSITIVE Sensitive     TRIMETH/SULFA <=20 SENSITIVE Sensitive     AMPICILLIN/SULBACTAM 4 SENSITIVE Sensitive     PIP/TAZO <=4 SENSITIVE Sensitive     Extended ESBL NEGATIVE Sensitive     * >=100,000 COLONIES/mL ESCHERICHIA COLI   Pseudomonas aeruginosa - MIC*    CEFTAZIDIME 16 INTERMEDIATE Intermediate     CIPROFLOXACIN 0.5 SENSITIVE Sensitive     GENTAMICIN <=1 SENSITIVE Sensitive     IMIPENEM 1 SENSITIVE Sensitive     PIP/TAZO 8 SENSITIVE Sensitive     CEFEPIME INTERMEDIATE Intermediate     * 20,000 COLONIES/mL PSEUDOMONAS AERUGINOSA  SARS CORONAVIRUS 2 (TAT 6-24 HRS) Nasopharyngeal Nasopharyngeal Swab     Status: None   Collection Time: 08/07/19 11:24 AM   Specimen: Nasopharyngeal Swab  Result Value Ref Range Status   SARS Coronavirus 2 NEGATIVE NEGATIVE Final    Comment: (NOTE) SARS-CoV-2 target nucleic acids are NOT DETECTED. The SARS-CoV-2 RNA is generally detectable in upper and lower respiratory specimens during the acute phase of infection. Negative results do not preclude SARS-CoV-2 infection, do not rule out co-infections with other pathogens, and should not be used as the sole basis for treatment or other patient management decisions. Negative results must be combined with clinical observations, patient history, and epidemiological information. The expected result is Negative. Fact Sheet for Patients: HairSlick.no Fact Sheet for  Healthcare Providers: quierodirigir.com This test is not yet approved or cleared by the [place] FDA and  has been authorized for detection and/or diagnosis of SARS-CoV-2 by FDA under an Emergency Use Authorization (EUA). This EUA will remain  in effect (meaning this test can be used) for the duration of the COVID-19 declaration under Section 56 4(b)(1) of the Act, 21 U.S.C. section 360bbb-3(b)(1), unless the authorization is terminated or revoked sooner. Performed at [facility], [address], [place], [place] [address]     Discharge Instructions     Discharge Instructions    DIET SOFT   Complete by: As directed    Diet - low sodium heart healthy   Complete by: As directed    Discharge instructions   Complete by: As directed    You were treated in the hospital for sepsis from a UTI and decreased eating. You were treated with antibiotics and fluids and improved.   Upon discharge: - Continue to increase your food intake - Take medications as prescribed  If you have any significant change or worsening of your symptoms then return to the hospital or call your PCP   Increase activity slowly   Complete by: As directed      Allergies as of 08/08/2019      Reactions   Milk-related Compounds Nausea Only, Other (See Comments)   Upsets the patient's stomach   Demerol [meperidine] Rash   Penicillins Rash   Did it involve swelling of the face/tongue/throat, SOB, or low BP? Unknown Did it involve sudden or severe rash/hives, skin peeling, or any reaction on the  inside of your mouth or nose? Unknown Did you need to seek medical attention at a hospital or doctor's office? Unknown When did it last happen? 15 years ago If all above answers are "NO", may proceed with cephalosporin use.   Prozac [fluoxetine Hcl] Palpitations   Serzone [nefazodone] Rash   Sulfa Antibiotics Rash      Medication List    STOP taking these medications    HYDROcodone-acetaminophen 5-325 MG tablet Commonly known as: NORCO/VICODIN   LORazepam 0.5 MG tablet Commonly known as: ATIVAN   metoprolol tartrate 50 MG tablet Commonly known as: LOPRESSOR   QUEtiapine 25 MG tablet Commonly known as: SEROQUEL     TAKE these medications   acetaminophen 500 MG tablet Commonly known as: TYLENOL Take 1,000 mg by mouth every 8 (eight) hours as needed for mild pain.   aspirin 325 MG tablet Take 1 tablet (325 mg total) by mouth daily.   atorvastatin 40 MG tablet Commonly known as: LIPITOR Take 1 tablet (40 mg total) by mouth daily at 6 PM.   calcium-vitamin D 500-200 MG-UNIT tablet Commonly known as: OSCAL WITH D Take 1 tablet by mouth daily with breakfast.   diltiazem 180 MG 24 hr capsule Commonly known as: DILACOR XR Take 180 mg by mouth daily. What changed: Another medication with the same name was removed. Continue taking this medication, and follow the directions you see here.   guaiFENesin 600 MG 12 hr tablet Commonly known as: MUCINEX Take 600 mg by mouth 2 (two) times daily.   Lidocaine 4 % Ptch Apply 1 patch topically daily. To the mid-back What changed: Another medication with the same name was removed. Continue taking this medication, and follow the directions you see here.   lisinopril 2.5 MG tablet Commonly known as: ZESTRIL Take 2.5 mg by mouth daily.   Magnesium 250 MG Tabs Take 250 mg by mouth daily.   Melatonin 3 MG Tabs Take 3 mg by mouth at bedtime.   metoprolol succinate 50 MG 24 hr tablet Commonly known as: TOPROL-XL Take 50 mg by mouth daily. Take with or immediately following a meal.   mirtazapine 15 MG tablet Commonly known as: REMERON Take 1 tablet (15 mg total) by mouth at bedtime.   Natural Balance Tears 0.1-0.3 % Soln Generic drug: Dextran 70-Hypromellose Place 2 drops into both eyes 3 (three) times daily.   omeprazole 40 MG capsule Commonly known as: PriLOSEC crack open and ingest contents to  maximize absorption of PPI given your gastric bypass state What changed:   how much to take  how to take this  when to take this   ondansetron 4 MG tablet Commonly known as: ZOFRAN Take 4 mg by mouth every 4 (four) hours as needed for nausea or vomiting.   senna-docusate 8.6-50 MG tablet Commonly known as: Senokot-S Take 1 tablet by mouth at bedtime as needed for mild constipation. What changed: when to take this   sertraline 100 MG tablet Commonly known as: ZOLOFT Take 100 mg by mouth daily.   traZODone 150 MG tablet Commonly known as: DESYREL Take 150 mg by mouth at bedtime.      Contact information for after-discharge care    Destination    [facility] Preferred [facility] .   Service: Skilled Nursing Contact information: [address] [place] [id number] [phone]             Allergies  Allergen Reactions  . Milk-Related Compounds Nausea Only and Other (See Comments)  Upsets the patient's stomach  . Demerol [Meperidine] Rash  . Penicillins Rash    Did it involve swelling of the face/tongue/throat, SOB, or low BP? Unknown Did it involve sudden or severe rash/hives, skin peeling, or any reaction on the inside of your mouth or nose? Unknown Did you need to seek medical attention at a hospital or doctor's office? Unknown When did it last happen? 15 years ago If all above answers are "NO", may proceed with cephalosporin use.   . Prozac [Fluoxetine Hcl] Palpitations  . Serzone [Nefazodone] Rash  . Sulfa Antibiotics Rash    Time coordinating discharge: Over 30 minutes   SIGNED:   Jae Dire, D.O. [facility] 08/08/2019, 10:03 AM

## 2019-08-08 NOTE — Plan of Care (Signed)
Pt for discharge going to [facility] given report to Courtney Heys, LPN, DNR with yellow copy, to be pick up by [facility] at 1300. Discontinued peripheral IV line, pt alert and oriented able to tolerate her food with assistance, decline any pain, room air, covid negative, no s/s of distress, POA aware about the discharge.

## 2019-08-08 NOTE — TOC Transition Note (Signed)
Transition of Care [facility]) - CM/SW Discharge Note   Patient Details  [name] MRN: [id number] Date of Birth: 1946/07/27  Transition of Care [facility]) CM/SW Contact:  Adrienne Stone Phone Number: 08/08/2019, 10:57 AM   Clinical Narrative:     Patient will Discharge To: Adrienne Stone Anticipated DC Date:08/08/2019 Family Notified:yes, left voice message for Adrienne Stone [phone] Transport By: Adrienne Stone   Per MD patient ready for DC to [facility]. RN, patient, patient's family, and facility notified of DC. Assessment, Fl2/Pasrr, and Discharge Summary sent to facility. RN given number for report [phone], Room 702P). DC packet on chart. Ambulance transport requested for patient. Called for a 1:00pm transport.  CSW signing off.  Adrienne Stone [facility] [phone]    Final next level of care: Skilled Nursing Facility Barriers to Discharge: No Barriers Identified   Patient Goals and CMS Choice Patient states their goals for this hospitalization and ongoing recovery are:: for [facility] to [facility].gov Compare Post Acute Care list provided to:: [name] Choice offered to / list presented to : Patient, Sibling  Discharge Placement              Patient chooses bed at: [facility] Patient to be transferred to facility by: [place] Name of family member notified: Adrienne Stone Patient and family notified of of transfer: 08/08/19  Discharge Plan and Services In-house Referral: Clinical Social Work Discharge Planning Services: CM Consult Post Acute Care Choice: [place], Resumption of Svcs/PTA Provider                               Social Determinants of Health (SDOH) Interventions     Readmission Risk Interventions Readmission Risk Prevention Plan 08/06/2019  Transportation Screening Complete  PCP or Specialist Appt within 3-5 Days Not Complete  Not Complete  comments SNF pt  [place] or [place] Not Complete  HRI or Home Care Consult comments SNF pt  Social Work Consult for [place] Planning/Counseling Complete  Palliative Care Screening Not Complete  Palliative Care Screening Not Complete Comments may be worth considering will ask MD  Medication Review Press photographer) Complete

## 2019-08-08 NOTE — Progress Notes (Signed)
Attempted to order patient bagel but due to allergy dietary cannot send a bagel but ordered two kinds of toast as patient requested she'd like that for breakfast.

## 2019-08-11 LAB — VITAMIN B1: Vitamin B1 (Thiamine): 250.2 nmol/L — ABNORMAL HIGH (ref 66.5–200.0)

## 2019-09-07 DEATH — deceased

## 2020-10-23 IMAGING — CT CT ANGIO CHEST
2 of 10 series · 18 of 36 positions shown · IV contrast (iopamidol)
Comparison: None.

CLINICAL DATA: Shortness of breath

EXAM:
CT ANGIOGRAPHY CHEST WITH CONTRAST
TECHNIQUE: Multidetector CT imaging of the chest was performed using the
standard protocol during bolus administration of intravenous
contrast. Multiplanar CT image reconstructions and MIPs were
obtained to evaluate the vascular anatomy.
CONTRAST:  100mL [id number] IOPAMIDOL ([id number]) INJECTION 76%

[Series 8: pe thins · axial · 0.77mm/px · z∈[+1128,+1361]mm · 17 of 263 slices shown]
[im 15/263  lung]
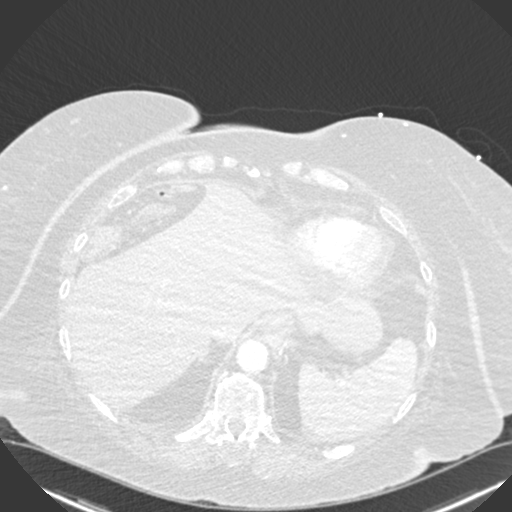
[im 30/263  mediastinal]
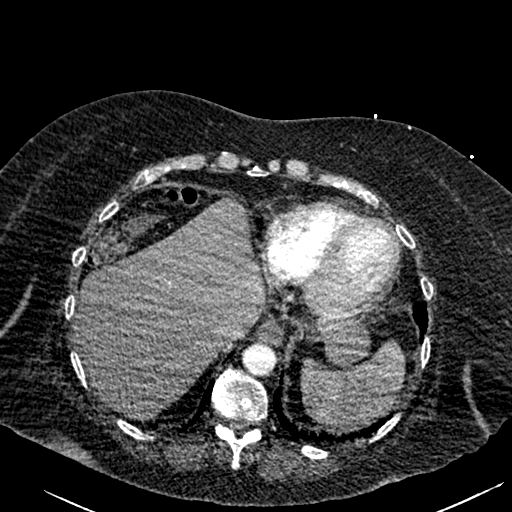
[im 44/263  lung]
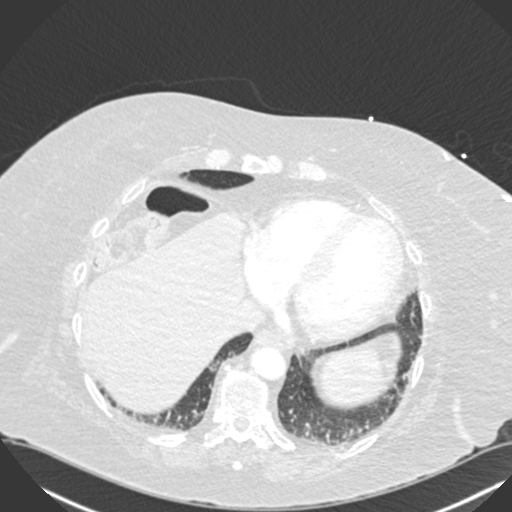
[im 59/263  mediastinal]
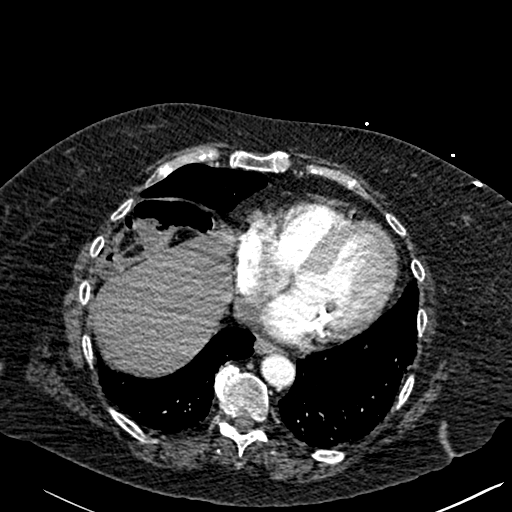
[im 73/263  lung]
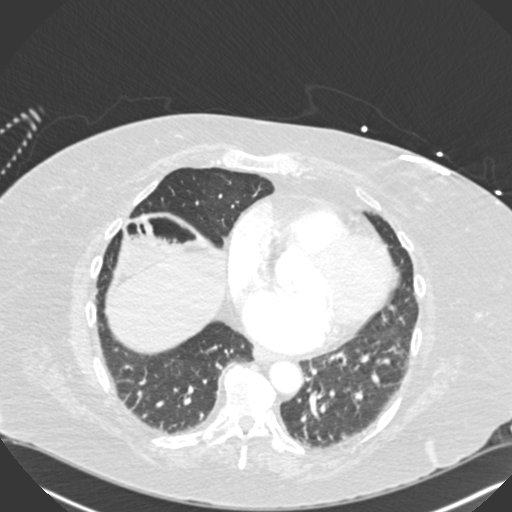
[im 88/263  mediastinal]
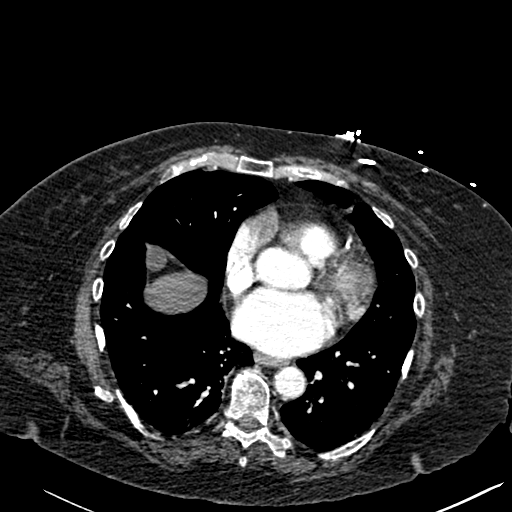
[im 102/263  lung]
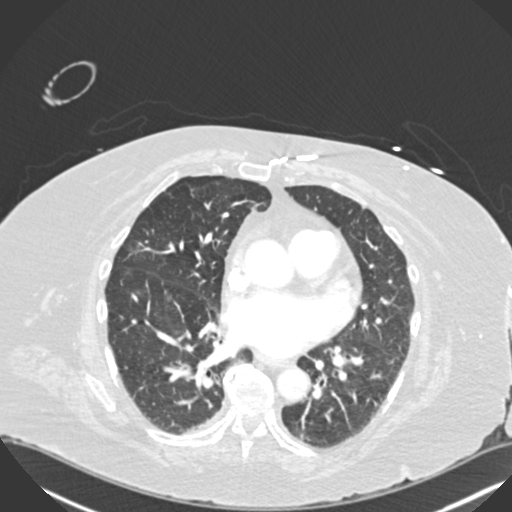
[im 117/263  mediastinal]
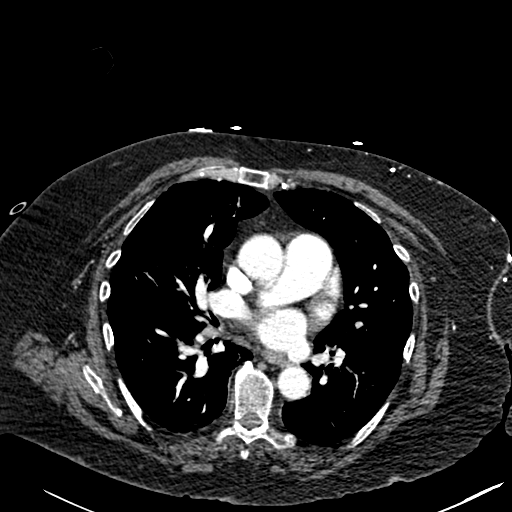
[im 132/263  lung]
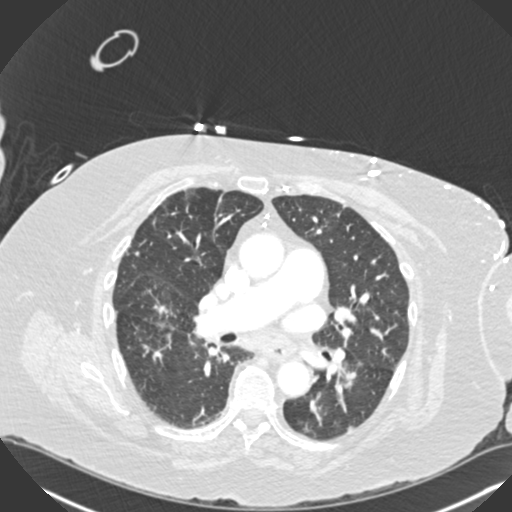
[im 146/263  mediastinal]
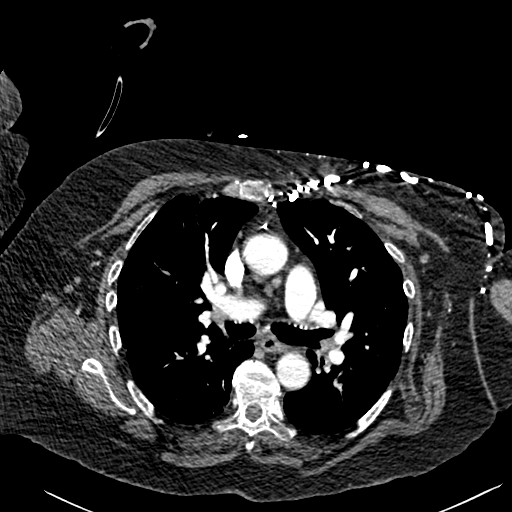
[im 161/263  lung]
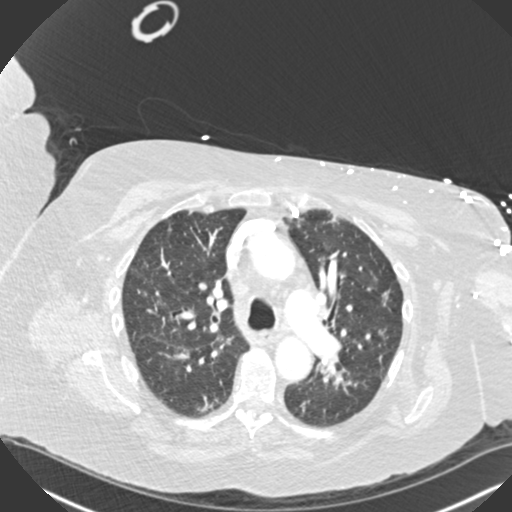
[im 175/263  mediastinal]
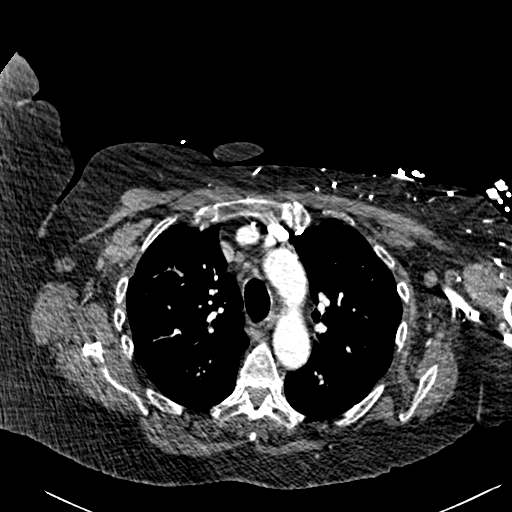
[im 190/263  lung]
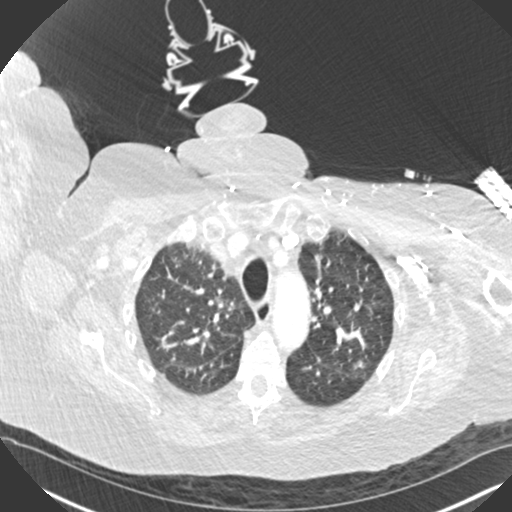
[im 204/263  mediastinal]
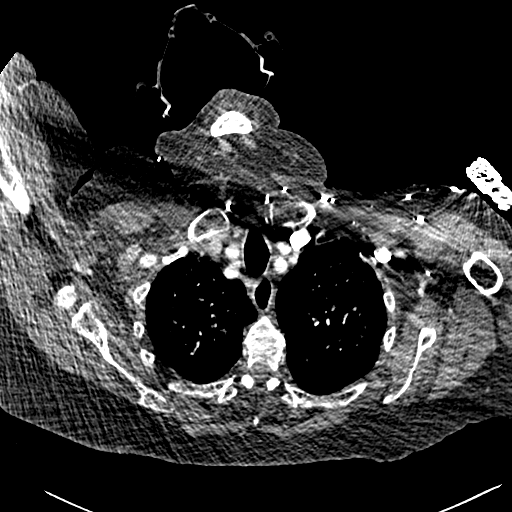
[im 219/263  lung]
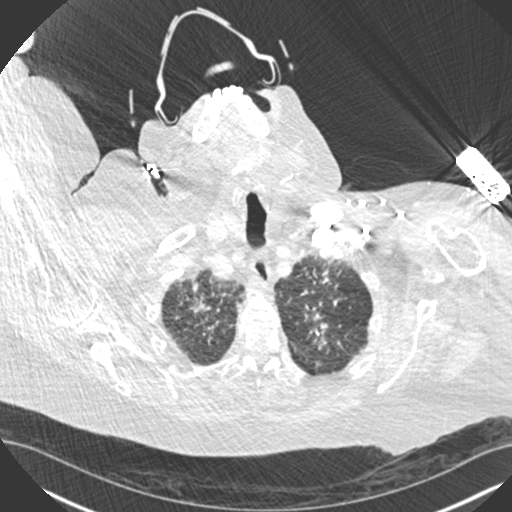
[im 233/263  mediastinal]
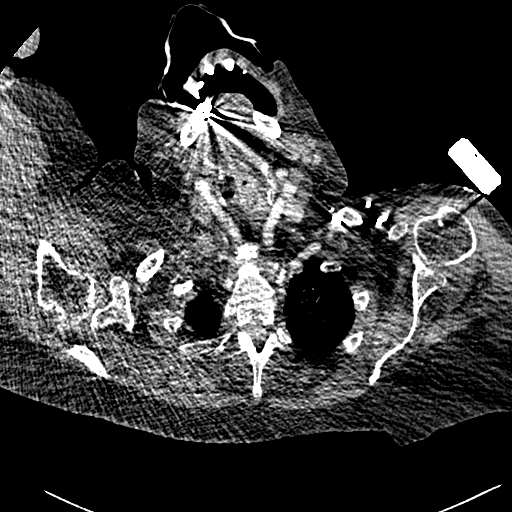
[im 248/263  lung]
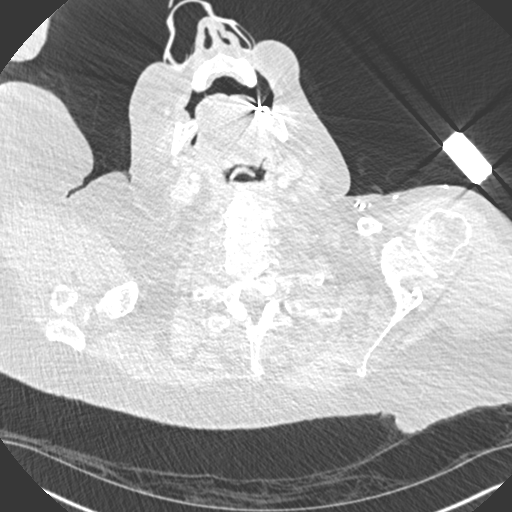

[Series 9: pe coronal mpr · coronal · 0.53mm/px · 1 of 137 slices shown]
[im 69/137  mediastinal]
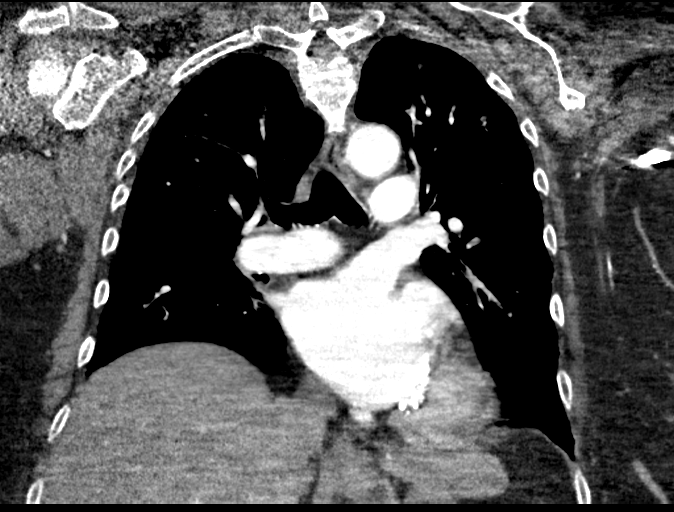

[18 of 36 positions shown; findings below may reference images not displayed]

FINDINGS: Cardiovascular: Heart is borderline in size. Aorta is normal
caliber. No filling defects in the pulmonary arteries to suggest
pulmonary emboli. Scattered coronary artery and aortic
calcifications.

Mediastinum/Nodes: Borderline sized mediastinal lymph nodes. No
hilar or axillary adenopathy.

Lungs/Pleura: Scattered ground-glass airspace opacities within the
lungs. Favor inflammatory or infectious pneumonitis. No effusions.

Upper Abdomen: Imaging into the upper abdomen shows no acute
findings.

Musculoskeletal: There are collateral venous channels throughout the
left chest wall suggesting central venous stenosis, not visible by
CT. Chest wall soft tissues otherwise unremarkable. No acute bony
abnormality.

Review of the MIP images confirms the above findings.
IMPRESSION: No evidence of pulmonary embolus.

Patchy bilateral ground-glass opacities in the lungs, most
pronounced in the upper lobes. Favor infectious or inflammatory
pneumonitis.

Borderline heart size.

Borderline scattered mediastinal lymph nodes, likely reactive.

Coronary artery disease.

Aortic Atherosclerosis ([id number]).

## 2020-11-02 IMAGING — DX DG CHEST 1V PORT
1 series · 1 of 1 positions shown · non-contrast
Comparison: Prior chest x-ray 11/04/2018

CLINICAL DATA: 72-year-old female with shortness of breath.
Recently admitted for flu and pneumonia

EXAM:
PORTABLE CHEST 1 VIEW

[chest]
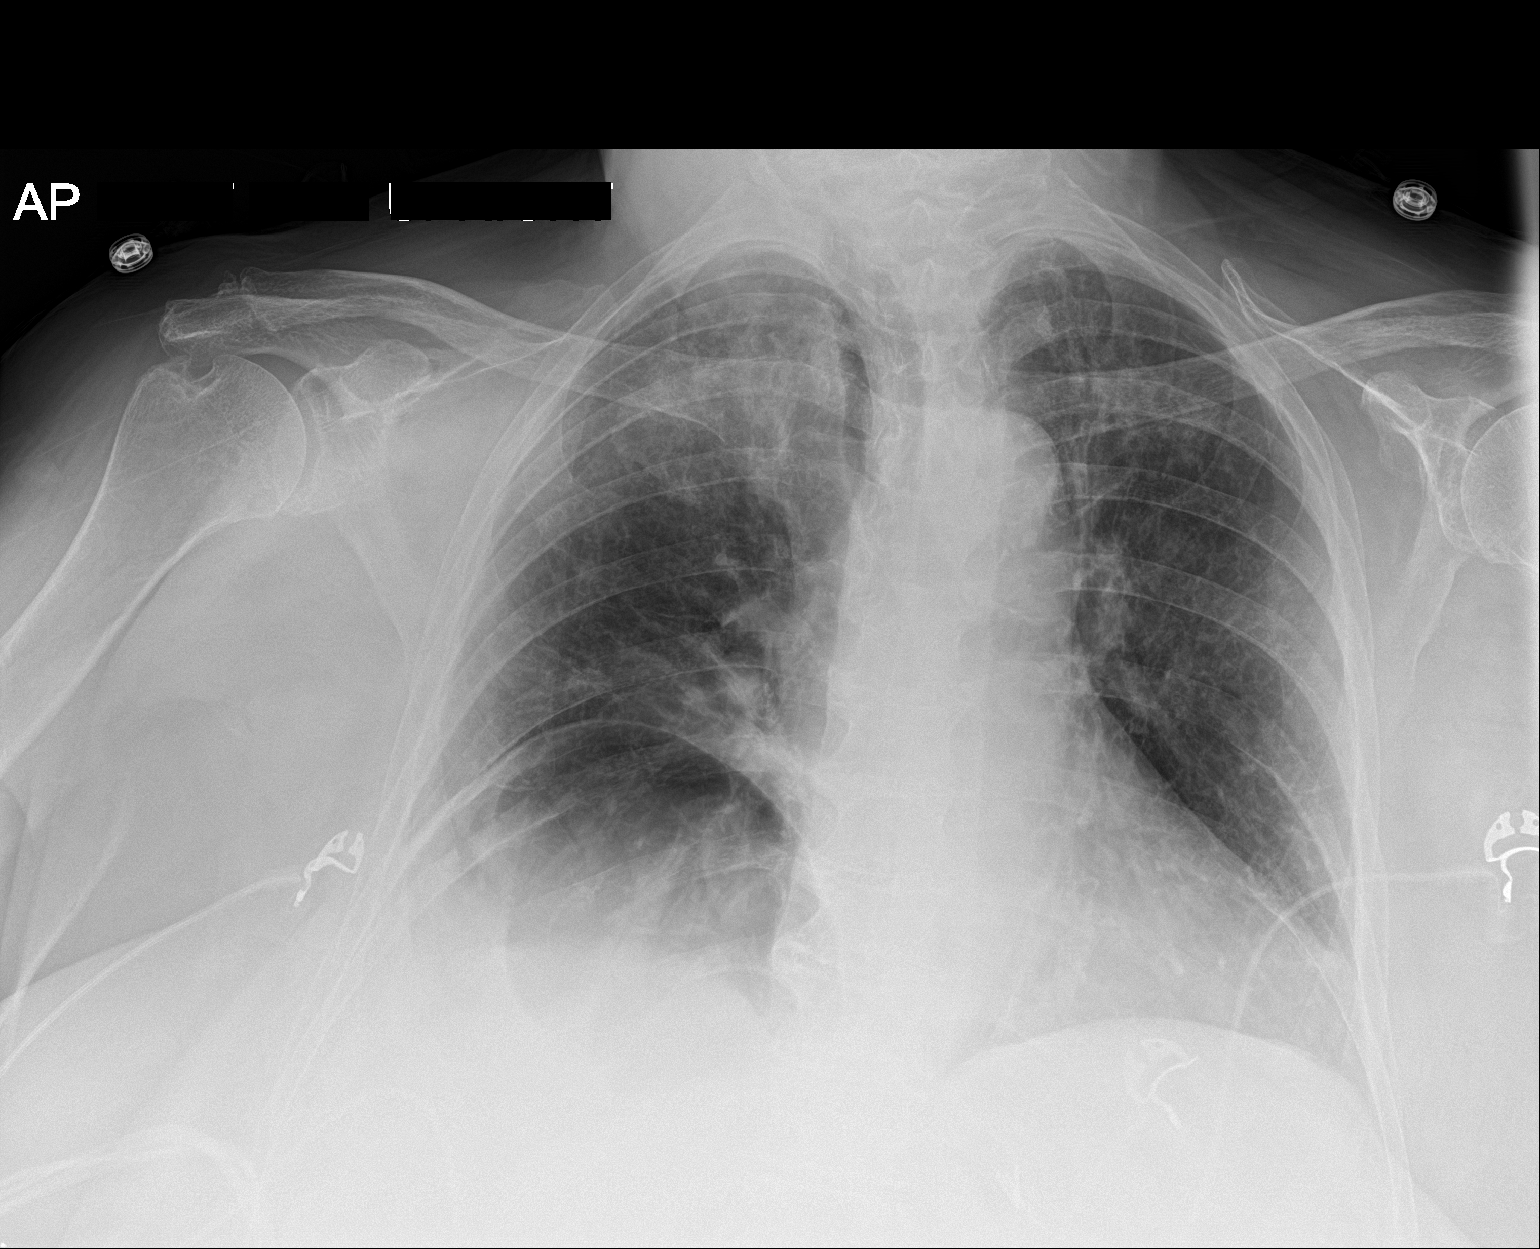

[1 of 1 positions shown; findings below may reference images not displayed]

FINDINGS: Cardiac and mediastinal contours are within normal limits. Stable
elevation of the right hemidiaphragm with colonic interposition.
Overall, inspiratory volumes are slightly lower than previously
seen. There is increased patchy airspace opacity in the bilateral
lung apices worse on the right than the left. Chronic bronchitic
changes and interstitial prominence are similar compared to prior.
No acute osseous abnormality.
IMPRESSION: 1. Slightly increased patchy airspace opacities in the right greater
than left lung apices. There are areas of airspace infiltrate on the
prior CT scan from 11/04/2018. This may represent progression of
multifocal pneumonia versus residual scarring.
2. Slightly lower inspiratory volumes compared to prior imaging.
3. Persistent elevation of the right hemidiaphragm with colonic
interposition.
